# Patient Record
Sex: Male | Born: 1950
Health system: Southern US, Community
[De-identification: ages and names within clinical notes are randomized; demographics above are authoritative.]

## PROBLEM LIST (undated history)

## (undated) DIAGNOSIS — Z972 Presence of dental prosthetic device (complete) (partial): Secondary | ICD-10-CM

## (undated) DIAGNOSIS — R112 Nausea with vomiting, unspecified: Secondary | ICD-10-CM

## (undated) DIAGNOSIS — E669 Obesity, unspecified: Secondary | ICD-10-CM

## (undated) DIAGNOSIS — R972 Elevated prostate specific antigen [PSA]: Secondary | ICD-10-CM

## (undated) DIAGNOSIS — R519 Headache, unspecified: Secondary | ICD-10-CM

## (undated) DIAGNOSIS — F419 Anxiety disorder, unspecified: Secondary | ICD-10-CM

## (undated) DIAGNOSIS — D509 Iron deficiency anemia, unspecified: Secondary | ICD-10-CM

## (undated) DIAGNOSIS — E039 Hypothyroidism, unspecified: Secondary | ICD-10-CM

## (undated) DIAGNOSIS — M1990 Unspecified osteoarthritis, unspecified site: Secondary | ICD-10-CM

## (undated) DIAGNOSIS — K449 Diaphragmatic hernia without obstruction or gangrene: Secondary | ICD-10-CM

## (undated) DIAGNOSIS — Z9889 Other specified postprocedural states: Secondary | ICD-10-CM

## (undated) DIAGNOSIS — R51 Headache: Secondary | ICD-10-CM

## (undated) DIAGNOSIS — I480 Paroxysmal atrial fibrillation: Secondary | ICD-10-CM

## (undated) DIAGNOSIS — I10 Essential (primary) hypertension: Secondary | ICD-10-CM

## (undated) DIAGNOSIS — E785 Hyperlipidemia, unspecified: Secondary | ICD-10-CM

## (undated) DIAGNOSIS — I499 Cardiac arrhythmia, unspecified: Secondary | ICD-10-CM

## (undated) HISTORY — DX: Hypothyroidism, unspecified: E03.9

## (undated) HISTORY — DX: Obesity, unspecified: E66.9

## (undated) HISTORY — DX: Anxiety disorder, unspecified: F41.9

## (undated) HISTORY — DX: Iron deficiency anemia, unspecified: D50.9

## (undated) HISTORY — PX: KNEE ARTHROSCOPY: SHX127

## (undated) HISTORY — PX: COLONOSCOPY WITH ESOPHAGOGASTRODUODENOSCOPY (EGD): SHX5779

## (undated) HISTORY — PX: MULTIPLE TOOTH EXTRACTIONS: SHX2053

## (undated) HISTORY — DX: Diaphragmatic hernia without obstruction or gangrene: K44.9

## (undated) HISTORY — DX: Paroxysmal atrial fibrillation: I48.0

## (undated) HISTORY — PX: JOINT REPLACEMENT: SHX530

## (undated) HISTORY — DX: Hyperlipidemia, unspecified: E78.5

## (undated) HISTORY — DX: Essential (primary) hypertension: I10

## (undated) HISTORY — PX: PROSTATE BIOPSY: SHX241

## (undated) HISTORY — PX: LUNG BIOPSY: SHX232

---

## 2003-03-18 ENCOUNTER — Encounter: Payer: Self-pay | Admitting: Internal Medicine

## 2003-03-18 ENCOUNTER — Ambulatory Visit (HOSPITAL_COMMUNITY): Admission: RE | Admit: 2003-03-18 | Discharge: 2003-03-18 | Payer: Self-pay | Admitting: Internal Medicine

## 2004-03-01 ENCOUNTER — Ambulatory Visit (HOSPITAL_COMMUNITY): Admission: RE | Admit: 2004-03-01 | Discharge: 2004-03-01 | Payer: Self-pay | Admitting: Gastroenterology

## 2004-03-01 LAB — HM COLONOSCOPY

## 2010-08-09 LAB — FECAL OCCULT BLOOD, GUAIAC: Fecal Occult Blood: NEGATIVE

## 2011-01-31 ENCOUNTER — Encounter: Payer: Self-pay | Admitting: Family Medicine

## 2011-01-31 DIAGNOSIS — D649 Anemia, unspecified: Secondary | ICD-10-CM

## 2011-01-31 DIAGNOSIS — E785 Hyperlipidemia, unspecified: Secondary | ICD-10-CM

## 2011-01-31 DIAGNOSIS — I10 Essential (primary) hypertension: Secondary | ICD-10-CM

## 2011-01-31 DIAGNOSIS — N400 Enlarged prostate without lower urinary tract symptoms: Secondary | ICD-10-CM | POA: Insufficient documentation

## 2011-01-31 DIAGNOSIS — R5381 Other malaise: Secondary | ICD-10-CM | POA: Insufficient documentation

## 2011-01-31 DIAGNOSIS — K449 Diaphragmatic hernia without obstruction or gangrene: Secondary | ICD-10-CM

## 2011-01-31 DIAGNOSIS — I776 Arteritis, unspecified: Secondary | ICD-10-CM

## 2011-01-31 DIAGNOSIS — E669 Obesity, unspecified: Secondary | ICD-10-CM

## 2011-01-31 DIAGNOSIS — C61 Malignant neoplasm of prostate: Secondary | ICD-10-CM | POA: Insufficient documentation

## 2011-01-31 DIAGNOSIS — E039 Hypothyroidism, unspecified: Secondary | ICD-10-CM

## 2011-01-31 DIAGNOSIS — D509 Iron deficiency anemia, unspecified: Secondary | ICD-10-CM

## 2011-01-31 NOTE — Progress Notes (Unsigned)
  Subjective:    Patient ID: Troy Acosta, male    DOB: 12/24/1950, 60 y.o.   MRN: [id number]  Hypertension This is a recurrent problem. The current episode started more than 1 year ago. The problem has been gradually worsening since onset. The problem is uncontrolled. Associated symptoms include sweats. Pertinent negatives include no anxiety, blurred vision, chest pain, headaches, malaise/fatigue, neck pain, orthopnea, palpitations, peripheral edema, PND or shortness of breath. There are no associated agents to hypertension. Risk factors for coronary artery disease include dyslipidemia, male gender, sedentary lifestyle and family history. Past treatments include angiotensin blockers. The current treatment provides no improvement. There are no compliance problems.  There is no history of angina, kidney disease, CAD/MI, CVA, heart failure, left ventricular hypertrophy, PVD, renovascular disease, retinopathy or a thyroid problem. There is no history of chronic renal disease, coarctation of the aorta, hyperaldosteronism, hypercortisolism, hyperparathyroidism, a hypertension causing med, pheochromocytoma or sleep apnea.      Review of Systems  Constitutional: Negative for malaise/fatigue.  HENT: Negative for neck pain.   Eyes: Negative for blurred vision.  Respiratory: Negative for shortness of breath.   Cardiovascular: Negative for chest pain, palpitations, orthopnea and PND.  Neurological: Negative for headaches.       Objective:   Physical Exam  Constitutional: He is oriented to person, place, and time. He appears well-developed and well-nourished.  HENT:  Head: Normocephalic.  Nose: Nose normal.  Mouth/Throat: Oropharynx is clear and moist.  Eyes: Conjunctivae are normal.  Neck: Neck supple. No JVD present. No tracheal deviation present. No thyromegaly present.  Cardiovascular: Normal rate, regular rhythm, normal heart sounds and intact distal pulses.  Exam reveals no gallop and no friction  rub.   No murmur heard. Pulmonary/Chest: Effort normal and breath sounds normal. No stridor.  Abdominal: Soft. Bowel sounds are normal. He exhibits no mass. There is no tenderness. There is no guarding.  Musculoskeletal: Normal range of motion.  Lymphadenopathy:    He has no cervical adenopathy.  Neurological: He is alert and oriented to person, place, and time.  Skin: Skin is warm and dry.  Psychiatric: He has a normal mood and affect. His behavior is normal.          Assessment & Plan:

## 2011-01-31 NOTE — Patient Instructions (Signed)
Monitor bps and review them in 2 weeks

## 2013-09-10 ENCOUNTER — Ambulatory Visit (INDEPENDENT_AMBULATORY_CARE_PROVIDER_SITE_OTHER): Payer: BC Managed Care – PPO | Admitting: Family Medicine

## 2013-09-10 ENCOUNTER — Encounter (INDEPENDENT_AMBULATORY_CARE_PROVIDER_SITE_OTHER): Payer: Self-pay

## 2013-09-10 ENCOUNTER — Encounter: Payer: Self-pay | Admitting: Family Medicine

## 2013-09-10 VITALS — BP 146/85 | HR 62 | Temp 99.4°F | Ht 72.0 in | Wt 240.0 lb

## 2013-09-10 DIAGNOSIS — D509 Iron deficiency anemia, unspecified: Secondary | ICD-10-CM | POA: Insufficient documentation

## 2013-09-10 DIAGNOSIS — E039 Hypothyroidism, unspecified: Secondary | ICD-10-CM

## 2013-09-10 DIAGNOSIS — Z0000 Encounter for general adult medical examination without abnormal findings: Secondary | ICD-10-CM

## 2013-09-10 DIAGNOSIS — E785 Hyperlipidemia, unspecified: Secondary | ICD-10-CM

## 2013-09-10 DIAGNOSIS — I10 Essential (primary) hypertension: Secondary | ICD-10-CM

## 2013-09-10 LAB — POCT CBC
Lymph, poc: 1.8 (ref 0.6–3.4)
MCH, POC: 28.2 pg (ref 27–31.2)
MCHC: 31.7 g/dL — AB (ref 31.8–35.4)
MCV: 88.9 fL (ref 80–97)
Platelet Count, POC: 232 10*3/uL (ref 142–424)
RDW, POC: 13.3 %
WBC: 9.4 10*3/uL (ref 4.6–10.2)

## 2013-09-10 LAB — POCT URINALYSIS DIPSTICK
Blood, UA: NEGATIVE
Nitrite, UA: NEGATIVE
Urobilinogen, UA: NEGATIVE
pH, UA: 6

## 2013-09-10 LAB — POCT UA - MICROSCOPIC ONLY
Casts, Ur, LPF, POC: NEGATIVE
Crystals, Ur, HPF, POC: NEGATIVE
Yeast, UA: NEGATIVE

## 2013-09-10 MED ORDER — ROSUVASTATIN CALCIUM 10 MG PO TABS: 10.0000 mg | ORAL_TABLET | Freq: Every day | ORAL | Status: DC

## 2013-09-10 MED ORDER — HYDROCHLOROTHIAZIDE 25 MG PO TABS: 25.0000 mg | ORAL_TABLET | Freq: Every day | ORAL | Status: DC

## 2013-09-10 MED ORDER — POLYSACCHARIDE IRON COMPLEX 150 MG PO CAPS: 150.0000 mg | ORAL_CAPSULE | Freq: Every day | ORAL | Status: DC

## 2013-09-10 MED ORDER — OLMESARTAN MEDOXOMIL 40 MG PO TABS: 40.0000 mg | ORAL_TABLET | Freq: Every day | ORAL | Status: DC

## 2013-09-10 MED ORDER — LEVOTHYROXINE SODIUM 175 MCG PO TABS: 175.0000 ug | ORAL_TABLET | Freq: Every day | ORAL | Status: DC

## 2013-09-10 NOTE — Progress Notes (Signed)
Subjective:    Patient ID: Troy Acosta, male    DOB: [birth year], 62 y.o.   MRN: [id number]  HPI Patient here today for annual physical and follow up on chronic medical problems. Patient has been doing well other than some knee pain. He brings in outside blood pressure readings which are running in the 130s over the 70s. He also brings in outside lab work and specifically all this lab work his last PSA a year ago was 1.4 and this time it is 1.1. We will do additional lab work that was not done through his workplace.     Patient Active Problem List   Diagnosis Date Noted  . Diaphragmatic hernia without mention of obstruction or gangrene   . Unspecified hypothyroidism   . Malaise and fatigue   . Arteritis, unspecified   . Iron deficiency anemia, unspecified   . Obesity   . Hyperplasia of prostate   . Essential hypertension, benign   . Other and unspecified hyperlipidemia    Outpatient Encounter Prescriptions as of 09/10/2013  Medication Sig  . aspirin 81 MG EC tablet Take 81 mg by mouth daily.    . Cholecalciferol (VITAMIN D3) 1000 UNITS CAPS Take 2 capsules by mouth daily.   . hydrochlorothiazide (HYDRODIURIL) 25 MG tablet Take 25 mg by mouth daily. As directed  . iron polysaccharides (NIFEREX) 150 MG capsule Take 150 mg by mouth daily.    Marland Kitchen levothyroxine (SYNTHROID, LEVOTHROID) 175 MCG tablet Take 175 mcg by mouth daily.    Marland Kitchen olmesartan (BENICAR) 40 MG tablet Take 40 mg by mouth daily.    . Omega-3 Fatty Acids (FISH OIL) 1000 MG CAPS Take 1 capsule by mouth daily.    . rosuvastatin (CRESTOR) 10 MG tablet Take 10 mg by mouth daily.      Review of Systems  Constitutional: Negative.   HENT: Negative.   Eyes: Negative.   Respiratory: Negative.   Cardiovascular: Negative.   Gastrointestinal: Negative.   Endocrine: Negative.   Genitourinary: Negative.   Musculoskeletal: Positive for arthralgias (right knee pain (had injection in the past)).  Skin: Negative.     Allergic/Immunologic: Negative.   Neurological: Negative.   Hematological: Negative.   Psychiatric/Behavioral: Negative.        Objective:   Physical Exam  Nursing note and vitals reviewed. Constitutional: He is oriented to person, place, and time. He appears well-developed and well-nourished. No distress.  HENT:  Head: Normocephalic and atraumatic.  Right Ear: External ear normal.  Left Ear: External ear normal.  Mouth/Throat: Oropharynx is clear and moist. No oropharyngeal exudate.  Nasal congestion  Eyes: Conjunctivae and EOM are normal. Pupils are equal, round, and reactive to light. Right eye exhibits no discharge. Left eye exhibits no discharge. No scleral icterus.  Neck: Normal range of motion. Neck supple. No tracheal deviation present. No thyromegaly present.  No carotid bruits  Cardiovascular: Normal rate, regular rhythm, normal heart sounds and intact distal pulses.  Exam reveals no gallop and no friction rub.   No murmur heard. At 72 per minute  Pulmonary/Chest: Effort normal and breath sounds normal. No respiratory distress. He has no wheezes. He has no rales. He exhibits no tenderness.  No axillary adenopathy  Abdominal: Soft. Bowel sounds are normal. He exhibits no mass. There is no tenderness. There is no rebound and no guarding.  Obesity present. No inguinal nodes  Genitourinary: Rectum normal and penis normal. No penile tenderness.  Prostate is slightly enlarged no firm lumps and no rectal  masses. There was no hernia present and external genitalia was within normal limits  Musculoskeletal: Normal range of motion. He exhibits no edema and no tenderness.  Lymphadenopathy:    He has no cervical adenopathy.  Neurological: He is alert and oriented to person, place, and time. He has normal reflexes. No cranial nerve deficit.  Skin: Skin is warm and dry. No rash noted. No erythema. No pallor.  Psychiatric: He has a normal mood and affect. His behavior is normal.  Judgment and thought content normal.   BP 146/85  Pulse 62  Temp(Src) 99.4 F (37.4 C) (Oral)  Ht 6' (1.829 m)  Wt 240 lb (108.863 kg)  BMI 32.54 kg/m2        Assessment & Plan:  1. Essential hypertension, benign - BMP8+EGFR - Hepatic function panel  2. Unspecified hypothyroidism - Thyroid Panel With TSH  3. Other and unspecified hyperlipidemia  4. Annual physical exam - POCT urinalysis dipstick - POCT UA - Microscopic Only - POCT CBC - Vit D  25 hydroxy (rtn osteoporosis monitoring) - BMP8+EGFR - Hepatic function panel - Thyroid Panel With TSH  5. Anemia, iron deficiency   Meds ordered this encounter  Medications  . hydrochlorothiazide (HYDRODIURIL) 25 MG tablet    Sig: Take 25 mg by mouth daily. As directed   Patient Instructions  Continue current medications. Continue good therapeutic lifestyle changes which include good diet and exercise. Fall precautions discussed with patient. Schedule your flu vaccine if you haven't had it yet If you are over 18 years old - you may need Prevnar 13 or the adult Pneumonia vaccine. Check with your insurance regarding getting the Prevnar vaccine Call [phone] and arrange a visit with the orthopedic surgeon at this office in 8 day--Dr. Darrelyn Hillock Work on aggressively losing weight by watching diet and getting more exercise     Nyra Capes MD

## 2013-09-10 NOTE — Addendum Note (Signed)
Addended by: Magdalene River on: 09/10/2013 11:48 AM   Modules accepted: Orders

## 2013-09-10 NOTE — Patient Instructions (Addendum)
Continue current medications. Continue good therapeutic lifestyle changes which include good diet and exercise. Fall precautions discussed with patient. Schedule your flu vaccine if you haven't had it yet If you are over 60 years old - you may need Prevnar 13 or the adult Pneumonia vaccine. Check with your insurance regarding getting the Prevnar vaccine Call [phone] and arrange a visit with the orthopedic surgeon at this office in 8 day--Dr. Darrelyn Hillock Work on aggressively losing weight by watching diet and getting more exercise

## 2013-09-11 LAB — BMP8+EGFR
BUN/Creatinine Ratio: 21 — ABNORMAL HIGH (ref 9–20)
BUN: 19 mg/dL (ref 6–24)
Chloride: 102 mmol/L (ref 97–108)
GFR calc Af Amer: 108 mL/min/{1.73_m2} (ref >59)
Potassium: 4.4 mmol/L (ref 3.5–5.2)

## 2013-09-11 LAB — THYROID PANEL WITH TSH: T4, Total: 9.4 ug/dL (ref 4.5–12.0)

## 2013-09-11 LAB — HEPATIC FUNCTION PANEL
AST: 16 IU/L (ref 0–40)
Albumin: 4.5 g/dL (ref 3.5–5.5)
Alkaline Phosphatase: 64 IU/L (ref 39–117)

## 2013-09-17 ENCOUNTER — Telehealth: Payer: Self-pay | Admitting: *Deleted

## 2013-09-17 NOTE — Telephone Encounter (Signed)
LM TO GO OVER LABS 

## 2013-09-17 NOTE — Telephone Encounter (Signed)
Pt aware of labs  

## 2013-09-18 ENCOUNTER — Other Ambulatory Visit: Payer: Self-pay | Admitting: Orthopedic Surgery

## 2013-09-18 ENCOUNTER — Ambulatory Visit (INDEPENDENT_AMBULATORY_CARE_PROVIDER_SITE_OTHER): Payer: BC Managed Care – PPO

## 2013-09-18 DIAGNOSIS — R52 Pain, unspecified: Secondary | ICD-10-CM

## 2013-09-18 DIAGNOSIS — M25569 Pain in unspecified knee: Secondary | ICD-10-CM

## 2013-10-01 ENCOUNTER — Other Ambulatory Visit: Payer: Self-pay | Admitting: Family Medicine

## 2014-09-17 ENCOUNTER — Encounter: Payer: BC Managed Care – PPO | Admitting: Family Medicine

## 2014-09-22 ENCOUNTER — Ambulatory Visit (INDEPENDENT_AMBULATORY_CARE_PROVIDER_SITE_OTHER): Payer: BC Managed Care – PPO | Admitting: Family Medicine

## 2014-09-22 ENCOUNTER — Encounter (INDEPENDENT_AMBULATORY_CARE_PROVIDER_SITE_OTHER): Payer: Self-pay

## 2014-09-22 ENCOUNTER — Other Ambulatory Visit: Payer: Self-pay | Admitting: Family Medicine

## 2014-09-22 ENCOUNTER — Ambulatory Visit (INDEPENDENT_AMBULATORY_CARE_PROVIDER_SITE_OTHER): Payer: BC Managed Care – PPO

## 2014-09-22 ENCOUNTER — Encounter: Payer: Self-pay | Admitting: Family Medicine

## 2014-09-22 VITALS — BP 159/86 | HR 61 | Temp 97.4°F | Ht 72.0 in | Wt 242.0 lb

## 2014-09-22 DIAGNOSIS — Z0000 Encounter for general adult medical examination without abnormal findings: Secondary | ICD-10-CM

## 2014-09-22 DIAGNOSIS — D509 Iron deficiency anemia, unspecified: Secondary | ICD-10-CM

## 2014-09-22 DIAGNOSIS — E669 Obesity, unspecified: Secondary | ICD-10-CM

## 2014-09-22 DIAGNOSIS — Z1211 Encounter for screening for malignant neoplasm of colon: Secondary | ICD-10-CM

## 2014-09-22 DIAGNOSIS — I10 Essential (primary) hypertension: Secondary | ICD-10-CM

## 2014-09-22 DIAGNOSIS — E039 Hypothyroidism, unspecified: Secondary | ICD-10-CM

## 2014-09-22 DIAGNOSIS — M1711 Unilateral primary osteoarthritis, right knee: Secondary | ICD-10-CM

## 2014-09-22 LAB — POCT UA - MICROSCOPIC ONLY
BACTERIA, U MICROSCOPIC: NEGATIVE
CRYSTALS, UR, HPF, POC: NEGATIVE
Casts, Ur, LPF, POC: NEGATIVE
Mucus, UA: NEGATIVE
WBC, Ur, HPF, POC: NEGATIVE
Yeast, UA: NEGATIVE

## 2014-09-22 LAB — POCT URINALYSIS DIPSTICK
Bilirubin, UA: NEGATIVE
GLUCOSE UA: NEGATIVE
Ketones, UA: NEGATIVE
LEUKOCYTES UA: NEGATIVE
Nitrite, UA: NEGATIVE
PROTEIN UA: NEGATIVE
SPEC GRAV UA: 1.01
Urobilinogen, UA: NEGATIVE
pH, UA: 6.5

## 2014-09-22 MED ORDER — POLYSACCHARIDE IRON COMPLEX 150 MG PO CAPS: 150.0000 mg | ORAL_CAPSULE | Freq: Every day | ORAL | Status: DC

## 2014-09-22 MED ORDER — OLMESARTAN MEDOXOMIL 40 MG PO TABS: 40.0000 mg | ORAL_TABLET | Freq: Every day | ORAL | Status: DC

## 2014-09-22 MED ORDER — LEVOTHYROXINE SODIUM 150 MCG PO TABS: 150.0000 ug | ORAL_TABLET | Freq: Every day | ORAL | Status: DC

## 2014-09-22 MED ORDER — ROSUVASTATIN CALCIUM 10 MG PO TABS: 10.0000 mg | ORAL_TABLET | Freq: Every day | ORAL | Status: DC

## 2014-09-22 MED ORDER — HYDROCHLOROTHIAZIDE 25 MG PO TABS: 25.0000 mg | ORAL_TABLET | Freq: Every day | ORAL | Status: DC

## 2014-09-22 NOTE — Patient Instructions (Addendum)
Continue current medications. Continue good therapeutic lifestyle changes which include good diet and exercise. Fall precautions discussed with patient. If an FOBT was given today- please return it to our front desk. If you are over 61 years old - you may need Prevnar 40 or the adult Pneumonia vaccine.  Flu Shots will be available at our office starting mid- September. Please call and schedule a FLU CLINIC APPOINTMENT.   Continue to work on losing weight Stay active, drink plenty of water and if you use any.supplements, use Stevia. We will call you with a thyroid profile results once those results are available We will try to arrange a colonoscopy with Dr. Zenovia Jarred Continue to follow-up with Dr. Veverly Fells regarding your knee arthritis Continue to monitor blood pressures at home Decrease sodium intake

## 2014-09-22 NOTE — Progress Notes (Signed)
Subjective:    Patient ID: Troy Acosta, male    DOB: 08/31/1951, 63 y.o.   MRN: [id number]  HPI Patient is here today for annual wellness exam and follow up of chronic medical problems. The patient is doing well. He brings in lab work from the outside. This was reviewed with the patient and will be scanned into the record. All the numbers look good after reviewing these. He would like for his thyroid tests to be repeated because this medication has been adjusted and he needs to know that it's now stable. He would like all his prescriptions rewritten and this will be done for him.         Patient Active Problem List   Diagnosis Date Noted  . Anemia, iron deficiency 09/10/2013  . Diaphragmatic hernia without mention of obstruction or gangrene   . Hypothyroidism   . Malaise and fatigue   . Arteritis, unspecified   . Iron deficiency anemia, unspecified   . Obesity   . BPH (benign prostatic hyperplasia)   . Essential hypertension, benign   . Hyperlipidemia    Outpatient Encounter Prescriptions as of 09/22/2014  Medication Sig  . aspirin 81 MG EC tablet Take 81 mg by mouth daily.    . Cholecalciferol (VITAMIN D3) 1000 UNITS CAPS Take 2 capsules by mouth daily.   . hydrochlorothiazide (HYDRODIURIL) 25 MG tablet Take 1 tablet (25 mg total) by mouth daily. As directed  . iron polysaccharides (NIFEREX) 150 MG capsule Take 1 capsule (150 mg total) by mouth daily.  Marland Kitchen levothyroxine (SYNTHROID, LEVOTHROID) 150 MCG tablet Take 150 mcg by mouth daily before breakfast.  . olmesartan (BENICAR) 40 MG tablet Take 1 tablet (40 mg total) by mouth daily.  . Omega-3 Fatty Acids (FISH OIL) 1000 MG CAPS Take 1 capsule by mouth daily.    . rosuvastatin (CRESTOR) 10 MG tablet Take 1 tablet (10 mg total) by mouth daily.  . [DISCONTINUED] levothyroxine (SYNTHROID, LEVOTHROID) 175 MCG tablet Take 1 tablet (175 mcg total) by mouth daily.    Review of Systems  Constitutional: Negative.   HENT: Negative.     Eyes: Negative.   Respiratory: Negative.   Cardiovascular: Negative.   Gastrointestinal: Negative.   Endocrine: Negative.   Genitourinary: Negative.   Musculoskeletal: Positive for arthralgias (right knee pain - seeing Dr Veverly Fells).  Skin: Negative.   Allergic/Immunologic: Negative.   Neurological: Negative.   Hematological: Negative.   Psychiatric/Behavioral: Negative.        Objective:   Physical Exam  Constitutional: He is oriented to person, place, and time. He appears well-developed and well-nourished. No distress.  Pleasant and relaxed and alert  HENT:  Head: Normocephalic and atraumatic.  Right Ear: External ear normal.  Left Ear: External ear normal.  Nose: Nose normal.  Mouth/Throat: Oropharynx is clear and moist. No oropharyngeal exudate.  Eyes: Conjunctivae and EOM are normal. Pupils are equal, round, and reactive to light. Right eye exhibits no discharge. Left eye exhibits no discharge. No scleral icterus.  Neck: Normal range of motion. Neck supple. No thyromegaly present.  Cardiovascular: Normal rate, regular rhythm, normal heart sounds and intact distal pulses.   No murmur heard. At 72/m  Pulmonary/Chest: Effort normal and breath sounds normal. No respiratory distress. He has no wheezes. He has no rales. He exhibits no tenderness.  No axillary adenopathy  Abdominal: Soft. Bowel sounds are normal. He exhibits no mass. There is no tenderness. There is no rebound and no guarding.  No masses or tenderness,  no inguinal adenopathy  Genitourinary: Rectum normal and penis normal.  The prostate is smooth and soft and slightly enlarged. There are no rectal masses. There are no inguinal hernias present. The external genitalia were within normal limits.  Musculoskeletal: Normal range of motion. He exhibits tenderness. He exhibits no edema.  Right knee tenderness and stiffness  Lymphadenopathy:    He has no cervical adenopathy.  Neurological: He is alert and oriented to  person, place, and time. He has normal reflexes. No cranial nerve deficit.  Skin: Skin is warm and dry. No rash noted. No erythema. No pallor.  Psychiatric: He has a normal mood and affect. His behavior is normal. Judgment and thought content normal.  Nursing note and vitals reviewed.  BP 159/86 mmHg  Pulse 61  Temp(Src) 97.4 F (36.3 C) (Oral)  Ht 6' (1.829 m)  Wt 242 lb (109.77 kg)  BMI 32.81 kg/m2  Repeat blood pressure 140/90 right arm sitting large cuff  [facility] reading (PRIMARY) by  Dr. Brunilda Payor x-ray--no active disease                                       Assessment & Plan:  1. Essential hypertension, benign -Continue to monitor blood pressures at home  2. Anemia, iron deficiency  3. Annual physical exam - POCT UA - Microscopic Only - POCT urinalysis dipstick - Urine culture  4. Screen for colon cancer - Ambulatory referral to Gastroenterology  5. Hypothyroidism, unspecified hypothyroidism type - Thyroid Panel With TSH  6. Primary osteoarthritis of right knee  7. Obesity  Patient Instructions  Continue current medications. Continue good therapeutic lifestyle changes which include good diet and exercise. Fall precautions discussed with patient. If an FOBT was given today- please return it to our front desk. If you are over 30 years old - you may need Prevnar 26 or the adult Pneumonia vaccine.  Flu Shots will be available at our office starting mid- September. Please call and schedule a FLU CLINIC APPOINTMENT.   Continue to work on losing weight Stay active, drink plenty of water and if you use any.supplements, use Stevia. We will call you with a thyroid profile results once those results are available We will try to arrange a colonoscopy with Dr. Zenovia Jarred Continue to follow-up with Dr. Veverly Fells regarding your knee arthritis Continue to monitor blood pressures at home Decrease sodium intake    Arrie Senate MD

## 2014-09-23 ENCOUNTER — Telehealth: Payer: Self-pay | Admitting: *Deleted

## 2014-09-23 DIAGNOSIS — R899 Unspecified abnormal finding in specimens from other organs, systems and tissues: Secondary | ICD-10-CM

## 2014-09-23 LAB — URINE CULTURE: ORGANISM ID, BACTERIA: NO GROWTH

## 2014-09-23 LAB — THYROID PANEL WITH TSH
Free Thyroxine Index: 2.8 (ref 1.2–4.9)
T3 UPTAKE RATIO: 29 % (ref 24–39)
T4 TOTAL: 9.8 ug/dL (ref 4.5–12.0)
TSH: 5.1 u[IU]/mL — ABNORMAL HIGH (ref 0.450–4.500)

## 2014-09-23 MED ORDER — LEVOTHYROXINE SODIUM 25 MCG PO TABS: ORAL_TABLET | ORAL | Status: DC

## 2014-09-23 NOTE — Telephone Encounter (Signed)
Patient aware of new medication to be added due to thyroid results.

## 2014-09-23 NOTE — Telephone Encounter (Signed)
Please call to discuss lab results.  Thyroid number is elevated.  Need to add more levothyroxine.  Script was sent in.  Will need to draw blood in 6 weeks to recheck level.

## 2014-09-23 NOTE — Addendum Note (Signed)
Addended by: Shelbie Ammons on: 09/23/2014 11:10 AM   Modules accepted: Orders

## 2014-09-24 ENCOUNTER — Telehealth: Payer: Self-pay | Admitting: *Deleted

## 2014-09-24 NOTE — Telephone Encounter (Signed)
Urine specimen was normal.

## 2014-10-12 ENCOUNTER — Other Ambulatory Visit: Payer: Self-pay | Admitting: Family Medicine

## 2014-10-19 ENCOUNTER — Telehealth: Payer: Self-pay | Admitting: Family Medicine

## 2014-10-19 NOTE — Telephone Encounter (Signed)
Okay to change per Strong notified

## 2014-12-24 ENCOUNTER — Ambulatory Visit (INDEPENDENT_AMBULATORY_CARE_PROVIDER_SITE_OTHER): Payer: BLUE CROSS/BLUE SHIELD | Admitting: Family Medicine

## 2014-12-24 ENCOUNTER — Encounter: Payer: Self-pay | Admitting: Family Medicine

## 2014-12-24 VITALS — BP 147/86 | HR 62 | Temp 97.6°F | Ht 72.0 in | Wt 247.0 lb

## 2014-12-24 DIAGNOSIS — Z8249 Family history of ischemic heart disease and other diseases of the circulatory system: Secondary | ICD-10-CM

## 2014-12-24 DIAGNOSIS — E039 Hypothyroidism, unspecified: Secondary | ICD-10-CM | POA: Diagnosis not present

## 2014-12-24 DIAGNOSIS — R0602 Shortness of breath: Secondary | ICD-10-CM | POA: Diagnosis not present

## 2014-12-24 DIAGNOSIS — E669 Obesity, unspecified: Secondary | ICD-10-CM | POA: Diagnosis not present

## 2014-12-24 LAB — POCT CBC
Granulocyte percent: 61.5 %G (ref 37–80)
HEMATOCRIT: 43.7 % (ref 43.5–53.7)
Hemoglobin: 13.8 g/dL — AB (ref 14.1–18.1)
LYMPH, POC: 2.5 (ref 0.6–3.4)
MCH: 27.8 pg (ref 27–31.2)
MCHC: 31.7 g/dL — AB (ref 31.8–35.4)
MCV: 87.9 fL (ref 80–97)
MPV: 5.5 fL (ref 0–99.8)
POC Granulocyte: 5.1 (ref 2–6.9)
POC LYMPH PERCENT: 30.6 %L (ref 10–50)
Platelet Count, POC: 241 10*3/uL (ref 142–424)
RBC: 4.97 M/uL (ref 4.69–6.13)
RDW, POC: 13.7 %
WBC: 8.3 10*3/uL (ref 4.6–10.2)

## 2014-12-24 NOTE — Patient Instructions (Signed)
The patient has agreed to work with the health stat nurse at work today with his weight He will also come by here and let us get his weight on a monthly basis for the next 3 months at least

## 2014-12-24 NOTE — Progress Notes (Signed)
Subjective:    Patient ID: Troy Acosta, male    DOB: 1951/07/14, 64 y.o.   MRN: [id number]  HPI Patient here today for shortness of breath. This only happens occasionally and usually is resolved by a cough.        Patient Active Problem List   Diagnosis Date Noted  . Anemia, iron deficiency 09/10/2013  . Diaphragmatic hernia without mention of obstruction or gangrene   . Hypothyroidism   . Malaise and fatigue   . Arteritis, unspecified   . Iron deficiency anemia, unspecified   . Obesity   . BPH (benign prostatic hyperplasia)   . Essential hypertension, benign   . Hyperlipidemia    Outpatient Encounter Prescriptions as of 12/24/2014  Medication Sig  . aspirin 81 MG EC tablet Take 81 mg by mouth daily.    . Cholecalciferol (VITAMIN D3) 1000 UNITS CAPS Take 2 capsules by mouth daily.   . hydrochlorothiazide (HYDRODIURIL) 25 MG tablet TAKE ONE TABLET BY MOUTH ONE TIME DAILY  . iron polysaccharides (NIFEREX) 150 MG capsule Take 1 capsule (150 mg total) by mouth daily.  Marland Kitchen levothyroxine (LEVOTHROID) 25 MCG tablet Take 0.5 pill (12.5 mcg) every AM with levothyroxine 150 mcg to total 162.38mg q day.  . levothyroxine (SYNTHROID, LEVOTHROID) 150 MCG tablet Take 1 tablet (150 mcg total) by mouth daily before breakfast.  . olmesartan (BENICAR) 40 MG tablet Take 1 tablet (40 mg total) by mouth daily.  . Omega-3 Fatty Acids (FISH OIL) 1000 MG CAPS Take 1 capsule by mouth daily.    . rosuvastatin (CRESTOR) 10 MG tablet Take 1 tablet (10 mg total) by mouth daily.    Review of Systems  Constitutional: Negative.   HENT: Negative.   Eyes: Negative.   Respiratory: Positive for shortness of breath.   Cardiovascular: Negative.   Gastrointestinal: Negative.   Endocrine: Negative.   Genitourinary: Negative.   Musculoskeletal: Negative.   Skin: Negative.   Allergic/Immunologic: Negative.   Neurological: Negative.   Hematological: Negative.   Psychiatric/Behavioral: Negative.          Objective:   Physical Exam  Constitutional: He is oriented to person, place, and time. He appears well-developed and well-nourished.  HENT:  Head: Normocephalic and atraumatic.  Right Ear: External ear normal.  Left Ear: External ear normal.  Nose: Nose normal.  Mouth/Throat: Oropharynx is clear and moist. No oropharyngeal exudate.  Eyes: Conjunctivae and EOM are normal. Pupils are equal, round, and reactive to light. Right eye exhibits no discharge. Left eye exhibits no discharge. No scleral icterus.  Neck: Normal range of motion. Neck supple. No thyromegaly present.  Cardiovascular: Normal rate, regular rhythm, normal heart sounds and intact distal pulses.  Exam reveals no gallop and no friction rub.   No murmur heard. At 60/m  Pulmonary/Chest: Effort normal and breath sounds normal. No respiratory distress. He has no wheezes. He has no rales. He exhibits no tenderness.  Abdominal: Soft. Bowel sounds are normal. He exhibits no mass. There is no tenderness. There is no rebound and no guarding.  Musculoskeletal: Normal range of motion. He exhibits no edema or tenderness.  Lymphadenopathy:    He has no cervical adenopathy.  Neurological: He is alert and oriented to person, place, and time. He has normal reflexes. No cranial nerve deficit.  Skin: Skin is warm and dry. Rash noted. No erythema. No pallor.  Dry rash on extensor surfaces of elbows  Psychiatric: He has a normal mood and affect. His behavior is normal. Judgment and  thought content normal.  Nursing note and vitals reviewed.  BP 147/86 mmHg  Pulse 62  Temp(Src) 97.6 F (36.4 C) (Oral)  Ht 6' (1.829 m)  Wt 247 lb (112.038 kg)  BMI 33.49 kg/m2  SpO2 96%  PaO2 96% Chest x-ray from December 15 was within normal limits      Assessment & Plan:  1. SOB (shortness of breath) -The patient continues to gain weight. Recently he said he weighed 254 and he's worked on getting it down to 247 pounds. Back in December it was 242 in  December 2014 it was 240. His BMI is 33.4. He understands the necessity for an aggressive weight loss plan. He needs to talk to the health stat nurse and arrange for her to work with him on his diet. - EKG 12-Lead - POCT CBC - Thyroid Panel With TSH - Ambulatory referral to Cardiology - BMP8+EGFR  2. Hypothyroidism, unspecified hypothyroidism type -The patient's thyroid was checked recently and it was within normal limits - Thyroid Panel With TSH - Ambulatory referral to Cardiology - BMP8+EGFR  3. Family history of heart disease -This is a risk factor for the patient and he needs to lose the weight - Ambulatory referral to Cardiology - BMP8+EGFR  4. Obesity -I asked the patient to work on this and he will schedule his visit with the help stat nurse at work and he will come by here monthly for weigh-in.  Patient Instructions  The patient has agreed to work with the health stat nurse at work today with his weight He will also come by here and let us get his weight on a monthly basis for the next 3 months at least   Arrie Senate MD

## 2014-12-25 LAB — NMR, LIPOPROFILE
CHOLESTEROL: 133 mg/dL (ref 100–199)
HDL Cholesterol by NMR: 39 mg/dL — ABNORMAL LOW (ref >39)
HDL Particle Number: 34.1 umol/L (ref >=30.5)
LDL PARTICLE NUMBER: 931 nmol/L (ref <1000)
LDL SIZE: 20.4 nm (ref >20.5)
LDL-C: 66 mg/dL (ref 0–99)
LP-IR Score: 73 — ABNORMAL HIGH (ref <=45)
Small LDL Particle Number: 567 nmol/L — ABNORMAL HIGH (ref <=527)
Triglycerides by NMR: 139 mg/dL (ref 0–149)

## 2014-12-25 LAB — BMP8+EGFR
BUN/Creatinine Ratio: 18 (ref 10–22)
BUN: 17 mg/dL (ref 8–27)
CALCIUM: 9.3 mg/dL (ref 8.6–10.2)
CHLORIDE: 98 mmol/L (ref 97–108)
CO2: 27 mmol/L (ref 18–29)
Creatinine, Ser: 0.96 mg/dL (ref 0.76–1.27)
GFR calc Af Amer: 98 mL/min/{1.73_m2} (ref >59)
GFR calc non Af Amer: 85 mL/min/{1.73_m2} (ref >59)
Glucose: 99 mg/dL (ref 65–99)
Potassium: 3.5 mmol/L (ref 3.5–5.2)
SODIUM: 142 mmol/L (ref 134–144)

## 2014-12-25 LAB — THYROID PANEL WITH TSH
Free Thyroxine Index: 2.5 (ref 1.2–4.9)
T3 Uptake Ratio: 27 % (ref 24–39)
T4, Total: 9.1 ug/dL (ref 4.5–12.0)
TSH: 2.49 u[IU]/mL (ref 0.450–4.500)

## 2014-12-31 ENCOUNTER — Telehealth: Payer: Self-pay | Admitting: *Deleted

## 2014-12-31 NOTE — Telephone Encounter (Signed)
Pt aware of lab results.  Will call back and make 3-4 month appt with [facility] after stress test.

## 2014-12-31 NOTE — Telephone Encounter (Signed)
Lm, call for results.

## 2014-12-31 NOTE — Telephone Encounter (Signed)
-----   Message from Chipper Herb, MD sent at 12/26/2014 12:00 PM EDT ----- The patient should continue with his current iron medication based on the results of the CBC. The thyroid tests were within normal limits. The blood sugar was good at 99. The creatinine, the most important kidney function test was within normal limits. All of the electrolytes including potassium were within normal limits. All cholesterol numbers with advanced lipid testing were excellent and at goal. The patient should continue with his Crestor and Fish oil In all of the blood work there were no abnormalities suggesting why he is more short of breath or could be more fatigued------- he must work on his weight----please talk with the help stat nurse at work and get her to help you with this. Also take a copy of this lab work with you to see her and do not forget to keep you on task, please come by the office monthly for the next 3 or 4 months and weight in so that we can see that you're making an effort to get the weight off

## 2015-01-13 ENCOUNTER — Telehealth: Payer: Self-pay | Admitting: Family Medicine

## 2015-01-13 NOTE — Telephone Encounter (Signed)
Called.

## 2015-02-18 ENCOUNTER — Encounter: Payer: Self-pay | Admitting: Internal Medicine

## 2015-02-18 ENCOUNTER — Ambulatory Visit (INDEPENDENT_AMBULATORY_CARE_PROVIDER_SITE_OTHER): Payer: BLUE CROSS/BLUE SHIELD | Admitting: Internal Medicine

## 2015-02-18 VITALS — BP 160/88 | HR 65 | Ht 71.0 in | Wt 237.9 lb

## 2015-02-18 DIAGNOSIS — E785 Hyperlipidemia, unspecified: Secondary | ICD-10-CM

## 2015-02-18 DIAGNOSIS — R0600 Dyspnea, unspecified: Secondary | ICD-10-CM | POA: Insufficient documentation

## 2015-02-18 DIAGNOSIS — R079 Chest pain, unspecified: Secondary | ICD-10-CM | POA: Diagnosis not present

## 2015-02-18 DIAGNOSIS — I10 Essential (primary) hypertension: Secondary | ICD-10-CM | POA: Diagnosis not present

## 2015-02-18 NOTE — Patient Instructions (Addendum)
Your physician has requested that you have an exercise tolerance test. For further information please visit [email or web address]. Please also follow instruction sheet, as given.  Your physician recommends that you schedule a follow-up appointment As Needed  Exercise Stress Electrocardiogram An exercise stress electrocardiogram is a test that is done to evaluate the blood supply to your heart. This test may also be called exercise stress electrocardiography. The test is done while you are walking on a treadmill. The goal of this test is to raise your heart rate. This test is done to find areas of poor blood flow to the heart by determining the extent of coronary artery disease (CAD).   CAD is defined as narrowing in one or more heart (coronary) arteries of more than 70%. If you have an abnormal test result, this may mean that you are not getting adequate blood flow to your heart during exercise. Additional testing may be needed to understand why your test was abnormal. LET [facility] CARE PROVIDER KNOW ABOUT:   Any allergies you have.  All medicines you are taking, including vitamins, herbs, eye drops, creams, and over-the-counter medicines.  Previous problems you or members of your family have had with the use of anesthetics.  Any blood disorders you have.  Previous surgeries you have had.  Medical conditions you have.  Possibility of pregnancy, if this applies. RISKS AND COMPLICATIONS Generally, this is a safe procedure. However, as with any procedure, complications can occur. Possible complications can include:  Pain or pressure in the following areas:  Chest.  Jaw or neck.  Between your shoulder blades.  Radiating down your left arm.  Dizziness or light-headedness.  Shortness of breath.  Increased or irregular heartbeats.  Nausea or vomiting.  Heart attack (rare). BEFORE THE PROCEDURE  Avoid all forms of caffeine 24 hours before your test or as directed by your health  care provider. This includes coffee, tea (even decaffeinated tea), caffeinated sodas, chocolate, cocoa, and certain pain medicines.  Follow your health care provider's instructions regarding eating and drinking before the test.  Take your medicines as directed at regular times with water unless instructed otherwise. Exceptions may include:  If you have diabetes, ask how you are to take your insulin or pills. It is common to adjust insulin dosing the morning of the test.  If you are taking beta-blocker medicines, it is important to talk to your health care provider about these medicines well before the date of your test. Taking beta-blocker medicines may interfere with the test. In some cases, these medicines need to be changed or stopped 24 hours or more before the test.  If you wear a nitroglycerin patch, it may need to be removed prior to the test. Ask your health care provider if the patch should be removed before the test.  If you use an inhaler for any breathing condition, bring it with you to the test.  If you are an outpatient, bring a snack so you can eat right after the stress phase of the test.  Do not smoke for 4 hours prior to the test or as directed by your health care provider.  Do not apply lotions, powders, creams, or oils on your chest prior to the test.  Wear loose-fitting clothes and comfortable shoes for the test. This test involves walking on a treadmill. PROCEDURE  Multiple patches (electrodes) will be put on your chest. If needed, small areas of your chest may have to be shaved to get better contact with the electrodes.  Once the electrodes are attached to your body, multiple wires will be attached to the electrodes and your heart rate will be monitored.  Your heart will be monitored both at rest and while exercising.  You will walk on a treadmill. The treadmill will be started at a slow pace. The treadmill speed and incline will gradually be increased to raise your  heart rate. AFTER THE PROCEDURE  Your heart rate and blood pressure will be monitored after the test.  You may return to your normal schedule including diet, activities, and medicines, unless your health care provider tells you otherwise. Document Released: 09/22/2000 Document Revised: 09/30/2013 Document Reviewed: 06/02/2013 [facility] Patient Information 2015 [place], [place]. This information is not intended to replace advice given to you by your health care provider. Make sure you discuss any questions you have with your health care provider.

## 2015-02-18 NOTE — Progress Notes (Signed)
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 They did perform an EKG which was normal. There was a suggestion to see a cardiologist. Mr. Karger has history of hypertension and dyslipidemia. There is a family history of heart failure in his mother. He's had no anginal symptoms or worsening shortness of breath or fatigue since this episode. I wonder if he possibly had an episode of atrial fibrillation. He denied any palpitations or fluttering but did feel like a pressure across his chest. He reports good sleep at night and says that his wife does not report he snores or stops breathing.  PMHx:  Past Medical History  Diagnosis Date  . Diaphragmatic hernia without mention of obstruction or gangrene   . Unspecified hypothyroidism   . Malaise and fatigue   . Arteritis, unspecified   . Iron deficiency anemia, unspecified   . Obesity   . Hyperplasia of prostate   . Essential hypertension, benign   . Other and unspecified hyperlipidemia     No past surgical history on file.  FAMHx:  Family History  Problem Relation Age of Onset  . Heart failure Mother   . Hypertension Mother   . Diabetes Mother   . Cancer Father     lung cancer  . COPD Father   . Tuberous sclerosis Father   . Heart disease Sister   . Cancer Brother   . Hypertension Sister   . Thyroid disease Sister   . Crohn's disease Sister     SOCHx:   reports that he quit smoking about 22 years ago. His smoking use included Cigarettes. He has never used smokeless tobacco. He reports that he does not drink alcohol or use illicit drugs.  ALLERGIES:  No Known Allergies  ROS: A comprehensive review of systems was negative except  for: Cardiovascular: positive for chest pressure/discomfort and fatigue  HOME MEDS: Current Outpatient Prescriptions  Medication Sig Dispense Refill  . aspirin 81 MG EC tablet Take 81 mg by mouth daily.      . Cholecalciferol (VITAMIN D3) 1000 UNITS CAPS Take 1 capsule by mouth daily.     . hydrochlorothiazide (HYDRODIURIL) 25 MG tablet TAKE ONE TABLET BY MOUTH ONE TIME DAILY 90 tablet 1  . iron polysaccharides (NIFEREX) 150 MG capsule Take 1 capsule (150 mg total) by mouth daily. 90 capsule 3  . levothyroxine (LEVOTHROID) 25 MCG tablet Take 0.5 pill (12.5 mcg) every AM with levothyroxine 150 mcg to total 162.61mcg q day. 30 tablet 1  . levothyroxine (SYNTHROID, LEVOTHROID) 150 MCG tablet Take 1 tablet (150 mcg total) by mouth daily before breakfast. 90 tablet 3  . olmesartan (BENICAR) 40 MG tablet Take 1 tablet (40 mg total) by mouth daily. 90 tablet 3  . Omega-3 Fatty Acids (FISH OIL) 1000 MG CAPS Take 1 capsule by mouth daily.      . rosuvastatin (CRESTOR) 10 MG tablet Take 1 tablet (10 mg total) by mouth daily. (Patient taking differently: Take 5 mg by mouth daily. ) 90 tablet 3   No current facility-administered medications for this visit.    LABS/IMAGING: No results found for this or any previous visit (from the past 48 hour(s)). No  results found.  WEIGHTS: Wt Readings from Last 3 Encounters:  02/18/15 237 lb 14.4 oz (107.911 kg)  12/24/14 247 lb (112.038 kg)  09/22/14 242 lb (109.77 kg)    VITALS: BP 160/88 mmHg  Pulse 65  Ht 5\' 11"  (1.803 m)  Wt 237 lb 14.4 oz (107.911 kg)  BMI 33.20 kg/m2  EXAM: General appearance: alert and no distress Neck: no carotid bruit and no JVD Lungs: clear to auscultation bilaterally Heart: regular rate and rhythm, S1, S2 normal, no murmur, click, rub or gallop Abdomen: soft, non-tender; bowel sounds normal; no masses,  no organomegaly Extremities: extremities normal, atraumatic, no cyanosis or edema Pulses: 2+ and symmetric Skin: Skin  color, texture, turgor normal. No rashes or lesions Neurologic: Grossly normal Psych: Pleasant  EKG: Normal sinus rhythm at 65  ASSESSMENT: 1. 2 day episode of fatigue, shortness of breath and chest pain 2. Hypertension 3. Dyslipidemia  PLAN: 1.   Mr. Johnston had a 2 day episode of what sounded like fatigue, shortness of breath and chest pain. This episode could be related to atrial fibrillation however he denied any palpitations. There is no clear evidence that he had this and he said not had a recurrence in over 2 months. The symptoms with exertion, however based on his risk factors, stress testing is reasonable. I recommend a treadmill exercise stress test. Should he have further episodes of this, I have advised him to check his pulse and blood pressure during the episode. If it is a regular he should either come to our office or yours to try to get an EKG the same day and demonstrate whether or not he is in A. fib. We could also consider monitoring after that point with a more than reasonable chance of picking up an arrhythmia. Currently there is no evidence to recommend any stronger anticoagulation. He should remain on aspirin.  I'll contact him with the results of the stress test and follow-up with me can be on an as-needed basis.  Pixie Casino, MD, [facility] Attending Cardiologist [place] 02/18/2015, 4:42 PM

## 2015-02-19 ENCOUNTER — Encounter: Payer: Self-pay | Admitting: *Deleted

## 2015-02-22 NOTE — Addendum Note (Signed)
Addended by: Diana Eves on: 02/22/2015 12:58 PM   Modules accepted: Orders

## 2015-03-18 ENCOUNTER — Encounter (HOSPITAL_COMMUNITY): Payer: BLUE CROSS/BLUE SHIELD

## 2015-03-19 ENCOUNTER — Telehealth (HOSPITAL_COMMUNITY): Payer: Self-pay

## 2015-03-19 NOTE — Telephone Encounter (Signed)
Encounter complete. 

## 2015-03-24 ENCOUNTER — Ambulatory Visit (HOSPITAL_COMMUNITY)
Admission: RE | Admit: 2015-03-24 | Discharge: 2015-03-24 | Disposition: A | Discharge status: Home / Self Care | Payer: BLUE CROSS/BLUE SHIELD | Source: Ambulatory Visit | Attending: Internal Medicine | Admitting: Internal Medicine

## 2015-03-24 DIAGNOSIS — R0600 Dyspnea, unspecified: Secondary | ICD-10-CM | POA: Diagnosis not present

## 2015-03-24 DIAGNOSIS — R079 Chest pain, unspecified: Secondary | ICD-10-CM | POA: Diagnosis not present

## 2015-03-24 LAB — EXERCISE TOLERANCE TEST
CSEPED: 12 min
CSEPEDS: 1 s
CSEPEW: 13.7 METS
CSEPPHR: 153 {beats}/min
MPHR: 159 {beats}/min
Percent HR: 96 %
RPE: 15
Rest HR: 61 {beats}/min

## 2015-04-13 ENCOUNTER — Other Ambulatory Visit: Payer: Self-pay | Admitting: Family Medicine

## 2015-06-28 ENCOUNTER — Ambulatory Visit: Payer: BLUE CROSS/BLUE SHIELD | Admitting: Family Medicine

## 2015-06-29 ENCOUNTER — Ambulatory Visit (INDEPENDENT_AMBULATORY_CARE_PROVIDER_SITE_OTHER): Payer: BLUE CROSS/BLUE SHIELD | Admitting: Family Medicine

## 2015-06-29 ENCOUNTER — Encounter: Payer: Self-pay | Admitting: Family Medicine

## 2015-06-29 ENCOUNTER — Encounter (INDEPENDENT_AMBULATORY_CARE_PROVIDER_SITE_OTHER): Payer: Self-pay

## 2015-06-29 VITALS — BP 145/83 | HR 69 | Temp 97.9°F | Ht 71.0 in | Wt 239.0 lb

## 2015-06-29 DIAGNOSIS — I951 Orthostatic hypotension: Secondary | ICD-10-CM | POA: Diagnosis not present

## 2015-06-29 DIAGNOSIS — E860 Dehydration: Secondary | ICD-10-CM

## 2015-06-29 NOTE — Progress Notes (Signed)
Subjective:    Patient ID: Troy Acosta, male    DOB: 1951-09-03, 64 y.o.   MRN: [id number]  HPI Patient here today for fluctuation of BP. He had several episodes of low BP and weak / dizzy spells this weekend. The patient has had recent lab work done and all of the blood work was excellent including his cholesterol numbers his blood sugar his creatinine electrolytes and liver function test hemoglobin and white blood cell count and platelet count. Also his thyroid profile was within normal limits. We have a copy of this and will be scanned into the record. He also had a PSA test which was 1.6. His thyroid test specifically for the TSH was 2.54. Vitamin D level was at the low end of the normal range. During this episode over the weekend he recalls having worked outside and after working outside coming in and laying down and getting off the couch and feeling this lightheadedness through the weekend. He is a profuse sweater. The blood pressure readings that were recorded over the weekend will be scanned into the record. During all this he did deny chest pain shortness of breath trouble swallowing and heartburn indigestion nausea vomiting or diarrhea. He has a history of iron deficiency anemia and takes iron for this and his stools are always dark in color. Today's blood pressure readings are good.      Patient Active Problem List   Diagnosis Date Noted  . Pain in the chest 02/18/2015  . Dyspnea 02/18/2015  . Anemia, iron deficiency 09/10/2013  . Diaphragmatic hernia without mention of obstruction or gangrene   . Hypothyroidism   . Malaise and fatigue   . Arteritis, unspecified   . Iron deficiency anemia, unspecified   . Obesity   . BPH (benign prostatic hyperplasia)   . Essential hypertension, benign   . Hyperlipidemia    Outpatient Encounter Prescriptions as of 06/29/2015  Medication Sig  . aspirin 81 MG EC tablet Take 81 mg by mouth daily.    . Cholecalciferol (VITAMIN D3) 1000 UNITS CAPS  Take 1 capsule by mouth daily.   . hydrochlorothiazide (HYDRODIURIL) 25 MG tablet TAKE ONE TABLET BY MOUTH ONE TIME DAILY  . iron polysaccharides (NIFEREX) 150 MG capsule Take 1 capsule (150 mg total) by mouth daily.  Marland Kitchen levothyroxine (LEVOTHROID) 25 MCG tablet Take 0.5 pill (12.5 mcg) every AM with levothyroxine 150 mcg to total 162.49mcg q day.  . levothyroxine (SYNTHROID, LEVOTHROID) 150 MCG tablet Take 1 tablet (150 mcg total) by mouth daily before breakfast.  . olmesartan (BENICAR) 40 MG tablet Take 1 tablet (40 mg total) by mouth daily.  . Omega-3 Fatty Acids (FISH OIL) 1000 MG CAPS Take 1 capsule by mouth daily.    . rosuvastatin (CRESTOR) 10 MG tablet Take 1 tablet (10 mg total) by mouth daily. (Patient taking differently: Take 5 mg by mouth daily. )   No facility-administered encounter medications on file as of 06/29/2015.      Review of Systems  Constitutional: Negative.   HENT: Negative.   Eyes: Negative.   Respiratory: Negative.   Cardiovascular: Negative.   Gastrointestinal: Negative.   Endocrine: Negative.   Genitourinary: Negative.   Musculoskeletal: Negative.   Skin: Negative.   Allergic/Immunologic: Negative.   Neurological: Positive for light-headedness (over weekend - low BP).  Hematological: Negative.   Psychiatric/Behavioral: Negative.        Objective:   Physical Exam  Constitutional: He is oriented to person, place, and time. He appears well-developed  and well-nourished. No distress.  HENT:  Head: Normocephalic and atraumatic.  Right Ear: External ear normal.  Left Ear: External ear normal.  Mouth/Throat: Oropharynx is clear and moist. No oropharyngeal exudate.  Nasal congestion  Eyes: Conjunctivae and EOM are normal. Pupils are equal, round, and reactive to light. Right eye exhibits no discharge. Left eye exhibits no discharge. No scleral icterus.  Neck: Normal range of motion. Neck supple. No thyromegaly present.  No bruits  Cardiovascular: Normal  rate, regular rhythm and normal heart sounds.  Exam reveals no gallop and no friction rub.   No murmur heard. At 72/m  Pulmonary/Chest: Effort normal and breath sounds normal. No respiratory distress. He has no wheezes. He has no rales. He exhibits no tenderness.  Clear anteriorly and posteriorly  Abdominal: Soft. Bowel sounds are normal. He exhibits no mass. There is no tenderness. There is no rebound and no guarding.  No tenderness or organ enlargement  Musculoskeletal: Normal range of motion. He exhibits no edema or tenderness.  Lymphadenopathy:    He has no cervical adenopathy.  Neurological: He is alert and oriented to person, place, and time. He has normal reflexes. No cranial nerve deficit.  Skin: Skin is warm and dry. No rash noted.  Psychiatric: He has a normal mood and affect. His behavior is normal. Judgment and thought content normal.  Nursing note and vitals reviewed.  BP 145/83 mmHg  Pulse 69  Temp(Src) 97.9 F (36.6 C) (Oral)  Ht 5\' 11"  (1.803 m)  Wt 239 lb (108.41 kg)  BMI 33.35 kg/m2        Assessment & Plan:  1. Orthostatic hypotension -This may have occurred over the weekend secondary to dehydration. Subsequent blood pressures have been good by him at home and by [place] here in the office. His readings correlated well on his machine with our machine.  2. Dehydration -The patient had been outside working for several hours. This occurred after that. He says he drinks plenty of fluids and tried to that particular day but may not have.  Patient Instructions  The patient's recent blood work from his workplace was reviewed with him today and he understands all this information. He will increase his vitamin D3, 1000-2 on Saturday and Sunday and 1 Monday through Friday He will monitor his pulse if he has another lightheaded spell as especially check for its regularity. He was instructed in feeling his radial pulse while he was in the office and knows how to do  this.   Arrie Senate MD

## 2015-06-29 NOTE — Patient Instructions (Signed)
The patient's recent blood work from his workplace was reviewed with him today and he understands all this information. He will increase his vitamin D3, 1000-2 on Saturday and Sunday and 1 Monday through Friday He will monitor his pulse if he has another lightheaded spell as especially check for its regularity. He was instructed in feeling his radial pulse while he was in the office and knows how to do this.

## 2015-07-08 ENCOUNTER — Ambulatory Visit (INDEPENDENT_AMBULATORY_CARE_PROVIDER_SITE_OTHER): Payer: BLUE CROSS/BLUE SHIELD | Admitting: Internal Medicine

## 2015-07-08 ENCOUNTER — Ambulatory Visit (INDEPENDENT_AMBULATORY_CARE_PROVIDER_SITE_OTHER): Payer: BLUE CROSS/BLUE SHIELD

## 2015-07-08 ENCOUNTER — Encounter: Payer: Self-pay | Admitting: Internal Medicine

## 2015-07-08 VITALS — BP 140/90 | HR 78 | Ht 71.0 in | Wt 241.3 lb

## 2015-07-08 DIAGNOSIS — R5381 Other malaise: Secondary | ICD-10-CM

## 2015-07-08 DIAGNOSIS — E785 Hyperlipidemia, unspecified: Secondary | ICD-10-CM

## 2015-07-08 DIAGNOSIS — I10 Essential (primary) hypertension: Secondary | ICD-10-CM | POA: Diagnosis not present

## 2015-07-08 DIAGNOSIS — R002 Palpitations: Secondary | ICD-10-CM

## 2015-07-08 DIAGNOSIS — R5383 Other fatigue: Secondary | ICD-10-CM | POA: Diagnosis not present

## 2015-07-08 NOTE — Patient Instructions (Signed)
Your Doctor has ordered you to wear a heart monitor. You will wear this for 30 days.   Your physician recommends that you schedule a follow-up appointment after your monitor.   TIPS -  REMINDERS 1. The sensor is the lanyard that is worn around your neck every day - this is powered by a battery that needs to be changed every day 2. The monitor is the device that allows you to record symptoms - this will need to be charged daily 3. The sensor & monitor need to be within 100 feet of each other at all times 4. The sensor connects to the electrodes (stickers) - these should be changed every 24-48 hours (you do not have to remove them when you bathe, just make sure they are dry when you connect it back to the sensor 5. If you need more supplies (electrodes, batteries), please call the 1-800 # on the back of the pamphlet and [employer] will mail you more supplies 6. If your skin becomes sensitive, please try the sample pack of sensitive skin electrodes (the white packet in your silver box) and call [employer] to have them mail you more of these type of electrodes 7. When you are finish wearing the monitor, please place all supplies back in the silver box, place the silver box in the pre-packaged UPS bag and drop off at [employer] or call them so they can come pick it up   Cardiac Event Monitoring A cardiac event monitor is a small recording device used to help detect abnormal heart rhythms (arrhythmias). The monitor is used to record heart rhythm when noticeable symptoms such as the following occur:  Fast heartbeats (palpitations), such as heart racing or fluttering.  Dizziness.  Fainting or light-headedness.  Unexplained weakness. The monitor is wired to two electrodes placed on your chest. Electrodes are flat, sticky disks that attach to your skin. The monitor can be worn for up to 30 days. You will wear the monitor at all times, except when bathing.  HOW TO USE YOUR CARDIAC EVENT MONITOR A technician  will prepare your chest for the electrode placement. The technician will show you how to place the electrodes, how to work the monitor, and how to replace the batteries. Take time to practice using the monitor before you leave the office. Make sure you understand how to send the information from the monitor to your health care Troy Acosta. This requires a telephone with a landline, not a cell phone. You need to:  Wear your monitor at all times, except when you are in water:  Do not get the monitor wet.  Take the monitor off when bathing. Do not swim or use a hot tub with it on.  Keep your skin clean. Do not put body lotion or moisturizer on your chest.  Change the electrodes daily or any time they stop sticking to your skin. You might need to use tape to keep them on.  It is possible that your skin under the electrodes could become irritated. To keep this from happening, try to put the electrodes in slightly different places on your chest. However, they must remain in the area under your left breast and in the upper right section of your chest.  Make sure the monitor is safely clipped to your clothing or in a location close to your body that your health care Troy Acosta recommends.  Press the button to record when you feel symptoms of heart trouble, such as dizziness, weakness, light-headedness, palpitations, thumping, shortness  of breath, unexplained weakness, or a fluttering or racing heart. The monitor is always on and records what happened slightly before you pressed the button, so do not worry about being too late to get good information.  Keep a diary of your activities, such as walking, doing chores, and taking medicine. It is especially important to note what you were doing when you pushed the button to record your symptoms. This will help your health care Troy Acosta determine what might be contributing to your symptoms. The information stored in your monitor will be reviewed by your health care  Troy Acosta alongside your diary entries.  Send the recorded information as recommended by your health care Troy Acosta. It is important to understand that it will take some time for your health care Troy Acosta to process the results.  Change the batteries as recommended by your health care Troy Acosta. SEEK IMMEDIATE MEDICAL CARE IF:   You have chest pain.  You have extreme difficulty breathing or shortness of breath.  You develop a very fast heartbeat that persists.  You develop dizziness that does not go away.  You faint or constantly feel you are about to faint. Document Released: 07/04/2008 Document Revised: 02/09/2014 Document Reviewed: 03/24/2013 [facility] Patient Information 2015 [place], [place]. This information is not intended to replace advice given to you by your health care Troy Acosta. Make sure you discuss any questions you have with your health care Troy Acosta.

## 2015-07-11 DIAGNOSIS — R002 Palpitations: Secondary | ICD-10-CM | POA: Insufficient documentation

## 2015-07-11 NOTE — Progress Notes (Signed)
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 They did perform an EKG which was normal. There was a suggestion to see a cardiologist. Troy Acosta has history of hypertension and dyslipidemia. There is a family history of heart failure in his mother. He's had no anginal symptoms or worsening shortness of breath or fatigue since this episode. I wonder if he possibly had an episode of atrial fibrillation. He denied any palpitations or fluttering but did feel like a pressure across his chest. He reports good sleep at night and says that his wife does not report he snores or stops breathing.  Troy Acosta returns today for follow-up. He reports some episodes of palpitations that continue to occur and are associated with fatigue. He denies chest pain. He also had some nausea with the last episode.  PMHx:  Past Medical History  Diagnosis Date  . Diaphragmatic hernia without mention of obstruction or gangrene   . Unspecified hypothyroidism   . Malaise and fatigue   . Arteritis, unspecified   . Iron deficiency anemia, unspecified   . Obesity   . Hyperplasia of prostate   . Essential hypertension, benign   . Other and unspecified hyperlipidemia     No past surgical history on file.  FAMHx:  Family History  Problem Relation Age of Onset  . Heart failure Mother   . Hypertension Mother   . Diabetes Mother   . Lung cancer Father   . COPD Father   . Tuberous sclerosis Father   . Heart disease Sister   . Cancer Brother   . Hypertension Sister   . Thyroid disease Sister   . Crohn's disease Sister     SOCHx:   reports that he quit smoking about 22 years ago. His smoking use  included Cigarettes. He has never used smokeless tobacco. He reports that he does not drink alcohol or use illicit drugs.  ALLERGIES:  No Known Allergies  ROS: A comprehensive review of systems was negative except for: Cardiovascular: positive for fatigue and palpitations Gastrointestinal: positive for nausea  HOME MEDS: Current Outpatient Prescriptions  Medication Sig Dispense Refill  . aspirin 81 MG EC tablet Take 81 mg by mouth daily.      . Cholecalciferol (VITAMIN D3) 1000 UNITS CAPS Take 1 capsule by mouth daily.     . hydrochlorothiazide (HYDRODIURIL) 25 MG tablet TAKE ONE TABLET BY MOUTH ONE TIME DAILY 90 tablet 0  . iron polysaccharides (NIFEREX) 150 MG capsule Take 1 capsule (150 mg total) by mouth daily. 90 capsule 3  . levothyroxine (LEVOTHROID) 25 MCG tablet Take 0.5 pill (12.5 mcg) every AM with levothyroxine 150 mcg to total 162.65mcg q day. 30 tablet 1  . levothyroxine (SYNTHROID, LEVOTHROID) 150 MCG tablet Take 1 tablet (150 mcg total) by mouth daily before breakfast. 90 tablet 3  . olmesartan (BENICAR) 40 MG tablet Take 1 tablet (40 mg total) by mouth daily. 90 tablet 3  . Omega-3 Fatty Acids (FISH OIL) 1000 MG CAPS Take 1 capsule by mouth daily.      . rosuvastatin (CRESTOR) 10 MG tablet Take 1 tablet (10 mg total) by mouth daily. (Patient taking differently: Take 5 mg  by mouth daily. ) 90 tablet 3   No current facility-administered medications for this visit.    LABS/IMAGING: No results found for this or any previous visit (from the past 48 hour(s)). No results found.  WEIGHTS: Wt Readings from Last 3 Encounters:  07/08/15 241 lb 4.8 oz (109.453 kg)  06/29/15 239 lb (108.41 kg)  02/18/15 237 lb 14.4 oz (107.911 kg)    VITALS: BP 140/90 mmHg  Pulse 78  Ht 5\' 11"  (1.803 m)  Wt 241 lb 4.8 oz (109.453 kg)  BMI 33.67 kg/m2  EXAM: General appearance: alert and no distress Neck: no carotid bruit and no JVD Lungs: clear to auscultation bilaterally Heart:  regular rate and rhythm, S1, S2 normal, no murmur, click, rub or gallop Abdomen: soft, non-tender; bowel sounds normal; no masses,  no organomegaly Extremities: extremities normal, atraumatic, no cyanosis or edema Pulses: 2+ and symmetric Skin: Skin color, texture, turgor normal. No rashes or lesions Neurologic: Grossly normal Psych: Pleasant  EKG: Normal sinus rhythm at 78  ASSESSMENT: 1. Palpitations 2. Fatigue, shortness of breath and chest pain 3. Hypertension 4. Dyslipidemia  PLAN: 1.   Troy Acosta is having recurrent palpitations and fatigue associated with them. I'm concerned about PAF. I'm going to arrange for a 30 day monitor today. He has had 2 recent events over the past month, so they are not happening frequently.  Plan to see him back in about 6 weeks.  Pixie Casino, MD, [facility] Attending Cardiologist [place] C Jeffrie Lofstrom 07/11/2015, 8:53 PM

## 2015-07-13 ENCOUNTER — Telehealth: Payer: Self-pay | Admitting: Internal Medicine

## 2015-07-13 MED ORDER — RIVAROXABAN 20 MG PO TABS: 20.0000 mg | ORAL_TABLET | Freq: Every day | ORAL | Status: DC

## 2015-07-13 NOTE — Telephone Encounter (Signed)
Returned call to patient's wife.Stated husband had a episode of AFib last night.Stated she called EMS and AFib was confirmed with rate 140.Stated [facility] never called she questioned if monitor he is wearing is working.Troy Acosta Dr.Hilty's nurse called [facility] AFib was confirmed with rate 190.They will fax EKG strips.Stated husband is feeling ok this morning heart rate in 80's.Spoke to Dr.Hilty he advised to stop aspirin and start Xarelto 20 mg daily and keep appointment with him this Thurs 10/6 at 2:15 pm.Wife stated she wanted me to speak to husband about starting Xarelto. Stated he is at work,she will have him call me.

## 2015-07-13 NOTE — Telephone Encounter (Signed)
Received a call from patient.He stated he would like to start coumadin and not xarelto.Stated he has heard bad news about xarelto.Spoke to Dr.Hilty he advised with coumadin takes 5 days to become therapeutic and with xarelto or eliquis you have immediate protection.Patient stated he will take xarelto 20 mg daily until appointment with Dr.HIlty this Thurs 07/15/15 at 2:15 pm.Prescription sent to pharmacy.Advised to stop aspirin.

## 2015-07-13 NOTE — Telephone Encounter (Signed)
Pam is calling because Troy Acosta went into AFIB on Last night. (appt has been moved up to Thurs 07/15/15 ) . Please call  Thanks

## 2015-07-14 ENCOUNTER — Other Ambulatory Visit: Payer: Self-pay | Admitting: Family Medicine

## 2015-07-15 ENCOUNTER — Encounter: Payer: Self-pay | Admitting: Internal Medicine

## 2015-07-15 ENCOUNTER — Ambulatory Visit (INDEPENDENT_AMBULATORY_CARE_PROVIDER_SITE_OTHER): Payer: BLUE CROSS/BLUE SHIELD | Admitting: Internal Medicine

## 2015-07-15 ENCOUNTER — Telehealth: Payer: Self-pay | Admitting: *Deleted

## 2015-07-15 VITALS — BP 124/76 | HR 65 | Ht 71.0 in | Wt 239.1 lb

## 2015-07-15 DIAGNOSIS — I480 Paroxysmal atrial fibrillation: Secondary | ICD-10-CM

## 2015-07-15 DIAGNOSIS — I10 Essential (primary) hypertension: Secondary | ICD-10-CM | POA: Diagnosis not present

## 2015-07-15 MED ORDER — DILTIAZEM HCL ER BEADS 120 MG PO CP24: 120.0000 mg | ORAL_CAPSULE | Freq: Every day | ORAL | Status: DC

## 2015-07-15 MED ORDER — RIVAROXABAN 20 MG PO TABS: 20.0000 mg | ORAL_TABLET | Freq: Every day | ORAL | Status: DC

## 2015-07-15 NOTE — Patient Instructions (Addendum)
Your physician has recommended you make the following change in your medication...  1. DECREASE benicar to  20mg  once daily 2. START diltiazem 120mg  once daily  Keep appointment with Dr. Debara Pickett 08/05/2015  Samples of xarelto 20mg  #21, copay card, free 30 day card given to patient

## 2015-07-15 NOTE — Progress Notes (Signed)
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 They did perform an EKG which was normal. There was a suggestion to see a cardiologist. Troy Acosta has history of hypertension and dyslipidemia. There is a family history of heart failure in his mother. He's had no anginal symptoms or worsening shortness of breath or fatigue since this episode. I wonder if he possibly had an episode of atrial fibrillation. He denied any palpitations or fluttering but did feel like a pressure across his chest. He reports good sleep at night and says that his wife does not report he snores or stops breathing.  Troy Acosta returns today for follow-up. I recently placed him on a monitor and this did confirm my suspicion that he has proximal atrial fibrillation. In fact he had an episode at rest where his heart rate went up to almost 180. This eventually subsided without therapy and he did call EMS to capture the A. fib but he declined going to the hospital. He did call the office and we advised she start on anticoagulation. Today's back in sinus rhythm with a controlled rate.  PMHx:  Past Medical History  Diagnosis Date  . Diaphragmatic hernia without mention of obstruction or gangrene   . Unspecified hypothyroidism   . Malaise and fatigue   . Arteritis, unspecified ([place])   . Iron deficiency anemia, unspecified   . Obesity   . Hyperplasia of prostate   . Essential hypertension, benign   . Other and unspecified hyperlipidemia     No past surgical history on file.  FAMHx:  Family History  Problem Relation Age of Onset  . Heart failure Mother   . Hypertension Mother   . Diabetes Mother   . Lung cancer Father   . COPD Father   .  Tuberous sclerosis Father   . Heart disease Sister   . Cancer Brother   . Hypertension Sister   . Thyroid disease Sister   . Crohn's disease Sister     SOCHx:   reports that he quit smoking about 22 years ago. His smoking use included Cigarettes. He has never used smokeless tobacco. He reports that he does not drink alcohol or use illicit drugs.  ALLERGIES:  No Known Allergies  ROS: A comprehensive review of systems was negative except for: Cardiovascular: positive for fatigue and palpitations Gastrointestinal: positive for nausea  HOME MEDS: Current Outpatient Prescriptions  Medication Sig Dispense Refill  . olmesartan (BENICAR) 20 MG tablet Take 20 mg by mouth daily.    . Cholecalciferol (VITAMIN D3) 1000 UNITS CAPS Take 1 capsule by mouth daily.     Marland Kitchen diltiazem (TIAZAC) 120 MG 24 hr capsule Take 1 capsule (120 mg total) by mouth daily. 30 capsule 6  . hydrochlorothiazide (HYDRODIURIL) 25 MG tablet TAKE ONE TABLET BY MOUTH ONE TIME DAILY 90 tablet 0  . iron polysaccharides (NIFEREX) 150 MG capsule Take 1 capsule (150 mg total) by mouth daily. 90 capsule 3  . levothyroxine (LEVOTHROID) 25 MCG tablet Take 0.5 pill (12.5 mcg) every AM with levothyroxine 150 mcg to total 162.27mcg q day. 30 tablet 1  . levothyroxine (SYNTHROID, LEVOTHROID) 150 MCG tablet Take 1 tablet (150 mcg total) by mouth  daily before breakfast. 90 tablet 3  . Omega-3 Fatty Acids (FISH OIL) 1000 MG CAPS Take 1 capsule by mouth daily.      . rivaroxaban (XARELTO) 20 MG TABS tablet Take 1 tablet (20 mg total) by mouth daily with supper. 30 tablet 6  . rosuvastatin (CRESTOR) 10 MG tablet Take 1 tablet (10 mg total) by mouth daily. (Patient taking differently: Take 5 mg by mouth daily. ) 90 tablet 3   No current facility-administered medications for this visit.    LABS/IMAGING: No results found for this or any previous visit (from the past 48 hour(s)). No results found.  WEIGHTS: Wt Readings from Last 3  Encounters:  07/15/15 239 lb 1.6 oz (108.455 kg)  07/08/15 241 lb 4.8 oz (109.453 kg)  06/29/15 239 lb (108.41 kg)    VITALS: BP 124/76 mmHg  Pulse 65  Ht 5\' 11"  (1.803 m)  Wt 239 lb 1.6 oz (108.455 kg)  BMI 33.36 kg/m2  EXAM: Deferred  EKG: Normal sinus rhythm at 65  ASSESSMENT: 1. Paroxysmal atrial fibrillation - CHADSVASC score of 1 2. Fatigue, shortness of breath and chest pain 3. Hypertension 4. Dyslipidemia  PLAN: 1.   Troy Acosta is having recurrent palpitations and fatigue associated with them. His monitor shows paroxysmal atrial fibrillation. His CHADSVASC score is 1, however in the short-term he would benefit from anticoagulation. There is quite a lot of debate about whether or not he could adequately be managed with aspirin versus a direct oral anticoagulant given his CHADSVASC score. My feeling is if he tolerates Xarelto, will continue that indefinitely. He will also need to be on medication for rate control. I recommend starting Cardizem CD 120 mg daily and decreasing his Benicar from 40-20 mg daily to avoid periods of hypotension which is had recently.  Plan to see him back in one month. He can certainly discontinue wearing his monitor at this point.  Pixie Casino, MD, [facility] Attending Cardiologist [place] 07/15/2015, 3:04 PM

## 2015-07-15 NOTE — Telephone Encounter (Signed)
[facility]ed [facility] - [place] for xarelto approved 06/15/2015 until 07/14/2016 ID: [id number]

## 2015-07-28 ENCOUNTER — Encounter: Payer: Self-pay | Admitting: Family Medicine

## 2015-07-29 ENCOUNTER — Encounter: Payer: Self-pay | Admitting: Internal Medicine

## 2015-07-29 ENCOUNTER — Ambulatory Visit (INDEPENDENT_AMBULATORY_CARE_PROVIDER_SITE_OTHER): Payer: BLUE CROSS/BLUE SHIELD | Admitting: Internal Medicine

## 2015-07-29 VITALS — BP 120/82 | HR 57 | Ht 71.0 in | Wt 239.0 lb

## 2015-07-29 DIAGNOSIS — R5383 Other fatigue: Secondary | ICD-10-CM | POA: Diagnosis not present

## 2015-07-29 DIAGNOSIS — I480 Paroxysmal atrial fibrillation: Secondary | ICD-10-CM

## 2015-07-29 DIAGNOSIS — R5381 Other malaise: Secondary | ICD-10-CM

## 2015-07-29 MED ORDER — METOPROLOL TARTRATE 25 MG PO TABS: 12.5000 mg | ORAL_TABLET | Freq: Two times a day (BID) | ORAL | Status: DC

## 2015-07-29 MED ORDER — FLECAINIDE ACETATE 50 MG PO TABS: 50.0000 mg | ORAL_TABLET | Freq: Two times a day (BID) | ORAL | Status: DC

## 2015-07-29 NOTE — Patient Instructions (Signed)
Your physician has recommended you make the following change in your medication...  1. STOP diltiazem  2. START flecainide 50mg  every 12 hours 3. START metoprolol tartrate 12.5mg  every 12 hours (you will have to cut the 25mg  tab in half)  You will need a NURSE VISIT for an EKG next Monday 10/24 or Tuesday 10/25  Your physician recommends that you schedule a follow-up appointment in: 1 month with Dr. Debara Pickett

## 2015-07-29 NOTE — Progress Notes (Signed)
OFFICE NOTE  Chief Complaint:  Follow-up A. 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 They did perform an EKG which was normal. There was a suggestion to see a cardiologist. Troy Acosta has history of hypertension and dyslipidemia. There is a family history of heart failure in his mother. He's had no anginal symptoms or worsening shortness of breath or fatigue since this episode. I wonder if he possibly had an episode of atrial fibrillation. He denied any palpitations or fluttering but did feel like a pressure across his chest. He reports good sleep at night and says that his wife does not report he snores or stops breathing.  Troy Acosta returns today for follow-up. I recently placed him on a monitor and this did confirm my suspicion that he has proximal atrial fibrillation. In fact he had an episode at rest where his heart rate went up to almost 180. This eventually subsided without therapy and he did call EMS to capture the A. fib but he declined going to the hospital. He did call the office and we advised she start on anticoagulation. Today's back in sinus rhythm with a controlled rate.  I saw Troy Acosta back in the office today for follow-up. His last office visit I started him on diltiazem for rate control. He reports he's had 2 other episodes of A. fib both of which cause significant fatigue although his heart rate was not elevated above 100 he was very symptomatic. He said the next day he felt very tired and is generally very uncomfortable with his A. fib. Despite good rate control, it seems that he is in need of rhythm control. He has had some intermittent episodes of chest discomfort and we did do an exercise treadmill stress test a few months ago which was  negative for ischemia.  PMHx:  Past Medical History  Diagnosis Date  . Diaphragmatic hernia without mention of obstruction or gangrene   . Unspecified hypothyroidism   . Malaise and fatigue   . Arteritis, unspecified ([place])   . Iron deficiency anemia, unspecified   . Obesity   . Hyperplasia of prostate   . Essential hypertension, benign   . Other and unspecified hyperlipidemia     No past surgical history on file.  FAMHx:  Family History  Problem Relation Age of Onset  . Heart failure Mother   . Hypertension Mother   . Diabetes Mother   . Lung cancer Father   . COPD Father   . Tuberous sclerosis Father   . Heart disease Sister   . Cancer Brother   . Hypertension Sister   . Thyroid disease Sister   . Crohn's disease Sister     SOCHx:   reports that he quit smoking about 22 years ago. His smoking use included Cigarettes. He has never used smokeless tobacco. He reports that he does not drink alcohol or use illicit drugs.  ALLERGIES:  No Known Allergies  ROS: A comprehensive review of systems was negative except for: Cardiovascular: positive for fatigue and palpitations Gastrointestinal: positive for nausea  HOME MEDS: Current Outpatient Prescriptions  Medication Sig Dispense Refill  . Cholecalciferol (VITAMIN D3) 1000 UNITS CAPS Take 1 capsule by mouth daily.     . hydrochlorothiazide (  HYDRODIURIL) 25 MG tablet TAKE ONE TABLET BY MOUTH ONE TIME DAILY 90 tablet 0  . iron polysaccharides (NIFEREX) 150 MG capsule Take 1 capsule (150 mg total) by mouth daily. 90 capsule 3  . levothyroxine (LEVOTHROID) 25 MCG tablet Take 0.5 pill (12.5 mcg) every AM with levothyroxine 150 mcg to total 162.51mcg q day. 30 tablet 1  . levothyroxine (SYNTHROID, LEVOTHROID) 150 MCG tablet Take 1 tablet (150 mcg total) by mouth daily before breakfast. 90 tablet 3  . olmesartan (BENICAR) 20 MG tablet Take 20 mg by mouth daily.    . Omega-3 Fatty Acids (FISH OIL) 1000 MG CAPS Take 1 capsule by  mouth daily.      . rivaroxaban (XARELTO) 20 MG TABS tablet Take 1 tablet (20 mg total) by mouth daily with supper. 30 tablet 6  . rosuvastatin (CRESTOR) 10 MG tablet Take 1 tablet (10 mg total) by mouth daily. (Patient taking differently: Take 5 mg by mouth daily. ) 90 tablet 3  . flecainide (TAMBOCOR) 50 MG tablet Take 1 tablet (50 mg total) by mouth every 12 (twelve) hours. 60 tablet 6  . metoprolol tartrate (LOPRESSOR) 25 MG tablet Take 0.5 tablets (12.5 mg total) by mouth every 12 (twelve) hours. 30 tablet 6   No current facility-administered medications for this visit.    LABS/IMAGING: No results found for this or any previous visit (from the past 48 hour(s)). No results found.  WEIGHTS: Wt Readings from Last 3 Encounters:  07/29/15 239 lb (108.41 kg)  07/15/15 239 lb 1.6 oz (108.455 kg)  07/08/15 241 lb 4.8 oz (109.453 kg)    VITALS: BP 120/82 mmHg  Pulse 57  Ht 5\' 11"  (1.803 m)  Wt 239 lb (108.41 kg)  BMI 33.35 kg/m2  EXAM: Deferred  EKG: Sinus bradycardia 57   ASSESSMENT: 1. Paroxysmal atrial fibrillation - CHADSVASC score of 1 2. Fatigue, shortness of breath and chest pain 3. Hypertension 4. Dyslipidemia  PLAN: 1.   Troy Acosta is very symptomatic with his atrial fibrillation. Despite rate control on Cardizem, he is still very aware of his slower A. fib. Based on this, he is probably a better candidate for antiarrhythmic therapy. We discussed options and I would recommend a trial of flecainide 50 mg every 12 hours. With his recent negative exercise treadmill stress test, I'm more reassured that this would be a safe option for him. I am a little concerned about his bradycardia with the Cardizem and will plan to discontinue that in favor of a lower dose rate control medicine, namely metoprolol tartrate 12.5 twice a day. Plan to see him back in a month and we'll reassess his rhythm control. He is in sinus rhythm today. He should remain on Xarelto without interruption.  I  advised him to return to the office on Monday or Tuesday of next week to get an EKG and monitor his QT interval. He was also advised to avoid medications that prolong QTC and always contact me or our office if a provider wishes to prescribe new medications that could potentially interact with his flecainide.  Pixie Casino, MD, [facility] Attending Cardiologist [place] C Yuriy Cui 07/29/2015, 4:56 PM

## 2015-08-03 ENCOUNTER — Ambulatory Visit (INDEPENDENT_AMBULATORY_CARE_PROVIDER_SITE_OTHER): Payer: BLUE CROSS/BLUE SHIELD

## 2015-08-03 VITALS — BP 130/74 | HR 49 | Ht 71.0 in | Wt 236.4 lb

## 2015-08-03 DIAGNOSIS — I480 Paroxysmal atrial fibrillation: Secondary | ICD-10-CM

## 2015-08-03 NOTE — Patient Instructions (Signed)
Continue same medications   Keep appointment with Dr.Hilty 08/30/15 at 11:00 am.

## 2015-08-03 NOTE — Progress Notes (Signed)
1.) Reason for visit: EKG  2.) Name of MD requesting visit: Dr.Hilty  3.) H&P: AFib, 07/29/15 office visit with Dr.Hilty Diltiazem D/C.Flecainide 50 mg every 12 hrs and Metoprolol 12.5 mg every 12 hours started.  4.) ROS related to problem: No complaints.EKG today revealed sinus rhythm rate 49.  5.) Assessment and plan per MD: Continue same medications.Keep appointment with Dr.Hilty 08/30/15 at 11:00 am.

## 2015-08-05 ENCOUNTER — Ambulatory Visit: Payer: BLUE CROSS/BLUE SHIELD | Admitting: Internal Medicine

## 2015-08-30 ENCOUNTER — Ambulatory Visit (INDEPENDENT_AMBULATORY_CARE_PROVIDER_SITE_OTHER): Payer: BLUE CROSS/BLUE SHIELD | Admitting: Internal Medicine

## 2015-08-30 ENCOUNTER — Encounter: Payer: Self-pay | Admitting: Internal Medicine

## 2015-08-30 VITALS — BP 122/80 | HR 54 | Ht 71.0 in | Wt 245.0 lb

## 2015-08-30 DIAGNOSIS — I480 Paroxysmal atrial fibrillation: Secondary | ICD-10-CM | POA: Diagnosis not present

## 2015-08-30 DIAGNOSIS — E785 Hyperlipidemia, unspecified: Secondary | ICD-10-CM | POA: Diagnosis not present

## 2015-08-30 DIAGNOSIS — I10 Essential (primary) hypertension: Secondary | ICD-10-CM

## 2015-08-30 NOTE — Progress Notes (Signed)
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 They did perform an EKG which was normal. There was a suggestion to see a cardiologist. [name] has history of hypertension and dyslipidemia. There is a family history of heart failure in his mother. He's had no anginal symptoms or worsening shortness of breath or fatigue since this episode. I wonder if he possibly had an episode of atrial fibrillation. He denied any palpitations or fluttering but did feel like a pressure across his chest. He reports good sleep at night and says that his wife does not report he snores or stops breathing.  [name] returns today for follow-up. I recently placed him on a monitor and this did confirm my suspicion that he has proximal atrial fibrillation. In fact he had an episode at rest where his heart rate went up to almost 180. This eventually subsided without therapy and he did call EMS to capture the A. fib but he declined going to the hospital. He did call the office and we advised she start on anticoagulation. Today's back in sinus rhythm with a controlled rate.  I saw [name] back in the office today for follow-up. His last office visit I started him on diltiazem for rate control. He reports he's had 2 other episodes of A. fib both of which cause significant fatigue although his heart rate was not elevated above 100 he was very symptomatic. He said the next day he felt very tired and is generally very uncomfortable with his A. fib. Despite good rate control, it seems that he is in need of rhythm control. He has had some intermittent episodes of chest discomfort and we did do an exercise treadmill stress test a few months ago which  was negative for ischemia.  [name] returns today for follow-up. Overall he is very pleased without is doing on flecainide. He's only had one short episode of A. fib but he said it resolved rather quickly. He's been able to go hunting and do other activities and is been not bothered by palpitations. Blood pressure heart rate seem to be well-controlled. His EKG in the office today shows sinus bradycardia at 54 with a QTC of 383 ms.  PMHx:  Past Medical History  Diagnosis Date  . Diaphragmatic hernia without mention of obstruction or gangrene   . Unspecified hypothyroidism   . Malaise and fatigue   . Arteritis, unspecified ([place])   . Iron deficiency anemia, unspecified   . Obesity   . Hyperplasia of prostate   . Essential hypertension, benign   . Other and unspecified hyperlipidemia     No past surgical history on file.  FAMHx:  Family History  Problem Relation Age of Onset  . Heart failure Mother   . Hypertension Mother   . Diabetes Mother   . Lung cancer Father   . COPD Father   . Tuberous sclerosis Father   . Heart disease Sister   . Cancer Brother   . Hypertension Sister   . Thyroid disease Sister   . Crohn's disease Sister     SOCHx:   reports that he quit smoking about 22 years ago. His smoking use included Cigarettes. He has  never used smokeless tobacco. He reports that he does not drink alcohol or use illicit drugs.  ALLERGIES:  No Known Allergies  ROS: A comprehensive review of systems was negative.  HOME MEDS: Current Outpatient Prescriptions  Medication Sig Dispense Refill  . Cholecalciferol (VITAMIN D3) 1000 UNITS CAPS Take 1 capsule by mouth daily.     . flecainide (TAMBOCOR) 50 MG tablet Take 1 tablet (50 mg total) by mouth every 12 (twelve) hours. 60 tablet 6  . hydrochlorothiazide (HYDRODIURIL) 25 MG tablet TAKE ONE TABLET BY MOUTH ONE TIME DAILY 90 tablet 0  . iron polysaccharides (NIFEREX) 150 MG capsule Take 1 capsule (150 mg total) by mouth daily.  90 capsule 3  . levothyroxine (LEVOTHROID) 25 MCG tablet Take 0.5 pill (12.5 mcg) every AM with levothyroxine 150 mcg to total 162.69mcg q day. 30 tablet 1  . levothyroxine (SYNTHROID, LEVOTHROID) 150 MCG tablet Take 1 tablet (150 mcg total) by mouth daily before breakfast. 90 tablet 3  . metoprolol tartrate (LOPRESSOR) 25 MG tablet Take 0.5 tablets (12.5 mg total) by mouth every 12 (twelve) hours. 30 tablet 6  . olmesartan (BENICAR) 20 MG tablet Take 20 mg by mouth daily.    . Omega-3 Fatty Acids (FISH OIL) 1000 MG CAPS Take 1 capsule by mouth daily.      . rivaroxaban (XARELTO) 20 MG TABS tablet Take 1 tablet (20 mg total) by mouth daily with supper. 30 tablet 6  . rosuvastatin (CRESTOR) 10 MG tablet Take 1 tablet (10 mg total) by mouth daily. (Patient taking differently: Take 5 mg by mouth daily. ) 90 tablet 3   No current facility-administered medications for this visit.    LABS/IMAGING: No results found for this or any previous visit (from the past 48 hour(s)). No results found.  WEIGHTS: Wt Readings from Last 3 Encounters:  08/30/15 245 lb (111.131 kg)  08/03/15 236 lb 7 oz (107.247 kg)  07/29/15 239 lb (108.41 kg)    VITALS: BP 122/80 mmHg  Pulse 54  Ht 5\' 11"  (1.803 m)  Wt 245 lb (111.131 kg)  BMI 34.19 kg/m2  EXAM: Deferred  EKG: Sinus bradycardia 54, QTc 383 msec  ASSESSMENT: 1. Paroxysmal atrial fibrillation - CHADSVASC score of 1 2. Fatigue, shortness of breath and chest pain 3. Hypertension 4. Dyslipidemia  PLAN: 1.   Mr. Mcquarter seems to be doing well on flecainide. He reports a very short episode of palpitations but otherwise has been asymptomatic. He's very pleased with the medicine and will plan to continue it. He seems to be tolerating based on his EKG. I have of course cautioned him and his providers to watch for other medicines it may prolong the QT interval. We'll plan to see him back in 3 months if he is stable then most likely can see him either in 6-12  months after that.  Pixie Casino, MD, [facility] Attending Cardiologist [place] [facility] Amelianna Meller 08/30/2015, 1:13 PM

## 2015-08-30 NOTE — Patient Instructions (Signed)
Your physician recommends that you schedule a follow-up appointment in: 3 months with Dr. Hilty.  

## 2015-09-27 ENCOUNTER — Ambulatory Visit: Payer: BC Managed Care – PPO | Admitting: Family Medicine

## 2015-09-28 ENCOUNTER — Encounter: Payer: Self-pay | Admitting: Family Medicine

## 2015-09-28 ENCOUNTER — Telehealth: Payer: Self-pay | Admitting: Family Medicine

## 2015-09-28 NOTE — Telephone Encounter (Signed)
Please review

## 2015-10-05 ENCOUNTER — Ambulatory Visit (INDEPENDENT_AMBULATORY_CARE_PROVIDER_SITE_OTHER): Payer: BLUE CROSS/BLUE SHIELD | Admitting: Family Medicine

## 2015-10-05 ENCOUNTER — Encounter: Payer: Self-pay | Admitting: Family Medicine

## 2015-10-05 VITALS — BP 140/84 | HR 69 | Temp 97.1°F | Ht 71.0 in | Wt 242.0 lb

## 2015-10-05 DIAGNOSIS — E785 Hyperlipidemia, unspecified: Secondary | ICD-10-CM

## 2015-10-05 DIAGNOSIS — D509 Iron deficiency anemia, unspecified: Secondary | ICD-10-CM

## 2015-10-05 DIAGNOSIS — I10 Essential (primary) hypertension: Secondary | ICD-10-CM

## 2015-10-05 DIAGNOSIS — I480 Paroxysmal atrial fibrillation: Secondary | ICD-10-CM

## 2015-10-05 DIAGNOSIS — E034 Atrophy of thyroid (acquired): Secondary | ICD-10-CM

## 2015-10-05 DIAGNOSIS — E039 Hypothyroidism, unspecified: Secondary | ICD-10-CM

## 2015-10-05 DIAGNOSIS — Z0000 Encounter for general adult medical examination without abnormal findings: Secondary | ICD-10-CM | POA: Diagnosis not present

## 2015-10-05 MED ORDER — METOPROLOL TARTRATE 25 MG PO TABS: 12.5000 mg | ORAL_TABLET | Freq: Two times a day (BID) | ORAL | Status: DC

## 2015-10-05 MED ORDER — HYDROCHLOROTHIAZIDE 25 MG PO TABS: 25.0000 mg | ORAL_TABLET | Freq: Every day | ORAL | Status: DC

## 2015-10-05 MED ORDER — ROSUVASTATIN CALCIUM 5 MG PO TABS: 5.0000 mg | ORAL_TABLET | Freq: Every day | ORAL | Status: DC

## 2015-10-05 MED ORDER — RIVAROXABAN 20 MG PO TABS: 20.0000 mg | ORAL_TABLET | Freq: Every day | ORAL | Status: DC

## 2015-10-05 MED ORDER — FLECAINIDE ACETATE 50 MG PO TABS: 50.0000 mg | ORAL_TABLET | Freq: Two times a day (BID) | ORAL | Status: DC

## 2015-10-05 MED ORDER — LEVOTHYROXINE SODIUM 25 MCG PO TABS: ORAL_TABLET | ORAL | Status: DC

## 2015-10-05 MED ORDER — POLYSACCHARIDE IRON COMPLEX 150 MG PO CAPS: 150.0000 mg | ORAL_CAPSULE | Freq: Every day | ORAL | Status: DC

## 2015-10-05 MED ORDER — LEVOTHYROXINE SODIUM 150 MCG PO TABS: 150.0000 ug | ORAL_TABLET | Freq: Every day | ORAL | Status: DC

## 2015-10-05 MED ORDER — OLMESARTAN MEDOXOMIL 40 MG PO TABS: 40.0000 mg | ORAL_TABLET | Freq: Every day | ORAL | Status: DC

## 2015-10-05 NOTE — Progress Notes (Signed)
Subjective:    Patient ID: Troy Acosta, male    DOB: [birth year], 64 y.o.   MRN: [id number]  HPI Patient is here today for annual wellness exam and follow up of chronic medical problems which includes hypertension, a fib, and hypothyroid. He is taking medications regularly. Since the patient's last visit he has been followed by the cardiologist for his paroxysmal atrial fibrillation that was determined as a result of him having lightheaded episodes and syncopal episodes. He brings his blood pressures in for review today and these will be scanned into the record and the majority of these are good and within the normal range. He also brings in some outside lab work for review. On a traditional lipid panel his LDL cholesterol was good at 66. His free T4 was within normal limits. His blood sugar was good at 80 and his creatinine and electrolytes were all within normal limits. All liver function tests were good and his white blood cell count was good at 10.5 with a hemoglobin that was very good at 15.3. The platelet count was adequate. Most importantly his TSH was normal. His vitamin D level was 35. The patient denies chest pain shortness of breath trouble swallowing and heartburn indigestion and nausea and vomiting diarrhea or blood in the stool. He does have a lot of stiffness and pain in his knees and is actually better when he is walking and when he is sitting still. He has not had any further episodes of passing out or weakness since he was started on flecainide. He has a follow-up appointment scheduled with the cardiologist in a couple months. He will get his PSA done at work and this will be forwarded to [place].      Patient Active Problem List   Diagnosis Date Noted  . PAF (paroxysmal atrial fibrillation) ([place]) 07/15/2015  . Heart palpitations 07/11/2015  . Pain in the chest 02/18/2015  . Dyspnea 02/18/2015  . Anemia, iron deficiency 09/10/2013  . Diaphragmatic hernia without mention of obstruction  or gangrene   . Hypothyroidism   . Malaise and fatigue   . Arteritis, unspecified ([place])   . Iron deficiency anemia, unspecified   . Obesity   . BPH (benign prostatic hyperplasia)   . Essential hypertension, benign   . Hyperlipidemia    Outpatient Encounter Prescriptions as of 10/05/2015  Medication Sig  . Cholecalciferol (VITAMIN D3) 1000 UNITS CAPS Take 1 capsule by mouth daily.   . flecainide (TAMBOCOR) 50 MG tablet Take 1 tablet (50 mg total) by mouth every 12 (twelve) hours.  . hydrochlorothiazide (HYDRODIURIL) 25 MG tablet TAKE ONE TABLET BY MOUTH ONE TIME DAILY  . iron polysaccharides (NIFEREX) 150 MG capsule Take 1 capsule (150 mg total) by mouth daily.  Marland Kitchen levothyroxine (LEVOTHROID) 25 MCG tablet Take 0.5 pill (12.5 mcg) every AM with levothyroxine 150 mcg to total 162.29mcg q day.  . levothyroxine (SYNTHROID, LEVOTHROID) 150 MCG tablet Take 1 tablet (150 mcg total) by mouth daily before breakfast.  . metoprolol tartrate (LOPRESSOR) 25 MG tablet Take 0.5 tablets (12.5 mg total) by mouth every 12 (twelve) hours.  Marland Kitchen olmesartan (BENICAR) 40 MG tablet Take 40 mg by mouth daily.  . Omega-3 Fatty Acids (FISH OIL) 1000 MG CAPS Take 1 capsule by mouth daily.    . rivaroxaban (XARELTO) 20 MG TABS tablet Take 1 tablet (20 mg total) by mouth daily with supper.  . rosuvastatin (CRESTOR) 5 MG tablet Take 5 mg by mouth daily.  . [DISCONTINUED] olmesartan (BENICAR)  20 MG tablet Take 20 mg by mouth daily.  . [DISCONTINUED] rosuvastatin (CRESTOR) 10 MG tablet Take 1 tablet (10 mg total) by mouth daily. (Patient taking differently: Take 5 mg by mouth daily. )   No facility-administered encounter medications on file as of 10/05/2015.      Review of Systems  Constitutional: Negative.   HENT: Negative.   Eyes: Negative.   Respiratory: Negative.   Cardiovascular: Negative.   Gastrointestinal: Negative.   Endocrine: Negative.   Genitourinary: Negative.   Musculoskeletal: Positive for  arthralgias (bilateral knee pain - R>L).  Skin: Negative.   Allergic/Immunologic: Negative.   Neurological: Negative.   Hematological: Negative.   Psychiatric/Behavioral: Positive for confusion.       Objective:   Physical Exam  Constitutional: He is oriented to person, place, and time. He appears well-developed and well-nourished. No distress.  HENT:  Head: Normocephalic and atraumatic.  Right Ear: External ear normal.  Left Ear: External ear normal.  Nose: Nose normal.  Mouth/Throat: Oropharynx is clear and moist. No oropharyngeal exudate.  Eyes: Conjunctivae and EOM are normal. Pupils are equal, round, and reactive to light. Right eye exhibits no discharge. Left eye exhibits no discharge. No scleral icterus.  Neck: Normal range of motion. Neck supple. No thyromegaly present.  Cardiovascular: Normal rate, regular rhythm, normal heart sounds and intact distal pulses.   No murmur heard. The rhythm is regular at 60/m  Pulmonary/Chest: Effort normal and breath sounds normal. No respiratory distress. He has no wheezes. He has no rales. He exhibits no tenderness.  There is no axillary adenopathy Lungs are clear anteriorly and posteriorly  Abdominal: Soft. Bowel sounds are normal. He exhibits no mass. There is no tenderness. There is no rebound and no guarding.  The abdomen was soft without masses tenderness or organ enlargement and there is no inguinal adenopathy  Genitourinary: Rectum normal and penis normal.  The prostate was enlarged but soft and smooth and the rectum was clear of masses. There are no inguinal hernias and the testicles were normal bilaterally.  Musculoskeletal: Normal range of motion. He exhibits no edema or tenderness.  The patient had fairly good movement of both knees today without pain.  Lymphadenopathy:    He has no cervical adenopathy.  Neurological: He is alert and oriented to person, place, and time. He has normal reflexes. No cranial nerve deficit.  Skin:  Skin is warm and dry. No rash noted.  Psychiatric: He has a normal mood and affect. His behavior is normal. Judgment and thought content normal.  Nursing note and vitals reviewed.  BP 140/84 mmHg  Pulse 69  Temp(Src) 97.1 F (36.2 C) (Oral)  Ht 5\' 11"  (1.803 m)  Wt 242 lb (109.77 kg)  BMI 33.77 kg/m2 Repeat blood pressure was about the same as initial blood pressure coming in the office.       Assessment & Plan:  1. Annual physical exam -Checked at work for status of shingles shot and pneumonia shots and let us know that information  2. Hypothyroidism, unspecified hypothyroidism type -Recent lab work from work indicates the TSH was good and he should continue with current treatment  3. Essential hypertension, benign -Home blood pressures in outside blood pressures have been good and he should continue with current treatment  4. Anemia, iron deficiency -The hemoglobin most recently from the outside blood work was in the 15 range and this concerns me and we will repeat a CBC and a couple months and if the hemoglobin continues  to rise we may need to reduce the intake of iron  5. PAF (paroxysmal atrial fibrillation) ([place]) -Follow-up with cardiology as planned - rivaroxaban (XARELTO) 20 MG TABS tablet; Take 1 tablet (20 mg total) by mouth daily with supper.  Dispense: 90 tablet; Refill: 3  6. Hypothyroidism due to acquired atrophy of thyroid -Continue current treatment  7. Hyperlipidemia -Cholesterol numbers were good and he should continue with current treatment  Meds ordered this encounter  Medications  . DISCONTD: olmesartan (BENICAR) 40 MG tablet    Sig: Take 40 mg by mouth daily.  Marland Kitchen DISCONTD: rosuvastatin (CRESTOR) 5 MG tablet    Sig: Take 5 mg by mouth daily.  . rivaroxaban (XARELTO) 20 MG TABS tablet    Sig: Take 1 tablet (20 mg total) by mouth daily with supper.    Dispense:  90 tablet    Refill:  3  . flecainide (TAMBOCOR) 50 MG tablet    Sig: Take 1 tablet (50  mg total) by mouth every 12 (twelve) hours.    Dispense:  180 tablet    Refill:  3  . hydrochlorothiazide (HYDRODIURIL) 25 MG tablet    Sig: Take 1 tablet (25 mg total) by mouth daily.    Dispense:  90 tablet    Refill:  3  . iron polysaccharides (NIFEREX) 150 MG capsule    Sig: Take 1 capsule (150 mg total) by mouth daily.    Dispense:  90 capsule    Refill:  3  . levothyroxine (LEVOTHROID) 25 MCG tablet    Sig: Take 0.5 pill (12.5 mcg) every AM with levothyroxine 150 mcg to total 162.70mcg q day.    Dispense:  45 tablet    Refill:  6  . levothyroxine (SYNTHROID, LEVOTHROID) 150 MCG tablet    Sig: Take 1 tablet (150 mcg total) by mouth daily before breakfast.    Dispense:  90 tablet    Refill:  3  . metoprolol tartrate (LOPRESSOR) 25 MG tablet    Sig: Take 0.5 tablets (12.5 mg total) by mouth every 12 (twelve) hours.    Dispense:  90 tablet    Refill:  3  . olmesartan (BENICAR) 40 MG tablet    Sig: Take 1 tablet (40 mg total) by mouth daily.    Dispense:  90 tablet    Refill:  3  . rosuvastatin (CRESTOR) 5 MG tablet    Sig: Take 1 tablet (5 mg total) by mouth daily.    Dispense:  90 tablet    Refill:  3   Patient Instructions  Continue current medications. Continue good therapeutic lifestyle changes which include good diet and exercise. Fall precautions discussed with patient. If an FOBT was given today- please return it to our front desk. If you are over 31 years old - you may need Prevnar 89 or the adult Pneumonia vaccine.  **Flu shots are available--- please call and schedule a FLU-CLINIC appointment**  After your visit with [place] today you will receive a survey in the mail or online from [employer] regarding your care with [place]. Please take a moment to fill this out. Your feedback is very important to [place] as you can help [place] better understand your patient needs as well as improve your experience and satisfaction. WE CARE ABOUT YOU!!!   Please check with your insurance  regarding the Prevnar vaccine and wear the you've had a Pneumovax in the past Also make sure that you have had a shingles shot Please get a repeat  CBC and a couple of months at work and make sure that we get a copy of this report Continue to follow-up with the cardiologist Be sure and get a PSA at work and make sure that we get a copy of this Be sure and get cardiac approval prior to your knee surgery this summer   Arrie Senate MD

## 2015-10-05 NOTE — Patient Instructions (Addendum)
Continue current medications. Continue good therapeutic lifestyle changes which include good diet and exercise. Fall precautions discussed with patient. If an FOBT was given today- please return it to our front desk. If you are over 62 years old - you may need Prevnar 18 or the adult Pneumonia vaccine.  **Flu shots are available--- please call and schedule a FLU-CLINIC appointment**  After your visit with [place] today you will receive a survey in the mail or online from [employer] regarding your care with [place]. Please take a moment to fill this out. Your feedback is very important to [place] as you can help [place] better understand your patient needs as well as improve your experience and satisfaction. WE CARE ABOUT YOU!!!   Please check with your insurance regarding the Prevnar vaccine and wear the you've had a Pneumovax in the past Also make sure that you have had a shingles shot Please get a repeat CBC and a couple of months at work and make sure that we get a copy of this report Continue to follow-up with the cardiologist Be sure and get a PSA at work and make sure that we get a copy of this Be sure and get cardiac approval prior to your knee surgery this summer

## 2015-10-13 ENCOUNTER — Encounter: Payer: Self-pay | Admitting: Family Medicine

## 2015-10-14 ENCOUNTER — Other Ambulatory Visit: Payer: Self-pay | Admitting: Family Medicine

## 2015-10-26 ENCOUNTER — Ambulatory Visit (HOSPITAL_COMMUNITY)
Admission: RE | Admit: 2015-10-26 | Discharge: 2015-10-26 | Disposition: A | Discharge status: Home / Self Care | Payer: BLUE CROSS/BLUE SHIELD | Source: Ambulatory Visit | Attending: Nurse Practitioner | Admitting: Nurse Practitioner

## 2015-10-26 ENCOUNTER — Telehealth: Payer: Self-pay | Admitting: Internal Medicine

## 2015-10-26 VITALS — BP 102/80 | HR 87 | Ht 71.0 in | Wt 242.4 lb

## 2015-10-26 DIAGNOSIS — I480 Paroxysmal atrial fibrillation: Secondary | ICD-10-CM | POA: Diagnosis not present

## 2015-10-26 MED ORDER — FLECAINIDE ACETATE 50 MG PO TABS: 75.0000 mg | ORAL_TABLET | Freq: Two times a day (BID) | ORAL | Status: DC

## 2015-10-26 NOTE — Telephone Encounter (Signed)
Patient has a 3pm appointment today [facility].  Patient at this time remains in Afib in the 90's No symptoms beyond a feeling a indigestion he typically feels with the onset of a fib.  Given patient address, contact number, address and parking garage code [id number] Patient agreeable to plan

## 2015-10-26 NOTE — Telephone Encounter (Signed)
Thanks - Dr H 

## 2015-10-26 NOTE — Telephone Encounter (Signed)
Pt's wife is calling in stating that the pt has had two episodes of Afib in the last couple of days. Pt is concerned and would like to speak with a nurse about this    On Friday lasting 4 hours. This morning woke up with it at 0300 could tell his heart rate was up 124  BP 145/90 HR right now (11am) is in the 90's Can feel it, like he is still in Atrial fib Last episode 3 months ago Last Thursday he forgot to take one dose but was able to get the dose in No chest pain, shortness of breath..but gets the feeling of indigestion with afib  Will route to Dr. Debara Pickett and discuss with DOD

## 2015-10-26 NOTE — Telephone Encounter (Signed)
Pt's wife is calling in stating that the pt has had two episodes of Afib in the last couple of days. Pt is concerned and would like to speak with a nurse about this .   Thanks

## 2015-10-26 NOTE — Patient Instructions (Signed)
Your physician has recommended you make the following change in your medication:  1)Increase flecainide to 75mg  twice a day

## 2015-10-27 ENCOUNTER — Encounter (HOSPITAL_COMMUNITY): Payer: Self-pay | Admitting: Nurse Practitioner

## 2015-10-27 NOTE — Progress Notes (Signed)
Patient ID: Troy Acosta, male   DOB: 28-Jan-1951, 65 y.o.   MRN: [id number]     Primary Care Physician: Troy Gainer, MD Referring Physician: Dr. Excell Seltzer Acosta is a 65 y.o. male with h/o PAF that pt first started noticing irregular hear tbeat  around 9 months ago but was diagnosed after wearing a holter monitor in September. He was started on flecainide 50 mg bid and has not had any further issues with afib until Friday pm which lasted several hours and self converted. He then woke up this am with afib and asked to be seen and is referred to the afib clinic by Troy Acosta triage/Dr. Debara Acosta. He is still in afib but is rate controlled and was surprised to find out he is still in it. His heart rate was around 120 bpm this am, at which time he was more symptomatic, but thinks he feels better because he is now rate controlled. He is not drinking any alcohol, minimal caffeine, wife denies snoring.He cannot identify any triggers. He is compliant with xarelto.  Today, he denies symptoms of palpitations, chest pain, shortness of breath, orthopnea, PND, lower extremity edema, dizziness, presyncope, syncope, or neurologic sequela. The patient is tolerating medications without difficulties and is otherwise without complaint today.   Past Medical History  Diagnosis Date  . Diaphragmatic hernia without mention of obstruction or gangrene   . Unspecified hypothyroidism   . Malaise and fatigue   . Arteritis, unspecified ([place])   . Iron deficiency anemia, unspecified   . Obesity   . Hyperplasia of prostate   . Essential hypertension, benign   . Other and unspecified hyperlipidemia    No past surgical history on file.  Current Outpatient Prescriptions  Medication Sig Dispense Refill  . Cholecalciferol (VITAMIN D3) 1000 UNITS CAPS Take 1 capsule by mouth daily.     Marland Kitchen FERREX 150 150 MG capsule TAKE ONE CAPSULE BY MOUTH ONE TIME DAILY 90 capsule 1  . flecainide (TAMBOCOR) 50 MG tablet Take 1.5 tablets (75 mg  total) by mouth every 12 (twelve) hours. 180 tablet 3  . hydrochlorothiazide (HYDRODIURIL) 25 MG tablet TAKE ONE TABLET BY MOUTH ONE  TIME DAILY 90 tablet 1  . levothyroxine (LEVOTHROID) 25 MCG tablet Take 0.5 pill (12.5 mcg) every AM with levothyroxine 150 mcg to total 162.17mcg q day. 45 tablet 6  . levothyroxine (SYNTHROID, LEVOTHROID) 150 MCG tablet Take 1 tablet (150 mcg total) by mouth daily before breakfast. 90 tablet 3  . metoprolol tartrate (LOPRESSOR) 25 MG tablet Take 0.5 tablets (12.5 mg total) by mouth every 12 (twelve) hours. 90 tablet 3  . olmesartan (BENICAR) 40 MG tablet Take 1 tablet (40 mg total) by mouth daily. 90 tablet 3  . Omega-3 Fatty Acids (FISH OIL) 1000 MG CAPS Take 1 capsule by mouth daily.      . rivaroxaban (XARELTO) 20 MG TABS tablet Take 1 tablet (20 mg total) by mouth daily with supper. 90 tablet 3  . rosuvastatin (CRESTOR) 5 MG tablet Take 1 tablet (5 mg total) by mouth daily. 90 tablet 3   No current facility-administered medications for this encounter.    No Known Allergies  Social History   Social History  . Marital Status: Married    Spouse Name: N/A  . Number of Children: N/A  . Years of Education: 12   Occupational History  . electrician    Social History Main Topics  . Smoking status: Former Smoker    Types: Cigarettes  Quit date: 12/06/1992  . Smokeless tobacco: Never Used  . Alcohol Use: No  . Drug Use: No  . Sexual Activity:    Partners: Female   Other Topics Concern  . Not on file   Social History Narrative    Family History  Problem Relation Age of Onset  . Heart failure Mother   . Hypertension Mother   . Diabetes Mother   . Lung cancer Father   . COPD Father   . Tuberous sclerosis Father   . Heart disease Sister   . Cancer Brother   . Hypertension Sister   . Thyroid disease Sister   . Crohn's disease Sister     ROS- All systems are reviewed and negative except as per the HPI above  Physical Exam: Filed  Vitals:   10/26/15 1456  BP: 102/80  Pulse: 87  Height: 5\' 11"  (1.803 m)  Weight: 242 lb 6.4 oz (109.952 kg)    GEN- The patient is well appearing, alert and oriented x 3 today.   Head- normocephalic, atraumatic Eyes-  Sclera clear, conjunctiva pink Ears- hearing intact Oropharynx- clear Neck- supple, no JVP Lymph- no cervical lymphadenopathy Lungs- Clear to ausculation bilaterally, normal work of breathing Heart- Regular rate and rhythm, no murmurs, rubs or gallops, PMI not laterally displaced GI- soft, NT, ND, + BS Extremities- no clubbing, cyanosis, or edema MS- no significant deformity or atrophy Skin- no rash or lesion Psych- euthymic mood, full affect Neuro- strength and sensation are intact  EKG-Atrial flutter with v rate of 87 bpm, qrs int 106 ms, qtc 418 ms.  Assessment and Plan: 1. PAF Two recent breakthrough episodes Will increase flecainide to 75 mg bid Continue metoprolol 12.5 mg bid Will obtain echo for left atrial size, lv function that may have to be considered if pt is referred for ablation at some point in the future Continue xarelto  F/u in one week, sooner if he does not convert with  increase in flecainide.  Troy Acosta, [facility] [address] [place], [place] [address] [phone]

## 2015-11-01 ENCOUNTER — Observation Stay (HOSPITAL_COMMUNITY)
Admission: EM | Admit: 2015-11-01 | Discharge: 2015-11-02 | Disposition: A | Discharge status: Home / Self Care | Payer: BLUE CROSS/BLUE SHIELD | Attending: Internal Medicine | Admitting: Internal Medicine

## 2015-11-01 ENCOUNTER — Ambulatory Visit (HOSPITAL_COMMUNITY): Admission: RE | Admit: 2015-11-01 | Payer: BLUE CROSS/BLUE SHIELD | Source: Ambulatory Visit

## 2015-11-01 ENCOUNTER — Encounter (HOSPITAL_COMMUNITY): Payer: Self-pay | Admitting: Emergency Medicine

## 2015-11-01 ENCOUNTER — Emergency Department (HOSPITAL_COMMUNITY): Payer: BLUE CROSS/BLUE SHIELD

## 2015-11-01 DIAGNOSIS — E669 Obesity, unspecified: Secondary | ICD-10-CM | POA: Insufficient documentation

## 2015-11-01 DIAGNOSIS — K449 Diaphragmatic hernia without obstruction or gangrene: Secondary | ICD-10-CM | POA: Diagnosis not present

## 2015-11-01 DIAGNOSIS — I776 Arteritis, unspecified: Secondary | ICD-10-CM | POA: Insufficient documentation

## 2015-11-01 DIAGNOSIS — R072 Precordial pain: Secondary | ICD-10-CM | POA: Diagnosis not present

## 2015-11-01 DIAGNOSIS — E785 Hyperlipidemia, unspecified: Secondary | ICD-10-CM | POA: Insufficient documentation

## 2015-11-01 DIAGNOSIS — Z87891 Personal history of nicotine dependence: Secondary | ICD-10-CM | POA: Insufficient documentation

## 2015-11-01 DIAGNOSIS — E039 Hypothyroidism, unspecified: Secondary | ICD-10-CM | POA: Insufficient documentation

## 2015-11-01 DIAGNOSIS — I10 Essential (primary) hypertension: Secondary | ICD-10-CM | POA: Diagnosis not present

## 2015-11-01 DIAGNOSIS — R61 Generalized hyperhidrosis: Secondary | ICD-10-CM | POA: Diagnosis not present

## 2015-11-01 DIAGNOSIS — D509 Iron deficiency anemia, unspecified: Secondary | ICD-10-CM | POA: Insufficient documentation

## 2015-11-01 DIAGNOSIS — Z7901 Long term (current) use of anticoagulants: Secondary | ICD-10-CM | POA: Diagnosis not present

## 2015-11-01 DIAGNOSIS — Z79899 Other long term (current) drug therapy: Secondary | ICD-10-CM | POA: Insufficient documentation

## 2015-11-01 DIAGNOSIS — I480 Paroxysmal atrial fibrillation: Secondary | ICD-10-CM | POA: Diagnosis not present

## 2015-11-01 DIAGNOSIS — R079 Chest pain, unspecified: Principal | ICD-10-CM | POA: Insufficient documentation

## 2015-11-01 DIAGNOSIS — Z5181 Encounter for therapeutic drug level monitoring: Secondary | ICD-10-CM

## 2015-11-01 LAB — CBC
HEMATOCRIT: 46.4 % (ref 39.0–52.0)
HEMOGLOBIN: 16.2 g/dL (ref 13.0–17.0)
MCH: 30.3 pg (ref 26.0–34.0)
MCHC: 34.9 g/dL (ref 30.0–36.0)
MCV: 86.7 fL (ref 78.0–100.0)
Platelets: 173 10*3/uL (ref 150–400)
RBC: 5.35 MIL/uL (ref 4.22–5.81)
RDW: 13.2 % (ref 11.5–15.5)
WBC: 10.1 10*3/uL (ref 4.0–10.5)

## 2015-11-01 LAB — BASIC METABOLIC PANEL
ANION GAP: 10 (ref 5–15)
BUN: 18 mg/dL (ref 6–20)
CHLORIDE: 102 mmol/L (ref 101–111)
CO2: 27 mmol/L (ref 22–32)
Calcium: 9.6 mg/dL (ref 8.9–10.3)
Creatinine, Ser: 1.16 mg/dL (ref 0.61–1.24)
Glucose, Bld: 102 mg/dL — ABNORMAL HIGH (ref 65–99)
POTASSIUM: 4.5 mmol/L (ref 3.5–5.1)
SODIUM: 139 mmol/L (ref 135–145)

## 2015-11-01 LAB — TROPONIN I
Troponin I: <0.03 ng/mL (ref <0.031)
Troponin I: <0.03 ng/mL (ref <0.031)
Troponin I: <0.03 ng/mL (ref <0.031)

## 2015-11-01 LAB — TSH: TSH: 0.909 u[IU]/mL (ref 0.350–4.500)

## 2015-11-01 LAB — T4, FREE: Free T4: 1.17 ng/dL — ABNORMAL HIGH (ref 0.61–1.12)

## 2015-11-01 MED ORDER — LEVOTHYROXINE SODIUM 150 MCG PO TABS: 150.0000 ug | ORAL_TABLET | Freq: Every day | ORAL | Status: DC

## 2015-11-01 MED ORDER — ROSUVASTATIN CALCIUM 5 MG PO TABS
5.0000 mg | ORAL_TABLET | Freq: Every day | ORAL | Status: DC
  Administered 2015-11-01: 5 mg via ORAL
  Filled 2015-11-01 (×2): qty 1

## 2015-11-01 MED ORDER — ASPIRIN EC 81 MG PO TBEC
81.0000 mg | DELAYED_RELEASE_TABLET | Freq: Every day | ORAL | Status: DC
  Filled 2015-11-01: qty 1

## 2015-11-01 MED ORDER — LEVOTHYROXINE SODIUM 75 MCG PO TABS
162.5000 ug | ORAL_TABLET | Freq: Every day | ORAL | Status: DC
  Administered 2015-11-02: 162.5 ug via ORAL
  Filled 2015-11-01: qty 2

## 2015-11-01 MED ORDER — LEVOTHYROXINE SODIUM 25 MCG PO TABS: 12.5000 ug | ORAL_TABLET | Freq: Every day | ORAL | Status: DC

## 2015-11-01 MED ORDER — RIVAROXABAN 20 MG PO TABS
20.0000 mg | ORAL_TABLET | Freq: Every day | ORAL | Status: DC
  Administered 2015-11-01: 20 mg via ORAL
  Filled 2015-11-01: qty 1

## 2015-11-01 MED ORDER — ONDANSETRON HCL 4 MG/2ML IJ SOLN: 4.0000 mg | Freq: Four times a day (QID) | INTRAMUSCULAR | Status: DC | PRN

## 2015-11-01 MED ORDER — POLYSACCHARIDE IRON COMPLEX 150 MG PO CAPS
150.0000 mg | ORAL_CAPSULE | Freq: Every evening | ORAL | Status: DC
  Administered 2015-11-01: 150 mg via ORAL
  Filled 2015-11-01: qty 1

## 2015-11-01 MED ORDER — NITROGLYCERIN 0.4 MG SL SUBL: 0.4000 mg | SUBLINGUAL_TABLET | SUBLINGUAL | Status: DC | PRN

## 2015-11-01 MED ORDER — FLECAINIDE ACETATE 50 MG PO TABS
75.0000 mg | ORAL_TABLET | Freq: Two times a day (BID) | ORAL | Status: DC
  Administered 2015-11-01 – 2015-11-02 (×2): 75 mg via ORAL
  Filled 2015-11-01 (×3): qty 2

## 2015-11-01 MED ORDER — METOPROLOL TARTRATE 12.5 MG HALF TABLET
12.5000 mg | ORAL_TABLET | Freq: Two times a day (BID) | ORAL | Status: DC
  Filled 2015-11-01: qty 1

## 2015-11-01 MED ORDER — ACETAMINOPHEN 325 MG PO TABS: 650.0000 mg | ORAL_TABLET | ORAL | Status: DC | PRN

## 2015-11-01 MED ORDER — IRBESARTAN 300 MG PO TABS
300.0000 mg | ORAL_TABLET | Freq: Every day | ORAL | Status: DC
  Filled 2015-11-01: qty 2
  Filled 2015-11-01: qty 1

## 2015-11-01 MED ORDER — HYDROCHLOROTHIAZIDE 25 MG PO TABS
25.0000 mg | ORAL_TABLET | Freq: Every day | ORAL | Status: DC
  Administered 2015-11-02: 25 mg via ORAL
  Filled 2015-11-01: qty 1

## 2015-11-01 NOTE — ED Notes (Signed)
MD at bedside. 

## 2015-11-01 NOTE — ED Notes (Signed)
Pt reports that he has a hx of a fib, provider changed his medication last Tuesday, since then he has experienced periods of diaphoresis and chest "discomfort".  Pt reports that this am he experienced it again this morning and came to the ED.  He does have an EEG scheduled for 2pm today here at [facility].

## 2015-11-01 NOTE — ED Provider Notes (Signed)
CSN: [id number]     Arrival date & time 11/01/15  [id number] History   First MD Initiated Contact with Patient 11/01/15 [phone]     Chief Complaint  Patient presents with  . Chest Pain     (Consider location/radiation/quality/duration/timing/severity/associated sxs/prior Treatment) HPI Comments: 65 year old male with history of hypothyroidism, high blood pressure, anemia, heart palpitations, paroxysmal atrial fibrillation flecainide and metoprolol presents with intermittent chest discomfort and diaphoresis. Patient's had milder symptoms in the past however the past week he has had more frequent episodes of diaphoresis. Not specifically exertional related. Patient did have flecainide increased to 75 mg last Tuesday. No other new medications. Currently no chest pain or shortness of breath. No heart attack or blood clot history.  Patient is a 65 y.o. male presenting with chest pain. The history is provided by the patient.  Chest Pain Associated symptoms: diaphoresis   Associated symptoms: no abdominal pain, no back pain, no fever, no headache, no shortness of breath and not vomiting     Past Medical History  Diagnosis Date  . Diaphragmatic hernia without mention of obstruction or gangrene   . Unspecified hypothyroidism   . Malaise and fatigue   . Arteritis, unspecified ([place])   . Iron deficiency anemia, unspecified   . Obesity   . Hyperplasia of prostate   . Essential hypertension, benign   . Other and unspecified hyperlipidemia    History reviewed. No pertinent past surgical history. Family History  Problem Relation Age of Onset  . Heart failure Mother   . Hypertension Mother   . Diabetes Mother   . Lung cancer Father   . COPD Father   . Tuberous sclerosis Father   . Heart disease Sister   . Cancer Brother   . Hypertension Sister   . Thyroid disease Sister   . Crohn's disease Sister    Social History  Substance Use Topics  . Smoking status: Former Smoker    Types: Cigarettes   Quit date: 12/06/1992  . Smokeless tobacco: Never Used  . Alcohol Use: No    Review of Systems  Constitutional: Positive for diaphoresis. Negative for fever and chills.  HENT: Negative for congestion.   Eyes: Negative for visual disturbance.  Respiratory: Negative for shortness of breath.   Cardiovascular: Positive for chest pain.  Gastrointestinal: Negative for vomiting and abdominal pain.  Genitourinary: Negative for dysuria and flank pain.  Musculoskeletal: Negative for back pain, neck pain and neck stiffness.  Skin: Negative for rash.  Neurological: Negative for light-headedness and headaches.      Allergies  Review of patient's allergies indicates no known allergies.  Home Medications   Prior to Admission medications   Medication Sig Start Date End Date Taking? Authorizing Provider  Cholecalciferol (VITAMIN D3) 1000 UNITS CAPS Take 1 capsule by mouth at bedtime.    Yes Historical Provider, MD  FERREX 150 150 MG capsule TAKE ONE CAPSULE BY MOUTH ONE TIME DAILY Patient taking differently: TAKE ONE CAPSULE BY MOUTH every evening 10/14/15  Yes Chipper Herb, MD  hydrochlorothiazide (HYDRODIURIL) 25 MG tablet TAKE ONE TABLET BY MOUTH ONE  TIME DAILY 10/14/15  Yes Chipper Herb, MD  levothyroxine (LEVOTHROID) 25 MCG tablet Take 0.5 pill (12.5 mcg) every AM with levothyroxine 150 mcg to total 162.73mcg q day. 10/05/15  Yes Chipper Herb, MD  levothyroxine (SYNTHROID, LEVOTHROID) 150 MCG tablet Take 1 tablet (150 mcg total) by mouth daily before breakfast. 10/05/15  Yes Chipper Herb, MD  metoprolol tartrate (LOPRESSOR) 25  MG tablet Take 0.5 tablets (12.5 mg total) by mouth every 12 (twelve) hours. 10/05/15  Yes Chipper Herb, MD  rivaroxaban (XARELTO) 20 MG TABS tablet Take 1 tablet (20 mg total) by mouth daily with supper. 10/05/15  Yes Chipper Herb, MD  rosuvastatin (CRESTOR) 5 MG tablet Take 1 tablet (5 mg total) by mouth daily. Patient taking differently: Take 5 mg by mouth at  bedtime.  10/05/15  Yes Chipper Herb, MD  amLODipine (NORVASC) 2.5 MG tablet Take 1 tablet (2.5 mg total) by mouth daily. 11/02/15   Brett Canales, PA-C  olmesartan (BENICAR) 40 MG tablet Take 1 tablet (40 mg total) by mouth at bedtime. 11/02/15   Brett Canales, PA-C   BP 121/76 mmHg  Pulse 65  Temp(Src) 98.6 F (37 C) (Oral)  Resp 18  Ht 5\' 11"  (1.803 m)  Wt 236 lb 1.6 oz (107.094 kg)  BMI 32.94 kg/m2  SpO2 97% Physical Exam  Constitutional: He is oriented to person, place, and time. He appears well-developed and well-nourished.  HENT:  Head: Normocephalic and atraumatic.  Eyes: Conjunctivae are normal. Right eye exhibits no discharge. Left eye exhibits no discharge.  Neck: Normal range of motion. Neck supple. No tracheal deviation present.  Cardiovascular: Regular rhythm.   Pulmonary/Chest: Effort normal and breath sounds normal.  Abdominal: Soft. He exhibits no distension. There is no tenderness. There is no guarding.  Musculoskeletal: He exhibits no edema.  Neurological: He is alert and oriented to person, place, and time.  Skin: Skin is warm. No rash noted.  Psychiatric: He has a normal mood and affect.  Nursing note and vitals reviewed.   ED Course  Procedures (including critical care time) Labs Review Labs Reviewed  BASIC METABOLIC PANEL - Abnormal; Notable for the following:    Glucose, Bld 102 (*)    All other components within normal limits  T4, FREE - Abnormal; Notable for the following:    Free T4 1.17 (*)    All other components within normal limits  CBC  TROPONIN I  TROPONIN I  TROPONIN I  TROPONIN I  TSH  I-STAT TROPOININ, ED    Imaging Review No results found. I have personally reviewed and evaluated these images and lab results as part of my medical decision-making.   EKG Interpretation   Date/Time:  Monday November 01 2015 06:46:05 EST Ventricular Rate:  55 PR Interval:  164 QRS Duration: 115 QT Interval:  410 QTC Calculation: 392 R Axis:    13 Text Interpretation:  Sinus bradycardia Nonspecific intraventricular  conduction delay Confirmed by DELO  MD, DOUGLAS ([id number]) on 11/01/2015  6:52:23 AM      MDM   Final diagnoses:  Chest pain   Well-appearing patient presents with intermittent diaphoresis and chest discomfort. Patient is followed in atrial fibrillation clinic. Delay in troponin processing requested redraw. Page cardiology for discussion of post outpatient follow-up versus observation with diaphoresis concern. Per report patient had unremarkable stress test in the past year. Cardiolgoy consulted for admission.   The patients results and plan were reviewed and discussed.   Any x-rays performed were independently reviewed by myself.   Differential diagnosis were considered with the presenting HPI.  Medications  technetium sestamibi generic (CARDIOLITE) injection 10 milli Curie (10 milli Curies Intravenous Contrast Given 11/02/15 1140)  technetium sestamibi (CARDIOLITE) injection 30 milli Curie (30 milli Curies Intravenous Contrast Given 11/02/15 1315)    Filed Vitals:   11/02/15 1308 11/02/15 1310 11/02/15 1312 11/02/15 1403  BP:  215/99 206/96 121/76  Pulse: 108 130 139 65  Temp:    98.6 F (37 C)  TempSrc:    Oral  Resp:    18  Height:      Weight:      SpO2:    97%    Final diagnoses:  Chest pain    Admission/ observation were discussed with the admitting physician, patient and/or family and they are comfortable with the plan.      Elnora Morrison, MD 11/06/15 0730

## 2015-11-01 NOTE — H&P (Signed)
Chief Complaint: Chest Pain HPI:   Troy Acosta is an 65 y.o. male with h/o PAF, hypertension, hyperlipidemia, obesity, hypothyroidism. He first started noticing irregular hear tbeat around 9 months ago but was diagnosed after wearing a holter monitor in September. He was started on flecainide 50 mg bid and has not had any further issues with afib until Friday pm which lasted several hours and self converted. Patient was seen last Friday by Doristine Devoid, NP in A. fib clinic after he woke up in the am with afib.  He is compliant with xarelto.  She increased his flecainide to 75 mg twice daily continued metoprolol 12.5 twice a day.  She ordered an echocardiogram.   He presents today with complaints of profuse sweating and mild chest pressure. He's had approximate 3-4 episodes, in the last week, where he spontaneously breaks out in profuse sweating. It does not necessarily correlate with the 2 out of 10 chest pressure that he's been having as well. He has been belching when he has the chest pressure.  When these sweaty episodes occur, he also feels very hot with some dizziness/lightheadedness, weakness.  He's also had intermittent thoracic back pain(does not corollate with CP).  No nausea, vomiting, shortness of breath, lower extremity edema orthopnea.   He had an ETT last June and it was normal.  His last set of labs was in December 2016 TSH and T4 were within normal limits. LDL was 66.   Medications: Medication Sig  Cholecalciferol (VITAMIN D3) 1000 UNITS CAPS Take 1 capsule by mouth at bedtime.   FERREX 150 150 MG capsule TAKE ONE CAPSULE BY MOUTH ONE TIME DAILY Patient taking differently: TAKE ONE CAPSULE BY MOUTH every evening  flecainide (TAMBOCOR) 50 MG tablet Take 1.5 tablets (75 mg total) by mouth every 12 (twelve) hours.  hydrochlorothiazide (HYDRODIURIL) 25 MG tablet TAKE ONE TABLET BY MOUTH ONE  TIME DAILY  levothyroxine (LEVOTHROID) 25 MCG tablet Take 0.5 pill (12.5 mcg) every AM with  levothyroxine 150 mcg to total 162.87mg q day.  levothyroxine (SYNTHROID, LEVOTHROID) 150 MCG tablet Take 1 tablet (150 mcg total) by mouth daily before breakfast.  metoprolol tartrate (LOPRESSOR) 25 MG tablet Take 0.5 tablets (12.5 mg total) by mouth every 12 (twelve) hours.  olmesartan (BENICAR) 40 MG tablet Take 1 tablet (40 mg total) by mouth daily. Patient taking differently: Take 40 mg by mouth at bedtime.   rivaroxaban (XARELTO) 20 MG TABS tablet Take 1 tablet (20 mg total) by mouth daily with supper.  rosuvastatin (CRESTOR) 5 MG tablet Take 1 tablet (5 mg total) by mouth daily. Patient taking differently: Take 5 mg by mouth at bedtime.      Past Medical History  Diagnosis Date  . Diaphragmatic hernia without mention of obstruction or gangrene   . Unspecified hypothyroidism   . Malaise and fatigue   . [place]itis[place]   . Iron deficiency anemia, unspecified   . Obesity   . Hyperplasia of prostate   . Essential hypertension, benign   . Other and unspecified hyperlipidemia     History reviewed. No pertinent past surgical history.  Family History  Problem Relation Age of Onset  . Heart failure Mother   . Hypertension Mother   . Diabetes Mother   . Lung cancer Father   . COPD Father   . Tuberous sclerosis Father   . Heart disease Sister   . Cancer Brother   . Hypertension Sister   . Thyroid disease Sister   .  Crohn's disease Sister    Social History:  reports that he quit smoking about 22 years ago. His smoking use included Cigarettes. He has never used smokeless tobacco. He reports that he does not drink alcohol or use illicit drugs.  Allergies: No Known Allergies   (Not in a hospital admission)  Results for orders placed or performed during the hospital encounter of 11/01/15 (from the past 48 hour(s))  Basic metabolic panel     Status: Abnormal   Collection Time: 11/01/15  7:35 AM  Result Value Ref Range   Sodium 139 135 - 145 mmol/L   Potassium  4.5 3.5 - 5.1 mmol/L    Comment: SPECIMEN HEMOLYZED. HEMOLYSIS MAY AFFECT INTEGRITY OF RESULTS.   Chloride 102 101 - 111 mmol/L   CO2 27 22 - 32 mmol/L   Glucose, Bld 102 (H) 65 - 99 mg/dL   BUN 18 6 - 20 mg/dL   Creatinine, Ser 1.16 0.61 - 1.24 mg/dL   Calcium 9.6 8.9 - 10.3 mg/dL   GFR calc non Af Amer >60 >60 mL/min   GFR calc Af Amer >60 >60 mL/min    Comment: (NOTE) The eGFR has been calculated using the CKD EPI equation. This calculation has not been validated in all clinical situations. eGFR's persistently <60 mL/min signify possible Chronic Kidney Disease.    Anion gap 10 5 - 15  CBC     Status: None   Collection Time: 11/01/15  7:35 AM  Result Value Ref Range   WBC 10.1 4.0 - 10.5 K/uL   RBC 5.35 4.22 - 5.81 MIL/uL   Hemoglobin 16.2 13.0 - 17.0 g/dL   HCT 46.4 39.0 - 52.0 %   MCV 86.7 78.0 - 100.0 fL   MCH 30.3 26.0 - 34.0 pg   MCHC 34.9 30.0 - 36.0 g/dL   RDW 13.2 11.5 - 15.5 %   Platelets 173 150 - 400 K/uL  Troponin I     Status: None   Collection Time: 11/01/15  7:35 AM  Result Value Ref Range   Troponin I <0.03 <0.031 ng/mL    Comment:        NO INDICATION OF MYOCARDIAL INJURY.    Dg Chest 2 View  11/01/2015  CLINICAL DATA:  Onset of chest discomfort and diaphoresis last night continued into this morning, rapid heart rate, history of atrial fibrillation, hypertension, hyperlipidemia. EXAM: CHEST  2 VIEW COMPARISON:  PA and lateral chest x-ray of September 22, 2014 FINDINGS: The lungs are adequately inflated. There is no focal infiltrate. The lung markings are coarse in the perihilar regions bilaterally. This is more conspicuous than in the past. The heart is normal in size. There is no pulmonary vascular congestion. The mediastinum is normal in width. There is mild pleural thickening along the left lateral thoracic wall which is stable. There is multilevel degenerative disc space narrowing of the mid and lower thoracic spine. IMPRESSION: Mild bilateral interstitial  prominence new since the previous study. There is no alveolar infiltrate or pulmonary edema. Electronically Signed   By: David  Martinique M.D.   On: 11/01/2015 07:53    Review of Systems  Constitutional: Positive for diaphoresis. Negative for fever.  HENT: Negative for congestion and sore throat.   Eyes: Negative for blurred vision.  Respiratory: Negative for cough and shortness of breath.   Cardiovascular: Positive for chest pain ("Pressure"). Negative for palpitations, orthopnea, leg swelling and PND.  Gastrointestinal: Negative for nausea, vomiting, abdominal pain, blood in stool and melena.  Musculoskeletal: Positive for back pain. Negative for myalgias.  Neurological: Positive for dizziness and weakness. Negative for headaches.  All other systems reviewed and are negative.   Blood pressure 146/91, pulse 46, temperature 99.1 F (37.3 C), temperature source Oral, resp. rate 19, height 5' 11" (1.803 m), weight 240 lb (108.863 kg), SpO2 100 %. Physical Exam  Nursing note and vitals reviewed. Constitutional: He is oriented to person, place, and time. He appears well-developed and well-nourished. No distress.  HENT:  Head: Normocephalic and atraumatic.  Mouth/Throat: No oropharyngeal exudate.  Eyes: Conjunctivae and EOM are normal. Pupils are equal, round, and reactive to light.  Neck: Normal range of motion. Neck supple.  Cardiovascular: Regular rhythm, S1 normal and S2 normal.  Bradycardia present.   No murmur heard. Pulses:      Radial pulses are 2+ on the right side, and 2+ on the left side.       Dorsalis pedis pulses are 2+ on the right side, and 2+ on the left side.  No Carotid bruits  Respiratory: Effort normal and breath sounds normal. He has no wheezes. He has no rales. He exhibits no tenderness.  GI: Soft. Bowel sounds are normal. He exhibits no distension. There is no tenderness.  Musculoskeletal: He exhibits no edema.  Neurological: He is alert and oriented to person,  place, and time. He exhibits normal muscle tone.  Skin: Skin is warm and dry.  Psychiatric: He has a normal mood and affect.     Assessment/Plan Principal Problem:   Diaphoresis Active Problems:   Hypothyroidism   Essential hypertension, benign   Hyperlipidemia   Pain in the chest   PAF (paroxysmal atrial fibrillation) [facility])  Mr. Furia is a 65 year old male with history of paroxysmal atrial fibrillation, hyperlipidemia, hypertension, hypothyroidism who is presenting with diaphoresis and mild chest pressure. Troponin is negative is no acute EKG changes. Chest pressure does not necessarily coincide with diaphoresis, which is his main complaint.  Neither symptoms appears to be worse with exertion.  Blood pressure in the ER seems to be higher than it normally runs at home.  Will admit for observation. Monitor blood pressure and heart rate perhaps he is having breakthrough rates of A. fib with rapid rate.  He also be hypertensive or hypotensive at the time.  Just had a TSH and free T4 in December which were within normal limits. We'll recheck today.  There does seem to be some coincidence with recent increase in flecainide.  Consider rechecking a treadmill or Lexiscan.  He does have osteoarthritis in his knees which may limit his ability to walk on a treadmill although he was able to do a last June.    Angina, Vasovagal, Arrhythmia, hypertensive, Thyroid? Continue to cycle troponin. Recheck TSH, T4. He was scheduled for an outpatient echocardiogram today at 3:00. We will ordered here today.  Tarri Fuller, [facility] 11/01/2015, 10:30 AM   The patient was seen, examined and discussed with Tarri Fuller, PA-C and I agree with the above.   60 - year old male with known PAF, followed by a-fib clinic, recently seen on 10/26/15 when his flecainide was increased to 75 mg PO BID. He has no known CAD and had a negative stress test in 03/2015. He presented with episodes of diaphoresis and mild chest pressure. He states  that he had those for years, but feels that they are more frequent since Flecainide was increased.  His ECG shows normal PR, QRS intervals, otherwise normal, troponin is negative x3. He  has normal vital signs, denies palpitations during those episodes. He denies flushing or headaches during the episodes. His TSH and fT4 normal in Dec 2016 and fT4 just borderline today with normal TSH so not concerning.  His electrolytes are normal, BP normal, pheochromocytoma is low on differential. He doesn't have fever to suspect an acute illness. We will admit for observation and perform an exercise nuclear stress test to assess QRS after increasing flecainide and to evaluate for ischemia.   Dorothy Spark 11/01/2015

## 2015-11-02 ENCOUNTER — Encounter (HOSPITAL_COMMUNITY): Payer: BLUE CROSS/BLUE SHIELD

## 2015-11-02 ENCOUNTER — Observation Stay (HOSPITAL_BASED_OUTPATIENT_CLINIC_OR_DEPARTMENT_OTHER): Payer: BLUE CROSS/BLUE SHIELD

## 2015-11-02 ENCOUNTER — Other Ambulatory Visit: Payer: Self-pay | Admitting: Physician Assistant

## 2015-11-02 DIAGNOSIS — I4891 Unspecified atrial fibrillation: Secondary | ICD-10-CM | POA: Diagnosis not present

## 2015-11-02 DIAGNOSIS — R61 Generalized hyperhidrosis: Secondary | ICD-10-CM | POA: Diagnosis not present

## 2015-11-02 DIAGNOSIS — I158 Other secondary hypertension: Secondary | ICD-10-CM

## 2015-11-02 DIAGNOSIS — I480 Paroxysmal atrial fibrillation: Secondary | ICD-10-CM | POA: Diagnosis not present

## 2015-11-02 DIAGNOSIS — I10 Essential (primary) hypertension: Secondary | ICD-10-CM | POA: Diagnosis not present

## 2015-11-02 DIAGNOSIS — E785 Hyperlipidemia, unspecified: Secondary | ICD-10-CM

## 2015-11-02 DIAGNOSIS — R079 Chest pain, unspecified: Secondary | ICD-10-CM

## 2015-11-02 LAB — NM MYOCAR MULTI W/SPECT W/WALL MOTION / EF
CHL CUP MPHR: 158 {beats}/min
CHL CUP NUCLEAR SDS: 2
CHL CUP NUCLEAR SRS: 2
CHL CUP NUCLEAR SSS: 4
CHL RATE OF PERCEIVED EXERTION: 15
CSEPHR: 89 %
Estimated workload: 8.7 METS
Exercise duration (min): 7 min
Exercise duration (sec): 0 s
LHR: 0.33
LV sys vol: 53 mL
LVDIAVOL: 102 mL
Peak HR: 142 {beats}/min
Rest HR: 65 {beats}/min
TID: 1.04

## 2015-11-02 LAB — TROPONIN I: Troponin I: <0.03 ng/mL (ref <0.031)

## 2015-11-02 MED ORDER — TECHNETIUM TC 99M SESTAMIBI GENERIC - CARDIOLITE
10.0000 | Freq: Once | INTRAVENOUS | Status: AC | PRN
  Administered 2015-11-02: 10 via INTRAVENOUS

## 2015-11-02 MED ORDER — TECHNETIUM TC 99M SESTAMIBI - CARDIOLITE
30.0000 | Freq: Once | INTRAVENOUS | Status: AC | PRN
  Administered 2015-11-02: 13:00:00 30 via INTRAVENOUS

## 2015-11-02 MED ORDER — AMLODIPINE BESYLATE 2.5 MG PO TABS: 2.5000 mg | ORAL_TABLET | Freq: Every day | ORAL | Status: DC

## 2015-11-02 MED ORDER — OLMESARTAN MEDOXOMIL 40 MG PO TABS: 40.0000 mg | ORAL_TABLET | Freq: Every day | ORAL | Status: DC

## 2015-11-02 NOTE — Progress Notes (Signed)
Patient discharged home with wife. Instructions given. No questions at this time.

## 2015-11-02 NOTE — Discharge Summary (Signed)
Discharge Summary    Patient ID: Troy Acosta,  MRN: [id number], DOB/AGE: 01/07/1951 65 y.o.  Admit date: 11/01/2015 Discharge date: 11/05/2015  Primary Care Provider: Redge Gainer Primary Cardiologist: [facility]   Discharge Diagnoses    Principal Problem:   Diaphoresis Active Problems:   Hypothyroidism   Essential hypertension, benign   Hyperlipidemia   PAF (paroxysmal atrial fibrillation) (HCC)   Chest pain   Allergies No Known Allergies  Diagnostic Studies/Procedures    Treadmill Cardiolite  Overall Study Impression Myocardial perfusion is normal. The study is normal. This is a low risk study. Overall left ventricular systolic function was abnormal. LV cavity size is normal. The left ventricular ejection fraction is mildly decreased (45-54%). There is no prior study for comparison. _____________ Echocardiogram  Study Conclusions  - Left ventricle: The cavity size was normal. Wall thickness was increased in a pattern of mild LVH. Systolic function was mildly reduced. The estimated ejection fraction was in the range of 45% to 50%. Diffuse hypokinesis. Doppler parameters are consistent with abnormal left ventricular relaxation (grade 1 diastolic dysfunction). - Aortic valve: There was no stenosis. - Mitral valve: There was no significant regurgitation. - Left atrium: The atrium was mildly dilated. - Right ventricle: Poorly visualized. The cavity size was normal. Systolic function was mildly reduced. - Right atrium: The atrium was mildly dilated. - Pulmonary arteries: No TR doppler jet so unable to estimate PA systolic pressure. - Systemic veins: IVC was not visualized.  Impressions:  - Normal LV size with mild LV hypertrophy. EF 45-50% with diffuse hypokinesis. Normal RV size with mildly decreased systolic function. No significant valvular abnormalities. _____________ CHEST 2 VIEW  COMPARISON: PA and lateral chest x-ray of September 22, 2014  FINDINGS: The lungs are adequately inflated. There is no focal infiltrate. The lung markings are coarse in the perihilar regions bilaterally. This is more conspicuous than in the past. The heart is normal in size. There is no pulmonary vascular congestion. The mediastinum is normal in width. There is mild pleural thickening along the left lateral thoracic wall which is stable. There is multilevel degenerative disc space narrowing of the mid and lower thoracic spine.  IMPRESSION: Mild bilateral interstitial prominence new since the previous study. There is no alveolar infiltrate or pulmonary edema.   History of Present Illness      Troy Acosta is an 65 y.o. male with h/o PAF, hypertension, hyperlipidemia, obesity, hypothyroidism. He first started noticing irregular hear tbeat around 9 months ago but was diagnosed after wearing a holter monitor in September. He was started on flecainide 50 mg bid and has not had any further issues with afib until Friday pm which lasted several hours and self converted. Patient was seen last Friday by Doristine Devoid, NP in A. fib clinic after he woke up in the am with afib. He is compliant with xarelto. She increased his flecainide to 75 mg twice daily continued metoprolol 12.5 twice a day. He presented with complaints of profuse sweating and mild chest pressure. He's had approximate 3-4 episodes, in the last week, where he spontaneously breaks out in profuse sweating. It does not necessarily correlate with the 2 out of 10 chest pressure that he's been having as well. He has been belching when he has the chest pressure. When these sweaty episodes occur, he also feels very hot with some dizziness/lightheadedness, weakness. He's also had intermittent thoracic back pain(does not corollate with CP). No nausea, vomiting, shortness of breath, lower extremity edema orthopnea.  He had an ETT last June and it was normal. His last set of  labs was in December 2016 TSH and T4 were within normal limits. LDL was 66.  Hospital Course     Consultants: None   Mr. Hatter was admitted and ruled out for myocardial infarction.  TSH was within normal limits and free T4 was just above normal at 1.17.  He underwent treadmill Cardiolite which was read as low risk(his flecainide dose was increased recently). He did have an exaggerated response and blood pressure which peaked at 215/99.  Amlodipine 2.5 mg was added.  He will follow-up in the office and we'll arrange evaluation for pheochromocytoma and carcinoid syndrome.  He also had a 2-D echocardiogram which revealed an EF of 45-50% with diffuse hypokinesis, G1DD, LA mildly dilated.  The patient was seen by Dr. Meda Coffee who felt he was stable for DC home.  _____________  Discharge Vitals Blood pressure 121/76, pulse 65, temperature 98.6 F (37 C), temperature source Oral, resp. rate 18, height 5\' 11"  (1.803 m), weight 236 lb 1.6 oz (107.094 kg), SpO2 97 %.  Filed Weights   11/01/15 0704 11/01/15 1727 11/02/15 0330  Weight: 240 lb (108.863 kg) 239 lb 1.6 oz (108.455 kg) 236 lb 1.6 oz (107.094 kg)    Labs & Radiologic Studies     CBC No results for input(s): WBC, NEUTROABS, HGB, HCT, MCV, PLT in the last 72 hours. Basic Metabolic Panel No results for input(s): NA, K, CL, CO2, GLUCOSE, BUN, CREATININE, CALCIUM, MG, PHOS in the last 72 hours. Liver Function Tests No results for input(s): AST, ALT, ALKPHOS, BILITOT, PROT, ALBUMIN in the last 72 hours. No results for input(s): LIPASE, AMYLASE in the last 72 hours. Cardiac Enzymes No results for input(s): CKTOTAL, CKMB, CKMBINDEX, TROPONINI in the last 72 hours. Thyroid Function Tests No results for input(s): TSH, T4TOTAL, T3FREE, THYROIDAB in the last 72 hours.  Invalid input(s): FREET3Free T4: 1.17    Disposition   Pt is being discharged home today in good condition.  Follow-up Plans & Appointments    Follow-up Information     Follow up with Pixie Casino, MD On 12/06/2015.   Specialty:  Cardiology   Why:  4:00PM   Contact information:   [address] [place] [address] [phone]       Follow up with urine labs On 11/08/2015.   Why:  Go to lab and pick up specimen container.    Contact information:   [facility] [address] [place] [address] [phone]     Discharge Instructions    Diet - low sodium heart healthy    Complete by:  As directed            Discharge Medications   Discharge Medication List as of 11/02/2015  4:34 PM    START taking these medications   Details  amLODipine (NORVASC) 2.5 MG tablet Take 1 tablet (2.5 mg total) by mouth daily., Starting 11/02/2015, Until Discontinued, Normal      CONTINUE these medications which have CHANGED   Details  olmesartan (BENICAR) 40 MG tablet Take 1 tablet (40 mg total) by mouth at bedtime., Starting 11/02/2015, Until Discontinued, No Print      CONTINUE these medications which have NOT CHANGED   Details  Cholecalciferol (VITAMIN D3) 1000 UNITS CAPS Take 1 capsule by mouth at bedtime. , Until Discontinued, Historical Med    FERREX 150 150 MG capsule TAKE ONE CAPSULE BY MOUTH ONE TIME  DAILY, Normal    flecainide (TAMBOCOR) 50 MG tablet Take 1.5 tablets (75 mg total) by mouth every 12 (twelve) hours., Starting 10/26/2015, Until Discontinued, No Print    hydrochlorothiazide (HYDRODIURIL) 25 MG tablet TAKE ONE TABLET BY MOUTH ONE  TIME DAILY, Normal    !! levothyroxine (LEVOTHROID) 25 MCG tablet Take 0.5 pill (12.5 mcg) every AM with levothyroxine 150 mcg to total 162.70mcg q day., Print    !! levothyroxine (SYNTHROID, LEVOTHROID) 150 MCG tablet Take 1 tablet (150 mcg total) by mouth daily before breakfast., Starting 10/05/2015, Until Discontinued, Print    metoprolol tartrate (LOPRESSOR) 25 MG tablet Take 0.5 tablets (12.5 mg total) by mouth every 12 (twelve) hours., Starting 10/05/2015, Until Discontinued,  Print    rivaroxaban (XARELTO) 20 MG TABS tablet Take 1 tablet (20 mg total) by mouth daily with supper., Starting 10/05/2015, Until Discontinued, Print    rosuvastatin (CRESTOR) 5 MG tablet Take 1 tablet (5 mg total) by mouth daily., Starting 10/05/2015, Until Discontinued, Print     !! - Potential duplicate medications found. Please discuss with provider.       Aspirin prescribed at discharge?  No High Intensity Statin Prescribed? (Lipitor 40-80mg  or Crestor 20-40mg ): No Beta Blocker Prescribed? Yes For EF 45% or less: No , Was ACEI/ARB Prescribed? Yes  ADP Receptor Inhibitor Prescribed? (i.e. Plavix etc.-Includes Medically Managed Patients): No For EF <40%, Aldosterone Inhibitor Prescribed? No:  Was EF assessed during THIS hospitalization? Yes Was Cardiac Rehab II ordered? (Included Medically managed Patients): No   Outstanding Labs/Studies   None  Duration of Discharge Encounter   Greater than 30 minutes including physician time.  Otilio Connors, [place] 11/05/2015, 7:55 AM

## 2015-11-02 NOTE — Progress Notes (Signed)
1 day treadmill nuc completed without significant complication. Pending final result by [facility] reader  Signed, Almyra Deforest PA Pager: [phone]

## 2015-11-02 NOTE — Progress Notes (Addendum)
Subjective:  No CP or diaphoresis  Objective: Vital signs in last 24 hours: Temp:  [97.9 F (36.6 C)-98.4 F (36.9 C)] 98.3 F (36.8 C) (01/24 0330) Pulse Rate:  [46-60] 60 (01/24 0330) Resp:  [14-20] 16 (01/23 1727) BP: (107-157)/(66-99) 117/66 mmHg (01/24 0330) SpO2:  [94 %-100 %] 97 % (01/24 0330) Weight:  [236 lb 1.6 oz (107.094 kg)-239 lb 1.6 oz (108.455 kg)] 236 lb 1.6 oz (107.094 kg) (01/24 0330) Last BM Date: 11/01/15  Intake/Output from previous day:   Intake/Output this shift:   Medications Scheduled Meds: . aspirin EC  81 mg Oral Daily  . flecainide  75 mg Oral Q12H  . hydrochlorothiazide  25 mg Oral Daily  . irbesartan  300 mg Oral Daily  . iron polysaccharides  150 mg Oral QPM  . levothyroxine  162.5 mcg Oral QAC breakfast  . metoprolol tartrate  12.5 mg Oral Q12H  . rivaroxaban  20 mg Oral Q supper  . rosuvastatin  5 mg Oral QHS   Continuous Infusions:  PRN Meds:.acetaminophen, nitroGLYCERIN, ondansetron (ZOFRAN) IV  PE: General appearance: alert, cooperative and no distress Lungs: clear to auscultation bilaterally Heart: regular rate and rhythm, S1, S2 normal, no murmur, click, rub or gallop Extremities: No LEE Pulses: 2+ and symmetric Skin: Warm and dry Neurologic: Grossly normal  Lab Results:   Recent Labs  11/01/15 0735  WBC 10.1  HGB 16.2  HCT 46.4  PLT 173   BMET  Recent Labs  11/01/15 0735  NA 139  K 4.5  CL 102  CO2 27  GLUCOSE 102*  BUN 18  CREATININE 1.16  CALCIUM 9.6   Cardiac Panel (last 3 results)  Recent Labs  11/01/15 1219 11/01/15 1822 11/01/15 2335  TROPONINI <0.03 <0.03 <0.03     Assessment/Plan   Principal Problem:   Diaphoresis Active Problems:   Hypothyroidism   Essential hypertension, benign   Hyperlipidemia   PAF (paroxysmal atrial fibrillation) (HCC)   Chest pain   No CP or diaphoresis overnight. Ruled out for MI.  Bp controlled.  Free T4 1.17.  Normal TSH.  Treadmill cardiolite  today.  Sinus brady on tele.  Flecainide 75 bid, metoprolol 12.5 BID(held for treadmill), Xarelto. Crestor.   Echo pending.  Likely DC home today.   Tarri Fuller PA-C 11/02/2015 8:16 AM   The patient was seen, examined and discussed with Tarri Fuller, PA-C and I agree with the above.   65 - year old male with known PAF, followed by a-fib clinic, recently seen on 10/26/15 when his flecainide was increased to 75 mg PO BID. He has no known CAD and had a negative stress test in 03/2015. He presented with episodes of diaphoresis and mild chest pressure. He states that he had those for years, but feels that they are more frequent since Flecainide was increased.  His ECG shows normal PR, QRS intervals, otherwise normal, troponin is negative x3. He has normal vital signs, denies palpitations during those episodes. He denies flushing or headaches during the episodes. His TSH and fT4 normal in Dec 2016 and fT4 just borderline today with normal TSH so not concerning.  His electrolytes are normal, BP normal, pheochromocytoma is low on differential, but we can schedule an outpatient urine collection for A) pheochromocytoma - urine catecholamines B) carcinoid syndrome - urine 5 HIAA  He doesn't have fever to suspect an acute illness. We will perform an exercise nuclear stress test to assess QRS after increasing flecainide and to evaluate  for ischemia today. If normal we will discharge and follow up as outpatient.  Dorothy Spark 11/02/2015  The stress test was negative for infarct or ischemia, no change in QRS length with exercise. The patient had significant hypertensive response to stress with max BP 215 mmHg, we will add amlodipine 2.5 mg po daily and follow up in the clinic.  His echocardiogram shows moderate concentric LVH, diffuse hypokinesis with LVEF 40-45%.  We will arrange for the evaluation of pheochromocytoma and carcinoid syndrome.   Dorothy Spark 11/02/2015

## 2015-11-02 NOTE — Progress Notes (Signed)
  Echocardiogram 2D Echocardiogram has been performed.  Diamond Nickel 11/02/2015, 11:48 AM

## 2015-11-03 ENCOUNTER — Telehealth: Payer: Self-pay | Admitting: *Deleted

## 2015-11-03 ENCOUNTER — Ambulatory Visit (HOSPITAL_COMMUNITY): Payer: BLUE CROSS/BLUE SHIELD | Admitting: Nurse Practitioner

## 2015-11-03 ENCOUNTER — Telehealth: Payer: Self-pay | Admitting: Family Medicine

## 2015-11-03 NOTE — Telephone Encounter (Signed)
Spoke with pt, aware to stop the flecainide. Aware of follow up appt.

## 2015-11-03 NOTE — Telephone Encounter (Signed)
-----   Message from Pixie Casino, MD sent at 11/03/2015  2:22 PM EST ----- Regarding: FW: ? continue flecainide Please advise Troy Acosta to stop his flecainide - we will have to evaluate alternatives to use given the fact that his LV function is reduced. It is not safe to use flecainide in this situation. I will discuss alternatives at his follow-up appointment with me. He should stay on metoprolol for rate control.  Dr. Lemmie Evens  ----- Message -----    From: Sherran Needs, NP    Sent: 11/03/2015   1:18 PM      To: Pixie Casino, MD Subject: RE: ? continue flecainide                      I didn't start but increased from 50 mg to 75 mg because he came in with afib and ordered echo, which was done in the hospital. But yes, need to stop and choose another antiarrythmic or refer for ablation. Thanks. ----- Message -----    From: Pixie Casino, MD    Sent: 11/03/2015  11:53 AM      To: Sherran Needs, NP Subject: ? continue flecainide                          Troy Acosta started Troy Acosta recently on flecainide - he was just hospitalized after a dose increase. He noted chest pain, flushing and other symptoms. A stress test was negative for ischemia, however, EF was between 40-45% by nuclear and echo. He has a follow-up scheduled with me in on 2/27. I'm leery about using flecainide in a patient with "Structural" heart disease - decreased LV function. I think we should stop it, would you agree?  Dr. Debara Pickett

## 2015-11-03 NOTE — Telephone Encounter (Signed)
Pt called [facility]eds hosp follow up - scheduled  Will start amlodipine Friday morning

## 2015-11-04 ENCOUNTER — Telehealth: Payer: Self-pay | Admitting: Internal Medicine

## 2015-11-04 NOTE — Telephone Encounter (Signed)
Line busy when dialed. Listed home line goes unanswered.

## 2015-11-04 NOTE — Telephone Encounter (Signed)
NeW Message  Pt wife requesting to speak w/ RN concerning pt coming off certain medications. Please call back and discuss.

## 2015-11-04 NOTE — Telephone Encounter (Signed)
Returned call to wife. She stated concerns - wanted to address the med changes as well as follow up tests - noted pt recent hospitalization - asked if pt can be seen sooner than the 2/27 appt. Advised it was probably OK to wait till this appt based on Dr. Lysbeth Penner notes, but was happy to provide sooner appt if available. Openings for tomorrow morning - pt scheduled for 1/27 - confirmed on phone. Wife was satisfied w/ this arrangement and they will address their questions w/ Dr. Debara Pickett tomorrow.

## 2015-11-04 NOTE — Telephone Encounter (Signed)
followup      Wife calling back to speak with nurse.

## 2015-11-05 ENCOUNTER — Encounter: Payer: Self-pay | Admitting: Internal Medicine

## 2015-11-05 ENCOUNTER — Ambulatory Visit (INDEPENDENT_AMBULATORY_CARE_PROVIDER_SITE_OTHER): Payer: BLUE CROSS/BLUE SHIELD | Admitting: Internal Medicine

## 2015-11-05 ENCOUNTER — Telehealth: Payer: Self-pay | Admitting: Family Medicine

## 2015-11-05 VITALS — BP 126/82 | HR 54 | Ht 71.0 in | Wt 239.4 lb

## 2015-11-05 DIAGNOSIS — R5383 Other fatigue: Secondary | ICD-10-CM

## 2015-11-05 DIAGNOSIS — I480 Paroxysmal atrial fibrillation: Secondary | ICD-10-CM | POA: Diagnosis not present

## 2015-11-05 DIAGNOSIS — R5381 Other malaise: Secondary | ICD-10-CM | POA: Diagnosis not present

## 2015-11-05 DIAGNOSIS — I429 Cardiomyopathy, unspecified: Secondary | ICD-10-CM | POA: Diagnosis not present

## 2015-11-05 DIAGNOSIS — Z7901 Long term (current) use of anticoagulants: Secondary | ICD-10-CM

## 2015-11-05 DIAGNOSIS — I4891 Unspecified atrial fibrillation: Secondary | ICD-10-CM | POA: Diagnosis not present

## 2015-11-05 DIAGNOSIS — I428 Other cardiomyopathies: Secondary | ICD-10-CM

## 2015-11-05 NOTE — Telephone Encounter (Signed)
Pt has follow up appt 11/10/15 with Dr.Moore. He will discuss everything at that appt.

## 2015-11-05 NOTE — Patient Instructions (Addendum)
Dr Debara Pickett has referred you to Dr [name], an electrophysiologist, for consideration of ablation.

## 2015-11-07 DIAGNOSIS — Z7901 Long term (current) use of anticoagulants: Secondary | ICD-10-CM | POA: Insufficient documentation

## 2015-11-07 DIAGNOSIS — I428 Other cardiomyopathies: Secondary | ICD-10-CM | POA: Insufficient documentation

## 2015-11-07 NOTE — Progress Notes (Signed)
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 They did perform an EKG which was normal. There was a suggestion to see a cardiologist. Troy Acosta has history of hypertension and dyslipidemia. There is a family history of heart failure in his mother. He's had no anginal symptoms or worsening shortness of breath or fatigue since this episode. I wonder if he possibly had an episode of atrial fibrillation. He denied any palpitations or fluttering but did feel like a pressure across his chest. He reports good sleep at night and says that his wife does not report he snores or stops breathing.  Troy Acosta returns today for follow-up. I recently placed him on a monitor and this did confirm my suspicion that he has proximal atrial fibrillation. In fact he had an episode at rest where his heart rate went up to almost 180. This eventually subsided without therapy and he did call EMS to capture the A. fib but he declined going to the hospital. He did call the office and we advised she start on anticoagulation. Today's back in sinus rhythm with a controlled rate.  I saw Troy Acosta back in the office today for follow-up. His last office visit I started him on diltiazem for rate control. He reports he's had 2 other episodes of A. fib both of which cause significant fatigue although his heart rate was not elevated above 100 he was very symptomatic. He said the next day he felt very tired and is generally very uncomfortable with his A. fib. Despite good rate control, it seems that he is in need of rhythm control. He has had some intermittent episodes of chest discomfort and we did do an exercise treadmill stress test a few months ago which  was negative for ischemia.  Troy Acosta returns today for follow-up. Overall he is very pleased without is doing on flecainide. He's only had one short episode of A. fib but he said it resolved rather quickly. He's been able to go hunting and do other activities and is been not bothered by palpitations. Blood pressure heart rate seem to be well-controlled. His EKG in the office today shows sinus bradycardia at 54 with a QTC of 383 ms.  Troy Acosta returns today for hospital follow-up. Unfortunately he presented to the ER with palpitations and symptomatic atrial flutter which ultimately resolved. I previously started him on flecainide 50 mg twice daily after he had exercise treadmill stress testing which indicated no evidence for ischemia. He had good exercise tolerance and no signs or symptoms of heart failure. He was having breakthrough atrial fibrillation and therefore was seen in the A. Fib clinic by Roderic Palau, NP. She increased his flecainide to 75 mg twice a day and ordered an echocardiogram for the purposes of planning possible ablation. Subsequently he was admitted with chest pain and ruled out for MI. He underwent a nuclear stress test which was negative for ischemia but did show an EF of 48%. He also underwent a 2-D echocardiogram which showed global hypokinesis and EF of 45-50%. It was mild LVH. Based on those findings I recommended that he stop flecainide immediately, as it's contraindicated in patients with structural heart disease. I suspect he  may have developed his cardiomyopathy secondary to recurrent atrial fibrillation. He is quite concerned about options for keeping him in sinus rhythm. Since he is symptomatic, antiarrhythmic therapy or ablation seem to be the only options at this point. He has limited choices including possibly Multaq, Amiodarone or Tikosyn. Another issue is the fact that he tends to be bradycardic at rest. EKG shows sinus rhythm today.  PMHx:  Past Medical History    Diagnosis Date  . Diaphragmatic hernia without mention of obstruction or gangrene   . Unspecified hypothyroidism   . Malaise and fatigue   . Arteritis, unspecified ([place])   . Iron deficiency anemia, unspecified   . Obesity   . Hyperplasia of prostate   . Essential hypertension, benign   . Other and unspecified hyperlipidemia     No past surgical history on file.  FAMHx:  Family History  Problem Relation Age of Onset  . Heart failure Mother   . Hypertension Mother   . Diabetes Mother   . Lung cancer Father   . COPD Father   . Tuberous sclerosis Father   . Heart disease Sister   . Cancer Brother   . Hypertension Sister   . Thyroid disease Sister   . Crohn's disease Sister     SOCHx:   reports that he quit smoking about 22 years ago. His smoking use included Cigarettes. He has never used smokeless tobacco. He reports that he does not drink alcohol or use illicit drugs.  ALLERGIES:  No Known Allergies  ROS: A comprehensive review of systems was negative except for: Cardiovascular: positive for palpitations  HOME MEDS: Current Outpatient Prescriptions  Medication Sig Dispense Refill  . amLODipine (NORVASC) 2.5 MG tablet Take 1 tablet (2.5 mg total) by mouth daily. 30 tablet 5  . Cholecalciferol (VITAMIN D3) 1000 UNITS CAPS Take 1 capsule by mouth at bedtime.     Marland Kitchen FERREX 150 150 MG capsule TAKE ONE CAPSULE BY MOUTH ONE TIME DAILY (Patient taking differently: TAKE ONE CAPSULE BY MOUTH every evening) 90 capsule 1  . hydrochlorothiazide (HYDRODIURIL) 25 MG tablet TAKE ONE TABLET BY MOUTH ONE  TIME DAILY 90 tablet 1  . levothyroxine (LEVOTHROID) 25 MCG tablet Take 0.5 pill (12.5 mcg) every AM with levothyroxine 150 mcg to total 162.60mcg q day. 45 tablet 6  . levothyroxine (SYNTHROID, LEVOTHROID) 150 MCG tablet Take 1 tablet (150 mcg total) by mouth daily before breakfast. 90 tablet 3  . metoprolol tartrate (LOPRESSOR) 25 MG tablet Take 0.5 tablets (12.5 mg total) by mouth  every 12 (twelve) hours. 90 tablet 3  . olmesartan (BENICAR) 40 MG tablet Take 1 tablet (40 mg total) by mouth at bedtime. 90 tablet 3  . rivaroxaban (XARELTO) 20 MG TABS tablet Take 1 tablet (20 mg total) by mouth daily with supper. 90 tablet 3  . rosuvastatin (CRESTOR) 5 MG tablet Take 1 tablet (5 mg total) by mouth daily. (Patient taking differently: Take 5 mg by mouth at bedtime. ) 90 tablet 3   No current facility-administered medications for this visit.    LABS/IMAGING: No results found for this or any previous visit (from the past 48 hour(s)). No results found.  WEIGHTS: Wt Readings from Last 3 Encounters:  11/05/15 239 lb 7 oz (108.608 kg)  11/02/15 236 lb 1.6 oz (107.094 kg)  10/26/15 242 lb 6.4 oz (109.952 kg)    VITALS: BP 126/82 mmHg  Pulse 54  Ht 5\' 11"  (1.803 m)  Wt 239 lb 7 oz (108.608  kg)  BMI 33.41 kg/m2  EXAM: Deferred  EKG: Sinus bradycardia 54, QTc 396 msec  ASSESSMENT: 1. Paroxysmal atrial fibrillation - CHADSVASC score of 1 (on Eliquis) 2. Mild non-ischemic cardiomyopathy EF 45-50%, mild LVH 3. Fatigue, shortness of breath and chest pain - low risk stress myoview 4. Hypertension 5. Dyslipidemia  PLAN: 1.   Troy Acosta have breakthrough atrial fibrillation on flecainide despite an increased dose from 50-75 mg twice a day. It was then determined that he had a mild nonischemic cardiomyopathy. He reports no further chest pain however I feel it is highly symptomatic with his A. Fib. We talked about other options for rhythm control including only a small number of antiarrhythmic medicines that could be indicated in structural heart disease, those include amiodarone, dronedarone, and dofetilide. I'm concerned about his bradycardia and concomitant use of amiodarone or dronedarone with lowering HR. We would have to stop his beta blocker to allow this. Dofetilide, is an option however he was somewhat resistant to be hospitalized for the medication. We also discussed  the possibility of ablation. He says he had a friend who had an ablation and is done very well and he was more interested in talking about this. I like to refer him to Dr. Rayann Heman for his opinion as to whether he is a candidate for A. Fib ablation.  Pixie Casino, MD, [facility] Attending Cardiologist [place] C Briona Korpela 11/07/2015, 4:19 PM

## 2015-11-10 ENCOUNTER — Encounter: Payer: Self-pay | Admitting: Family Medicine

## 2015-11-10 ENCOUNTER — Encounter (INDEPENDENT_AMBULATORY_CARE_PROVIDER_SITE_OTHER): Payer: BLUE CROSS/BLUE SHIELD | Admitting: Family Medicine

## 2015-11-10 ENCOUNTER — Encounter: Payer: Self-pay | Admitting: *Deleted

## 2015-11-10 ENCOUNTER — Ambulatory Visit (INDEPENDENT_AMBULATORY_CARE_PROVIDER_SITE_OTHER): Payer: BLUE CROSS/BLUE SHIELD | Admitting: Internal Medicine

## 2015-11-10 ENCOUNTER — Encounter: Payer: Self-pay | Admitting: Internal Medicine

## 2015-11-10 ENCOUNTER — Other Ambulatory Visit: Payer: Self-pay | Admitting: *Deleted

## 2015-11-10 VITALS — BP 118/80 | HR 62 | Ht 71.0 in | Wt 239.0 lb

## 2015-11-10 DIAGNOSIS — I481 Persistent atrial fibrillation: Secondary | ICD-10-CM | POA: Diagnosis not present

## 2015-11-10 DIAGNOSIS — I10 Essential (primary) hypertension: Secondary | ICD-10-CM | POA: Diagnosis not present

## 2015-11-10 DIAGNOSIS — I429 Cardiomyopathy, unspecified: Secondary | ICD-10-CM

## 2015-11-10 DIAGNOSIS — Z01812 Encounter for preprocedural laboratory examination: Secondary | ICD-10-CM

## 2015-11-10 DIAGNOSIS — I428 Other cardiomyopathies: Secondary | ICD-10-CM

## 2015-11-10 DIAGNOSIS — I4819 Other persistent atrial fibrillation: Secondary | ICD-10-CM

## 2015-11-10 DIAGNOSIS — I158 Other secondary hypertension: Secondary | ICD-10-CM

## 2015-11-10 DIAGNOSIS — I480 Paroxysmal atrial fibrillation: Secondary | ICD-10-CM

## 2015-11-10 MED ORDER — LORAZEPAM 0.5 MG PO TABS: ORAL_TABLET | ORAL | Status: DC

## 2015-11-10 NOTE — Patient Instructions (Signed)
Medication Instructions:  Your physician recommends that you continue on your current medications as directed. Please refer to the Current Medication list given to you today.  Labwork: Your physician recommends that you return for pre procedure lab work within 7-14 days prior to procedure on 12/24/15.  Testing/Procedures: Your physician has requested that you have cardiac CT (within 7 days of ablation). Cardiac computed tomography (CT) is a painless test that uses an x-ray machine to take clear, detailed pictures of your heart. For further information please visit [facility]. Please follow instruction sheet as given.  Your physician has recommended that you have an ablation. Catheter ablation is a medical procedure used to treat some cardiac arrhythmias (irregular heartbeats). During catheter ablation, a long, thin, flexible tube is put into a blood vessel in your groin (upper thigh), or neck. This tube is called an ablation catheter. It is then guided to your heart through the blood vessel. Radio frequency waves destroy small areas of heart tissue where abnormal heartbeats may cause an arrhythmia to start. Please see the instruction sheet given to you today.  Follow-Up: Your physician recommends that you schedule a follow-up appointment in: 4 weeks, post ablation on 12/24/15, with Roderic Palau, NP.  Your physician recommends that you schedule a follow-up appointment in: 3 months, post ablation on 12/24/15, with Dr. Rayann Heman.  If you need a refill on your cardiac medications before your next appointment, please call your pharmacy.  Thank you for choosing [facility]!!     Cardiac Ablation Cardiac ablation is a procedure to disable a small amount of heart tissue in very specific places. The heart has many electrical connections. Sometimes these connections are abnormal and can cause the heart to beat very fast or irregularly. By disabling some of the problem areas, heart rhythm can be  improved or made normal. Ablation is done for people who:   Have Wolff-Parkinson-White syndrome.   Have other fast heart rhythms (tachycardia).   Have taken medicines for an abnormal heart rhythm (arrhythmia) that resulted in:   No success.   Side effects.   May have a high-risk heartbeat that could result in death.  LET [facility] CARE PROVIDER KNOW ABOUT:   Any allergies you have or any previous reactions you have had to X-ray dye, food (such as seafood), medicine, or tape.   All medicines you are taking, including vitamins, herbs, eye drops, creams, and over-the-counter medicines.   Previous problems you or members of your family have had with the use of anesthetics.   Any blood disorders you have.   Previous surgeries or procedures (such as a kidney transplant) you have had.   Medical conditions you have (such as kidney failure).  RISKS AND COMPLICATIONS Generally, cardiac ablation is a safe procedure. However, problems can occur and include:   Increased risk of cancer. Depending on how long it takes to do the ablation, the dose of radiation can be high.  Bruising and bleeding where a thin, flexible tube (catheter) was inserted during the procedure.   Bleeding into the chest, especially into the sac that surrounds the heart (serious).  Need for a permanent pacemaker if the normal electrical system is damaged.   The procedure may not be fully effective, and this may not be recognized for months. Repeat ablation procedures are sometimes required. BEFORE THE PROCEDURE   Follow any instructions from your health care provider regarding eating and drinking before the procedure.   Take your medicines as directed at regular times with water,  unless instructed otherwise by your health care provider. If you are taking diabetes medicine, including insulin, ask how you are to take it and if there are any special instructions you should follow. It is common to adjust  insulin dosing the day of the ablation.  PROCEDURE  An ablation is usually performed in a catheterization laboratory with the guidance of fluoroscopy. Fluoroscopy is a type of X-ray that helps your health care provider see images of your heart during the procedure.   An ablation is a minimally invasive procedure. This means a small cut (incision) is made in either your neck or groin. Your health care provider will decide where to make the incision based on your medical history and physical exam.  An IV tube will be started before the procedure begins. You will be given an anesthetic or medicine to help you relax (sedative).  The skin on your neck or groin will be numbed. A needle will be inserted into a large vein in your neck or groin and catheters will be threaded to your heart.  A special dye that shows up on fluoroscopy pictures may be injected through the catheter. The dye helps your health care provider see the area of the heart that needs treatment.  The catheter has electrodes on the tip. When the area of heart tissue that is causing the arrhythmia is found, the catheter tip will send an electrical current to the area and "scar" the tissue. Three types of energy can be used to ablate the heart tissue:   Heat (radiofrequency energy).   Laser energy.   Extreme cold (cryoablation).   When the area of the heart has been ablated, the catheter will be taken out. Pressure will be held on the insertion site. This will help the insertion site clot and keep it from bleeding. A bandage will be placed on the insertion site.  AFTER THE PROCEDURE   After the procedure, you will be taken to a recovery area where your vital signs (blood pressure, heart rate, and breathing) will be monitored. The insertion site will also be monitored for bleeding.   You will need to lie still for 4-6 hours. This is to ensure you do not bleed from the catheter insertion site.    This information is not  intended to replace advice given to you by your health care provider. Make sure you discuss any questions you have with your health care provider.   Document Released: 02/11/2009 Document Revised: 10/16/2014 Document Reviewed: 02/17/2013 Elsevier Interactive Patient Education [employer].

## 2015-11-10 NOTE — Progress Notes (Signed)
Troy Acosta,  Pt complains of sweating and DWM wonders if it could be caused by any of his medications?? Please review - thanks

## 2015-11-10 NOTE — Progress Notes (Deleted)
   Subjective:    Patient ID: Troy Acosta, male    DOB: 09/09/1951, 65 y.o.   MRN: [id number]  HPI Patient here today for hospital follow up from [facility] where he was seen for chest pain.       Patient Active Problem List   Diagnosis Date Noted  . Non-ischemic cardiomyopathy ([place]) 11/07/2015  . Long term (current) use of anticoagulants 11/07/2015  . Diaphoresis 11/01/2015  . Chest pain 11/01/2015  . PAF (paroxysmal atrial fibrillation) ([place]) 07/15/2015  . Heart palpitations 07/11/2015  . Dyspnea 02/18/2015  . Anemia, iron deficiency 09/10/2013  . Diaphragmatic hernia without mention of obstruction or gangrene   . Hypothyroidism   . Malaise and fatigue   . Arteritis, unspecified ([place])   . Iron deficiency anemia, unspecified   . Obesity   . BPH (benign prostatic hyperplasia)   . Essential hypertension, benign   . Hyperlipidemia    Outpatient Encounter Prescriptions as of 11/10/2015  Medication Sig  . Cholecalciferol (VITAMIN D3) 1000 UNITS CAPS Take 1 capsule by mouth at bedtime.   Marland Kitchen FERREX 150 150 MG capsule TAKE ONE CAPSULE BY MOUTH ONE TIME DAILY (Patient taking differently: TAKE ONE CAPSULE BY MOUTH every evening)  . hydrochlorothiazide (HYDRODIURIL) 25 MG tablet TAKE ONE TABLET BY MOUTH ONE  TIME DAILY  . levothyroxine (LEVOTHROID) 25 MCG tablet Take 0.5 pill (12.5 mcg) every AM with levothyroxine 150 mcg to total 162.17mcg q day.  . levothyroxine (SYNTHROID, LEVOTHROID) 150 MCG tablet Take 1 tablet (150 mcg total) by mouth daily before breakfast.  . metoprolol tartrate (LOPRESSOR) 25 MG tablet Take 0.5 tablets (12.5 mg total) by mouth every 12 (twelve) hours.  Marland Kitchen olmesartan (BENICAR) 40 MG tablet Take 1 tablet (40 mg total) by mouth at bedtime.  . rivaroxaban (XARELTO) 20 MG TABS tablet Take 1 tablet (20 mg total) by mouth daily with supper.  . rosuvastatin (CRESTOR) 5 MG tablet Take 1 tablet (5 mg total) by mouth daily. (Patient taking differently: Take 5 mg by mouth at bedtime. )   . [DISCONTINUED] amLODipine (NORVASC) 2.5 MG tablet Take 1 tablet (2.5 mg total) by mouth daily.   No facility-administered encounter medications on file as of 11/10/2015.      Review of Systems  Constitutional: Negative.   HENT: Negative.   Eyes: Negative.   Respiratory: Negative.   Cardiovascular: Negative.   Gastrointestinal: Negative.   Endocrine: Negative.   Genitourinary: Negative.   Musculoskeletal: Negative.   Skin: Negative.   Allergic/Immunologic: Negative.   Neurological: Negative.   Hematological: Negative.   Psychiatric/Behavioral: Negative.        Objective:   Physical Exam BP 127/82 mmHg  Pulse 63  Temp(Src) 97.2 F (36.2 C) (Oral)  Ht 5\' 11"  (1.803 m)  Wt 238 lb (107.956 kg)  BMI 33.21 kg/m2        Assessment & Plan:

## 2015-11-10 NOTE — Progress Notes (Signed)
Patient ID: Troy Acosta, male   DOB: [birth year], 65 y.o.   MRN: [id number] Both metoprolol and levothyroxine have associated with increased / excessive sweating.  It is also possible that the sweating is linked to [name] fluctuating HR / atrial fibrillation.  Also his last thyroid panel showed normal TSH but slightly high T4. Elevated T4 might be exacerbating flushing or sweating associated with atrial fibrillation.  From cardiology's last note it appears they are looking into treatment options for atrial fibrillation which might include lowering the dose of metoprolol. Could try stopping 1/2 tablet of levothyroxine 58mcg and continue levothyroxine 111mcg daily, then recheck thyroid panel in 6 to 8 weeks.

## 2015-11-10 NOTE — Progress Notes (Signed)
This encounter was created in error - please disregard.

## 2015-11-11 NOTE — Progress Notes (Signed)
Please review this note with the patient and for a copy of this note to Dr. Rayann Heman for his information and we will discuss this with the patient when we see him at the next visit if he continues to have problems with sweating and flushing. The cardiologist may consider some medication changes also before that time.

## 2015-11-12 ENCOUNTER — Encounter: Payer: Self-pay | Admitting: Internal Medicine

## 2015-11-12 NOTE — Progress Notes (Signed)
Electrophysiology Office Note   Date:  11/12/2015   ID:  Troy Acosta, DOB 11/28/1950, MRN [id number]  PCP:  Redge Gainer, MD  Cardiologist:  Dr Debara Pickett Primary Electrophysiologist: [name], MD    Chief Complaint  Patient presents with  . Atrial Fibrillation     History of Present Illness: Troy Acosta is a 65 y.o. male who presents today for electrophysiology evaluation.   He has paroxysmal atrial fibrillation.  He reports initially having tachypalpitations for about 9 months.  He was evaluated and had a holter placed in September.  This has documented atrial fibrillation.  He has been treated with flecainide.  He has had recent recurrence of afib despite medical therapy with flecainide.  He reports symptoms of fatigue and decreased exercise tolerance in afib.  He had an echo obtained which revealed EF 45%.  His flecainide has since been discontinued by Dr Debara Pickett.   He is not drinking any alcohol, minimal caffeine, wife denies snoring.He cannot identify any triggers. He is compliant with xarelto.  He is referred for consideration of ablation.  Today, he denies symptoms of chest pain, shortness of breath, orthopnea, PND, lower extremity edema, claudication, dizziness, presyncope, syncope, bleeding, or neurologic sequela. The patient is tolerating medications without difficulties and is otherwise without complaint today.    Past Medical History  Diagnosis Date  . Diaphragmatic hernia without mention of obstruction or gangrene   . Unspecified hypothyroidism   . Malaise and fatigue   . Arteritis, unspecified ([place])   . Iron deficiency anemia, unspecified   . Obesity   . Hyperplasia of prostate   . Essential hypertension, benign   . Other and unspecified hyperlipidemia    No past surgical history on file.   Current Outpatient Prescriptions  Medication Sig Dispense Refill  . Cholecalciferol (VITAMIN D3) 1000 UNITS CAPS Take 1 capsule by mouth at bedtime.     Marland Kitchen FERREX 150 150 MG  capsule TAKE ONE CAPSULE BY MOUTH ONE TIME DAILY (Patient taking differently: TAKE ONE CAPSULE BY MOUTH every evening) 90 capsule 1  . hydrochlorothiazide (HYDRODIURIL) 25 MG tablet TAKE ONE TABLET BY MOUTH ONE  TIME DAILY 90 tablet 1  . levothyroxine (LEVOTHROID) 25 MCG tablet Take 0.5 pill (12.5 mcg) every AM with levothyroxine 150 mcg to total 162.65mcg q day. 45 tablet 6  . levothyroxine (SYNTHROID, LEVOTHROID) 150 MCG tablet Take 1 tablet (150 mcg total) by mouth daily before breakfast. 90 tablet 3  . metoprolol tartrate (LOPRESSOR) 25 MG tablet Take 0.5 tablets (12.5 mg total) by mouth every 12 (twelve) hours. 90 tablet 3  . olmesartan (BENICAR) 40 MG tablet Take 1 tablet (40 mg total) by mouth at bedtime. 90 tablet 3  . rivaroxaban (XARELTO) 20 MG TABS tablet Take 1 tablet (20 mg total) by mouth daily with supper. 90 tablet 3  . rosuvastatin (CRESTOR) 5 MG tablet Take 1 tablet (5 mg total) by mouth daily. (Patient taking differently: Take 5 mg by mouth at bedtime. ) 90 tablet 3  . LORazepam (ATIVAN) 0.5 MG tablet Take 1/2 tab daily PRN (Patient not taking: Reported on 11/10/2015) 15 tablet 0   No current facility-administered medications for this visit.    Allergies:   Review of patient's allergies indicates no known allergies.   Social History:  The patient  reports that he quit smoking about 22 years ago. His smoking use included Cigarettes. He has never used smokeless tobacco. He reports that he does not drink alcohol or use illicit drugs.  Family History:  The patient's  family history includes COPD in his father; Cancer in his brother; Crohn's disease in his sister; Diabetes in his mother; Heart disease in his sister; Heart failure in his mother; Hypertension in his mother and sister; Lung cancer in his father; Thyroid disease in his sister; Tuberous sclerosis in his father.    ROS:  Please see the history of present illness.   All other systems are reviewed and negative.    PHYSICAL  EXAM: VS:  BP 118/80 mmHg  Pulse 62  Ht 5\' 11"  (1.803 m)  Wt 239 lb (108.41 kg)  BMI 33.35 kg/m2 , BMI Body mass index is 33.35 kg/(m^2). GEN: Well nourished, well developed, in no acute distress HEENT: normal Neck: no JVD, carotid bruits, or masses Cardiac: RRR; no murmurs, rubs, or gallops,no edema  Respiratory:  clear to auscultation bilaterally, normal work of breathing GI: soft, nontender, nondistended, + BS MS: no deformity or atrophy Skin: warm and dry  Neuro:  Strength and sensation are intact Psych: euthymic mood, full affect  EKG:  EKG 11/07/15 is reviewed and reveals sinus rhythm   Recent Labs: 11/01/2015: BUN 18; Creatinine, Ser 1.16; Hemoglobin 16.2; Platelets 173; Potassium 4.5; Sodium 139; TSH 0.909    Lipid Panel     Component Value Date/Time   CHOL 133 12/24/2014 1710   TRIG 139 12/24/2014 1710   HDL 39* 12/24/2014 1710     Wt Readings from Last 3 Encounters:  11/10/15 239 lb (108.41 kg)  11/10/15 238 lb (107.956 kg)  11/05/15 239 lb 7 oz (108.608 kg)      Other studies Reviewed: Additional studies/ records that were reviewed today include: Dr Lysbeth Penner notes, AF clinic notes, recent echo    ASSESSMENT AND PLAN:  1.  Paroxysmal atrial fibrillation The patient has symptomatic recurrent atrial fibrillation.  He has failed medical therapy with metoprolol and flecainide. Therapeutic strategies for afib including medicine and ablation were discussed in detail with the patient today. Risk, benefits, and alternatives to EP study and radiofrequency ablation for afib were also discussed in detail today. These risks include but are not limited to stroke, bleeding, vascular damage, tamponade, perforation, damage to the esophagus, lungs, and other structures, pulmonary vein stenosis, worsening renal function, and death. The patient understands these risk and wishes to proceed.  We will therefore proceed with catheter ablation once the patient has been adequately  anticoagulated.  Will plan cardiac CT prior to ablation.  TEE is not planned.  Continue xarelto.  2. HTN Stable No change required today  3. Nonischemic CM Likely tachycardia mediated I am optimistic that EF will improve with sinus rhythm  Current medicines are reviewed at length with the patient today.   The patient does not have concerns regarding his medicines.  The following changes were made today:  none  Labs/ tests ordered today include:  Orders Placed This Encounter  Procedures  . CT Heart Morp W/Cta Cor W/Score W/Ca W/Cm &/Or Wo/Cm  . Basic metabolic panel  . CBC w/Diff     Signed, [name], MD  11/12/2015 1:42 PM     [address] [place] [id number] [phone] (office) [phone] (fax)

## 2015-11-15 LAB — PSA: PSA: 0.8

## 2015-11-18 LAB — METANEPHRINES, URINE, 24 HOUR
METANEPH TOTAL UR: 392 ug/(24.h) (ref 224–832)
METANEPHRINES UR: 181 ug/(24.h) (ref 90–315)
Normetanephrine, 24H Ur: 211 mcg/24 h (ref 122–676)

## 2015-11-18 NOTE — Progress Notes (Addendum)
Pt aware of all notes - he will wait on any changes at this time  - He is scheduled for heart ablation for the 17th of march.

## 2015-11-19 LAB — 5 HIAA, QUANTITATIVE, URINE, 24 HOUR: 5-HIAA, 24 Hr Urine: 4.6 mg/24 h (ref <=6.0)

## 2015-11-26 ENCOUNTER — Encounter: Payer: Self-pay | Admitting: Internal Medicine

## 2015-11-28 ENCOUNTER — Telehealth: Payer: Self-pay | Admitting: Nurse Practitioner

## 2015-11-28 NOTE — Telephone Encounter (Signed)
   Pt called this AM to report that he has been in AF since 2/18 @ about 2 AM.  While in AF, he notes irregular palpitations and some fatigue but denies c/p, dyspnea, or presyncope/syncope.  His HR has been running 80's to low 100's.  His BP has been in the 120's.  He is on lopressor 12.5 BID.  I rec that he increase his bb to 25 mg BID.  As he is relatively asymptomatic and already on anticoagulation, he does not need to come into the ED for evaluation.  If he is still in AF tomorrow, I've rec that he call back as he may require DCCV prior to RFCA - currently scheduled for 3/17 (pt says that was first available).  Caller verbalized understanding and was grateful for the call back.  Murray Hodgkins, NP 11/28/2015, 8:11 AM

## 2015-11-29 ENCOUNTER — Telehealth: Payer: Self-pay | Admitting: Internal Medicine

## 2015-11-29 NOTE — Telephone Encounter (Signed)
Pt have been in atrial fib since 2:00 Saturday morning. She talked to the doctor on call,was told if he still had it on Monday to call the office.

## 2015-11-29 NOTE — Telephone Encounter (Signed)
Returned call to patient. He reports he has been in AF since 0200 Saturday AM. He called and spoke with Ignacia Bayley, NP on 2/19 who recommended an increase in lopressor from 12.5mg  BID to 25mg  BID which patient took yesterday. This AM he only took lopressor 12.5mg . He reports he feels OK - just c/o fatigue - denies chest pain, shortness of breath.   HR running in 90s BP running 110/70s-80s  Patient scheduled for AF ablation in March with Dr. Rayann Heman Per Angelica Ran, NP note - DCCV was mentioned - patient has never had this before.   Patient symptoms did not seem to warrant acute add on to see provider and patient agreed, but he would like advice on what to do as he states he has never been in AF this long.   Message routed to Dr. Debara Pickett

## 2015-11-29 NOTE — Telephone Encounter (Signed)
LM on home answering machine [phone]) with MD advice.

## 2015-11-29 NOTE — Telephone Encounter (Signed)
Spoke with DOD (Dr. [place]ique) regarding patient. He agreed there isn't much else to do in the way of medication changes other than to make sure his HR is controlled, which it appears to be. If she becomes symptomatic with CP, SOB, a cardioversion may be warranted. Patient unable to take flecainide d/t global hypokinesis on echo and EF 45% (d/c'ed 1/27 by Debara Pickett, MD). Of note, in regard to B-Blocker, his resting HR when not in AF tends to be lower  Informed patient of MD advice and he voiced understanding. He states he would consider a cardioversion if he remains in AF for a few more days. He still only reports fatigue when trying to do things, at rest he is asymptomatic. Informed him I have also sent the message to Dr. Debara Pickett for advice and will contact him with any different advice per MD.

## 2015-11-29 NOTE — Telephone Encounter (Signed)
Please reassure him .. I agree with the recommendations of increased metoprolol. If he remains out of rhythm, we could investigate cardioversion at some point prior to his ablation.  Dr. Lemmie Evens

## 2015-11-29 NOTE — Telephone Encounter (Signed)
Follow Up  Pt wife called states that she is waiting on the call from Dr. Debara Pickett. She is asking if there is anyone else she can speak on because she doesn't want to wait to much longer.

## 2015-11-30 ENCOUNTER — Telehealth: Payer: Self-pay | Admitting: Internal Medicine

## 2015-11-30 NOTE — Telephone Encounter (Signed)
Spoke with patient and he says his HR's are controlled and he does not want to have DCCV at this time.  He says he spoke with Dr Debara Pickett who suggested extra Metoprolol if his HR was >100.  This is what he is going to do and will keep the ablation scheduled for 12/24/15

## 2015-11-30 NOTE — Telephone Encounter (Signed)
New message      Pt is still in AFIB.  He want to proceed with a cardioversion instead of waiting for the ablation.  Please call

## 2015-11-30 NOTE — Telephone Encounter (Signed)
Follow up      Now pt thinks he does not want the cardioversion and just keep the ablation appt.  He is want to talk to the nurse to get his options and to see if it is safe to wait.

## 2015-12-06 ENCOUNTER — Ambulatory Visit: Payer: BLUE CROSS/BLUE SHIELD | Admitting: Internal Medicine

## 2015-12-08 ENCOUNTER — Other Ambulatory Visit (INDEPENDENT_AMBULATORY_CARE_PROVIDER_SITE_OTHER): Payer: BLUE CROSS/BLUE SHIELD | Admitting: *Deleted

## 2015-12-08 ENCOUNTER — Encounter: Payer: Self-pay | Admitting: Internal Medicine

## 2015-12-08 ENCOUNTER — Ambulatory Visit (INDEPENDENT_AMBULATORY_CARE_PROVIDER_SITE_OTHER): Payer: BLUE CROSS/BLUE SHIELD | Admitting: Internal Medicine

## 2015-12-08 VITALS — BP 114/60 | HR 73 | Ht 71.0 in | Wt 238.9 lb

## 2015-12-08 DIAGNOSIS — I10 Essential (primary) hypertension: Secondary | ICD-10-CM | POA: Diagnosis not present

## 2015-12-08 DIAGNOSIS — E785 Hyperlipidemia, unspecified: Secondary | ICD-10-CM | POA: Diagnosis not present

## 2015-12-08 DIAGNOSIS — I480 Paroxysmal atrial fibrillation: Secondary | ICD-10-CM | POA: Diagnosis not present

## 2015-12-08 DIAGNOSIS — I428 Other cardiomyopathies: Secondary | ICD-10-CM

## 2015-12-08 DIAGNOSIS — I4891 Unspecified atrial fibrillation: Secondary | ICD-10-CM

## 2015-12-08 DIAGNOSIS — I429 Cardiomyopathy, unspecified: Secondary | ICD-10-CM | POA: Diagnosis not present

## 2015-12-08 LAB — CBC WITH DIFFERENTIAL/PLATELET
Basophils Absolute: 0 10*3/uL (ref 0.0–0.1)
Basophils Relative: 0 % (ref 0–1)
EOS ABS: 0.1 10*3/uL (ref 0.0–0.7)
Eosinophils Relative: 1 % (ref 0–5)
HEMATOCRIT: 44.9 % (ref 39.0–52.0)
HEMOGLOBIN: 15.9 g/dL (ref 13.0–17.0)
LYMPHS ABS: 1.5 10*3/uL (ref 0.7–4.0)
LYMPHS PCT: 18 % (ref 12–46)
MCH: 30.5 pg (ref 26.0–34.0)
MCHC: 35.4 g/dL (ref 30.0–36.0)
MCV: 86 fL (ref 78.0–100.0)
MONOS PCT: 8 % (ref 3–12)
MPV: 8 fL — ABNORMAL LOW (ref 8.6–12.4)
Monocytes Absolute: 0.7 10*3/uL (ref 0.1–1.0)
NEUTROS ABS: 6.3 10*3/uL (ref 1.7–7.7)
NEUTROS PCT: 73 % (ref 43–77)
Platelets: 193 10*3/uL (ref 150–400)
RBC: 5.22 MIL/uL (ref 4.22–5.81)
RDW: 13.4 % (ref 11.5–15.5)
WBC: 8.6 10*3/uL (ref 4.0–10.5)

## 2015-12-08 LAB — BASIC METABOLIC PANEL
BUN: 17 mg/dL (ref 7–25)
CO2: 26 mmol/L (ref 20–31)
CREATININE: 1.02 mg/dL (ref 0.70–1.25)
Calcium: 9.3 mg/dL (ref 8.6–10.3)
Chloride: 102 mmol/L (ref 98–110)
Glucose, Bld: 105 mg/dL — ABNORMAL HIGH (ref 65–99)
Potassium: 4 mmol/L (ref 3.5–5.3)
Sodium: 138 mmol/L (ref 135–146)

## 2015-12-08 MED ORDER — METOPROLOL TARTRATE 25 MG PO TABS: 12.5000 mg | ORAL_TABLET | Freq: Two times a day (BID) | ORAL | Status: DC

## 2015-12-08 NOTE — Addendum Note (Signed)
Addended by: Eulis Foster on: 12/08/2015 09:31 AM   Modules accepted: Orders

## 2015-12-08 NOTE — Progress Notes (Signed)
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 They did perform an EKG which was normal. There was a suggestion to see a cardiologist. Troy Acosta has history of hypertension and dyslipidemia. There is a family history of heart failure in his mother. He's had no anginal symptoms or worsening shortness of breath or fatigue since this episode. I wonder if he possibly had an episode of atrial fibrillation. He denied any palpitations or fluttering but did feel like a pressure across his chest. He reports good sleep at night and says that his wife does not report he snores or stops breathing.  Troy Acosta returns today for follow-up. I recently placed him on a monitor and this did confirm my suspicion that he has proximal atrial fibrillation. In fact he had an episode at rest where his heart rate went up to almost 180. This eventually subsided without therapy and he did call EMS to capture the A. fib but he declined going to the hospital. He did call the office and we advised she start on anticoagulation. Today's back in sinus rhythm with a controlled rate.  I saw Troy Acosta back in the office today for follow-up. His last office visit I started him on diltiazem for rate control. He reports he's had 2 other episodes of A. fib both of which cause significant fatigue although his heart rate was not elevated above 100 he was very symptomatic. He said the next day he felt very tired and is generally very uncomfortable with his A. fib. Despite good rate control, it seems that he is in need of rhythm control. He has had some intermittent episodes of chest discomfort and we did do an exercise treadmill  stress test a few months ago which was negative for ischemia.  Troy Acosta returns today for follow-up. Overall he is very pleased without is doing on flecainide. He's only had one short episode of A. fib but he said it resolved rather quickly. He's been able to go hunting and do other activities and is been not bothered by palpitations. Blood pressure heart rate seem to be well-controlled. His EKG in the office today shows sinus bradycardia at 54 with a QTC of 383 ms.  Troy Acosta returns today for hospital follow-up. Unfortunately he presented to the ER with palpitations and symptomatic atrial flutter which ultimately resolved. I previously started him on flecainide 50 mg twice daily after he had exercise treadmill stress testing which indicated no evidence for ischemia. He had good exercise tolerance and no signs or symptoms of heart failure. He was having breakthrough atrial fibrillation and therefore was seen in the A. Fib clinic by Roderic Palau, NP. She increased his flecainide to 75 mg twice a day and ordered an echocardiogram for the purposes of planning possible ablation. Subsequently he was admitted with chest pain and ruled out for MI. He underwent a nuclear stress test which was negative for ischemia but did show an EF of 48%. He also underwent a 2-D echocardiogram which showed global hypokinesis and EF of 45-50%. It was mild LVH. Based on those findings I recommended that he stop flecainide immediately, as it's contraindicated in  patients with structural heart disease. I suspect he may have developed his cardiomyopathy secondary to recurrent atrial fibrillation. He is quite concerned about options for keeping him in sinus rhythm. Since he is symptomatic, antiarrhythmic therapy or ablation seem to be the only options at this point. He has limited choices including possibly Multaq, Amiodarone or Tikosyn. Another issue is the fact that he tends to be bradycardic at rest. EKG shows sinus rhythm today.  Mr.  Acosta returns today for follow-up. He is pleased to say that he's been in sinus rhythm now for about 8 days. He is clearly highly symptomatic with his A. fib. In general his heart rate is in the low 50s on low-dose metoprolollittle room to increase that. He has taken extra metoprolol vertically when his heart races with some success. As previously mentioned he failed flecainide therapy. I referred him to Dr. Rayann Heman for possible catheter ablation and he felt that this was an ideal treatment. Troy Acosta is therefore scheduled for catheter ablation in 2 weeks. He'll undergo a CT scan with coronary angiography prior to that. Follow-up will be in the A. fib clinic and then with Dr. Rayann Heman.  PMHx:  Past Medical History  Diagnosis Date  . Diaphragmatic hernia without mention of obstruction or gangrene   . Unspecified hypothyroidism   . Malaise and fatigue   . Arteritis, unspecified ([place])   . Iron deficiency anemia, unspecified   . Obesity   . Hyperplasia of prostate   . Essential hypertension, benign   . Other and unspecified hyperlipidemia   . Paroxysmal atrial fibrillation ([facility])     No past surgical history on file.  FAMHx:  Family History  Problem Relation Age of Onset  . Heart failure Mother   . Hypertension Mother   . Diabetes Mother   . Lung cancer Father   . COPD Father   . Tuberous sclerosis Father   . Heart disease Sister   . Cancer Brother   . Hypertension Sister   . Thyroid disease Sister   . Crohn's disease Sister     SOCHx:   reports that he quit smoking about 23 years ago. His smoking use included Cigarettes. He has never used smokeless tobacco. He reports that he does not drink alcohol or use illicit drugs.  ALLERGIES:  No Known Allergies  ROS: A comprehensive review of systems was negative except for: Cardiovascular: positive for palpitations  HOME MEDS: Current Outpatient Prescriptions  Medication Sig Dispense Refill  . Cholecalciferol (VITAMIN D3) 1000 UNITS  CAPS Take 1 capsule by mouth at bedtime.     Marland Kitchen FERREX 150 150 MG capsule TAKE ONE CAPSULE BY MOUTH ONE TIME DAILY (Patient taking differently: TAKE ONE CAPSULE BY MOUTH every evening) 90 capsule 1  . hydrochlorothiazide (HYDRODIURIL) 25 MG tablet TAKE ONE TABLET BY MOUTH ONE  TIME DAILY 90 tablet 1  . levothyroxine (LEVOTHROID) 25 MCG tablet Take 0.5 pill (12.5 mcg) every AM with levothyroxine 150 mcg to total 162.19mg q day. 45 tablet 6  . levothyroxine (SYNTHROID, LEVOTHROID) 150 MCG tablet Take 1 tablet (150 mcg total) by mouth daily before breakfast. 90 tablet 3  . LORazepam (ATIVAN) 0.5 MG tablet Take 1/2 tab daily PRN 15 tablet 0  . metoprolol tartrate (LOPRESSOR) 25 MG tablet Take 0.5 tablets (12.5 mg total) by mouth every 12 (twelve) hours. May take extra half tablet as needed if HR greater than 100. 5 tablet 0  . olmesartan (BENICAR) 40 MG tablet Take 20 mg by mouth  daily.    . rivaroxaban (XARELTO) 20 MG TABS tablet Take 1 tablet (20 mg total) by mouth daily with supper. 90 tablet 3  . rosuvastatin (CRESTOR) 5 MG tablet Take 1 tablet (5 mg total) by mouth daily. (Patient taking differently: Take 5 mg by mouth at bedtime. ) 90 tablet 3   No current facility-administered medications for this visit.    LABS/IMAGING: Results for orders placed or performed in visit on 12/08/15 (from the past 48 hour(s))  Basic metabolic panel     Status: None (Preliminary result)   Collection Time: 12/08/15  9:31 AM  Result Value Ref Range   Sodium  135 - 146 mmol/L   Potassium  3.5 - 5.3 mmol/L   Chloride  98 - 110 mmol/L   CO2  20 - 31 mmol/L   Glucose, Bld  65 - 99 mg/dL   BUN  7 - 25 mg/dL   Creat  0.70 - 1.25 mg/dL   Calcium  8.6 - 10.3 mg/dL  CBC w/Diff     Status: Abnormal   Collection Time: 12/08/15  9:31 AM  Result Value Ref Range   WBC 8.6 4.0 - 10.5 K/uL   RBC 5.22 4.22 - 5.81 MIL/uL   Hemoglobin 15.9 13.0 - 17.0 g/dL   HCT 44.9 39.0 - 52.0 %   MCV 86.0 78.0 - 100.0 fL   MCH 30.5  26.0 - 34.0 pg   MCHC 35.4 30.0 - 36.0 g/dL   RDW 13.4 11.5 - 15.5 %   Platelets 193 150 - 400 K/uL   MPV 8.0 (L) 8.6 - 12.4 fL   Neutrophils Relative % 73 43 - 77 %   Neutro Abs 6.3 1.7 - 7.7 K/uL   Lymphocytes Relative 18 12 - 46 %   Lymphs Abs 1.5 0.7 - 4.0 K/uL   Monocytes Relative 8 3 - 12 %   Monocytes Absolute 0.7 0.1 - 1.0 K/uL   Eosinophils Relative 1 0 - 5 %   Eosinophils Absolute 0.1 0.0 - 0.7 K/uL   Basophils Relative 0 0 - 1 %   Basophils Absolute 0.0 0.0 - 0.1 K/uL   Smear Review Criteria for review not met    No results found.  WEIGHTS: Wt Readings from Last 3 Encounters:  12/08/15 238 lb 14.4 oz (108.364 kg)  11/10/15 239 lb (108.41 kg)  11/10/15 238 lb (107.956 kg)    VITALS: BP 114/60 mmHg  Pulse 73  Ht '5\' 11"'  (1.803 m)  Wt 238 lb 14.4 oz (108.364 kg)  BMI 33.33 kg/m2  EXAM: Deferred  EKG: Normal sinus rhythm at 73   ASSESSMENT: 1. Paroxysmal atrial fibrillation - CHADSVASC score of 1 (on Xarelto) 2. Mild non-ischemic cardiomyopathy EF 45-50%, mild LVH 3. Fatigue, shortness of breath and chest pain - low risk stress myoview 4. Hypertension 5. Dyslipidemia  PLAN: 1.   Mr. Churchill has been maintaining sinus rhythm now for about 8 days. He is anxiously looking forward to his ablation. He could still use an extra half dose of metoprolol as needed for tachycardia palpitations. I stressed compliance with his Xarelto up until his ablation procedure. Currently he feels well. We will plan follow-up in 6 months with me and he will have close follow-up in the A. fib clinic and with Dr. Rayann Heman in the interim.   Pixie Casino, MD, [facility] Attending Cardiologist [facility] 12/08/2015, 4:09 PM

## 2015-12-08 NOTE — Patient Instructions (Signed)
Your physician wants you to follow-up in: 6 months with Dr. Hilty. You will receive a reminder letter in the mail two months in advance. If you don't receive a letter, please call our office to schedule the follow-up appointment.    

## 2015-12-10 ENCOUNTER — Encounter: Payer: Self-pay | Admitting: *Deleted

## 2015-12-10 LAB — HEPATITIS C ANTIBODY: Hep C Virus Ab: NONREACTIVE

## 2015-12-17 ENCOUNTER — Encounter (HOSPITAL_COMMUNITY): Payer: Self-pay

## 2015-12-17 ENCOUNTER — Ambulatory Visit (HOSPITAL_COMMUNITY)
Admission: RE | Admit: 2015-12-17 | Discharge: 2015-12-17 | Disposition: A | Discharge status: Home / Self Care | Payer: BLUE CROSS/BLUE SHIELD | Source: Ambulatory Visit | Attending: Internal Medicine | Admitting: Internal Medicine

## 2015-12-17 DIAGNOSIS — I4891 Unspecified atrial fibrillation: Secondary | ICD-10-CM | POA: Diagnosis present

## 2015-12-17 DIAGNOSIS — I481 Persistent atrial fibrillation: Secondary | ICD-10-CM | POA: Diagnosis not present

## 2015-12-17 DIAGNOSIS — I4819 Other persistent atrial fibrillation: Secondary | ICD-10-CM

## 2015-12-17 MED ORDER — IOHEXOL 350 MG/ML SOLN
80.0000 mL | Freq: Once | INTRAVENOUS | Status: AC | PRN
Start: 2015-12-17 — End: 2015-12-17
  Administered 2015-12-17: 80 mL via INTRAVENOUS

## 2015-12-21 ENCOUNTER — Telehealth: Payer: Self-pay | Admitting: Internal Medicine

## 2015-12-21 NOTE — Telephone Encounter (Signed)
Patient found his instruction sheet.

## 2015-12-21 NOTE — Telephone Encounter (Signed)
New message      Pt is scheduled for an ablation Friday.  Wife says he was not given any instructions regarding medications, eating, etc.  Please call

## 2015-12-23 NOTE — Anesthesia Preprocedure Evaluation (Addendum)
Anesthesia Evaluation  Patient identified by MRN, date of birth, ID band Patient awake    Reviewed: Allergy & Precautions, NPO status , Patient's Chart, lab work & pertinent test results, reviewed documented beta blocker date and time   Airway Mallampati: II  TM Distance: >3 FB Neck ROM: Full    Dental  (+) Dental Advisory Given   Pulmonary former smoker,    breath sounds clear to auscultation       Cardiovascular hypertension, Pt. on medications and Pt. on home beta blockers  Rhythm:Regular Rate:Normal     Neuro/Psych  Neuromuscular disease negative psych ROS   GI/Hepatic negative GI ROS, Neg liver ROS,   Endo/Other  Hypothyroidism   Renal/GU negative Renal ROS  negative genitourinary   Musculoskeletal negative musculoskeletal ROS (+)   Abdominal   Peds negative pediatric ROS (+)  Hematology   Anesthesia Other Findings   Reproductive/Obstetrics negative OB ROS                            Lab Results  Component Value Date   WBC 8.6 12/08/2015   HGB 15.9 12/08/2015   HCT 44.9 12/08/2015   MCV 86.0 12/08/2015   PLT 193 12/08/2015   Lab Results  Component Value Date   CREATININE 1.02 12/08/2015   BUN 17 12/08/2015   NA 138 12/08/2015   K 4.0 12/08/2015   CL 102 12/08/2015   CO2 26 12/08/2015   No results found for: INR, PROTIME  10/2015 Echo - Left ventricle: The cavity size was normal. Wall thickness was increased in a pattern of mild LVH. Systolic function was mildly reduced. The estimated ejection fraction was in the range of 45% to 50%. Diffuse hypokinesis. Doppler parameters are consistent with abnormal left ventricular relaxation (grade 1 diastolic dysfunction). - Aortic valve: There was no stenosis. - Mitral valve: There was no significant regurgitation. - Left atrium: The atrium was mildly dilated. - Right ventricle: Poorly visualized. The cavity size was  normal. Systolic function was mildly reduced. - Right atrium: The atrium was mildly dilated. - Pulmonary arteries: No TR doppler jet so unable to estimate PA systolic pressure. - Systemic veins: IVC was not visualized.     Anesthesia Physical Anesthesia Plan  ASA: III  Anesthesia Plan: MAC   Post-op Pain Management:    Induction: Intravenous  Airway Management Planned: Natural Airway and Simple Face Mask  Additional Equipment:   Intra-op Plan:   Post-operative Plan:   Informed Consent: I have reviewed the patients History and Physical, chart, labs and discussed the procedure including the risks, benefits and alternatives for the proposed anesthesia with the patient or authorized representative who has indicated his/her understanding and acceptance.   Dental advisory given  Plan Discussed with: CRNA  Anesthesia Plan Comments:         Anesthesia Quick Evaluation

## 2015-12-24 ENCOUNTER — Encounter (HOSPITAL_COMMUNITY): Payer: Self-pay | Admitting: Certified Registered"

## 2015-12-24 ENCOUNTER — Ambulatory Visit (HOSPITAL_COMMUNITY): Payer: BLUE CROSS/BLUE SHIELD | Admitting: Certified Registered"

## 2015-12-24 ENCOUNTER — Encounter (HOSPITAL_COMMUNITY)
Admission: RE | Disposition: A | Discharge status: Home / Self Care | Payer: Self-pay | Source: Ambulatory Visit | Attending: Internal Medicine

## 2015-12-24 ENCOUNTER — Ambulatory Visit (HOSPITAL_COMMUNITY)
Admission: RE | Admit: 2015-12-24 | Discharge: 2015-12-25 | Disposition: A | Discharge status: Home / Self Care | Payer: BLUE CROSS/BLUE SHIELD | Source: Ambulatory Visit | Attending: Internal Medicine | Admitting: Internal Medicine

## 2015-12-24 DIAGNOSIS — I10 Essential (primary) hypertension: Secondary | ICD-10-CM | POA: Diagnosis not present

## 2015-12-24 DIAGNOSIS — E669 Obesity, unspecified: Secondary | ICD-10-CM | POA: Diagnosis not present

## 2015-12-24 DIAGNOSIS — E785 Hyperlipidemia, unspecified: Secondary | ICD-10-CM | POA: Diagnosis not present

## 2015-12-24 DIAGNOSIS — Z6834 Body mass index (BMI) 34.0-34.9, adult: Secondary | ICD-10-CM | POA: Diagnosis not present

## 2015-12-24 DIAGNOSIS — Z7901 Long term (current) use of anticoagulants: Secondary | ICD-10-CM | POA: Diagnosis not present

## 2015-12-24 DIAGNOSIS — I480 Paroxysmal atrial fibrillation: Secondary | ICD-10-CM | POA: Diagnosis not present

## 2015-12-24 DIAGNOSIS — N400 Enlarged prostate without lower urinary tract symptoms: Secondary | ICD-10-CM | POA: Diagnosis not present

## 2015-12-24 DIAGNOSIS — Z87891 Personal history of nicotine dependence: Secondary | ICD-10-CM | POA: Insufficient documentation

## 2015-12-24 DIAGNOSIS — I429 Cardiomyopathy, unspecified: Secondary | ICD-10-CM | POA: Insufficient documentation

## 2015-12-24 DIAGNOSIS — E039 Hypothyroidism, unspecified: Secondary | ICD-10-CM | POA: Diagnosis not present

## 2015-12-24 DIAGNOSIS — I4891 Unspecified atrial fibrillation: Secondary | ICD-10-CM | POA: Diagnosis present

## 2015-12-24 HISTORY — PX: ELECTROPHYSIOLOGIC STUDY: SHX172A

## 2015-12-24 LAB — POCT ACTIVATED CLOTTING TIME
ACTIVATED CLOTTING TIME: 291 s
ACTIVATED CLOTTING TIME: 291 s
ACTIVATED CLOTTING TIME: 307 s
Activated Clotting Time: 162 seconds

## 2015-12-24 SURGERY — ATRIAL FIBRILLATION ABLATION
Anesthesia: Monitor Anesthesia Care

## 2015-12-24 MED ORDER — FENTANYL CITRATE (PF) 250 MCG/5ML IJ SOLN
INTRAMUSCULAR | Status: DC | PRN
  Administered 2015-12-24 (×2): 25 ug via INTRAVENOUS
  Administered 2015-12-24: 50 ug via INTRAVENOUS
  Administered 2015-12-24: 25 ug via INTRAVENOUS

## 2015-12-24 MED ORDER — LORAZEPAM 0.5 MG PO TABS: 0.5000 mg | ORAL_TABLET | Freq: Four times a day (QID) | ORAL | Status: DC | PRN

## 2015-12-24 MED ORDER — DOBUTAMINE IN D5W 4-5 MG/ML-% IV SOLN
INTRAVENOUS | Status: AC
  Filled 2015-12-24: qty 250

## 2015-12-24 MED ORDER — SODIUM CHLORIDE 0.9% FLUSH: 3.0000 mL | Freq: Two times a day (BID) | INTRAVENOUS | Status: DC

## 2015-12-24 MED ORDER — PROPOFOL 10 MG/ML IV BOLUS
INTRAVENOUS | Status: DC | PRN
  Administered 2015-12-24: 20 mg via INTRAVENOUS

## 2015-12-24 MED ORDER — MEPERIDINE HCL 25 MG/ML IJ SOLN: 6.2500 mg | INTRAMUSCULAR | Status: DC | PRN

## 2015-12-24 MED ORDER — HEPARIN SODIUM (PORCINE) 1000 UNIT/ML IJ SOLN
INTRAMUSCULAR | Status: AC
  Filled 2015-12-24: qty 1

## 2015-12-24 MED ORDER — LACTATED RINGERS IV SOLN: INTRAVENOUS | Status: DC

## 2015-12-24 MED ORDER — ONDANSETRON HCL 4 MG/2ML IJ SOLN: 4.0000 mg | Freq: Four times a day (QID) | INTRAMUSCULAR | Status: DC | PRN

## 2015-12-24 MED ORDER — MIDAZOLAM HCL 5 MG/5ML IJ SOLN
INTRAMUSCULAR | Status: DC | PRN
  Administered 2015-12-24 (×2): 1 mg via INTRAVENOUS

## 2015-12-24 MED ORDER — IOPAMIDOL (ISOVUE-370) INJECTION 76%
INTRAVENOUS | Status: AC
  Filled 2015-12-24: qty 50

## 2015-12-24 MED ORDER — BUPIVACAINE HCL (PF) 0.25 % IJ SOLN
INTRAMUSCULAR | Status: AC
  Filled 2015-12-24: qty 30

## 2015-12-24 MED ORDER — SODIUM CHLORIDE 0.9% FLUSH: 3.0000 mL | INTRAVENOUS | Status: DC | PRN

## 2015-12-24 MED ORDER — LEVOTHYROXINE SODIUM 75 MCG PO TABS
150.0000 ug | ORAL_TABLET | Freq: Every day | ORAL | Status: DC
  Administered 2015-12-25: 150 ug via ORAL
  Filled 2015-12-24: qty 2

## 2015-12-24 MED ORDER — SODIUM CHLORIDE 0.9 % IV SOLN: 250.0000 mL | INTRAVENOUS | Status: DC | PRN

## 2015-12-24 MED ORDER — HYDROCODONE-ACETAMINOPHEN 5-325 MG PO TABS: 1.0000 | ORAL_TABLET | ORAL | Status: DC | PRN

## 2015-12-24 MED ORDER — SODIUM CHLORIDE 0.9 % IV SOLN
INTRAVENOUS | Status: DC
  Administered 2015-12-24 (×2): via INTRAVENOUS

## 2015-12-24 MED ORDER — PROMETHAZINE HCL 25 MG/ML IJ SOLN: 6.2500 mg | INTRAMUSCULAR | Status: DC | PRN

## 2015-12-24 MED ORDER — HEPARIN SODIUM (PORCINE) 1000 UNIT/ML IJ SOLN
INTRAMUSCULAR | Status: DC | PRN
  Administered 2015-12-24: 12000 [IU] via INTRAVENOUS
  Administered 2015-12-24: 1000 [IU] via INTRAVENOUS

## 2015-12-24 MED ORDER — PROPOFOL 500 MG/50ML IV EMUL
INTRAVENOUS | Status: DC | PRN
  Administered 2015-12-24: 75 ug/kg/min via INTRAVENOUS

## 2015-12-24 MED ORDER — PHENYLEPHRINE HCL 10 MG/ML IJ SOLN
10.0000 mg | INTRAMUSCULAR | Status: DC | PRN
  Administered 2015-12-24: 15 ug/min via INTRAVENOUS

## 2015-12-24 MED ORDER — OFF THE BEAT BOOK
Freq: Once | Status: AC
  Administered 2015-12-24: 23:00:00
  Filled 2015-12-24: qty 1

## 2015-12-24 MED ORDER — RIVAROXABAN 20 MG PO TABS
20.0000 mg | ORAL_TABLET | Freq: Every day | ORAL | Status: DC
  Administered 2015-12-24: 20 mg via ORAL
  Filled 2015-12-24: qty 1

## 2015-12-24 MED ORDER — BUPIVACAINE HCL (PF) 0.25 % IJ SOLN
INTRAMUSCULAR | Status: DC | PRN
  Administered 2015-12-24: 21 mL

## 2015-12-24 MED ORDER — ACETAMINOPHEN 325 MG PO TABS: 650.0000 mg | ORAL_TABLET | ORAL | Status: DC | PRN

## 2015-12-24 MED ORDER — IOPAMIDOL (ISOVUE-370) INJECTION 76%
INTRAVENOUS | Status: DC | PRN
  Administered 2015-12-24: 5 mL

## 2015-12-24 MED ORDER — PROTAMINE SULFATE 10 MG/ML IV SOLN
INTRAVENOUS | Status: DC | PRN
  Administered 2015-12-24: 30 mg via INTRAVENOUS

## 2015-12-24 MED ORDER — HEPARIN SODIUM (PORCINE) 1000 UNIT/ML IJ SOLN
INTRAMUSCULAR | Status: DC | PRN
  Administered 2015-12-24: 2000 [IU] via INTRAVENOUS
  Administered 2015-12-24: 3000 [IU] via INTRAVENOUS

## 2015-12-24 MED ORDER — FENTANYL CITRATE (PF) 100 MCG/2ML IJ SOLN: 25.0000 ug | INTRAMUSCULAR | Status: DC | PRN

## 2015-12-24 SURGICAL SUPPLY — 19 items
BAG SNAP BAND KOVER 36X36 (MISCELLANEOUS) ×2 IMPLANT
BLANKET WARM UNDERBOD FULL ACC (MISCELLANEOUS) ×2 IMPLANT
CATH NAVISTAR SMARTTOUCH DF (ABLATOR) ×2 IMPLANT
CATH SOUNDSTAR 3D IMAGING (CATHETERS) ×2 IMPLANT
CATH VARIABLE LASSO NAV 2515 (CATHETERS) ×1 IMPLANT
CATH WEBSTER BI DIR CS D-F CRV (CATHETERS) ×1 IMPLANT
COVER SWIFTLINK CONNECTOR (BAG) ×2 IMPLANT
NDL TRANSEP BRK 71CM 407200 (NEEDLE) IMPLANT
NEEDLE TRANSEP BRK 71CM 407200 (NEEDLE) ×2 IMPLANT
PACK EP LATEX FREE (CUSTOM PROCEDURE TRAY) ×2
PACK EP LF (CUSTOM PROCEDURE TRAY) ×1 IMPLANT
PAD DEFIB LIFELINK (PAD) ×2 IMPLANT
PATCH CARTO3 (PAD) ×1 IMPLANT
SHEATH AVANTI 11F 11CM (SHEATH) ×2 IMPLANT
SHEATH PINNACLE 7F 10CM (SHEATH) ×2 IMPLANT
SHEATH PINNACLE 9F 10CM (SHEATH) ×1 IMPLANT
SHEATH SWARTZ TS SL2 63CM 8.5F (SHEATH) ×1 IMPLANT
SHIELD RADPAD SCOOP 12X17 (MISCELLANEOUS) ×2 IMPLANT
TUBING SMART ABLATE COOLFLOW (TUBING) ×1 IMPLANT

## 2015-12-24 NOTE — Transfer of Care (Signed)
Immediate Anesthesia Transfer of Care Note  Patient: Troy Acosta  Procedure(s) Performed: Procedure(s): Atrial Fibrillation Ablation (N/A)  Patient Location: Cath Lab  Anesthesia Type:MAC  Level of Consciousness: awake, alert  and oriented  Airway & Oxygen Therapy: Patient Spontanous Breathing and Patient connected to face mask oxygen  Post-op Assessment: Report given to RN, Post -op Vital signs reviewed and stable and Patient moving all extremities X 4  Post vital signs: Reviewed and stable  Last Vitals:  Filed Vitals:   12/24/15 0616  BP: 127/85  Pulse: 72  Temp: 36.6 C  Resp: 16    Complications: No apparent anesthesia complications

## 2015-12-24 NOTE — H&P (Signed)
PCP: Redge Gainer, MD Cardiologist: Dr Debara Pickett Primary Electrophysiologist: [name], MD   Chief Complaint  Patient presents with  . Atrial Fibrillation    History of Present Illness: Troy Acosta is a 65 y.o. male who presents today for afib ablation. He has paroxysmal atrial fibrillation. He reports initially having tachypalpitations for about 9 months. He was evaluated and had a holter placed in September. This has documented atrial fibrillation. He has been treated with flecainide. He has had recent recurrence of afib despite medical therapy with flecainide. He reports symptoms of fatigue and decreased exercise tolerance in afib. He had an echo obtained which revealed EF 45%. His flecainide has since been discontinued by Dr Debara Pickett. He is not drinking any alcohol, minimal caffeine, wife denies snoring.He cannot identify any triggers. He is compliant with xarelto. Cardiac CT reveals no cad. Today, he denies symptoms of chest pain, shortness of breath, orthopnea, PND, lower extremity edema, claudication, dizziness, presyncope, syncope, bleeding, or neurologic sequela. The patient is tolerating medications without difficulties and is otherwise without complaint today.    Past Medical History  Diagnosis Date  . Diaphragmatic hernia without mention of obstruction or gangrene   . Unspecified hypothyroidism   . Malaise and fatigue   . Arteritis, unspecified ([place])   . Iron deficiency anemia, unspecified   . Obesity   . Hyperplasia of prostate   . Essential hypertension, benign   . Other and unspecified hyperlipidemia    No past surgical history on file.   Current Outpatient Prescriptions  Medication Sig Dispense Refill  . Cholecalciferol (VITAMIN D3) 1000 UNITS CAPS Take 1 capsule by mouth at bedtime.     Marland Kitchen FERREX 150 150 MG capsule TAKE ONE CAPSULE BY MOUTH ONE TIME DAILY (Patient taking differently: TAKE  ONE CAPSULE BY MOUTH every evening) 90 capsule 1  . hydrochlorothiazide (HYDRODIURIL) 25 MG tablet TAKE ONE TABLET BY MOUTH ONE TIME DAILY 90 tablet 1  . levothyroxine (LEVOTHROID) 25 MCG tablet Take 0.5 pill (12.5 mcg) every AM with levothyroxine 150 mcg to total 162.18mcg q day. 45 tablet 6  . levothyroxine (SYNTHROID, LEVOTHROID) 150 MCG tablet Take 1 tablet (150 mcg total) by mouth daily before breakfast. 90 tablet 3  . metoprolol tartrate (LOPRESSOR) 25 MG tablet Take 0.5 tablets (12.5 mg total) by mouth every 12 (twelve) hours. 90 tablet 3  . olmesartan (BENICAR) 40 MG tablet Take 1 tablet (40 mg total) by mouth at bedtime. 90 tablet 3  . rivaroxaban (XARELTO) 20 MG TABS tablet Take 1 tablet (20 mg total) by mouth daily with supper. 90 tablet 3  . rosuvastatin (CRESTOR) 5 MG tablet Take 1 tablet (5 mg total) by mouth daily. (Patient taking differently: Take 5 mg by mouth at bedtime. ) 90 tablet 3  . LORazepam (ATIVAN) 0.5 MG tablet Take 1/2 tab daily PRN (Patient not taking: Reported on 11/10/2015) 15 tablet 0   No current facility-administered medications for this visit.    Allergies: Review of patient's allergies indicates no known allergies.   Social History: The patient  reports that he quit smoking about 22 years ago. His smoking use included Cigarettes. He has never used smokeless tobacco. He reports that he does not drink alcohol or use illicit drugs.   Family History: The patient's family history includes COPD in his father; Cancer in his brother; Crohn's disease in his sister; Diabetes in his mother; Heart disease in his sister; Heart failure in his mother; Hypertension in his mother and sister; Lung cancer in  his father; Thyroid disease in his sister; Tuberous sclerosis in his father.    ROS: Please see the history of present illness. All other systems are reviewed and negative.    PHYSICAL EXAM: Filed Vitals:   12/24/15  0616  BP: 127/85  Pulse: 72  Temp: 97.9 F (36.6 C)  Resp: 16   GEN: Well nourished, well developed, in no acute distress  HEENT: normal  Neck: no JVD, carotid bruits, or masses Cardiac: RRR; no murmurs, rubs, or gallops,no edema  Respiratory: clear to auscultation bilaterally, normal work of breathing GI: soft, nontender, nondistended, + BS MS: no deformity or atrophy  Skin: warm and dry  Neuro: Strength and sensation are intact Psych: euthymic mood, full affect  Other studies Reviewed: Additional studies/ records that were reviewed today include: Dr Lysbeth Penner notes, AF clinic notes, recent echo  Cardiac CT  ASSESSMENT AND PLAN:  1. Paroxysmal atrial fibrillation The patient has symptomatic recurrent atrial fibrillation. He has failed medical therapy with metoprolol and flecainide. Therapeutic strategies for afib including medicine and ablation were discussed in detail with the patient today. Risk, benefits, and alternatives to EP study and radiofrequency ablation for afib were also discussed in detail today. These risks include but are not limited to stroke, bleeding, vascular damage, tamponade, perforation, damage to the esophagus, lungs, and other structures, pulmonary vein stenosis, worsening renal function, and death. The patient understands these risk and wishes to proceed. Cardiac CT is reviewed with the patient and his questions are answered today.  2. HTN Stable No change required today  3. Nonischemic CM Likely tachycardia mediated I am optimistic that EF will improve with sinus rhythm  [name] MD, [facility] 12/24/2015 7:40 AM

## 2015-12-24 NOTE — Progress Notes (Signed)
Rt groin drsg stained with blood, removed and redressed w/ 2x2/tegaderm.  No bleeding, bruising or swelling.  Pt denies complaints, just "tired."  Pt up to bathroom for first time without difficulty, denies dizziness.  Advised pt to call RN for immediate assistance if any bleeding swelling or pain Rt groin.  Strong pedal pulse. Pt and wife voice understanding, deny needs.  VSS.

## 2015-12-24 NOTE — Discharge Summary (Signed)
Marland Kitchen    ELECTROPHYSIOLOGY PROCEDURE DISCHARGE SUMMARY    Patient ID: Troy Acosta,  MRN: [id number], DOB/AGE: 12-26-1950 65 y.o.  Admit date: 12/24/2015 Discharge date: 12/25/15  Primary Care Physician: Troy Gainer, MD Primary Cardiologist: Dr. Debara Acosta Electrophysiologist: Troy Grayer, MD  Primary Discharge Diagnosis:  1. Paroxysmal AFib     CHA2DS2Vasc is 2 on Xarelto  Secondary Discharge Diagnosis:  1. HTN 2. NICM  Procedures This Admission:  1.  Electrophysiology study and radiofrequency catheter ablation on 12/24/15 by Dr Troy Acosta.  This study demonstrated   CONCLUSIONS: 1. Sinus rhythm upon presentation.  2. Aneurysm atrial septum and common ostium to the left inferior and superior PVs by intracardiac echo. 3. Successful electrical isolation and anatomical encircling of all four pulmonary veins with radiofrequency current. 4. No inducible arrhythmias following ablation both  5. No early apparent complications.   Brief HPI: Troy Acosta is a 65 y.o. male with a history of paroxysmal atrial fibrillation.  They have failed medical therapy with metoprolol and Flecainide. Risks, benefits, and alternatives to catheter ablation of atrial fibrillation were reviewed with the patient who wished to proceed.  The patient underwent cardiac CT prior to the procedure which demonstrated no LAA thrombus and no CAD    Hospital Course:  The patient was admitted and underwent EPS/RFCA of atrial fibrillation with details as outlined above.  They were monitored on telemetry overnight which demonstrated sinus rhythm.  Groin was without complication on the day of discharge.  The patient was examined and considered to be stable for discharge.  Wound care and restrictions were reviewed with the patient.  The patient will be seen back by Troy Palau, NP in 4 weeks and Dr Troy Acosta in 12 weeks for post ablation follow up.      Physical Exam: Filed Vitals:   12/24/15 1934 12/24/15 2002 12/25/15 0000  12/25/15 0440  BP: 101/60 113/76  122/67  Pulse: 77   72  Temp: 98.8 F (37.1 C)   97.7 F (36.5 C)  TempSrc: Oral   Oral  Resp: 21 21 20 19   Height:      Weight:    245 lb 2.4 oz (111.2 kg)  SpO2: 100%   96%    GEN- The patient is well appearing, alert and oriented x 3 today.   HEENT: normocephalic, atraumatic; sclera clear, conjunctiva pink; hearing intact; oropharynx clear; neck supple  Lungs- Clear to ausculation bilaterally, normal work of breathing.  No wheezes, rales, rhonchi Heart- Regular rate and rhythm, no murmurs, rubs or gallops  GI- soft, non-tender, non-distended, bowel sounds present  Extremities- no clubbing, cyanosis, or edema; DP/PT/radial pulses 2+ bilaterally, groin without hematoma/bruit MS- no significant deformity or atrophy Skin- warm and dry, no rash or lesion Psych- euthymic mood, full affect Neuro- strength and sensation are intact   Labs:   Lab Results  Component Value Date   WBC 8.6 12/08/2015   HGB 15.9 12/08/2015   HCT 44.9 12/08/2015   MCV 86.0 12/08/2015   PLT 193 12/08/2015   No results for input(s): NA, K, CL, CO2, BUN, CREATININE, CALCIUM, PROT, BILITOT, ALKPHOS, ALT, AST, GLUCOSE in the last 168 hours.  Invalid input(s): LABALBU   Discharge Medications:    Medication List    TAKE these medications        FERREX 150 150 MG capsule  Generic drug:  iron polysaccharides  TAKE ONE CAPSULE BY MOUTH ONE TIME DAILY     hydrochlorothiazide 25 MG tablet  Commonly known as:  HYDRODIURIL  TAKE ONE TABLET BY MOUTH ONE  TIME DAILY     levothyroxine 25 MCG tablet  Commonly known as:  LEVOTHROID  Take 0.5 pill (12.5 mcg) every AM with levothyroxine 150 mcg to total 162.46mcg q day.     levothyroxine 150 MCG tablet  Commonly known as:  SYNTHROID, LEVOTHROID  Take 1 tablet (150 mcg total) by mouth daily before breakfast.     LORazepam 0.5 MG tablet  Commonly known as:  ATIVAN  Take 1/2 tab daily PRN     metoprolol tartrate 25 MG  tablet  Commonly known as:  LOPRESSOR  Take 0.5 tablets (12.5 mg total) by mouth every 12 (twelve) hours. May take extra half tablet as needed if HR greater than 100.     olmesartan 40 MG tablet  Commonly known as:  BENICAR  Take 20 mg by mouth daily.     pantoprazole 40 MG tablet  Commonly known as:  PROTONIX  Take 1 tablet (40 mg total) by mouth daily.     rivaroxaban 20 MG Tabs tablet  Commonly known as:  XARELTO  Take 1 tablet (20 mg total) by mouth daily with supper.     rosuvastatin 5 MG tablet  Commonly known as:  CRESTOR  Take 1 tablet (5 mg total) by mouth daily.     Vitamin D3 1000 units Caps  Take 1,000 Units by mouth at bedtime.        Disposition: Home  Follow-up Information    Follow up with [facility] On 01/24/2016.   Specialty:  Cardiology   Why:  11:30AM   Contact information:   [id number] [place] [phone]      Follow up with Troy Grayer, MD On 03/29/2016.   Specialty:  Cardiology   Why:  4:15PM   Contact information:   [place] [address] [phone]       Duration of Discharge Encounter: Greater than 30 minutes including physician time.  Troy Fossa MD, [facility] 12/25/2015 7:07 AM

## 2015-12-24 NOTE — Discharge Instructions (Signed)
No driving for 4 days. No lifting over 5 lbs for 1 week. No vigorous or sexual activity for 1 week. You may return to work on 12/31/15. Keep procedure site clean & dry. If you notice increased pain, swelling, bleeding or pus, call/return!  You may shower, but no soaking baths/hot tubs/pools for 1 week.    You have an appointment set up with the [facility].  Multiple studies have shown that being followed by a dedicated atrial fibrillation clinic in addition to the standard care you receive from your other physicians improves health. We believe that enrollment in the atrial fibrillation clinic will allow [place] to better care for you.   The phone number to the [facility] is [phone]. The clinic is staffed Monday through Friday from 8:30am to 5pm.  Parking Directions: The clinic is located in the Heart and Vascular Building connected to [facility]. 1)From [address] turn on to [address] and go to the 3rd entrance  (Heart and Vascular entrance) on the right. 2)Look to the right for Heart &Vascular Parking Garage. 3)A code for the entrance is required please call the clinic to receive this.   4)Take the elevators to the 1st floor. Registration is in the room with the glass walls at the end of the hallway.  If you have any trouble parking or locating the clinic, please dont hesitate to call [phone].

## 2015-12-24 NOTE — Progress Notes (Signed)
Site area: right groin Site Prior to Removal:  Level 0 Pressure Applied For:  15 minutes Manual:   yes Patient Status During Pull:  stable Post Pull Site:  Level  0 Post Pull Instructions Given:  yes Post Pull Pulses Present:  yes Dressing Applied:  Small tegaderm Bedrest begins @  [place] by Eddie Dibbles / finished by canderson

## 2015-12-24 NOTE — Progress Notes (Signed)
Visitor in room. Eating [place] sandwich.

## 2015-12-24 NOTE — Anesthesia Postprocedure Evaluation (Signed)
Anesthesia Post Note  Patient: Troy Acosta  Procedure(s) Performed: Procedure(s) (LRB): Atrial Fibrillation Ablation (N/A)  Patient location during evaluation: PACU Anesthesia Type: MAC Level of consciousness: awake and alert Pain management: pain level controlled Vital Signs Assessment: post-procedure vital signs reviewed and stable Respiratory status: spontaneous breathing, nonlabored ventilation, respiratory function stable and patient connected to nasal cannula oxygen Cardiovascular status: stable and blood pressure returned to baseline Anesthetic complications: no    Last Vitals:  Filed Vitals:   12/24/15 1125 12/24/15 1135  BP: 135/81 126/75  Pulse: 58 50  Temp:    Resp: 16 13    Last Pain: There were no vitals filed for this visit.               Effie Berkshire

## 2015-12-25 ENCOUNTER — Encounter (HOSPITAL_COMMUNITY): Payer: Self-pay | Admitting: Internal Medicine

## 2015-12-25 DIAGNOSIS — I10 Essential (primary) hypertension: Secondary | ICD-10-CM | POA: Diagnosis not present

## 2015-12-25 DIAGNOSIS — I429 Cardiomyopathy, unspecified: Secondary | ICD-10-CM | POA: Diagnosis not present

## 2015-12-25 DIAGNOSIS — I480 Paroxysmal atrial fibrillation: Secondary | ICD-10-CM

## 2015-12-25 DIAGNOSIS — E039 Hypothyroidism, unspecified: Secondary | ICD-10-CM | POA: Diagnosis not present

## 2015-12-25 MED ORDER — PANTOPRAZOLE SODIUM 40 MG PO TBEC: 40.0000 mg | DELAYED_RELEASE_TABLET | Freq: Every day | ORAL | Status: DC

## 2015-12-25 NOTE — Progress Notes (Signed)
Patient requested a note to return to work on 4/3. Dr. Rayann Heman notified and instructed to write a note for the patient to return to work on 4/3 as requested.

## 2016-01-07 ENCOUNTER — Telehealth: Payer: Self-pay | Admitting: Family Medicine

## 2016-01-07 NOTE — Telephone Encounter (Signed)
Patient aware.

## 2016-01-07 NOTE — Telephone Encounter (Signed)
I think all the antihistamines can be effective. My preferences fexofenadine or Allegra, but Zyrtec or Claritin are okay to another possibility would be Flonase. All are over-the-counter

## 2016-01-17 DIAGNOSIS — F419 Anxiety disorder, unspecified: Secondary | ICD-10-CM | POA: Diagnosis not present

## 2016-01-18 ENCOUNTER — Encounter (INDEPENDENT_AMBULATORY_CARE_PROVIDER_SITE_OTHER): Payer: Self-pay

## 2016-01-18 ENCOUNTER — Encounter: Payer: Self-pay | Admitting: Family Medicine

## 2016-01-18 ENCOUNTER — Ambulatory Visit (INDEPENDENT_AMBULATORY_CARE_PROVIDER_SITE_OTHER): Payer: BLUE CROSS/BLUE SHIELD | Admitting: Family Medicine

## 2016-01-18 ENCOUNTER — Encounter: Payer: Self-pay | Admitting: *Deleted

## 2016-01-18 VITALS — BP 126/83 | HR 72 | Temp 97.9°F | Ht 71.0 in | Wt 238.0 lb

## 2016-01-18 DIAGNOSIS — I480 Paroxysmal atrial fibrillation: Secondary | ICD-10-CM | POA: Diagnosis not present

## 2016-01-18 DIAGNOSIS — I428 Other cardiomyopathies: Secondary | ICD-10-CM

## 2016-01-18 DIAGNOSIS — D509 Iron deficiency anemia, unspecified: Secondary | ICD-10-CM

## 2016-01-18 DIAGNOSIS — R61 Generalized hyperhidrosis: Secondary | ICD-10-CM

## 2016-01-18 DIAGNOSIS — R0600 Dyspnea, unspecified: Secondary | ICD-10-CM

## 2016-01-18 DIAGNOSIS — I429 Cardiomyopathy, unspecified: Secondary | ICD-10-CM | POA: Diagnosis not present

## 2016-01-18 DIAGNOSIS — E785 Hyperlipidemia, unspecified: Secondary | ICD-10-CM | POA: Diagnosis not present

## 2016-01-18 NOTE — Progress Notes (Signed)
Subjective:    Patient ID: Troy Acosta, male    DOB: 11/17/1950, 65 y.o.   MRN: [id number]  HPI Patient here today for excessive sweating. This was discussed in the past and was told to wait to discuss until after his heart ablation. The heart ablation was done on 12/24/15. The patient says he is always had a lot of sweating even as a younger person. He especially sweats with exercise and activity. The amount of sweating that he has has increased significantly and this is especially noticed since his thyroid medicine was increased. Subsequent thyroid tests have been good and within normal limits. Using when he is sweating his blood pressure is lower at that time. He has more sweating in the morning. He also says that his heart rate may be lower at that time. UC when he is sweating his blood pressure is lower. It is becoming very bothersome. The cardiologist did not address this any further after getting his arrhythmia corrected. His medicines have been reviewed by the clinical pharmacists and no medication was found that they thought could be playing a role with his increased sweating. The patient says he's also eliminated off his caffeine. Since the ablation he is had no more atrial fibrillation and he can tell when he is having this.     Patient Active Problem List   Diagnosis Date Noted  . A-fib ([place]) 12/24/2015  . Non-ischemic cardiomyopathy ([place]) 11/07/2015  . Long term (current) use of anticoagulants 11/07/2015  . Diaphoresis 11/01/2015  . Chest pain 11/01/2015  . PAF (paroxysmal atrial fibrillation) ([place]) 07/15/2015  . Heart palpitations 07/11/2015  . Dyspnea 02/18/2015  . Anemia, iron deficiency 09/10/2013  . Diaphragmatic hernia without mention of obstruction or gangrene   . Hypothyroidism   . Malaise and fatigue   . Arteritis, unspecified ([place])   . Iron deficiency anemia, unspecified   . Obesity   . BPH (benign prostatic hyperplasia)   . Essential hypertension, benign   .  Hyperlipidemia    Outpatient Encounter Prescriptions as of 01/18/2016  Medication Sig  . Cholecalciferol (VITAMIN D3) 1000 UNITS CAPS Take 1,000 Units by mouth at bedtime.   Marland Kitchen FERREX 150 150 MG capsule TAKE ONE CAPSULE BY MOUTH ONE TIME DAILY (Patient taking differently: TAKE 150 MG BY MOUTH every evening)  . hydrochlorothiazide (HYDRODIURIL) 25 MG tablet TAKE ONE TABLET BY MOUTH ONE  TIME DAILY (Patient taking differently: TAKE 25 MG BY MOUTH ONE  TIME DAILY)  . levothyroxine (LEVOTHROID) 25 MCG tablet Take 0.5 pill (12.5 mcg) every AM with levothyroxine 150 mcg to total 162.26mg q day.  . levothyroxine (SYNTHROID, LEVOTHROID) 150 MCG tablet Take 1 tablet (150 mcg total) by mouth daily before breakfast.  . LORazepam (ATIVAN) 0.5 MG tablet Take 1/2 tab daily PRN  . metoprolol tartrate (LOPRESSOR) 25 MG tablet Take 0.5 tablets (12.5 mg total) by mouth every 12 (twelve) hours. May take extra half tablet as needed if HR greater than 100.  . olmesartan (BENICAR) 40 MG tablet Take 20 mg by mouth daily.  . pantoprazole (PROTONIX) 40 MG tablet Take 1 tablet (40 mg total) by mouth daily.  . rivaroxaban (XARELTO) 20 MG TABS tablet Take 1 tablet (20 mg total) by mouth daily with supper.  . rosuvastatin (CRESTOR) 5 MG tablet Take 1 tablet (5 mg total) by mouth daily. (Patient taking differently: Take 5 mg by mouth at bedtime. )   No facility-administered encounter medications on file as of 01/18/2016.  Review of Systems  Constitutional: Negative.   HENT: Negative.   Eyes: Negative.   Respiratory: Negative.   Cardiovascular: Negative.   Gastrointestinal: Negative.   Endocrine: Negative.        Excessive sweating  Genitourinary: Negative.   Musculoskeletal: Negative.   Skin: Negative.   Allergic/Immunologic: Negative.   Neurological: Negative.   Hematological: Negative.   Psychiatric/Behavioral: Negative.        Objective:   Physical Exam  Constitutional: He is oriented to person, place,  and time. He appears well-developed and well-nourished. No distress.  Alert and pleasant and not sweaty  HENT:  Head: Normocephalic and atraumatic.  Right Ear: External ear normal.  Left Ear: External ear normal.  Nose: Nose normal.  Mouth/Throat: Oropharynx is clear and moist. No oropharyngeal exudate.  Eyes: Conjunctivae and EOM are normal. Pupils are equal, round, and reactive to light. Right eye exhibits no discharge. Left eye exhibits no discharge. No scleral icterus.  Neck: Normal range of motion. Neck supple. No thyromegaly present.  No thyromegaly and no anterior cervical adenopathy  Cardiovascular: Normal rate, regular rhythm, normal heart sounds and intact distal pulses.   No murmur heard. The heart had a regular rate and rhythm at 72/m  Pulmonary/Chest: Effort normal and breath sounds normal. No respiratory distress. He has no wheezes. He has no rales. He exhibits no tenderness.  Clear anteriorly and posteriorly and no axillary adenopathy  Abdominal: Soft. Bowel sounds are normal. He exhibits no mass. There is no tenderness. There is no rebound and no guarding.  No liver or spleen enlargement and normal bowel sounds without bruits  Musculoskeletal: Normal range of motion. He exhibits no edema.  Lymphadenopathy:    He has no cervical adenopathy.  Neurological: He is alert and oriented to person, place, and time. He has normal reflexes. No cranial nerve deficit.  Skin: Skin is warm and dry. No rash noted.  Psychiatric: He has a normal mood and affect. His behavior is normal. Judgment and thought content normal.  Nursing note and vitals reviewed.  BP 126/83 mmHg  Pulse 72  Temp(Src) 97.9 F (36.6 C) (Oral)  Ht '5\' 11"'  (1.803 m)  Wt 238 lb (107.956 kg)  BMI 33.21 kg/m2        Assessment & Plan:  1. Paroxysmal atrial fibrillation (HCC) -This seems to be resolved with the ablation procedure  2. Non-ischemic cardiomyopathy ([place]) -Continue to follow-up with  cardiology  3. Dyspnea -The patient has had no more dyspnea since the ablation procedure  4. Anemia, iron deficiency -Check CBC today  5. Hyperlipidemia -Continue current treatment  6. Excessive sweating -Bring blood pressure readings back and pulse readings back with any associated symptoms -If lab work is negative we may consider referral to nephrology and/or endocrinology for further evaluation - BMP8+EGFR - CBC with Differential/Platelet - Hepatic function panel - Sedimentation rate - Thyroid Panel With TSH  No orders of the defined types were placed in this encounter.   Patient Instructions  We will call you with the lab results later this week At some point in the future once the lab work is returned and he returned blood pressure readings and heart rate readings associated with any symptoms we will discuss where we will go from here please return these readings in a couple weeks for review. Follow-up with cardiology as planned   Arrie Senate MD

## 2016-01-18 NOTE — Patient Instructions (Addendum)
We will call you with the lab results later this week At some point in the future once the lab work is returned and he returned blood pressure readings and heart rate readings associated with any symptoms we will discuss where we will go from here please return these readings in a couple weeks for review. Follow-up with cardiology as planned

## 2016-01-19 ENCOUNTER — Other Ambulatory Visit: Payer: Self-pay | Admitting: *Deleted

## 2016-01-19 LAB — CBC WITH DIFFERENTIAL/PLATELET
Basophils Absolute: 0 10*3/uL (ref 0.0–0.2)
Basos: 0 %
EOS (ABSOLUTE): 0.1 10*3/uL (ref 0.0–0.4)
EOS: 1 %
HEMATOCRIT: 43.8 % (ref 37.5–51.0)
Hemoglobin: 14.7 g/dL (ref 12.6–17.7)
Immature Grans (Abs): 0 10*3/uL (ref 0.0–0.1)
Immature Granulocytes: 0 %
LYMPHS ABS: 2.1 10*3/uL (ref 0.7–3.1)
Lymphs: 23 %
MCH: 29.5 pg (ref 26.6–33.0)
MCHC: 33.6 g/dL (ref 31.5–35.7)
MCV: 88 fL (ref 79–97)
MONOS ABS: 0.8 10*3/uL (ref 0.1–0.9)
Monocytes: 9 %
NEUTROS ABS: 6.3 10*3/uL (ref 1.4–7.0)
Neutrophils: 67 %
Platelets: 209 10*3/uL (ref 150–379)
RBC: 4.98 x10E6/uL (ref 4.14–5.80)
RDW: 13.2 % (ref 12.3–15.4)
WBC: 9.3 10*3/uL (ref 3.4–10.8)

## 2016-01-19 LAB — HEPATIC FUNCTION PANEL
ALBUMIN: 4.2 g/dL (ref 3.6–4.8)
ALT: 22 IU/L (ref 0–44)
AST: 22 IU/L (ref 0–40)
Alkaline Phosphatase: 75 IU/L (ref 39–117)
Bilirubin Total: 0.6 mg/dL (ref 0.0–1.2)
Bilirubin, Direct: 0.17 mg/dL (ref 0.00–0.40)
Total Protein: 7.1 g/dL (ref 6.0–8.5)

## 2016-01-19 LAB — BMP8+EGFR
BUN / CREAT RATIO: 17 (ref 10–24)
BUN: 16 mg/dL (ref 8–27)
CHLORIDE: 95 mmol/L — AB (ref 96–106)
CO2: 28 mmol/L (ref 18–29)
Calcium: 9.2 mg/dL (ref 8.6–10.2)
Creatinine, Ser: 0.96 mg/dL (ref 0.76–1.27)
GFR calc Af Amer: 98 mL/min/{1.73_m2} (ref >59)
GFR calc non Af Amer: 84 mL/min/{1.73_m2} (ref >59)
GLUCOSE: 91 mg/dL (ref 65–99)
Potassium: 3.7 mmol/L (ref 3.5–5.2)
SODIUM: 140 mmol/L (ref 134–144)

## 2016-01-19 LAB — THYROID PANEL WITH TSH
FREE THYROXINE INDEX: 3.4 (ref 1.2–4.9)
T3 UPTAKE RATIO: 29 % (ref 24–39)
T4, Total: 11.7 ug/dL (ref 4.5–12.0)
TSH: 2.2 u[IU]/mL (ref 0.450–4.500)

## 2016-01-19 LAB — SEDIMENTATION RATE: SED RATE: 2 mm/h (ref 0–30)

## 2016-01-20 MED ORDER — PANTOPRAZOLE SODIUM 40 MG PO TBEC: 40.0000 mg | DELAYED_RELEASE_TABLET | Freq: Every day | ORAL | Status: DC

## 2016-01-20 NOTE — Telephone Encounter (Signed)
Please fill

## 2016-01-20 NOTE — Telephone Encounter (Signed)
Filled per KL

## 2016-01-24 ENCOUNTER — Ambulatory Visit (HOSPITAL_COMMUNITY)
Admission: RE | Admit: 2016-01-24 | Discharge: 2016-01-24 | Disposition: A | Discharge status: Home / Self Care | Payer: BLUE CROSS/BLUE SHIELD | Source: Ambulatory Visit | Attending: Nurse Practitioner | Admitting: Nurse Practitioner

## 2016-01-24 ENCOUNTER — Encounter (HOSPITAL_COMMUNITY): Payer: Self-pay | Admitting: Nurse Practitioner

## 2016-01-24 VITALS — BP 122/88 | HR 63 | Ht 71.0 in | Wt 243.8 lb

## 2016-01-24 DIAGNOSIS — I480 Paroxysmal atrial fibrillation: Secondary | ICD-10-CM | POA: Insufficient documentation

## 2016-01-24 DIAGNOSIS — D509 Iron deficiency anemia, unspecified: Secondary | ICD-10-CM | POA: Insufficient documentation

## 2016-01-24 DIAGNOSIS — Z79899 Other long term (current) drug therapy: Secondary | ICD-10-CM | POA: Insufficient documentation

## 2016-01-24 DIAGNOSIS — Z825 Family history of asthma and other chronic lower respiratory diseases: Secondary | ICD-10-CM | POA: Insufficient documentation

## 2016-01-24 DIAGNOSIS — Z8249 Family history of ischemic heart disease and other diseases of the circulatory system: Secondary | ICD-10-CM | POA: Insufficient documentation

## 2016-01-24 DIAGNOSIS — Z6834 Body mass index (BMI) 34.0-34.9, adult: Secondary | ICD-10-CM | POA: Insufficient documentation

## 2016-01-24 DIAGNOSIS — Z833 Family history of diabetes mellitus: Secondary | ICD-10-CM | POA: Insufficient documentation

## 2016-01-24 DIAGNOSIS — E039 Hypothyroidism, unspecified: Secondary | ICD-10-CM | POA: Insufficient documentation

## 2016-01-24 DIAGNOSIS — I4891 Unspecified atrial fibrillation: Secondary | ICD-10-CM | POA: Diagnosis present

## 2016-01-24 DIAGNOSIS — Z8349 Family history of other endocrine, nutritional and metabolic diseases: Secondary | ICD-10-CM | POA: Insufficient documentation

## 2016-01-24 DIAGNOSIS — E785 Hyperlipidemia, unspecified: Secondary | ICD-10-CM | POA: Diagnosis not present

## 2016-01-24 DIAGNOSIS — Z7901 Long term (current) use of anticoagulants: Secondary | ICD-10-CM | POA: Diagnosis not present

## 2016-01-24 DIAGNOSIS — Z87891 Personal history of nicotine dependence: Secondary | ICD-10-CM | POA: Insufficient documentation

## 2016-01-24 DIAGNOSIS — E669 Obesity, unspecified: Secondary | ICD-10-CM | POA: Insufficient documentation

## 2016-01-24 DIAGNOSIS — I10 Essential (primary) hypertension: Secondary | ICD-10-CM | POA: Insufficient documentation

## 2016-01-24 NOTE — Progress Notes (Signed)
Patient ID: Troy Acosta, male   DOB: [birth year], 65 y.o.   MRN: [id number]     Primary Care Physician: Redge Gainer, MD Referring Physician: Dr. Massie Bougie Surace is a 65 y.o. male with a h/o afib ablation 12/24/15 in afib clinic for f/u ablation. He reports that he has done well without any afib. He denies rt groin pain/ dysphagia. He is having sweating spells at work and his PCP put on a portable BP cuff to try evaluate. The sweating spells preceded afib. He has seen some low BP readings around 90 systolic but did not sweat with the lower systolic reading. He has not missed any doses of xarelto  with chadsvasc score of 2(htn, EF 45-50%). He would like to have rt knee replaced later this summer or fall.  Today, he denies symptoms of palpitations, chest pain, shortness of breath, orthopnea, PND, lower extremity edema, dizziness, presyncope, syncope, or neurologic sequela. The patient is tolerating medications without difficulties and is otherwise without complaint today.   Past Medical History  Diagnosis Date  . Diaphragmatic hernia without mention of obstruction or gangrene   . Unspecified hypothyroidism   . Malaise and fatigue   . Arteritis, unspecified ([place])   . Iron deficiency anemia, unspecified   . Obesity   . Hyperplasia of prostate   . Essential hypertension, benign   . Other and unspecified hyperlipidemia   . Paroxysmal atrial fibrillation [facility])    Past Surgical History  Procedure Laterality Date  . Electrophysiologic study N/A 12/24/2015    Procedure: Atrial Fibrillation Ablation;  Surgeon: [name], MD;  Location: [facility];  Service: Cardiovascular;  Laterality: N/A;    Current Outpatient Prescriptions  Medication Sig Dispense Refill  . Cholecalciferol (VITAMIN D3) 1000 UNITS CAPS Take 1,000 Units by mouth at bedtime.     Marland Kitchen FERREX 150 150 MG capsule TAKE ONE CAPSULE BY MOUTH ONE TIME DAILY (Patient taking differently: TAKE 150 MG BY MOUTH every evening) 90  capsule 1  . hydrochlorothiazide (HYDRODIURIL) 25 MG tablet TAKE ONE TABLET BY MOUTH ONE  TIME DAILY (Patient taking differently: TAKE 25 MG BY MOUTH ONE  TIME DAILY) 90 tablet 1  . levothyroxine (LEVOTHROID) 25 MCG tablet Take 0.5 pill (12.5 mcg) every AM with levothyroxine 150 mcg to total 162.27mcg q day. 45 tablet 6  . levothyroxine (SYNTHROID, LEVOTHROID) 150 MCG tablet Take 1 tablet (150 mcg total) by mouth daily before breakfast. 90 tablet 3  . LORazepam (ATIVAN) 0.5 MG tablet Take 1/2 tab daily PRN 15 tablet 0  . metoprolol tartrate (LOPRESSOR) 25 MG tablet Take 0.5 tablets (12.5 mg total) by mouth every 12 (twelve) hours. May take extra half tablet as needed if HR greater than 100. 5 tablet 0  . olmesartan (BENICAR) 40 MG tablet Take 20 mg by mouth daily.    . pantoprazole (PROTONIX) 40 MG tablet Take 1 tablet (40 mg total) by mouth daily. 90 tablet 3  . rivaroxaban (XARELTO) 20 MG TABS tablet Take 1 tablet (20 mg total) by mouth daily with supper. 90 tablet 3  . rosuvastatin (CRESTOR) 5 MG tablet Take 1 tablet (5 mg total) by mouth daily. (Patient taking differently: Take 5 mg by mouth at bedtime. ) 90 tablet 3   No current facility-administered medications for this encounter.    No Known Allergies  Social History   Social History  . Marital Status: Married    Spouse Name: N/A  . Number of Children: N/A  .  Years of Education: 12   Occupational History  . electrician    Social History Main Topics  . Smoking status: Former Smoker    Types: Cigarettes    Quit date: 12/06/1992  . Smokeless tobacco: Never Used  . Alcohol Use: No  . Drug Use: No  . Sexual Activity:    Partners: Female   Other Topics Concern  . Not on file   Social History Narrative    Family History  Problem Relation Age of Onset  . Heart failure Mother   . Hypertension Mother   . Diabetes Mother   . Lung cancer Father   . COPD Father   . Tuberous sclerosis Father   . Heart disease Sister   .  Cancer Brother   . Hypertension Sister   . Thyroid disease Sister   . Crohn's disease Sister     ROS- All systems are reviewed and negative except as per the HPI above  Physical Exam: Filed Vitals:   01/24/16 1525  BP: 122/88  Pulse: 63  Height: 5\' 11"  (1.803 m)  Weight: 243 lb 12.8 oz (110.587 kg)    GEN- The patient is well appearing, alert and oriented x 3 today.   Head- normocephalic, atraumatic Eyes-  Sclera clear, conjunctiva pink Ears- hearing intact Oropharynx- clear Neck- supple, no JVP Lymph- no cervical lymphadenopathy Lungs- Clear to ausculation bilaterally, normal work of breathing Heart- Regular rate and rhythm, no murmurs, rubs or gallops, PMI not laterally displaced GI- soft, NT, ND, + BS Extremities- no clubbing, cyanosis, or edema MS- no significant deformity or atrophy Skin- no rash or lesion Psych- euthymic mood, full affect Neuro- strength and sensation are intact  EKG-NSR at 63 bpm, Pr int 136 ms, qrs int 94 ms, qtc 392 ms Epic records reviewed  Assessment and Plan: 1. PAF Successful ablation without any afib reoccurrence  Continue xarelto Continue metoprolol  2. HTN  Wearing 24 hour monitor for sweating spells Per PCP  F/u with Dr. Rayann Heman in June Afib clinic as needed

## 2016-01-26 ENCOUNTER — Telehealth: Payer: Self-pay | Admitting: Family Medicine

## 2016-01-26 NOTE — Telephone Encounter (Signed)
I am aware of this.

## 2016-01-27 ENCOUNTER — Telehealth: Payer: Self-pay | Admitting: *Deleted

## 2016-01-27 DIAGNOSIS — R61 Generalized hyperhidrosis: Secondary | ICD-10-CM

## 2016-01-27 NOTE — Telephone Encounter (Signed)
Spoke with Pt about 24 hour BP  - will be scanned to record.

## 2016-02-01 ENCOUNTER — Telehealth: Payer: Self-pay | Admitting: Family Medicine

## 2016-02-01 NOTE — Telephone Encounter (Signed)
Spoke with Troy Acosta's wife and advised the referral has been authorized and they should be hearing something shortly from the Endocrinology office. Troy Acosta's wife Olin Hauser voiced understanding and advised to call back if they don't hear anything over the next few days.

## 2016-02-03 ENCOUNTER — Encounter: Payer: Self-pay | Admitting: *Deleted

## 2016-02-07 ENCOUNTER — Telehealth: Payer: Self-pay | Admitting: Family Medicine

## 2016-02-07 NOTE — Telephone Encounter (Signed)
Patient's wife called stating that patient has had heart surgery.  A referral has been made to see Endocrinology for 05/25 but patient feels like he needs to be seen sooner.

## 2016-02-14 ENCOUNTER — Encounter: Payer: Self-pay | Admitting: Family Medicine

## 2016-02-14 ENCOUNTER — Ambulatory Visit (INDEPENDENT_AMBULATORY_CARE_PROVIDER_SITE_OTHER): Payer: BLUE CROSS/BLUE SHIELD | Admitting: Family Medicine

## 2016-02-14 ENCOUNTER — Ambulatory Visit (INDEPENDENT_AMBULATORY_CARE_PROVIDER_SITE_OTHER): Payer: BLUE CROSS/BLUE SHIELD

## 2016-02-14 VITALS — BP 132/90 | HR 70 | Temp 97.0°F | Ht 71.0 in | Wt 241.0 lb

## 2016-02-14 DIAGNOSIS — R059 Cough, unspecified: Secondary | ICD-10-CM

## 2016-02-14 DIAGNOSIS — R61 Generalized hyperhidrosis: Secondary | ICD-10-CM | POA: Diagnosis not present

## 2016-02-14 DIAGNOSIS — I480 Paroxysmal atrial fibrillation: Secondary | ICD-10-CM

## 2016-02-14 DIAGNOSIS — R05 Cough: Secondary | ICD-10-CM

## 2016-02-14 MED ORDER — LORAZEPAM 0.5 MG PO TABS: ORAL_TABLET | ORAL | Status: DC

## 2016-02-14 NOTE — Progress Notes (Signed)
Subjective:    Patient ID: Troy Acosta, male    DOB: 1951-07-18, 65 y.o.   MRN: [id number]  HPI Patient here today for follow up on sweating and BP. He is also having some cough off and on. The patient has an upcoming appointment with Dr. Dorris Fetch, endocrinologist. The patient brings in blood pressure readings today for review and I do not see any correlation of his sweating symptoms with blood pressure readings or during the time of the day but he seems to have more sweating episodes in the morning. His blood pressure readings run from the low 100s to up to the 120s and 30s. The pulse rates are running in the 50s and 60s. He also had a 24-hour blood pressure monitor done and we will try to give him a copy of this report before he leaves. He does complain of a cough off and on and would like to get a chest x-ray. The patient has had a slight cough. He denies any chest pain or shortness of breath. He says the sweating happens every day whether is working are not working. It is a profuse sweating. He cannot associate it with his blood pressure or with his heart rate. He brings in a diary with blood pressure readings and heart rates and there is no association apparent after reviewing that today. He's having no GI symptoms and no trouble passing his water. He does express the thought that this could be anxiety related although he does not appear like an anxious person to me. He says that taking the lorazepam dramatically affects the sweating and improves it. This is only taking it once a day. He also had the clinical pharmacist to review his medications and she did not see anything medicine wise it could be contributing to the sweating. We made copies of his recent lab work including a sedimentation rate and thyroid profile and CBC and cardiac CT and over read chest x-ray from the radiologist and gave these to him today to take to the endocrinologist.    Patient Active Problem List   Diagnosis Date Noted  .  A-fib ([place]) 12/24/2015  . Non-ischemic cardiomyopathy ([place]) 11/07/2015  . Long term (current) use of anticoagulants 11/07/2015  . Diaphoresis 11/01/2015  . Chest pain 11/01/2015  . PAF (paroxysmal atrial fibrillation) ([place]) 07/15/2015  . Heart palpitations 07/11/2015  . Dyspnea 02/18/2015  . Anemia, iron deficiency 09/10/2013  . Diaphragmatic hernia without mention of obstruction or gangrene   . Hypothyroidism   . Malaise and fatigue   . Arteritis, unspecified ([place])   . Iron deficiency anemia, unspecified   . Obesity   . BPH (benign prostatic hyperplasia)   . Essential hypertension, benign   . Hyperlipidemia    Outpatient Encounter Prescriptions as of 02/14/2016  Medication Sig  . Cholecalciferol (VITAMIN D3) 1000 UNITS CAPS Take 1,000 Units by mouth at bedtime.   Marland Kitchen FERREX 150 150 MG capsule TAKE ONE CAPSULE BY MOUTH ONE TIME DAILY (Patient taking differently: TAKE 150 MG BY MOUTH every evening)  . hydrochlorothiazide (HYDRODIURIL) 25 MG tablet TAKE ONE TABLET BY MOUTH ONE  TIME DAILY (Patient taking differently: TAKE 25 MG BY MOUTH ONE  TIME DAILY)  . levothyroxine (LEVOTHROID) 25 MCG tablet Take 0.5 pill (12.5 mcg) every AM with levothyroxine 150 mcg to total 162.20mcg q day.  . levothyroxine (SYNTHROID, LEVOTHROID) 150 MCG tablet Take 1 tablet (150 mcg total) by mouth daily before breakfast.  . LORazepam (ATIVAN) 0.5 MG tablet Take 1/2  tab daily PRN  . metoprolol tartrate (LOPRESSOR) 25 MG tablet Take 0.5 tablets (12.5 mg total) by mouth every 12 (twelve) hours. May take extra half tablet as needed if HR greater than 100.  . olmesartan (BENICAR) 40 MG tablet Take 20 mg by mouth daily.  . pantoprazole (PROTONIX) 40 MG tablet Take 1 tablet (40 mg total) by mouth daily.  . rivaroxaban (XARELTO) 20 MG TABS tablet Take 1 tablet (20 mg total) by mouth daily with supper.  . rosuvastatin (CRESTOR) 5 MG tablet Take 1 tablet (5 mg total) by mouth daily. (Patient taking differently: Take 5 mg by  mouth at bedtime. )   No facility-administered encounter medications on file as of 02/14/2016.      Review of Systems  Constitutional: Negative.   HENT: Negative.   Eyes: Negative.   Respiratory: Positive for cough.   Cardiovascular: Negative.   Gastrointestinal: Negative.   Endocrine: Negative.        Excessive sweating  Genitourinary: Negative.   Musculoskeletal: Negative.   Skin: Negative.   Allergic/Immunologic: Negative.   Neurological: Negative.   Hematological: Negative.   Psychiatric/Behavioral: Negative.        Objective:   Physical Exam  Constitutional: He is oriented to person, place, and time. He appears well-developed and well-nourished. No distress.  HENT:  Head: Normocephalic and atraumatic.  Right Ear: External ear normal.  Left Ear: External ear normal.  Nose: Nose normal.  Mouth/Throat: Oropharynx is clear and moist. No oropharyngeal exudate.  Eyes: Conjunctivae and EOM are normal. Pupils are equal, round, and reactive to light. Right eye exhibits no discharge. Left eye exhibits no discharge. No scleral icterus.  Neck: Normal range of motion. Neck supple. No thyromegaly present.  Cardiovascular: Normal rate, regular rhythm and normal heart sounds.   No murmur heard. The heart is regular at 72/m  Pulmonary/Chest: Effort normal and breath sounds normal. No respiratory distress. He has no wheezes. He has no rales. He exhibits no tenderness.  Clear anteriorly and posteriorly with no wheezes rales or rhonchi.  Abdominal: Soft. Bowel sounds are normal. There is no tenderness. There is no rebound and no guarding.  Musculoskeletal: Normal range of motion. He exhibits no edema.  Lymphadenopathy:    He has no cervical adenopathy.  Neurological: He is alert and oriented to person, place, and time.  Skin: Skin is warm and dry. No rash noted.  Psychiatric: He has a normal mood and affect. His behavior is normal. Judgment and thought content normal.  Nursing note and  vitals reviewed.  BP 132/90 mmHg  Pulse 70  Temp(Src) 97 F (36.1 C) (Oral)  Ht 5\' 11"  (1.803 m)  Wt 241 lb (109.317 kg)  BMI 33.63 kg/m2        Assessment & Plan:  1. Cough -We will repeat the chest x-ray make sure there is a comparison done 2 x-rays that have been done earlier in the year. - DG Chest 2 View; Future  2. Excessive sweating -Appointment with endocrinology for further evaluation -Take all lab work and x-ray reports with him to this visit  3. Paroxysmal atrial fibrillation ([facility]) -Continue to follow-up with cardiology as planned  Meds ordered this encounter  Medications  . LORazepam (ATIVAN) 0.5 MG tablet    Sig: Take 1/2 to 1 whole tab daily PRN    Dispense:  30 tablet    Refill:  1   Patient Instructions  Keep appointment with endocrinologist Make sure you remember the history of all of  this and relay this to him with the visit you have with him Continue the lorazepam as needed   Arrie Senate MD

## 2016-02-14 NOTE — Patient Instructions (Signed)
Keep appointment with endocrinologist Make sure you remember the history of all of this and relay this to him with the visit you have with him Continue the lorazepam as needed

## 2016-03-02 ENCOUNTER — Encounter: Payer: Self-pay | Admitting: "Endocrinology

## 2016-03-02 ENCOUNTER — Ambulatory Visit (INDEPENDENT_AMBULATORY_CARE_PROVIDER_SITE_OTHER): Payer: BLUE CROSS/BLUE SHIELD | Admitting: "Endocrinology

## 2016-03-02 VITALS — BP 126/77 | HR 68 | Ht 71.0 in | Wt 240.0 lb

## 2016-03-02 DIAGNOSIS — E038 Other specified hypothyroidism: Secondary | ICD-10-CM | POA: Insufficient documentation

## 2016-03-02 DIAGNOSIS — R61 Generalized hyperhidrosis: Secondary | ICD-10-CM | POA: Insufficient documentation

## 2016-03-02 NOTE — Progress Notes (Signed)
Subjective:    Patient ID: Troy Acosta, male    DOB: [birth year], PCP Redge Gainer, MD   Past Medical History  Diagnosis Date  . Diaphragmatic hernia without mention of obstruction or gangrene   . Unspecified hypothyroidism   . Malaise and fatigue   . Arteritis, unspecified ([place])   . Iron deficiency anemia, unspecified   . Obesity   . Hyperplasia of prostate   . Essential hypertension, benign   . Other and unspecified hyperlipidemia   . Paroxysmal atrial fibrillation [facility])    Past Surgical History  Procedure Laterality Date  . Electrophysiologic study N/A 12/24/2015    Procedure: Atrial Fibrillation Ablation;  Surgeon: [name], MD;  Location: [facility];  Service: Cardiovascular;  Laterality: N/A;   Social History   Social History  . Marital Status: Married    Spouse Name: N/A  . Number of Children: N/A  . Years of Education: 12   Occupational History  . electrician    Social History Main Topics  . Smoking status: Former Smoker    Types: Cigarettes    Quit date: 12/06/1992  . Smokeless tobacco: Never Used  . Alcohol Use: No  . Drug Use: No  . Sexual Activity:    Partners: Female   Other Topics Concern  . None   Social History Narrative   Outpatient Encounter Prescriptions as of 03/02/2016  Medication Sig  . olmesartan (BENICAR) 20 MG tablet Take 20 mg by mouth daily.  . Cholecalciferol (VITAMIN D3) 1000 UNITS CAPS Take 1,000 Units by mouth at bedtime.   Marland Kitchen FERREX 150 150 MG capsule TAKE ONE CAPSULE BY MOUTH ONE TIME DAILY (Patient taking differently: TAKE 150 MG BY MOUTH every evening)  . hydrochlorothiazide (HYDRODIURIL) 25 MG tablet TAKE ONE TABLET BY MOUTH ONE  TIME DAILY (Patient taking differently: TAKE 25 MG BY MOUTH ONE  TIME DAILY)  . levothyroxine (SYNTHROID, LEVOTHROID) 150 MCG tablet Take 1 tablet (150 mcg total) by mouth daily before breakfast.  . LORazepam (ATIVAN) 0.5 MG tablet Take 1/2 to 1 whole tab daily PRN  . metoprolol tartrate  (LOPRESSOR) 25 MG tablet Take 0.5 tablets (12.5 mg total) by mouth every 12 (twelve) hours. May take extra half tablet as needed if HR greater than 100.  . rivaroxaban (XARELTO) 20 MG TABS tablet Take 1 tablet (20 mg total) by mouth daily with supper.  . rosuvastatin (CRESTOR) 5 MG tablet Take 1 tablet (5 mg total) by mouth daily. (Patient taking differently: Take 5 mg by mouth at bedtime. )  . [DISCONTINUED] levothyroxine (LEVOTHROID) 25 MCG tablet Take 0.5 pill (12.5 mcg) every AM with levothyroxine 150 mcg to total 162.22mcg q day.  . [DISCONTINUED] olmesartan (BENICAR) 40 MG tablet Take 20 mg by mouth daily.   No facility-administered encounter medications on file as of 03/02/2016.   ALLERGIES: No Known Allergies VACCINATION STATUS: Immunization History  Administered Date(s) Administered  . Influenza Split 07/29/2013  . Influenza Whole 07/09/2010  . Influenza-Unspecified 07/30/2014, 07/22/2015  . Td 03/10/2003  . Zoster 05/20/2013    HPI  65 year old gentleman with medical history as above. He is being seen in consultation for progressively worsening switching involving the upper torso and head. This consult is requested by Dr. Laurance Flatten. He noticed that this current symptom in started around September. A few weeks prior to that he is thyroid hormone dose was changed from 150 g -175 g.  He has history of mild bronchitis however he never was diagnosed with  COPD. He denies any personal or family history of pituitary, adrenal function. He denies wheezing/diarrhea. -The sweating is significant to affect his daily routine, and at times associated with hot flushes. -He denies fainting, pounding headaches, nor palpitations. -On further interview he reports that he has very low libido. He denies any history of injury to his testicles, radiation or surgery in the groin.   Review of Systems Constitutional: no weight gain/loss, no fatigue,  +subjective hyperthermia Eyes: no blurry vision, no  xerophthalmia ENT: no sore throat, no nodules palpated in throat, no dysphagia/odynophagia, no hoarseness Cardiovascular: no CP/SOB/palpitations/leg swelling Respiratory: no cough/SOB Gastrointestinal: no N/V/D/C Musculoskeletal: no muscle/joint aches Skin: no rashes, + excessive sweating associated with hot flushes. Neurological: no tremors/numbness/tingling/dizziness Psychiatric: no depression/anxiety  Objective:    BP 126/77 mmHg  Pulse 68  Ht 5\' 11"  (1.803 m)  Wt 240 lb (108.863 kg)  BMI 33.49 kg/m2  SpO2 97%  Wt Readings from Last 3 Encounters:  03/02/16 240 lb (108.863 kg)  02/14/16 241 lb (109.317 kg)  01/24/16 243 lb 12.8 oz (110.587 kg)    Physical Exam Constitutional: overweight, in NAD Eyes: PERRLA, EOMI, no exophthalmos ENT: moist mucous membranes, no thyromegaly, no cervical lymphadenopathy Cardiovascular: RRR, No MRG Respiratory: CTA B Gastrointestinal: abdomen soft, NT, ND, BS+ Musculoskeletal: no deformities, strength intact in all 4 Skin: moist, warm, no rashes, sun tanning on noncovered skin Genitourinary: His external genital exam is normal including normal size testes bilaterally. Neurological: no tremor with outstretched hands, DTR normal in all 4  CMP     Component Value Date/Time   NA 140 01/18/2016 1523   NA 138 12/08/2015 0931   K 3.7 01/18/2016 1523   CL 95* 01/18/2016 1523   CO2 28 01/18/2016 1523   GLUCOSE 91 01/18/2016 1523   GLUCOSE 105* 12/08/2015 0931   BUN 16 01/18/2016 1523   BUN 17 12/08/2015 0931   CREATININE 0.96 01/18/2016 1523   CREATININE 1.02 12/08/2015 0931   CALCIUM 9.2 01/18/2016 1523   PROT 7.1 01/18/2016 1523   ALBUMIN 4.2 01/18/2016 1523   AST 22 01/18/2016 1523   ALT 22 01/18/2016 1523   ALKPHOS 75 01/18/2016 1523   BILITOT 0.6 01/18/2016 1523   BILITOT 0.6 09/10/2013 1145   GFRNONAA 84 01/18/2016 1523   GFRAA 98 01/18/2016 1523     Lipid Panel ( most recent) Lipid Panel     Component Value Date/Time    CHOL 133 12/24/2014 1710   TRIG 139 12/24/2014 1710   HDL 39* 12/24/2014 1710    - Further review of his records show that his TSH was 2.2, total T4 was 11.7 on 01/18/2016.      Assessment & Plan:   1. Excessive sweating 2. Hypothyroidism  -I have reviewed his available records and evaluated patient clinically. - etiology for diaphoresis unclear for now, likely multifactorial including iatrogenic thyrotoxicosis, hypogonadism, medications, and rare endocrine dysfunction. - I  Have had a long discussion with him about work up which will be done step-by-step. The first step would be eliminating possible offenders among his current medications. -Since he is thyroid function tests indicate high normal levels, I advised him to lower his Synthroid to 150 g by mouth every morning.  - We discussed about correct intake of levothyroxine, at fasting, with water, separated by at least 30 minutes from breakfast, and separated by more than 4 hours from calcium, iron, multivitamins, acid reflux medications (PPIs). -Patient is made aware of the fact that thyroid hormone replacement  is needed for life, dose to be adjusted by periodic monitoring of thyroid function tests.  - Possible contribution of hypogonadism is considered in this patient. I will obtain total AM testosterone in 1 week. - A rare but possible differentials include carcinoid syndrome/pheochromocytoma. I will obtain 24-hour urine to study catecholamines, metanephrines, 5-HIAA.  - I advised patient to maintain close follow up with Redge Gainer, MD for primary care needs, and to cardiologist for his cardiac care needs. Follow up plan: Return for follow up with pre-visit labs.  Glade Lloyd, MD Phone: [phone]  Fax: [phone]   03/02/2016, 1:13 PM

## 2016-03-07 ENCOUNTER — Encounter: Payer: Self-pay | Admitting: Family Medicine

## 2016-03-07 ENCOUNTER — Ambulatory Visit (INDEPENDENT_AMBULATORY_CARE_PROVIDER_SITE_OTHER): Payer: BLUE CROSS/BLUE SHIELD

## 2016-03-07 ENCOUNTER — Ambulatory Visit (INDEPENDENT_AMBULATORY_CARE_PROVIDER_SITE_OTHER): Payer: BLUE CROSS/BLUE SHIELD | Admitting: Family Medicine

## 2016-03-07 VITALS — BP 131/81 | HR 68 | Temp 98.2°F | Ht 71.0 in | Wt 238.8 lb

## 2016-03-07 DIAGNOSIS — R059 Cough, unspecified: Secondary | ICD-10-CM

## 2016-03-07 DIAGNOSIS — R05 Cough: Secondary | ICD-10-CM

## 2016-03-07 DIAGNOSIS — M546 Pain in thoracic spine: Secondary | ICD-10-CM | POA: Diagnosis not present

## 2016-03-07 NOTE — Progress Notes (Signed)
   HPI  Patient presents today here with cough and back pain.  Patient's symptoms for about 5 days, described as cough but slightly worse whenever he lays down at night. Described back pain is right sided lower thoracic/CVA area back pain described as dull and persistent. Worse with deep inspiration and cough No injury. No shortness of breath, fever, chills, sweats, or difficulty tolerating food or fluids.  He has had a recent medical history of A. fib status post ablation.  He's had a recent decrease in his Synthroid dose.  PMH: Smoking status noted ROS: Per HPI  Denies any hematuria or dysuria.  Objective: BP 131/81 mmHg  Pulse 68  Temp(Src) 98.2 F (36.8 C) (Oral)  Ht 5\' 11"  (1.803 m)  Wt 238 lb 12.8 oz (108.319 kg)  BMI 33.32 kg/m2 Gen: NAD, alert, cooperative with exam HEENT: NCAT, nares with swollen mucosa, turbinates not swollen, oropharynx with some cobblestoning CV: RRR, good S1/S2, no murmur Resp: CTABL, no wheezes, non-labored Ext: No edema, warm Neuro: Alert and oriented, No gross deficits  MSK: No tenderness to palpation of the thoracic paraspinal muscles, lumbar paraspinal muscles, or CVA area  Chest x-ray: No infiltrate, no acute findings  Assessment and plan:  # Cough New problem Unclear etiology, cough is possibly GERD related or postnasal drip Some cobblestoning of the pharynx Flonase twice daily 1 week then once daily, also short course of dexilant to rule out GERD (5 days sample given) CXR clear- no infiltrate on my read   # Back pain Unclear etiology, possibly muscle strain after cough Reassurance provided Follow-up if worsening or does not improve as expected   Troy Apple, MD [facility] 03/07/2016, 2:29 PM

## 2016-03-07 NOTE — Patient Instructions (Signed)
Great to meet you!  The thing that worries me is possible developing pneumonia, the x ray will help [place] clear that up.   It is possible that post nasal drip is causing this, or GERD.   Start flonase, 2 sprays per nostril twice daily.   Please call or come back if you are having worsening symptoms or if you are not getting better within 1-2 weeks.

## 2016-03-13 DIAGNOSIS — R61 Generalized hyperhidrosis: Secondary | ICD-10-CM | POA: Diagnosis not present

## 2016-03-14 LAB — TESTOSTERONE: Testosterone: 351 ng/dL (ref 250–827)

## 2016-03-14 LAB — TSH: TSH: 1.27 m[IU]/L (ref 0.40–4.50)

## 2016-03-14 LAB — T4, FREE: Free T4: 1.6 ng/dL (ref 0.8–1.8)

## 2016-03-17 LAB — 5 HIAA W/CREATININE, 24 HR
5-HIAA W/CREATININE 24 HR UR: 2.5 mg/(24.h) (ref <=6.0)
Creatinine: 2.13 g/(24.h) (ref 0.63–2.50)
TOTAL VOLUME 5 HIAA W/ CREATININE: 1750 mL

## 2016-03-21 DIAGNOSIS — M25561 Pain in right knee: Secondary | ICD-10-CM | POA: Diagnosis not present

## 2016-03-21 DIAGNOSIS — M1711 Unilateral primary osteoarthritis, right knee: Secondary | ICD-10-CM | POA: Diagnosis not present

## 2016-03-21 DIAGNOSIS — M25562 Pain in left knee: Secondary | ICD-10-CM | POA: Diagnosis not present

## 2016-03-22 DIAGNOSIS — H5202 Hypermetropia, left eye: Secondary | ICD-10-CM | POA: Diagnosis not present

## 2016-03-22 DIAGNOSIS — H524 Presbyopia: Secondary | ICD-10-CM | POA: Diagnosis not present

## 2016-03-22 DIAGNOSIS — H52223 Regular astigmatism, bilateral: Secondary | ICD-10-CM | POA: Diagnosis not present

## 2016-03-23 ENCOUNTER — Encounter: Payer: Self-pay | Admitting: "Endocrinology

## 2016-03-23 ENCOUNTER — Ambulatory Visit (INDEPENDENT_AMBULATORY_CARE_PROVIDER_SITE_OTHER): Payer: BLUE CROSS/BLUE SHIELD | Admitting: "Endocrinology

## 2016-03-23 VITALS — BP 136/76 | HR 64 | Ht 71.0 in | Wt 237.0 lb

## 2016-03-23 DIAGNOSIS — E034 Atrophy of thyroid (acquired): Secondary | ICD-10-CM | POA: Diagnosis not present

## 2016-03-23 DIAGNOSIS — E038 Other specified hypothyroidism: Secondary | ICD-10-CM

## 2016-03-23 DIAGNOSIS — R61 Generalized hyperhidrosis: Secondary | ICD-10-CM | POA: Diagnosis not present

## 2016-03-23 NOTE — Progress Notes (Signed)
Subjective:    Patient ID: Troy Acosta, male    DOB: 1951/01/28, PCP Redge Gainer, MD   Past Medical History  Diagnosis Date  . Diaphragmatic hernia without mention of obstruction or gangrene   . Unspecified hypothyroidism   . Malaise and fatigue   . Arteritis, unspecified ([place])   . Iron deficiency anemia, unspecified   . Obesity   . Hyperplasia of prostate   . Essential hypertension, benign   . Other and unspecified hyperlipidemia   . Paroxysmal atrial fibrillation [facility])    Past Surgical History  Procedure Laterality Date  . Electrophysiologic study N/A 12/24/2015    Procedure: Atrial Fibrillation Ablation;  Surgeon: [name], MD;  Location: [place] [facility];  Service: Cardiovascular;  Laterality: N/A;   Social History   Social History  . Marital Status: Married    Spouse Name: N/A  . Number of Children: N/A  . Years of Education: 12   Occupational History  . electrician    Social History Main Topics  . Smoking status: Former Smoker    Types: Cigarettes    Quit date: 12/06/1992  . Smokeless tobacco: Never Used  . Alcohol Use: No  . Drug Use: No  . Sexual Activity:    Partners: Female   Other Topics Concern  . None   Social History Narrative   Outpatient Encounter Prescriptions as of 03/23/2016  Medication Sig  . Cholecalciferol (VITAMIN D3) 1000 UNITS CAPS Take 1,000 Units by mouth at bedtime.   Marland Kitchen FERREX 150 150 MG capsule TAKE ONE CAPSULE BY MOUTH ONE TIME DAILY (Patient taking differently: TAKE 150 MG BY MOUTH every evening)  . hydrochlorothiazide (HYDRODIURIL) 25 MG tablet TAKE ONE TABLET BY MOUTH ONE  TIME DAILY (Patient taking differently: TAKE 25 MG BY MOUTH ONE  TIME DAILY)  . levothyroxine (SYNTHROID, LEVOTHROID) 150 MCG tablet Take 1 tablet (150 mcg total) by mouth daily before breakfast.  . LORazepam (ATIVAN) 0.5 MG tablet Take 1/2 to 1 whole tab daily PRN  . metoprolol tartrate (LOPRESSOR) 25 MG tablet Take 0.5 tablets (12.5 mg total) by  mouth every 12 (twelve) hours. May take extra half tablet as needed if HR greater than 100.  . olmesartan (BENICAR) 20 MG tablet Take 20 mg by mouth daily.  . rivaroxaban (XARELTO) 20 MG TABS tablet Take 1 tablet (20 mg total) by mouth daily with supper.  . rosuvastatin (CRESTOR) 5 MG tablet Take 1 tablet (5 mg total) by mouth daily. (Patient taking differently: Take 5 mg by mouth at bedtime. )   No facility-administered encounter medications on file as of 03/23/2016.   ALLERGIES: No Known Allergies VACCINATION STATUS: Immunization History  Administered Date(s) Administered  . Influenza Split 07/29/2013  . Influenza Whole 07/09/2010  . Influenza-Unspecified 07/30/2014, 07/22/2015  . Td 03/10/2003  . Zoster 05/20/2013    HPI  65 year old gentleman with medical history as above. He is being seen in f/u for progressively worsening Diaphoresis. -He was kept on a lower dose of levothyroxine last visit at 150 g by mouth every morning. He has noticed significant improvement in the diaphoresis and he feels better.  He has history of mild bronchitis however he never was diagnosed with COPD. He denies any personal or family history of pituitary, adrenal function. He denies wheezing/diarrhea.  -He denies fainting, pounding headaches, nor palpitations. -On further interview he reports that he has very low libido. He denies any history of injury to his testicles, radiation or surgery in the groin.  Review of Systems Constitutional: no weight gain/loss, no fatigue,  +subjective hyperthermia Eyes: no blurry vision, no xerophthalmia ENT: no sore throat, no nodules palpated in throat, no dysphagia/odynophagia, no hoarseness Cardiovascular: no CP/SOB/palpitations/leg swelling Respiratory: no cough/SOB Gastrointestinal: no N/V/D/C Musculoskeletal: no muscle/joint aches Skin: no rashes, + excessive sweating associated with hot flushes. Neurological: no  tremors/numbness/tingling/dizziness Psychiatric: no depression/anxiety  Objective:    BP 136/76 mmHg  Pulse 64  Ht '5\' 11"'  (1.803 m)  Wt 237 lb (107.502 kg)  BMI 33.07 kg/m2  SpO2 95%  Wt Readings from Last 3 Encounters:  03/23/16 237 lb (107.502 kg)  03/07/16 238 lb 12.8 oz (108.319 kg)  03/02/16 240 lb (108.863 kg)    Physical Exam Constitutional: overweight, in NAD Eyes: PERRLA, EOMI, no exophthalmos ENT: moist mucous membranes, no thyromegaly, no cervical lymphadenopathy Cardiovascular: RRR, No MRG Respiratory: CTA B Gastrointestinal: abdomen soft, NT, ND, BS+ Musculoskeletal: no deformities, strength intact in all 4 Skin: moist, warm, no rashes, sun tanning on noncovered skin Genitourinary: His external genital exam is normal including normal size testes bilaterally. Neurological: no tremor with outstretched hands, DTR normal in all 4   Recent Results (from the past 2160 hour(s))  BMP8+EGFR     Status: Abnormal   Collection Time: 01/18/16  3:23 PM  Result Value Ref Range   Glucose 91 65 - 99 mg/dL   BUN 16 8 - 27 mg/dL   Creatinine, Ser 0.96 0.76 - 1.27 mg/dL   GFR calc non Af Amer 84 >59 mL/min/1.73   GFR calc Af Amer 98 >59 mL/min/1.73   BUN/Creatinine Ratio 17 10 - 24   Sodium 140 134 - 144 mmol/L   Potassium 3.7 3.5 - 5.2 mmol/L   Chloride 95 (L) 96 - 106 mmol/L   CO2 28 18 - 29 mmol/L   Calcium 9.2 8.6 - 10.2 mg/dL  CBC with Differential/Platelet     Status: None   Collection Time: 01/18/16  3:23 PM  Result Value Ref Range   WBC 9.3 3.4 - 10.8 x10E3/uL   RBC 4.98 4.14 - 5.80 x10E6/uL   Hemoglobin 14.7 12.6 - 17.7 g/dL   Hematocrit 43.8 37.5 - 51.0 %   MCV 88 79 - 97 fL   MCH 29.5 26.6 - 33.0 pg   MCHC 33.6 31.5 - 35.7 g/dL   RDW 13.2 12.3 - 15.4 %   Platelets 209 150 - 379 x10E3/uL   Neutrophils 67 %   Lymphs 23 %   Monocytes 9 %   Eos 1 %   Basos 0 %   Neutrophils Absolute 6.3 1.4 - 7.0 x10E3/uL   Lymphocytes Absolute 2.1 0.7 - 3.1 x10E3/uL    Monocytes Absolute 0.8 0.1 - 0.9 x10E3/uL   EOS (ABSOLUTE) 0.1 0.0 - 0.4 x10E3/uL   Basophils Absolute 0.0 0.0 - 0.2 x10E3/uL   Immature Granulocytes 0 %   Immature Grans (Abs) 0.0 0.0 - 0.1 x10E3/uL  Hepatic function panel     Status: None   Collection Time: 01/18/16  3:23 PM  Result Value Ref Range   Total Protein 7.1 6.0 - 8.5 g/dL   Albumin 4.2 3.6 - 4.8 g/dL   Bilirubin Total 0.6 0.0 - 1.2 mg/dL   Bilirubin, Direct 0.17 0.00 - 0.40 mg/dL   Alkaline Phosphatase 75 39 - 117 IU/L   AST 22 0 - 40 IU/L   ALT 22 0 - 44 IU/L  Sedimentation rate     Status: None   Collection Time: 01/18/16  3:23  PM  Result Value Ref Range   Sed Rate 2 0 - 30 mm/hr  Thyroid Panel With TSH     Status: None   Collection Time: 01/18/16  3:23 PM  Result Value Ref Range   TSH 2.200 0.450 - 4.500 uIU/mL   T4, Total 11.7 4.5 - 12.0 ug/dL   T3 Uptake Ratio 29 24 - 39 %   Free Thyroxine Index 3.4 1.2 - 4.9  5 HIAA w/Creatinine, 24 hr.     Status: None   Collection Time: 03/13/16  8:25 AM  Result Value Ref Range   Total Volume 1750 mL   5-HIAA 2.5 <=6.0 mg/24 h    Comment: This test was developed and its analytical performance characteristics have been determined by [place], [place]. It has not been cleared or approved by the U.S. Food and Drug Administration. This assay has been validated pursuant to the CLIA regulations and is used for clinical purposes.    Creatinine 2.13 0.63 - 2.50 g/24 h  TSH     Status: None   Collection Time: 03/13/16  8:25 AM  Result Value Ref Range   TSH 1.27 0.40 - 4.50 mIU/L  T4, free     Status: None   Collection Time: 03/13/16  8:25 AM  Result Value Ref Range   Free T4 1.6 0.8 - 1.8 ng/dL  Testosterone     Status: None   Collection Time: 03/13/16  8:25 AM  Result Value Ref Range   Testosterone 351 250 - 827 ng/dL    Comment: Men with clinically significant hypogonadal symptoms and testosterone values repeatedly less than  approximately 300 ng/dL may benefit from testosterone treatment after adequate risk and benefits counseling.      Assessment & Plan:   1. diaphoresis 2. Hypothyroidism  -I have reviewed his available records and evaluated patient clinically. - etiology for diaphoresis  seems to be secondary to  iatrogenic thyrotoxicosis. - He does not have hypogonadism nor carcinoid syndrome. - Urine, mind and metanephrine levels where not determined. Suspicion for pheochromocytoma is very low. If symptoms relapse or do not improve, this test would be completed on subsequent visits.   -, I advised him to continue  Synthroid 150 g by mouth every morning.  - We discussed about correct intake of levothyroxine, at fasting, with water, separated by at least 30 minutes from breakfast, and separated by more than 4 hours from calcium, iron, multivitamins, acid reflux medications (PPIs). -Patient is made aware of the fact that thyroid hormone replacement is needed for life, dose to be adjusted by periodic monitoring of thyroid function tests.  - I advised patient to maintain close follow up with Redge Gainer, MD for primary care needs, and to cardiologist for his cardiac care needs.  Follow up plan: Return in about 6 months (around 09/22/2016) for underactive thyroid.  Glade Lloyd, MD Phone: [phone]  Fax: [phone]   03/23/2016, 3:16 PM

## 2016-03-29 ENCOUNTER — Encounter: Payer: Self-pay | Admitting: Internal Medicine

## 2016-03-29 ENCOUNTER — Ambulatory Visit (INDEPENDENT_AMBULATORY_CARE_PROVIDER_SITE_OTHER): Payer: BLUE CROSS/BLUE SHIELD | Admitting: Internal Medicine

## 2016-03-29 VITALS — BP 138/90 | HR 54 | Ht 71.0 in | Wt 235.4 lb

## 2016-03-29 DIAGNOSIS — I480 Paroxysmal atrial fibrillation: Secondary | ICD-10-CM

## 2016-03-29 DIAGNOSIS — I429 Cardiomyopathy, unspecified: Secondary | ICD-10-CM | POA: Diagnosis not present

## 2016-03-29 DIAGNOSIS — I428 Other cardiomyopathies: Secondary | ICD-10-CM

## 2016-03-29 NOTE — Progress Notes (Signed)
Electrophysiology Office Note   Date:  03/29/2016   ID:  Troy Acosta, DOB [birth year], MRN [id number]  PCP:  Redge Gainer, MD  Cardiologist:  Dr Debara Pickett Primary Electrophysiologist: [name], MD    Chief Complaint  Patient presents with  . Atrial Fibrillation     History of Present Illness: Troy Acosta is a 65 y.o. male who presents today for electrophysiology evaluation.   He has done very well since his afib ablation.  Denies procedure related complications.  No further afib.  Today, he denies symptoms of chest pain, shortness of breath, orthopnea, PND, lower extremity edema, claudication, dizziness, presyncope, syncope, bleeding, or neurologic sequela. The patient is tolerating medications without difficulties and is otherwise without complaint today.    Past Medical History  Diagnosis Date  . Diaphragmatic hernia without mention of obstruction or gangrene   . Unspecified hypothyroidism   . Malaise and fatigue   . Arteritis, unspecified ([place])   . Iron deficiency anemia, unspecified   . Obesity   . Hyperplasia of prostate   . Essential hypertension, benign   . Other and unspecified hyperlipidemia   . Paroxysmal atrial fibrillation [facility])    Past Surgical History  Procedure Laterality Date  . Electrophysiologic study N/A 12/24/2015    Afib ablation by Dr Rayann Heman     Current Outpatient Prescriptions  Medication Sig Dispense Refill  . Cholecalciferol (VITAMIN D3) 1000 UNITS CAPS Take 1,000 Units by mouth at bedtime.     Marland Kitchen FERREX 150 150 MG capsule TAKE ONE CAPSULE BY MOUTH ONE TIME DAILY 90 capsule 1  . hydrochlorothiazide (HYDRODIURIL) 25 MG tablet TAKE ONE TABLET BY MOUTH ONE  TIME DAILY 90 tablet 1  . levothyroxine (SYNTHROID, LEVOTHROID) 150 MCG tablet Take 1 tablet (150 mcg total) by mouth daily before breakfast. 90 tablet 3  . LORazepam (ATIVAN) 0.5 MG tablet Take 0.5 mg by mouth daily.    Marland Kitchen olmesartan (BENICAR) 20 MG tablet Take 20 mg by mouth daily.    .  rivaroxaban (XARELTO) 20 MG TABS tablet Take 1 tablet (20 mg total) by mouth daily with supper. 90 tablet 3  . rosuvastatin (CRESTOR) 5 MG tablet Take 1 tablet (5 mg total) by mouth daily. 90 tablet 3   No current facility-administered medications for this visit.    Allergies:   Review of patient's allergies indicates no known allergies.   Social History:  The patient  reports that he quit smoking about 23 years ago. His smoking use included Cigarettes. He has never used smokeless tobacco. He reports that he does not drink alcohol or use illicit drugs.   Family History:  The patient's  family history includes COPD in his father; Cancer in his brother; Crohn's disease in his sister; Diabetes in his mother; Heart disease in his sister; Heart failure in his mother; Hypertension in his mother and sister; Lung cancer in his father; Thyroid disease in his sister; Tuberous sclerosis in his father.    ROS:  Please see the history of present illness.   All other systems are reviewed and negative.    PHYSICAL EXAM: VS:  BP 138/90 mmHg  Pulse 54  Ht 5\' 11"  (1.803 m)  Wt 235 lb 6.4 oz (106.777 kg)  BMI 32.85 kg/m2 , BMI Body mass index is 32.85 kg/(m^2). GEN: Well nourished, well developed, in no acute distress HEENT: normal Neck: no JVD, carotid bruits, or masses Cardiac: RRR; no murmurs, rubs, or gallops,no edema  Respiratory:  clear to auscultation bilaterally, normal  work of breathing GI: soft, nontender, nondistended, + BS MS: no deformity or atrophy Skin: warm and dry  Neuro:  Strength and sensation are intact Psych: euthymic mood, full affect  EKG:  EKG today reveals sinus rhythm 54 bpm   Recent Labs: 12/08/2015: Hemoglobin 15.9 01/18/2016: ALT 22; BUN 16; Creatinine, Ser 0.96; Platelets 209; Potassium 3.7; Sodium 140 03/13/2016: TSH 1.27    Lipid Panel     Component Value Date/Time   CHOL 133 12/24/2014 1710   TRIG 139 12/24/2014 1710   HDL 39* 12/24/2014 1710     Wt  Readings from Last 3 Encounters:  03/29/16 235 lb 6.4 oz (106.777 kg)  03/23/16 237 lb (107.502 kg)  03/07/16 238 lb 12.8 oz (108.319 kg)    ASSESSMENT AND PLAN:  1.  Paroxysmal atrial fibrillation Doing well s/p ablation off of AAD therapy chads2vasc score is 2.  Continue xarelto.  If EF recovers may be able to consider stopping xarelto in the future (will repeat echo prior to next visit) Stop metoprolol due to bradycardia with occasional fatigue  2. HTN Stable No change required today  3. Nonischemic CM Likely tachycardia mediated I am optimistic that EF will improve with sinus rhythm.  Repeat echo prior to next visit If EF recovers, could consider stopping anticoaguation  Current medicines are reviewed at length with the patient today.   The patient does not have concerns regarding his medicines.  The following changes were made today:  none  Return to see me in 3 months  Signed, [name], MD  03/29/2016 4:59 PM     [name] [place] [address] [phone] (office) [phone] (fax)

## 2016-03-29 NOTE — Patient Instructions (Addendum)
Medication Instructions:  Your physician recommends that you continue on your current medications as directed. Please refer to the Current Medication list given to you today.   Labwork: None ordered   Testing/Procedures: Your physician has requested that you have an echocardiogram. Echocardiography is a painless test that uses sound waves to create images of your heart. It provides your doctor with information about the size and shape of your heart and how well your heart's chambers and valves are working. This procedure takes approximately one hour. There are no restrictions for this procedure.---prior to apt with Dr Allred(do echo in [place])    Follow-Up: Your physician recommends that you schedule a follow-up appointment in: 3 months with Dr Rayann Heman   Any Other Special Instructions Will Be Listed Below (If Applicable).     If you need a refill on your cardiac medications before your next appointment, please call your pharmacy.

## 2016-03-30 DIAGNOSIS — M25562 Pain in left knee: Secondary | ICD-10-CM | POA: Diagnosis not present

## 2016-04-03 ENCOUNTER — Telehealth: Payer: Self-pay | Admitting: Family Medicine

## 2016-04-03 MED ORDER — LORAZEPAM 0.5 MG PO TABS: 0.5000 mg | ORAL_TABLET | Freq: Every day | ORAL | Status: DC

## 2016-04-03 NOTE — Telephone Encounter (Signed)
Troy Acosta, it doesn't look like we have ever filled here?

## 2016-04-12 ENCOUNTER — Telehealth: Payer: Self-pay

## 2016-04-12 ENCOUNTER — Ambulatory Visit (INDEPENDENT_AMBULATORY_CARE_PROVIDER_SITE_OTHER): Payer: BLUE CROSS/BLUE SHIELD | Admitting: Family Medicine

## 2016-04-12 ENCOUNTER — Encounter: Payer: Self-pay | Admitting: Family Medicine

## 2016-04-12 VITALS — BP 136/92 | HR 64 | Temp 96.8°F | Ht 71.0 in | Wt 234.0 lb

## 2016-04-12 DIAGNOSIS — M545 Low back pain, unspecified: Secondary | ICD-10-CM

## 2016-04-12 DIAGNOSIS — R61 Generalized hyperhidrosis: Secondary | ICD-10-CM | POA: Diagnosis not present

## 2016-04-12 DIAGNOSIS — D509 Iron deficiency anemia, unspecified: Secondary | ICD-10-CM | POA: Diagnosis not present

## 2016-04-12 DIAGNOSIS — I480 Paroxysmal atrial fibrillation: Secondary | ICD-10-CM | POA: Diagnosis not present

## 2016-04-12 LAB — URINALYSIS, COMPLETE
Bilirubin, UA: NEGATIVE
GLUCOSE, UA: NEGATIVE
KETONES UA: NEGATIVE
LEUKOCYTES UA: NEGATIVE
NITRITE UA: NEGATIVE
Protein, UA: NEGATIVE
SPEC GRAV UA: 1.015 (ref 1.005–1.030)
Urobilinogen, Ur: 0.2 mg/dL (ref 0.2–1.0)
pH, UA: 5 (ref 5.0–7.5)

## 2016-04-12 LAB — MICROSCOPIC EXAMINATION
Bacteria, UA: NONE SEEN
WBC, UA: NONE SEEN /hpf (ref 0–?)

## 2016-04-12 NOTE — Telephone Encounter (Signed)
Yes, also repeat TSH and free t4.

## 2016-04-12 NOTE — Progress Notes (Signed)
Subjective:    Patient ID: Troy Acosta, male    DOB: 1951/08/07, 65 y.o.   MRN: [id number]  HPI Patient here today for right lower back pain. The patient's thyroid issues have improved but he is beginning to have some increase sweating again and he will make an appointment for another month or so out to visit with the endocrinologist again and maybe get repeat blood work at that time. The back pain is most likely related to an pending knee replacement on the right. The patient describes wearing tennis shoes at work and then wearing his heavier shoes. When he wears the heavier shoes he has less back pain. He is most likely been favoring his right knee that is going to be replaced. He is on Xarelto. He does not describe any injury to his back. He does not describe any problems with his GI tract or passing his water. He is not having any problems with his chest including chest pain or shortness of breath.     Patient Active Problem List   Diagnosis Date Noted  . Other specified hypothyroidism 03/02/2016  . A-fib ([place]) 12/24/2015  . Non-ischemic cardiomyopathy ([place]) 11/07/2015  . Long term (current) use of anticoagulants 11/07/2015  . Diaphoresis 11/01/2015  . Chest pain 11/01/2015  . PAF (paroxysmal atrial fibrillation) ([place]) 07/15/2015  . Heart palpitations 07/11/2015  . Dyspnea 02/18/2015  . Anemia, iron deficiency 09/10/2013  . Diaphragmatic hernia without mention of obstruction or gangrene   . Hypothyroidism   . Malaise and fatigue   . Arteritis, unspecified ([place])   . Iron deficiency anemia, unspecified   . Obesity   . BPH (benign prostatic hyperplasia)   . Essential hypertension, benign   . Hyperlipidemia    Outpatient Encounter Prescriptions as of 04/12/2016  Medication Sig  . Cholecalciferol (VITAMIN D3) 1000 UNITS CAPS Take 1,000 Units by mouth at bedtime.   Marland Kitchen FERREX 150 150 MG capsule TAKE ONE CAPSULE BY MOUTH ONE TIME DAILY  . hydrochlorothiazide (HYDRODIURIL) 25 MG tablet  TAKE ONE TABLET BY MOUTH ONE  TIME DAILY  . levothyroxine (SYNTHROID, LEVOTHROID) 150 MCG tablet Take 1 tablet (150 mcg total) by mouth daily before breakfast.  . LORazepam (ATIVAN) 0.5 MG tablet Take 1 tablet (0.5 mg total) by mouth daily.  Marland Kitchen olmesartan (BENICAR) 20 MG tablet Take 20 mg by mouth daily.  . rivaroxaban (XARELTO) 20 MG TABS tablet Take 1 tablet (20 mg total) by mouth daily with supper.  . rosuvastatin (CRESTOR) 5 MG tablet Take 1 tablet (5 mg total) by mouth daily.   No facility-administered encounter medications on file as of 04/12/2016.      Review of Systems  Constitutional: Negative.   HENT: Negative.   Eyes: Negative.   Respiratory: Negative.   Cardiovascular: Negative.   Gastrointestinal: Negative.   Endocrine: Negative.   Genitourinary: Negative.   Musculoskeletal: Positive for back pain (right lower ).  Skin: Negative.   Allergic/Immunologic: Negative.   Neurological: Negative.   Hematological: Negative.   Psychiatric/Behavioral: Negative.        Objective:   Physical Exam  Constitutional: He is oriented to person, place, and time. He appears well-developed and well-nourished.  HENT:  Head: Normocephalic and atraumatic.  Mouth/Throat: No oropharyngeal exudate.  Eyes: Conjunctivae and EOM are normal. Pupils are equal, round, and reactive to light. Right eye exhibits no discharge. Left eye exhibits no discharge. No scleral icterus.  Neck: Normal range of motion. Neck supple. No thyromegaly present.  Cardiovascular: Normal  rate, regular rhythm and normal heart sounds.   No murmur heard. Pulmonary/Chest: Effort normal and breath sounds normal. No respiratory distress. He has no wheezes. He has no rales. He exhibits no tenderness.  Clear anteriorly and posteriorly  Abdominal: Soft. Bowel sounds are normal. He exhibits no mass. There is no tenderness. There is no rebound and no guarding.  No abdominal tenderness or organ enlargement or masses    Musculoskeletal: Normal range of motion. He exhibits no edema or tenderness.  Good leg raising bilaterally.. No tenderness with palpation of the back. Good hip abduction without back pain.  Lymphadenopathy:    He has no cervical adenopathy.  Neurological: He is alert and oriented to person, place, and time. He has normal reflexes. No cranial nerve deficit.  Skin: Skin is warm and dry. No rash noted.  Psychiatric: He has a normal mood and affect. His behavior is normal. Judgment and thought content normal.  Nursing note and vitals reviewed.  BP 136/92 mmHg  Pulse 64  Temp(Src) 96.8 F (36 C) (Oral)  Ht 5\' 11"  (1.803 m)  Wt 234 lb (106.142 kg)  BMI 32.65 kg/m2        Assessment & Plan:  1. Right-sided low back pain without sciatica -Urinalysis today -This is most likely related to favoring the knee on the right side with pending knee replacement surgery. -The patient will wear his heavier shoes since this seems to make a difference with his back pain -He will discuss this issue further with his orthopedic surgeon -He will take extra strength Tylenol if needed for pain  2. Excessive sweating -Reschedule visit with endocrinologist in 4-5 weeks  3. Anemia, iron deficiency -Continue iron replacement  4. Paroxysmal atrial fibrillation ([facility]) -The patient was in normal sinus rhythm today and he will have follow-up visit with cardiologist in about 3 months.  Patient Instructions  Take extra strength Tylenol as needed for pain Wear heavier shoes Follow-up with orthopedist as planned Check with [place] regarding Xarelto intake prior to knee surgery Follow-up with cardiology as planned Scheduled visit with endocrinology in 4-5 weeks to follow-up on the sweating issues   Arrie Senate MD

## 2016-04-12 NOTE — Telephone Encounter (Signed)
Pt states that he is not feeling well again and is requesting to come in. Does he need to have the 24 hr urine done that was not performed before his last visit.

## 2016-04-12 NOTE — Addendum Note (Signed)
Addended by: Zannie Cove on: 04/12/2016 09:10 AM   Modules accepted: Orders

## 2016-04-12 NOTE — Patient Instructions (Addendum)
Take extra strength Tylenol as needed for pain Wear heavier shoes Follow-up with orthopedist as planned Check with [place] regarding Xarelto intake prior to knee surgery Follow-up with cardiology as planned Scheduled visit with endocrinology in 4-5 weeks to follow-up on the sweating issues

## 2016-04-13 ENCOUNTER — Other Ambulatory Visit: Payer: Self-pay | Admitting: "Endocrinology

## 2016-04-13 DIAGNOSIS — R61 Generalized hyperhidrosis: Secondary | ICD-10-CM

## 2016-04-13 DIAGNOSIS — E038 Other specified hypothyroidism: Secondary | ICD-10-CM

## 2016-04-13 DIAGNOSIS — M25562 Pain in left knee: Secondary | ICD-10-CM | POA: Diagnosis not present

## 2016-04-13 LAB — URINE CULTURE: Organism ID, Bacteria: NO GROWTH

## 2016-04-13 NOTE — Telephone Encounter (Signed)
Pt.notified

## 2016-04-14 ENCOUNTER — Telehealth: Payer: Self-pay | Admitting: Internal Medicine

## 2016-04-14 NOTE — Telephone Encounter (Signed)
1. Type of surgery: right knee: right knee scope, KA-debridement/shaving (chondroplasty) 2. Date of surgery: "in the near future" 3. Surgeon: Dr. Esmond Plants 4. Medications that need to be held & how long: none specified - patient takes xarelto 5. Fax and/or Phone: (f) [phone]  (p) [phone]  -- Santiago Bur (surgical scheduler)

## 2016-04-17 ENCOUNTER — Other Ambulatory Visit: Payer: Self-pay | Admitting: "Endocrinology

## 2016-04-17 DIAGNOSIS — E038 Other specified hypothyroidism: Secondary | ICD-10-CM | POA: Diagnosis not present

## 2016-04-17 DIAGNOSIS — E034 Atrophy of thyroid (acquired): Secondary | ICD-10-CM | POA: Diagnosis not present

## 2016-04-17 DIAGNOSIS — I10 Essential (primary) hypertension: Secondary | ICD-10-CM | POA: Diagnosis not present

## 2016-04-17 DIAGNOSIS — R61 Generalized hyperhidrosis: Secondary | ICD-10-CM | POA: Diagnosis not present

## 2016-04-17 LAB — TSH: TSH: 1.73 mIU/L (ref 0.40–4.50)

## 2016-04-17 LAB — T4, FREE: Free T4: 1.4 ng/dL (ref 0.8–1.8)

## 2016-04-17 NOTE — Telephone Encounter (Signed)
Clearance routed via EPIC 

## 2016-04-17 NOTE — Telephone Encounter (Signed)
Ok to hold xarelto for 2 days prior to surgery. Low risk -cleared.  Dr. Lemmie Evens

## 2016-04-23 LAB — METANEPHRINES, URINE, 24 HOUR
Metaneph Total, Ur: 405 mcg/24 h (ref 224–832)
Metanephrines, Ur: 182 mcg/24 h (ref 90–315)
Normetanephrine, 24H Ur: 223 mcg/24 h (ref 122–676)

## 2016-04-25 ENCOUNTER — Telehealth: Payer: Self-pay | Admitting: Family Medicine

## 2016-04-25 LAB — CATECHOLAMINES, FRACTIONATED, URINE, 24 HOUR
CALCULATED TOTAL (E+ NE): 26 ug/(24.h) (ref 26–121)
Creatinine, Urine mg/day-CATEUR: 1.94 g/(24.h) (ref 0.63–2.50)
DOPAMINE, 24 HR URINE: 273 ug/(24.h) (ref 52–480)
Norepinephrine, 24 hr Ur: 26 mcg/24 h (ref 15–100)
Total Volume - CF 24Hr U: 2000 mL

## 2016-04-25 NOTE — Telephone Encounter (Signed)
Patient scheduled an appointment for 7:45 with Laurance Flatten on 04/28/16.

## 2016-04-28 ENCOUNTER — Ambulatory Visit (INDEPENDENT_AMBULATORY_CARE_PROVIDER_SITE_OTHER): Payer: BLUE CROSS/BLUE SHIELD

## 2016-04-28 ENCOUNTER — Encounter: Payer: Self-pay | Admitting: Family Medicine

## 2016-04-28 ENCOUNTER — Ambulatory Visit (INDEPENDENT_AMBULATORY_CARE_PROVIDER_SITE_OTHER): Payer: BLUE CROSS/BLUE SHIELD | Admitting: Family Medicine

## 2016-04-28 VITALS — BP 138/88 | HR 65 | Temp 97.8°F | Ht 71.0 in | Wt 235.0 lb

## 2016-04-28 DIAGNOSIS — M545 Low back pain, unspecified: Secondary | ICD-10-CM

## 2016-04-28 NOTE — Progress Notes (Signed)
Subjective:    Patient ID: Troy Acosta, male    DOB: 24-Dec-1950, 65 y.o.   MRN: [id number]  HPI Patient here today for follow up on low back pain. He states it is about the same.The patient continues to have this pain. He is definitely worse when he has been on his feet at work on a Immunologist. When he is at home on the weekends and not on his feet the pain is diminished. Wearing the heavy shoes has made some difference but he continues to have problems with his back. He is not going to have knee replacement by the orthopedic surgeon he is going to have arthroscopic knee surgery and this is to be done on the right knee first followed by the left knee about 7 weeks later. The orthopedist says he has bone-on-bone problems in the knee. And ultimately he may need a knee replacement down the road. He is doing well with his heart no problems with this. Patient denies any symptoms with voiding or bowel movements.     Patient Active Problem List   Diagnosis Date Noted  . Other specified hypothyroidism 03/02/2016  . A-fib ([place]) 12/24/2015  . Non-ischemic cardiomyopathy ([place]) 11/07/2015  . Long term (current) use of anticoagulants 11/07/2015  . Diaphoresis 11/01/2015  . Chest pain 11/01/2015  . PAF (paroxysmal atrial fibrillation) ([place]) 07/15/2015  . Heart palpitations 07/11/2015  . Dyspnea 02/18/2015  . Anemia, iron deficiency 09/10/2013  . Diaphragmatic hernia without mention of obstruction or gangrene   . Hypothyroidism   . Malaise and fatigue   . Arteritis, unspecified ([place])   . Iron deficiency anemia, unspecified   . Obesity   . BPH (benign prostatic hyperplasia)   . Essential hypertension, benign   . Hyperlipidemia    Outpatient Encounter Prescriptions as of 04/28/2016  Medication Sig  . Cholecalciferol (VITAMIN D3) 1000 UNITS CAPS Take 1,000 Units by mouth at bedtime.   Marland Kitchen FERREX 150 150 MG capsule TAKE ONE CAPSULE BY MOUTH ONE TIME DAILY  . hydrochlorothiazide (HYDRODIURIL) 25 MG  tablet TAKE ONE TABLET BY MOUTH ONE  TIME DAILY  . levothyroxine (SYNTHROID, LEVOTHROID) 150 MCG tablet Take 1 tablet (150 mcg total) by mouth daily before breakfast.  . LORazepam (ATIVAN) 0.5 MG tablet Take 1 tablet (0.5 mg total) by mouth daily.  Marland Kitchen olmesartan (BENICAR) 20 MG tablet Take 20 mg by mouth daily.  . rivaroxaban (XARELTO) 20 MG TABS tablet Take 1 tablet (20 mg total) by mouth daily with supper.  . rosuvastatin (CRESTOR) 5 MG tablet Take 1 tablet (5 mg total) by mouth daily.   No facility-administered encounter medications on file as of 04/28/2016.      Review of Systems  Constitutional: Negative.   HENT: Negative.   Eyes: Negative.   Respiratory: Negative.   Cardiovascular: Negative.   Gastrointestinal: Negative.   Endocrine: Negative.   Genitourinary: Negative.   Musculoskeletal: Positive for back pain (low).  Skin: Negative.   Allergic/Immunologic: Negative.   Neurological: Negative.   Hematological: Negative.   Psychiatric/Behavioral: Negative.        Objective:   Physical Exam  Constitutional: He is oriented to person, place, and time. He appears well-developed and well-nourished. No distress.  HENT:  Head: Normocephalic and atraumatic.  Mouth/Throat: No oropharyngeal exudate.  Eyes: Conjunctivae and EOM are normal. Pupils are equal, round, and reactive to light. Right eye exhibits no discharge. Left eye exhibits no discharge. No scleral icterus.  Neck: Normal range of motion. Neck supple.  Cardiovascular: Exam reveals no friction rub.   Pulmonary/Chest: Breath sounds normal.  Abdominal: He exhibits no mass.  Musculoskeletal: Normal range of motion. He exhibits tenderness. He exhibits no edema.  The patient has some slight pain in the right low back with leg raising. No pain with leg raising against pressure and no pain with hip abduction on either side.  Neurological: He is alert and oriented to person, place, and time. He has normal reflexes. No cranial  nerve deficit.  Skin: Skin is warm and dry. No rash noted.  Psychiatric: He has a normal mood and affect. His behavior is normal. Judgment and thought content normal.  Nursing note and vitals reviewed.  BP 138/88 mmHg  Pulse 65  Temp(Src) 97.8 F (36.6 C) (Oral)  Ht 5\' 11"  (1.803 m)  Wt 235 lb (106.595 kg)  BMI 32.79 kg/m2       Assessment & Plan:  1. 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

## 2016-04-28 NOTE — Patient Instructions (Addendum)
Continue to take acetaminophen, wear good support shoes at work, avoid heavy lifting, and use warm wet compresses when possible several times daily Please take a copy of the final report from your x-rays with you to see your orthopedic surgeon

## 2016-05-04 ENCOUNTER — Ambulatory Visit (INDEPENDENT_AMBULATORY_CARE_PROVIDER_SITE_OTHER): Payer: BLUE CROSS/BLUE SHIELD | Admitting: "Endocrinology

## 2016-05-04 ENCOUNTER — Encounter: Payer: Self-pay | Admitting: "Endocrinology

## 2016-05-04 VITALS — BP 129/79 | HR 67 | Ht 71.0 in | Wt 235.0 lb

## 2016-05-04 DIAGNOSIS — E038 Other specified hypothyroidism: Secondary | ICD-10-CM | POA: Diagnosis not present

## 2016-05-04 DIAGNOSIS — R61 Generalized hyperhidrosis: Secondary | ICD-10-CM

## 2016-05-04 NOTE — Progress Notes (Signed)
Subjective:    Patient ID: Troy Acosta, male    DOB: 1950-11-07, PCP Redge Gainer, MD   Past Medical History:  Diagnosis Date  . Arteritis, unspecified ([place])   . Diaphragmatic hernia without mention of obstruction or gangrene   . Essential hypertension, benign   . Hyperplasia of prostate   . Iron deficiency anemia, unspecified   . Malaise and fatigue   . Obesity   . Other and unspecified hyperlipidemia   . Paroxysmal atrial fibrillation ([facility])   . Unspecified hypothyroidism    Past Surgical History:  Procedure Laterality Date  . ELECTROPHYSIOLOGIC STUDY N/A 12/24/2015   Afib ablation by Dr Rayann Heman   Social History   Social History  . Marital status: Married    Spouse name: N/A  . Number of children: N/A  . Years of education: 43   Occupational History  . electrician    Social History Main Topics  . Smoking status: Former Smoker    Types: Cigarettes    Quit date: 12/06/1992  . Smokeless tobacco: Never Used  . Alcohol use No  . Drug use: No  . Sexual activity: Yes    Partners: Female   Other Topics Concern  . None   Social History Narrative  . None   Outpatient Encounter Prescriptions as of 05/04/2016  Medication Sig  . Cholecalciferol (VITAMIN D3) 1000 UNITS CAPS Take 1,000 Units by mouth at bedtime.   Marland Kitchen FERREX 150 150 MG capsule TAKE ONE CAPSULE BY MOUTH ONE TIME DAILY  . hydrochlorothiazide (HYDRODIURIL) 25 MG tablet TAKE ONE TABLET BY MOUTH ONE  TIME DAILY  . levothyroxine (SYNTHROID, LEVOTHROID) 150 MCG tablet Take 1 tablet (150 mcg total) by mouth daily before breakfast.  . LORazepam (ATIVAN) 0.5 MG tablet Take 1 tablet (0.5 mg total) by mouth daily.  Marland Kitchen olmesartan (BENICAR) 20 MG tablet Take 20 mg by mouth daily.  . rivaroxaban (XARELTO) 20 MG TABS tablet Take 1 tablet (20 mg total) by mouth daily with supper.  . rosuvastatin (CRESTOR) 5 MG tablet Take 1 tablet (5 mg total) by mouth daily.   No facility-administered encounter medications on file as of  05/04/2016.    ALLERGIES: No Known Allergies VACCINATION STATUS: Immunization History  Administered Date(s) Administered  . Influenza Split 07/29/2013  . Influenza Whole 07/09/2010  . Influenza-Unspecified 07/30/2014, 07/22/2015  . Td 03/10/2003  . Zoster 05/20/2013    HPI  65 year old gentleman with medical history as above. He is being seen in f/u for progressively worsening Diaphoresis. -He was kept on a lower dose of levothyroxine last visit at 150 g by mouth every morning. He has noticed significant improvement in the diaphoresis and he feels better. - He still has intermittent unpredictable diaphoresis associated with anxiety and palpitations. -He has completed his 24 hour urine studies, see below.  He has history of mild bronchitis however he never was diagnosed with COPD. He denies any personal or family history of pituitary, adrenal function. He denies wheezing/diarrhea.  -He denies fainting, pounding headaches, nor palpitations. -On further interview he reports that he has very low libido. He denies any history of injury to his testicles, radiation or surgery in the groin.   Review of Systems Constitutional: no weight gain/loss, + fatigue/sleepiness  +subjective hyperthermia Eyes: no blurry vision, no xerophthalmia ENT: no sore throat, no nodules palpated in throat, no dysphagia/odynophagia, no hoarseness Cardiovascular: no CP/SOB/palpitations/leg swelling Respiratory: no cough/SOB Gastrointestinal: no N/V/D/C Musculoskeletal: no muscle/joint aches Skin: no rashes, + excessive sweating associated  with hot flushes. Neurological: no tremors/numbness/tingling/dizziness Psychiatric: no depression/anxiety  Objective:    BP 129/79   Pulse 67   Ht 5\' 11"  (1.803 m)   Wt 235 lb (106.6 kg)   BMI 32.78 kg/m   Wt Readings from Last 3 Encounters:  05/04/16 235 lb (106.6 kg)  04/28/16 235 lb (106.6 kg)  04/12/16 234 lb (106.1 kg)    Physical Exam Constitutional:  overweight, in NAD Eyes: PERRLA, EOMI, no exophthalmos ENT: moist mucous membranes, no thyromegaly, no cervical lymphadenopathy Cardiovascular: RRR, No MRG Respiratory: CTA B Gastrointestinal: abdomen soft, NT, ND, BS+ Musculoskeletal: no deformities, strength intact in all 4 Skin: moist, warm, no rashes, sun tanning on noncovered skin Genitourinary: His external genital exam is normal including normal size testes bilaterally. Neurological: no tremor with outstretched hands, DTR normal in all 4   Recent Results (from the past 2160 hour(s))  5 HIAA w/Creatinine, 24 hr.     Status: None   Collection Time: 03/13/16  8:25 AM  Result Value Ref Range   Total Volume 1,750 mL   5-HIAA 2.5 <=6.0 mg/24 h    Comment: This test was developed and its analytical performance characteristics have been determined by [place], [place]. It has not been cleared or approved by the U.S. Food and Drug Administration. This assay has been validated pursuant to the CLIA regulations and is used for clinical purposes.    Creatinine 2.13 0.63 - 2.50 g/24 h  TSH     Status: None   Collection Time: 03/13/16  8:25 AM  Result Value Ref Range   TSH 1.27 0.40 - 4.50 mIU/L  T4, free     Status: None   Collection Time: 03/13/16  8:25 AM  Result Value Ref Range   Free T4 1.6 0.8 - 1.8 ng/dL  Testosterone     Status: None   Collection Time: 03/13/16  8:25 AM  Result Value Ref Range   Testosterone 351 250 - 827 ng/dL    Comment: Men with clinically significant hypogonadal symptoms and testosterone values repeatedly less than approximately 300 ng/dL may benefit from testosterone treatment after adequate risk and benefits counseling.   Urinalysis, Complete     Status: Abnormal   Collection Time: 04/12/16  9:16 AM  Result Value Ref Range   Specific Gravity, UA 1.015 1.005 - 1.030   pH, UA 5.0 5.0 - 7.5   Color, UA Yellow Yellow   Appearance Ur Clear Clear   Leukocytes, UA  Negative Negative   Protein, UA Negative Negative/Trace   Glucose, UA Negative Negative   Ketones, UA Negative Negative   RBC, UA Trace (A) Negative   Bilirubin, UA Negative Negative   Urobilinogen, Ur 0.2 0.2 - 1.0 mg/dL   Nitrite, UA Negative Negative   Microscopic Examination See below:   Urine culture     Status: None   Collection Time: 04/12/16  9:16 AM  Result Value Ref Range   Urine Culture, Routine Final report    Urine Culture result 1 No growth   Microscopic Examination     Status: None   Collection Time: 04/12/16  9:16 AM  Result Value Ref Range   WBC, UA None seen 0 - 5 /hpf   RBC, UA 0-2 0 - 2 /hpf   Epithelial Cells (non renal) 0-10 0 - 10 /hpf   Bacteria, UA None seen None seen/Few  TSH     Status: None   Collection Time: 04/17/16  8:25 AM  Result Value Ref Range   TSH 1.73 0.40 - 4.50 mIU/L  T4, free     Status: None   Collection Time: 04/17/16  8:25 AM  Result Value Ref Range   Free T4 1.4 0.8 - 1.8 ng/dL  Catecholamines, fractionated, urine, 24 hour     Status: None   Collection Time: 04/17/16  9:12 AM  Result Value Ref Range   Total Volume - CF 24Hr U 2,000 mL   Epinephrine, 24 hr Urine REPORT     Comment: Results are below the reportable range for this analyte, which is 2.0 mcg/L. This test was developed and its analytical performance characteristics have been determined by [place], [place]. It has not been cleared or approved by the U.S. Food and Drug Administration. This assay has been validated pursuant to the CLIA regulations and is used for clinical purposes.    Norepinephrine, 24 hr Ur 26 15 - 100 mcg/24 h    Comment: This test was developed and its analytical performance characteristics have been determined by [place], [place]. It has not been cleared or approved by the U.S. Food and Drug Administration. This assay has been validated pursuant to the CLIA regulations and is  used for clinical purposes.    Calculated Total (E+NE) 26 26 - 121 mcg/24 h    Comment: This test was developed and its analytical performance characteristics have been determined by [place], [place]. It has not been cleared or approved by the U.S. Food and Drug Administration. This assay has been validated pursuant to the CLIA regulations and is used for clinical purposes.    Dopamine, 24 hr Urine 273 52 - 480 mcg/24 h    Comment: This test was developed and its analytical performance characteristics have been determined by [place], [place]. It has not been cleared or approved by the U.S. Food and Drug Administration. This assay has been validated pursuant to the CLIA regulations and is used for clinical purposes.    Creatinine, Urine mg/day-CATEUR 1.94 0.63 - 2.50 g/24 h  Metanephrines, urine, 24 hour     Status: None   Collection Time: 04/17/16  9:18 AM  Result Value Ref Range   Metanephrines, Ur 182 90 - 315 mcg/24 h    Comment: This test was developed and its analytical performance characteristics have been determined by [place], [place]. It has not been cleared or approved by the U.S. Food and Drug Administration. This assay has been validated pursuant to the CLIA regulations and is used for clinical purposes.    Normetanephrine, 24H Ur 223 122 - 676 mcg/24 h    Comment: This test was developed and its analytical performance characteristics have been determined by [place], [place]. It has not been cleared or approved by the U.S. Food and Drug Administration. This assay has been validated pursuant to the CLIA regulations and is used for clinical purposes.    Metaneph Total, Ur 405 224 - 832 mcg/24 h    Comment: A four fold elevation of urinary normetanephrines is extremely likely to be due to a tumor, while a four fold elevation of  urinary metanephrines is highly suggestive, but not diagnostic of the tumor. Measurement of plasma Metanephrines and Chromogranin A is recommended for confirmation.      Assessment & Plan:   1. Diaphoresis 2. Hypothyroidism  -I have reviewed his available records and evaluated patient clinically.  -  He does not have hypogonadism nor carcinoid syndrome. - Urine, mind and metanephrine levels are now determined.  pheochromocytoma  Is ruled out .  -  His thyroid function tests are consistent with appropriate replacement with resolution of iatrogenic thyrotoxicosis he has had prior to his last visit. -  I advised him to continue  Synthroid 150 g by mouth every morning.  - We discussed about correct intake of levothyroxine, at fasting, with water, separated by at least 30 minutes from breakfast, and separated by more than 4 hours from calcium, iron, multivitamins, acid reflux medications (PPIs). -Patient is made aware of the fact that thyroid hormone replacement is needed for life, dose to be adjusted by periodic monitoring of thyroid function tests.  - I suspect generalized anxiety  Disorder, he may benefit from psych evaluation.  - I advised patient to maintain close follow up with Redge Gainer, MD for primary care needs, and to cardiologist for his cardiac care needs.  Follow up plan: Return in about 6 months (around 11/04/2016) for follow up with pre-visit labs.  Glade Lloyd, MD Phone: [phone]  Fax: [phone]   05/04/2016, 4:03 PM

## 2016-05-17 ENCOUNTER — Ambulatory Visit: Payer: BLUE CROSS/BLUE SHIELD | Admitting: Family Medicine

## 2016-05-17 DIAGNOSIS — M23361 Other meniscus derangements, other lateral meniscus, right knee: Secondary | ICD-10-CM | POA: Diagnosis not present

## 2016-05-17 DIAGNOSIS — M23331 Other meniscus derangements, other medial meniscus, right knee: Secondary | ICD-10-CM | POA: Diagnosis not present

## 2016-05-17 DIAGNOSIS — M6751 Plica syndrome, right knee: Secondary | ICD-10-CM | POA: Diagnosis not present

## 2016-05-17 DIAGNOSIS — G8918 Other acute postprocedural pain: Secondary | ICD-10-CM | POA: Diagnosis not present

## 2016-05-17 DIAGNOSIS — S83231A Complex tear of medial meniscus, current injury, right knee, initial encounter: Secondary | ICD-10-CM | POA: Diagnosis not present

## 2016-05-17 DIAGNOSIS — M94261 Chondromalacia, right knee: Secondary | ICD-10-CM | POA: Diagnosis not present

## 2016-05-17 DIAGNOSIS — S83261A Peripheral tear of lateral meniscus, current injury, right knee, initial encounter: Secondary | ICD-10-CM | POA: Diagnosis not present

## 2016-06-13 ENCOUNTER — Telehealth: Payer: Self-pay | Admitting: Family Medicine

## 2016-06-14 DIAGNOSIS — D509 Iron deficiency anemia, unspecified: Secondary | ICD-10-CM | POA: Diagnosis not present

## 2016-06-14 DIAGNOSIS — E669 Obesity, unspecified: Secondary | ICD-10-CM | POA: Diagnosis not present

## 2016-06-14 DIAGNOSIS — E039 Hypothyroidism, unspecified: Secondary | ICD-10-CM | POA: Diagnosis not present

## 2016-06-14 DIAGNOSIS — E782 Mixed hyperlipidemia: Secondary | ICD-10-CM | POA: Diagnosis not present

## 2016-06-14 MED ORDER — LORAZEPAM 0.5 MG PO TABS: 0.5000 mg | ORAL_TABLET | Freq: Every day | ORAL | 1 refills | Status: DC

## 2016-06-14 NOTE — Telephone Encounter (Signed)
rf sent to pharm

## 2016-06-22 DIAGNOSIS — M25562 Pain in left knee: Secondary | ICD-10-CM | POA: Diagnosis not present

## 2016-06-26 ENCOUNTER — Telehealth: Payer: Self-pay | Admitting: Family Medicine

## 2016-06-26 NOTE — Telephone Encounter (Signed)
Appointment made to see Dr. Laurance Flatten at 1:45 on 09/20

## 2016-06-28 ENCOUNTER — Other Ambulatory Visit: Payer: Self-pay

## 2016-06-28 ENCOUNTER — Ambulatory Visit (INDEPENDENT_AMBULATORY_CARE_PROVIDER_SITE_OTHER): Payer: BLUE CROSS/BLUE SHIELD | Admitting: Family Medicine

## 2016-06-28 ENCOUNTER — Encounter: Payer: Self-pay | Admitting: Family Medicine

## 2016-06-28 ENCOUNTER — Ambulatory Visit (INDEPENDENT_AMBULATORY_CARE_PROVIDER_SITE_OTHER): Payer: BLUE CROSS/BLUE SHIELD

## 2016-06-28 VITALS — BP 122/78 | HR 86 | Temp 97.3°F | Ht 71.0 in | Wt 240.0 lb

## 2016-06-28 DIAGNOSIS — R61 Generalized hyperhidrosis: Secondary | ICD-10-CM | POA: Diagnosis not present

## 2016-06-28 DIAGNOSIS — I480 Paroxysmal atrial fibrillation: Secondary | ICD-10-CM

## 2016-06-28 DIAGNOSIS — F4321 Adjustment disorder with depressed mood: Secondary | ICD-10-CM | POA: Diagnosis not present

## 2016-06-28 DIAGNOSIS — R51 Headache: Secondary | ICD-10-CM | POA: Diagnosis not present

## 2016-06-28 DIAGNOSIS — R5383 Other fatigue: Secondary | ICD-10-CM | POA: Diagnosis not present

## 2016-06-28 DIAGNOSIS — D509 Iron deficiency anemia, unspecified: Secondary | ICD-10-CM

## 2016-06-28 DIAGNOSIS — R251 Tremor, unspecified: Secondary | ICD-10-CM | POA: Diagnosis not present

## 2016-06-28 DIAGNOSIS — R519 Headache, unspecified: Secondary | ICD-10-CM

## 2016-06-28 DIAGNOSIS — IMO0001 Reserved for inherently not codable concepts without codable children: Secondary | ICD-10-CM

## 2016-06-28 NOTE — Patient Instructions (Signed)
We will call with lab results have been returned Continue current treatment We will consider making a referral to the neurologist because of the headache and tremor We will consider also starting a mild antidepressant after the blood work is returned and before you see the neurologist

## 2016-06-28 NOTE — Progress Notes (Signed)
Subjective:    Patient ID: Troy Acosta, male    DOB: 08/13/1951, 65 y.o.   MRN: [id number]  HPI Patient here today for continued fatigue, sweats and now tremor of the right hand.The patient had echo cardiogram and EKG this morning by his cardiologist and we'll follow-up again with him with a direct visit and a couple weeks. He complains today of several things. He complains of a tremor in his right hand that he first noticed a couple weeks ago reading the newspaper. He also complained of trouble focusing at about the same time. He has fatigue feels tired and sweats a lot. The fatigue is all the time. He has a headache every morning and this is been going on for about 2 weeks and some frontal type headache. He takes Tylenol and it is relieved by the Tylenol and the headache comes back again around lunchtime and once again Tylenol always relieves it and it is there when he goes to bed at night but he doesn't take the Tylenol then. He is also had an emotional letdown recently and that his next door neighbor and brother-in-law died that he was very very close to. The patient has had recent arthroscopic knee surgery in the right knee and he is been at home close to his friend and brother-in-law all during the time he was dying and this is had a profound impact on him also. He says that his blood pressures are up and down. The initial blood pressure coming into the office today was 143/92. I repeated it with a manual cuff in the right arm and it was 122/78. The patient is on thyroid medicine and he is currently taking 150 g daily and was told by the endocrinologist back in the spring that his thyroid tests were perfect and he doesn't plan to see him again until December.     Patient Active Problem List   Diagnosis Date Noted  . Other specified hypothyroidism 03/02/2016  . A-fib ([place]) 12/24/2015  . Non-ischemic cardiomyopathy ([place]) 11/07/2015  . Long term (current) use of anticoagulants 11/07/2015  .  Diaphoresis 11/01/2015  . Chest pain 11/01/2015  . PAF (paroxysmal atrial fibrillation) ([place]) 07/15/2015  . Heart palpitations 07/11/2015  . Dyspnea 02/18/2015  . Anemia, iron deficiency 09/10/2013  . Diaphragmatic hernia without mention of obstruction or gangrene   . Hypothyroidism   . Malaise and fatigue   . Arteritis, unspecified ([place])   . Iron deficiency anemia, unspecified   . Obesity   . BPH (benign prostatic hyperplasia)   . Essential hypertension, benign   . Hyperlipidemia    Outpatient Encounter Prescriptions as of 06/28/2016  Medication Sig  . Cholecalciferol (VITAMIN D3) 1000 UNITS CAPS Take 1,000 Units by mouth at bedtime.   Marland Kitchen FERREX 150 150 MG capsule TAKE ONE CAPSULE BY MOUTH ONE TIME DAILY  . hydrochlorothiazide (HYDRODIURIL) 25 MG tablet TAKE ONE TABLET BY MOUTH ONE  TIME DAILY  . levothyroxine (SYNTHROID, LEVOTHROID) 150 MCG tablet Take 1 tablet (150 mcg total) by mouth daily before breakfast.  . LORazepam (ATIVAN) 0.5 MG tablet Take 1 tablet (0.5 mg total) by mouth daily.  Marland Kitchen olmesartan (BENICAR) 20 MG tablet Take 20 mg by mouth daily.  . rivaroxaban (XARELTO) 20 MG TABS tablet Take 1 tablet (20 mg total) by mouth daily with supper.  . rosuvastatin (CRESTOR) 5 MG tablet Take 1 tablet (5 mg total) by mouth daily.   No facility-administered encounter medications on file as of 06/28/2016.  Review of Systems  Constitutional: Positive for fatigue.  Eyes: Negative.   Respiratory: Negative.   Cardiovascular: Negative.   Gastrointestinal: Negative.   Endocrine: Negative.        Sweating  Genitourinary: Negative.   Musculoskeletal: Negative.   Skin: Negative.   Allergic/Immunologic: Negative.   Neurological: Positive for headaches.  Hematological: Negative.   Psychiatric/Behavioral: Negative.        Trouble focusing       Objective:   Physical Exam  Constitutional: He is oriented to person, place, and time. He appears well-developed and well-nourished.  He appears distressed.  The patient is pleasant and alert and somewhat stressed by feeling bad and not having an answer to why he is feeling bad.  HENT:  Head: Normocephalic and atraumatic.  Right Ear: External ear normal.  Left Ear: External ear normal.  Nose: Nose normal.  Mouth/Throat: Oropharynx is clear and moist. No oropharyngeal exudate.  Eyes: Conjunctivae and EOM are normal. Pupils are equal, round, and reactive to light. Right eye exhibits no discharge. Left eye exhibits no discharge. No scleral icterus.  Neck: Normal range of motion. Neck supple. No thyromegaly present.  Cardiovascular: Normal rate, regular rhythm, normal heart sounds and intact distal pulses.  Exam reveals no gallop and no friction rub.   No murmur heard. Pulmonary/Chest: Effort normal and breath sounds normal. No respiratory distress. He has no wheezes. He has no rales. He exhibits no tenderness.  Abdominal: Soft. Bowel sounds are normal. He exhibits no mass. There is no tenderness. There is no rebound and no guarding.  Genitourinary: Rectum normal, prostate normal and penis normal.  Musculoskeletal: Normal range of motion. He exhibits no edema or tenderness.  Lymphadenopathy:    He has no cervical adenopathy.  Neurological: He is alert and oriented to person, place, and time. He has normal reflexes. No cranial nerve deficit.  Skin: Skin is warm and dry. No rash noted. No erythema. No pallor.  Psychiatric: He has a normal mood and affect. His behavior is normal. Judgment and thought content normal.  Nursing note and vitals reviewed.  BP (!) 143/92 (BP Location: Right Arm)   Pulse 86   Temp 97.3 F (36.3 C) (Oral)   Ht _0  (1.803 m)   Wt 240 lb (108.9 kg)   BMI 33.47 kg/m   Repeat blood pressure 122/78 right arm sitting      Assessment & Plan:  1. Other fatigue -The cyst been going on for a good while - BMP8+EGFR - Thyroid Panel With TSH - CBC with Differential/Platelet - Vitamin B12  2.  Sweating -The sweating is also being going on for a good while - BMP8+EGFR - Thyroid Panel With TSH - CBC with Differential/Platelet  3. Adjustment disorder with depressed mood -The patient is somewhat down psychologically because of the recent death of his close friend and brother-in-law.  4. Tremor of right hand -This is been going on for a couple of weeks.  5. Frontal headache -The frontal headache is been going on for couple weeks. He is had his eye exam in the past several months and he received a new prescription for his eyes.  6. Anemia, iron deficiency -This is been an ongoing problem for years and his hemoglobin has been stable at 14 with taking iron.  7. Paroxysmal atrial fibrillation ([place]) -He continues to be followed by the cardiologist.  Patient Instructions  We will call with lab results have been returned Continue current treatment We will consider making a  referral to the neurologist because of the headache and tremor We will consider also starting a mild antidepressant after the blood work is returned and before you see the neurologist  Arrie Senate MD

## 2016-06-29 LAB — BMP8+EGFR
BUN/Creatinine Ratio: 23 (ref 10–24)
BUN: 22 mg/dL (ref 8–27)
CALCIUM: 9.3 mg/dL (ref 8.6–10.2)
CO2: 28 mmol/L (ref 18–29)
CREATININE: 0.96 mg/dL (ref 0.76–1.27)
Chloride: 99 mmol/L (ref 96–106)
GFR, EST AFRICAN AMERICAN: 98 mL/min/{1.73_m2} (ref >59)
GFR, EST NON AFRICAN AMERICAN: 84 mL/min/{1.73_m2} (ref >59)
Glucose: 119 mg/dL — ABNORMAL HIGH (ref 65–99)
POTASSIUM: 3.6 mmol/L (ref 3.5–5.2)
Sodium: 143 mmol/L (ref 134–144)

## 2016-06-29 LAB — CBC WITH DIFFERENTIAL/PLATELET
BASOS ABS: 0 10*3/uL (ref 0.0–0.2)
BASOS: 0 %
EOS (ABSOLUTE): 0.1 10*3/uL (ref 0.0–0.4)
Eos: 1 %
Hematocrit: 45.2 % (ref 37.5–51.0)
Hemoglobin: 15.2 g/dL (ref 12.6–17.7)
IMMATURE GRANULOCYTES: 0 %
Immature Grans (Abs): 0 10*3/uL (ref 0.0–0.1)
Lymphocytes Absolute: 2.4 10*3/uL (ref 0.7–3.1)
Lymphs: 20 %
MCH: 29.2 pg (ref 26.6–33.0)
MCHC: 33.6 g/dL (ref 31.5–35.7)
MCV: 87 fL (ref 79–97)
MONOS ABS: 1 10*3/uL — AB (ref 0.1–0.9)
Monocytes: 8 %
NEUTROS PCT: 71 %
Neutrophils Absolute: 8.7 10*3/uL — ABNORMAL HIGH (ref 1.4–7.0)
PLATELETS: 218 10*3/uL (ref 150–379)
RBC: 5.21 x10E6/uL (ref 4.14–5.80)
RDW: 14 % (ref 12.3–15.4)
WBC: 12.3 10*3/uL — ABNORMAL HIGH (ref 3.4–10.8)

## 2016-06-29 LAB — VITAMIN B12: VITAMIN B 12: 246 pg/mL (ref 211–946)

## 2016-06-29 LAB — THYROID PANEL WITH TSH
FREE THYROXINE INDEX: 2.1 (ref 1.2–4.9)
T3 UPTAKE RATIO: 28 % (ref 24–39)
T4 TOTAL: 7.6 ug/dL (ref 4.5–12.0)
TSH: 7.33 u[IU]/mL — ABNORMAL HIGH (ref 0.450–4.500)

## 2016-06-30 ENCOUNTER — Other Ambulatory Visit: Payer: Self-pay | Admitting: *Deleted

## 2016-06-30 ENCOUNTER — Telehealth: Payer: Self-pay | Admitting: Family Medicine

## 2016-06-30 DIAGNOSIS — E538 Deficiency of other specified B group vitamins: Secondary | ICD-10-CM

## 2016-06-30 DIAGNOSIS — R7309 Other abnormal glucose: Secondary | ICD-10-CM

## 2016-06-30 MED ORDER — LEVOTHYROXINE SODIUM 25 MCG PO TABS: 12.5000 ug | ORAL_TABLET | Freq: Every day | ORAL | 0 refills | Status: DC

## 2016-06-30 NOTE — Telephone Encounter (Signed)
Patient aware of results and medication dosage

## 2016-06-30 NOTE — Progress Notes (Signed)
Thank you both for forwarding results.  Will keep you posted

## 2016-07-03 DIAGNOSIS — I480 Paroxysmal atrial fibrillation: Secondary | ICD-10-CM | POA: Diagnosis not present

## 2016-07-03 DIAGNOSIS — E039 Hypothyroidism, unspecified: Secondary | ICD-10-CM | POA: Diagnosis not present

## 2016-07-03 DIAGNOSIS — E538 Deficiency of other specified B group vitamins: Secondary | ICD-10-CM | POA: Diagnosis not present

## 2016-07-03 LAB — HGB A1C W/O EAG: Hgb A1c MFr Bld: 5.4 % (ref 4.8–5.6)

## 2016-07-03 LAB — SPECIMEN STATUS REPORT

## 2016-07-04 ENCOUNTER — Other Ambulatory Visit: Payer: Self-pay | Admitting: Family Medicine

## 2016-07-04 DIAGNOSIS — I480 Paroxysmal atrial fibrillation: Secondary | ICD-10-CM

## 2016-07-05 DIAGNOSIS — E782 Mixed hyperlipidemia: Secondary | ICD-10-CM | POA: Diagnosis not present

## 2016-07-05 DIAGNOSIS — D509 Iron deficiency anemia, unspecified: Secondary | ICD-10-CM | POA: Diagnosis not present

## 2016-07-05 DIAGNOSIS — Z139 Encounter for screening, unspecified: Secondary | ICD-10-CM | POA: Diagnosis not present

## 2016-07-05 DIAGNOSIS — E039 Hypothyroidism, unspecified: Secondary | ICD-10-CM | POA: Diagnosis not present

## 2016-07-05 DIAGNOSIS — Z79899 Other long term (current) drug therapy: Secondary | ICD-10-CM | POA: Diagnosis not present

## 2016-07-05 DIAGNOSIS — E559 Vitamin D deficiency, unspecified: Secondary | ICD-10-CM | POA: Diagnosis not present

## 2016-07-07 ENCOUNTER — Encounter: Payer: Self-pay | Admitting: Internal Medicine

## 2016-07-07 ENCOUNTER — Ambulatory Visit (INDEPENDENT_AMBULATORY_CARE_PROVIDER_SITE_OTHER): Payer: BLUE CROSS/BLUE SHIELD | Admitting: Internal Medicine

## 2016-07-07 VITALS — BP 130/78 | HR 61 | Ht 71.0 in | Wt 241.0 lb

## 2016-07-07 DIAGNOSIS — I10 Essential (primary) hypertension: Secondary | ICD-10-CM | POA: Diagnosis not present

## 2016-07-07 DIAGNOSIS — I480 Paroxysmal atrial fibrillation: Secondary | ICD-10-CM | POA: Diagnosis not present

## 2016-07-07 MED ORDER — METOPROLOL TARTRATE 25 MG PO TABS: 25.0000 mg | ORAL_TABLET | Freq: Four times a day (QID) | ORAL | 6 refills | Status: DC | PRN

## 2016-07-07 MED ORDER — HYDROCHLOROTHIAZIDE 12.5 MG PO TABS: 12.5000 mg | ORAL_TABLET | Freq: Every day | ORAL | 3 refills | Status: DC

## 2016-07-07 MED ORDER — OLMESARTAN MEDOXOMIL 40 MG PO TABS: 40.0000 mg | ORAL_TABLET | Freq: Every day | ORAL | 3 refills | Status: DC

## 2016-07-07 NOTE — Patient Instructions (Signed)
Medication Instructions:   Stop Xarelto.  Increase Benicar to 40mg  daily.  Decrease HCTZ to 12.5mg  daily.  Begin Metoprolol tart 25mg  - one tab every 6 hours as needed for palpitations.    Continue all other medications.    Labwork: none  Testing/Procedures: none  Follow-Up: 3 months   Any Other Special Instructions Will Be Listed Below (If Applicable).  If you need a refill on your cardiac medications before your next appointment, please call your pharmacy.

## 2016-07-07 NOTE — Progress Notes (Signed)
Electrophysiology Office Note   Date:  07/07/2016   ID:  Troy Acosta, DOB 01-28-1951, MRN [id number]  PCP:  Redge Gainer, MD  Cardiologist:  Dr Debara Pickett Primary Electrophysiologist: [name], MD    No chief complaint on file.    History of Present Illness: Troy Acosta is a 65 y.o. male who presents today for electrophysiology evaluation.   No afib since ablation.  EF has recovered.  Today, he denies symptoms of chest pain, shortness of breath, orthopnea, PND, lower extremity edema, claudication, dizziness, presyncope, syncope, bleeding, or neurologic sequela. The patient is tolerating medications without difficulties and is otherwise without complaint today.    Past Medical History:  Diagnosis Date  . Arteritis, unspecified ([place])   . Diaphragmatic hernia without mention of obstruction or gangrene   . Essential hypertension, benign   . Hyperplasia of prostate   . Iron deficiency anemia, unspecified   . Malaise and fatigue   . Obesity   . Other and unspecified hyperlipidemia   . Paroxysmal atrial fibrillation (HCC)   . Unspecified hypothyroidism    Past Surgical History:  Procedure Laterality Date  . ELECTROPHYSIOLOGIC STUDY N/A 12/24/2015   Afib ablation by Dr Rayann Heman     Current Outpatient Prescriptions  Medication Sig Dispense Refill  . Cholecalciferol (VITAMIN D3) 1000 UNITS CAPS Take 1,000 Units by mouth at bedtime.     Marland Kitchen FERREX 150 150 MG capsule TAKE ONE CAPSULE BY MOUTH ONE TIME DAILY 90 capsule 1  . hydrochlorothiazide (HYDRODIURIL) 12.5 MG tablet Take 1 tablet (12.5 mg total) by mouth daily. 90 tablet 3  . levothyroxine (SYNTHROID, LEVOTHROID) 150 MCG tablet Take 1 tablet (150 mcg total) by mouth daily before breakfast. 90 tablet 3  . levothyroxine (SYNTHROID, LEVOTHROID) 25 MCG tablet Take 0.5 tablets (12.5 mcg total) by mouth daily before breakfast. 30 tablet 0  . LORazepam (ATIVAN) 0.5 MG tablet Take 1 tablet (0.5 mg total) by mouth daily. 30 tablet 1  .  olmesartan (BENICAR) 40 MG tablet Take 1 tablet (40 mg total) by mouth daily. 90 tablet 3  . rosuvastatin (CRESTOR) 5 MG tablet Take 1 tablet (5 mg total) by mouth daily. 90 tablet 3  . metoprolol tartrate (LOPRESSOR) 25 MG tablet Take 1 tablet (25 mg total) by mouth every 6 (six) hours as needed (palpitations). 30 tablet 6   No current facility-administered medications for this visit.     Allergies:   Review of patient's allergies indicates no known allergies.   Social History:  The patient  reports that he quit smoking about 23 years ago. His smoking use included Cigarettes. He has never used smokeless tobacco. He reports that he does not drink alcohol or use drugs.   Family History:  The patient's  family history includes COPD in his father; Cancer in his brother; Crohn's disease in his sister; Diabetes in his mother; Heart disease in his sister; Heart failure in his mother; Hypertension in his mother and sister; Lung cancer in his father; Thyroid disease in his sister; Tuberous sclerosis in his father.    ROS:  Please see the history of present illness.   All other systems are reviewed and negative.    PHYSICAL EXAM: VS:  BP 130/78   Pulse 61   Ht 5\' 11"  (1.803 m)   Wt 241 lb (109.3 kg)   SpO2 97%   BMI 33.61 kg/m  , BMI Body mass index is 33.61 kg/m. GEN: Well nourished, well developed, in no acute distress  HEENT:  normal  Neck: no JVD, carotid bruits, or masses Cardiac: RRR; no murmurs, rubs, or gallops,no edema  Respiratory:  clear to auscultation bilaterally, normal work of breathing GI: soft, nontender, nondistended, + BS MS: no deformity or atrophy  Skin: warm and dry  Neuro:  Strength and sensation are intact Psych: euthymic mood, full affect  EKG:  EKG today reveals sinus rhythm 54 bpm   Recent Labs: 12/08/2015: Hemoglobin 15.9 01/18/2016: ALT 22 06/28/2016: BUN 22; Creatinine, Ser 0.96; Platelets 218; Potassium 3.6; Sodium 143; TSH 7.330    Lipid Panel       Component Value Date/Time   CHOL 133 12/24/2014 1710   TRIG 139 12/24/2014 1710   HDL 39 (L) 12/24/2014 1710     Wt Readings from Last 3 Encounters:  07/07/16 241 lb (109.3 kg)  06/28/16 240 lb (108.9 kg)  05/04/16 235 lb (106.6 kg)    ASSESSMENT AND PLAN:  1.  Paroxysmal atrial fibrillation Doing well s/p ablation off of AAD therapy chads2vasc score is 1 (EF has recovered) Stop xarelto per patient preference  2. HTN Stable No change required today Reduce hctz to 12.5mg  daily due to occasional dizziness and sinus tachycardia  3. Nonischemic CM Resolved (tachy mediated)  Current medicines are reviewed at length with the patient today.   The patient does not have concerns regarding his medicines.  The following changes were made today:  none  Return to see me in 3 months  Signed, [name], MD  07/07/2016 1:39 PM     Troy Acosta [place] [address] [phone] (office) [phone] (fax)

## 2016-07-11 ENCOUNTER — Other Ambulatory Visit: Payer: Self-pay | Admitting: *Deleted

## 2016-07-11 MED ORDER — METOPROLOL TARTRATE 25 MG PO TABS: 25.0000 mg | ORAL_TABLET | Freq: Four times a day (QID) | ORAL | 3 refills | Status: DC | PRN

## 2016-07-12 ENCOUNTER — Telehealth: Payer: Self-pay | Admitting: *Deleted

## 2016-07-12 DIAGNOSIS — E782 Mixed hyperlipidemia: Secondary | ICD-10-CM | POA: Diagnosis not present

## 2016-07-12 DIAGNOSIS — E559 Vitamin D deficiency, unspecified: Secondary | ICD-10-CM | POA: Diagnosis not present

## 2016-07-12 DIAGNOSIS — D509 Iron deficiency anemia, unspecified: Secondary | ICD-10-CM | POA: Diagnosis not present

## 2016-07-12 DIAGNOSIS — E039 Hypothyroidism, unspecified: Secondary | ICD-10-CM | POA: Diagnosis not present

## 2016-07-12 NOTE — Telephone Encounter (Signed)
Error

## 2016-07-17 ENCOUNTER — Ambulatory Visit (INDEPENDENT_AMBULATORY_CARE_PROVIDER_SITE_OTHER): Payer: BLUE CROSS/BLUE SHIELD | Admitting: Family Medicine

## 2016-07-17 ENCOUNTER — Encounter: Payer: Self-pay | Admitting: Family Medicine

## 2016-07-17 VITALS — BP 152/90 | HR 61 | Temp 97.8°F | Ht 71.0 in | Wt 243.0 lb

## 2016-07-17 DIAGNOSIS — L749 Eccrine sweat disorder, unspecified: Secondary | ICD-10-CM | POA: Diagnosis not present

## 2016-07-17 DIAGNOSIS — R51 Headache: Secondary | ICD-10-CM

## 2016-07-17 DIAGNOSIS — I10 Essential (primary) hypertension: Secondary | ICD-10-CM

## 2016-07-17 DIAGNOSIS — R519 Headache, unspecified: Secondary | ICD-10-CM

## 2016-07-17 DIAGNOSIS — Z23 Encounter for immunization: Secondary | ICD-10-CM | POA: Diagnosis not present

## 2016-07-17 DIAGNOSIS — E034 Atrophy of thyroid (acquired): Secondary | ICD-10-CM

## 2016-07-17 DIAGNOSIS — I480 Paroxysmal atrial fibrillation: Secondary | ICD-10-CM

## 2016-07-17 DIAGNOSIS — E784 Other hyperlipidemia: Secondary | ICD-10-CM

## 2016-07-17 DIAGNOSIS — E7849 Other hyperlipidemia: Secondary | ICD-10-CM

## 2016-07-17 MED ORDER — LEVOTHYROXINE SODIUM 150 MCG PO TABS: 150.0000 ug | ORAL_TABLET | Freq: Every day | ORAL | 3 refills | Status: DC

## 2016-07-17 MED ORDER — POLYSACCHARIDE IRON COMPLEX 150 MG PO CAPS: 150.0000 mg | ORAL_CAPSULE | Freq: Every day | ORAL | 3 refills | Status: DC

## 2016-07-17 NOTE — Patient Instructions (Addendum)
Continue current medications. Continue good therapeutic lifestyle changes which include good diet and exercise. Fall precautions discussed with patient. If an FOBT was given today- please return it to our front desk. If you are over 63 years old - you may need Prevnar 30 or the adult Pneumonia vaccine.  **Flu shots are available--- please call and schedule a FLU-CLINIC appointment**  After your visit with [place] today you will receive a survey in the mail or online from [employer] regarding your care with [place]. Please take a moment to fill this out. Your feedback is very important to [place] as you can help [place] better understand your patient needs as well as improve your experience and satisfaction. WE CARE ABOUT YOU!!!   The flu shot you received today may make your arm sore. Make sure you get your thyroid profile repeated as planned around the middle of November make sure that we get a copy of that report Follow-up with cardiology as needed Monitor blood pressures and record these readings on the monitoring sheet that I gave you and take a copy of this with you when you go see the neurologist that we are planning to schedule you to see because of the headaches and the sweating and the thyroid issues that you have had. Continue to watch sodium intake and restrict caffeine to one cup of coffee every morning Continue medicines as currently doing

## 2016-07-17 NOTE — Progress Notes (Signed)
Subjective:    Patient ID: Troy Acosta, male    DOB: 10-Feb-1951, 65 y.o.   MRN: [id number]  HPI Pt here for follow up and management of chronic medical problems which includes hypothyroid, hyperlipidemia, and hypertension. He is taking medications regularly.The patient brings in lab work from [employer]. The only thing out of line was his TSH which was too high and we recently ask him to increase his thyroid medicine in conjunction with talking with his endocrinologist. All his other lab values were good. Hemoglobin was good and he was not anemic. The LDL C was good at 62. His hemoglobin was good and his PSA was 1.5. Liver function and most importantly creatinine were within normal limits. The patient is feeling some better since the last visit. We have adjusted up his thyroid medication and he will not be due to repeat this for another 4 weeks. His tremor is improved his focusing is better he still has some headache and his blood pressure continues to fluctuate some. He seems to be over or getting better with the stress of losing his friend. He says he wakes up in the morning with a headache he takes Tylenol and goes away. He has not had any trouble with his vision that he can document. His blood pressures at home consistently running in the 130s over the 70s. He denies any chest pain. He denies any problems with shortness of breath or his GI tract or trouble passing his water. He recently saw the cardiologist and they increased his Benicar to 40 and reduce his HCTZ to half of a 25 or 12.5 mg. They did tell him he could take the metoprolol as needed he's been taking a half of a 25 mg metoprolol in the mornings has a headaches.     Patient Active Problem List   Diagnosis Date Noted  . Other specified hypothyroidism 03/02/2016  . A-fib ([place]) 12/24/2015  . Non-ischemic cardiomyopathy ([place]) 11/07/2015  . Long term (current) use of anticoagulants 11/07/2015  . Diaphoresis 11/01/2015  . Chest pain  11/01/2015  . PAF (paroxysmal atrial fibrillation) ([place]) 07/15/2015  . Heart palpitations 07/11/2015  . Dyspnea 02/18/2015  . Anemia, iron deficiency 09/10/2013  . Diaphragmatic hernia without mention of obstruction or gangrene   . Hypothyroidism   . Malaise and fatigue   . Arteritis, unspecified   . Iron deficiency anemia, unspecified   . Obesity   . BPH (benign prostatic hyperplasia)   . Essential hypertension, benign   . Hyperlipidemia    Outpatient Encounter Prescriptions as of 07/17/2016  Medication Sig  . Cholecalciferol (VITAMIN D3) 1000 UNITS CAPS Take 1,000 Units by mouth at bedtime.   Marland Kitchen FERREX 150 150 MG capsule TAKE ONE CAPSULE BY MOUTH ONE TIME DAILY  . hydrochlorothiazide (HYDRODIURIL) 12.5 MG tablet Take 1 tablet (12.5 mg total) by mouth daily.  Marland Kitchen levothyroxine (SYNTHROID, LEVOTHROID) 150 MCG tablet Take 1 tablet (150 mcg total) by mouth daily before breakfast.  . levothyroxine (SYNTHROID, LEVOTHROID) 25 MCG tablet Take 0.5 tablets (12.5 mcg total) by mouth daily before breakfast.  . LORazepam (ATIVAN) 0.5 MG tablet Take 1 tablet (0.5 mg total) by mouth daily.  . metoprolol tartrate (LOPRESSOR) 25 MG tablet Take 1 tablet (25 mg total) by mouth every 6 (six) hours as needed (palpitations).  . olmesartan (BENICAR) 40 MG tablet Take 1 tablet (40 mg total) by mouth daily.  . rosuvastatin (CRESTOR) 5 MG tablet Take 1 tablet (5 mg total) by mouth daily.  No facility-administered encounter medications on file as of 07/17/2016.       Review of Systems  Constitutional: Negative.   Eyes: Negative.   Respiratory: Negative.   Cardiovascular: Negative.   Gastrointestinal: Negative.   Endocrine: Negative.        Still having sweats - better now  Genitourinary: Negative.   Musculoskeletal: Negative.   Skin: Negative.   Allergic/Immunologic: Negative.   Neurological: Positive for headaches.  Hematological: Negative.   Psychiatric/Behavioral: Negative.        Objective:     Physical Exam  Constitutional: He is oriented to person, place, and time. He appears well-developed and well-nourished. No distress.  The patient is alert and a good historian  HENT:  Head: Normocephalic and atraumatic.  Right Ear: External ear normal.  Left Ear: External ear normal.  Mouth/Throat: Oropharynx is clear and moist. No oropharyngeal exudate.  Slight nasal congestion left greater than right  Eyes: Conjunctivae and EOM are normal. Pupils are equal, round, and reactive to light. Right eye exhibits no discharge. Left eye exhibits no discharge. No scleral icterus.  Neck: Normal range of motion. Neck supple. No thyromegaly present.  No  thyromegaly or anterior cervical adenopathy  Cardiovascular: Normal rate, regular rhythm, normal heart sounds and intact distal pulses.   No murmur heard. The heart has a regular rate and rhythm at 60/m  Pulmonary/Chest: Effort normal and breath sounds normal. No respiratory distress. He has no wheezes. He has no rales. He exhibits no tenderness.  Abdominal: Soft. Bowel sounds are normal. He exhibits no mass. There is no tenderness. There is no rebound and no guarding.  Musculoskeletal: Normal range of motion. He exhibits no edema.  Lymphadenopathy:    He has no cervical adenopathy.  Neurological: He is alert and oriented to person, place, and time. He has normal reflexes. No cranial nerve deficit.  Skin: Skin is warm and dry. No rash noted.  Psychiatric: He has a normal mood and affect. His behavior is normal. Judgment and thought content normal.  Nursing note and vitals reviewed.  BP (!) 152/90 (BP Location: Left Arm)   Pulse 61   Temp 97.8 F (36.6 C) (Oral)   Ht 5\' 11"  (1.803 m)   Wt 243 lb (110.2 kg)   BMI 33.89 kg/m   Repeat blood pressure right arm sitting with large cuff manually was 152/90 also.      Assessment & Plan:  1. Paroxysmal atrial fibrillation (HCC) -The heart is regular. He is recently had an echocardiogram. The  cardiologist were pleased with his ejection fraction and function.  2. Other hyperlipidemia -He will continue with his Crestor. His most recent lab work values from work were good and these were reviewed today will be scanned into the record.  3. Essential hypertension, benign -His home blood pressure readings have been in the 130s over the 70s and he will continue with current treatment despite the elevated reading obtained in the office today.  4. Hypothyroidism due to acquired atrophy of thyroid -Continue with current treatment and recheck thyroid profile about the middle of November and adjust thyroid medicine as needed after that  5. Encounter for immunization - levothyroxine (SYNTHROID, LEVOTHROID) 150 MCG tablet; Take 1 tablet (150 mcg total) by mouth daily before breakfast.  Dispense: 90 tablet; Refill: 3 - iron polysaccharides (FERREX 150) 150 MG capsule; Take 1 capsule (150 mg total) by mouth daily.  Dispense: 90 capsule; Refill: 3 - Flu Vaccine QUAD 36+ mos IM  6. Nonintractable headache,  unspecified chronicity pattern, unspecified headache type - Ambulatory referral to Neurology  7. Headache above the eye region  8. Sweating abnormality -This is somewhat better since he since upping the thyroid medication and hopefully after being on it longer this will continue to improve.  Meds ordered this encounter  Medications  . levothyroxine (SYNTHROID, LEVOTHROID) 150 MCG tablet    Sig: Take 1 tablet (150 mcg total) by mouth daily before breakfast.    Dispense:  90 tablet    Refill:  3  . iron polysaccharides (FERREX 150) 150 MG capsule    Sig: Take 1 capsule (150 mg total) by mouth daily.    Dispense:  90 capsule    Refill:  3   Patient Instructions  Continue current medications. Continue good therapeutic lifestyle changes which include good diet and exercise. Fall precautions discussed with patient. If an FOBT was given today- please return it to our front desk. If you  are over 90 years old - you may need Prevnar 74 or the adult Pneumonia vaccine.  **Flu shots are available--- please call and schedule a FLU-CLINIC appointment**  After your visit with [place] today you will receive a survey in the mail or online from [employer] regarding your care with [place]. Please take a moment to fill this out. Your feedback is very important to [place] as you can help [place] better understand your patient needs as well as improve your experience and satisfaction. WE CARE ABOUT YOU!!!   The flu shot you received today may make your arm sore. Make sure you get your thyroid profile repeated as planned around the middle of November make sure that we get a copy of that report Follow-up with cardiology as needed Monitor blood pressures and record these readings on the monitoring sheet that I gave you and take a copy of this with you when you go see the neurologist that we are planning to schedule you to see because of the headaches and the sweating and the thyroid issues that you have had. Continue to watch sodium intake and restrict caffeine to one cup of coffee every morning Continue medicines as currently doing       Arrie Senate MD

## 2016-07-21 ENCOUNTER — Other Ambulatory Visit (INDEPENDENT_AMBULATORY_CARE_PROVIDER_SITE_OTHER): Payer: BLUE CROSS/BLUE SHIELD

## 2016-07-21 DIAGNOSIS — Z1212 Encounter for screening for malignant neoplasm of rectum: Principal | ICD-10-CM

## 2016-07-21 DIAGNOSIS — Z1211 Encounter for screening for malignant neoplasm of colon: Secondary | ICD-10-CM

## 2016-07-21 NOTE — Addendum Note (Signed)
Addended by: Pollyann Kennedy F on: 07/21/2016 02:48 PM   Modules accepted: Orders

## 2016-07-24 LAB — FECAL OCCULT BLOOD, IMMUNOCHEMICAL: FECAL OCCULT BLD: NEGATIVE

## 2016-08-09 ENCOUNTER — Encounter: Payer: Self-pay | Admitting: Neurology

## 2016-08-09 ENCOUNTER — Ambulatory Visit (INDEPENDENT_AMBULATORY_CARE_PROVIDER_SITE_OTHER): Payer: BLUE CROSS/BLUE SHIELD | Admitting: Neurology

## 2016-08-09 VITALS — BP 140/81 | HR 64 | Ht 71.0 in | Wt 244.0 lb

## 2016-08-09 DIAGNOSIS — R519 Headache, unspecified: Secondary | ICD-10-CM

## 2016-08-09 DIAGNOSIS — G8929 Other chronic pain: Secondary | ICD-10-CM | POA: Insufficient documentation

## 2016-08-09 DIAGNOSIS — R51 Headache: Secondary | ICD-10-CM | POA: Diagnosis not present

## 2016-08-09 MED ORDER — GABAPENTIN 100 MG PO CAPS: 100.0000 mg | ORAL_CAPSULE | Freq: Three times a day (TID) | ORAL | 2 refills | Status: DC

## 2016-08-09 NOTE — Progress Notes (Signed)
Reason for visit: Headache  Referring physician: Dr. Alfonse Acosta is a 65 y.o. male  History of present illness:  Troy Acosta is a 65 year old left-handed white male with a history of headaches that began about 2 months ago. The patient claims that he did not have headaches previously in his life. The headaches are not severe, they are on the top of the head, but they are daily in nature. The patient indicates that he generally will wake up with a headache associated with a dull achy pain. The headache will go away with Excedrin Migraine, but then may come back 5 or 6 hours later he may have to take 2 or 3 tablets a day to get rid of the headache. The patient drinks one cup of coffee a day, he does not drink any other caffeinated beverages during the day. The patient indicates that the headaches began around the time that his Benicar was increased from 20 mg to 40 mg day, his HCTZ was reduced from 25 mg to 12.5 mg and he was started on metoprolol taking half of a 25 mg tablet twice daily. The patient reports no nausea or vomiting, photophobia or phonophobia, or any confusion with the headache. The patient has not had any neck stiffness. He denies any sinus drainage to speak of. The patient indicates that the headaches never gets severe enough to prevent him from doing anything. He sleeps well at night, he does not snore. He is sent to this office for further evaluation.  Past Medical History:  Diagnosis Date  . Anxiety   . Arteritis, unspecified   . Diaphragmatic hernia without mention of obstruction or gangrene   . Essential hypertension, benign   . Hyperplasia of prostate   . Iron deficiency anemia, unspecified   . Malaise and fatigue   . Obesity   . Other and unspecified hyperlipidemia   . Paroxysmal atrial fibrillation (HCC)   . Unspecified hypothyroidism     Past Surgical History:  Procedure Laterality Date  . ELECTROPHYSIOLOGIC STUDY N/A 12/24/2015   Afib ablation by Dr Rayann Heman   . KNEE SURGERY Right     Family History  Problem Relation Age of Onset  . Heart failure Mother   . Hypertension Mother   . Diabetes Mother   . Lung cancer Father   . COPD Father   . Tuberous sclerosis Father   . Heart disease Sister   . Cancer Brother   . Hypertension Sister   . Thyroid disease Sister   . Crohn's disease Sister   . Lung cancer Paternal Grandfather     Social history:  reports that he quit smoking about 23 years ago. His smoking use included Cigarettes. He has never used smokeless tobacco. He reports that he does not drink alcohol or use drugs.  Medications:  Prior to Admission medications   Medication Sig Start Date End Date Taking? Authorizing Provider  aspirin-acetaminophen-caffeine (EXCEDRIN MIGRAINE) [phone] MG tablet Take 2 tablets by mouth every 8 (eight) hours as needed for headache.   Yes Historical Provider, MD  Cholecalciferol (VITAMIN D3) 1000 UNITS CAPS Take 1,000 Units by mouth at bedtime.    Yes Historical Provider, MD  hydrochlorothiazide (HYDRODIURIL) 12.5 MG tablet Take 1 tablet (12.5 mg total) by mouth daily. Patient taking differently: Take 25 mg by mouth daily.  07/07/16  Yes [name], MD  iron polysaccharides (FERREX 150) 150 MG capsule Take 1 capsule (150 mg total) by mouth daily. 07/17/16  Yes Elenore Rota  Jennette Bill, MD  levothyroxine (SYNTHROID, LEVOTHROID) 150 MCG tablet Take 1 tablet (150 mcg total) by mouth daily before breakfast. 07/17/16  Yes Chipper Herb, MD  levothyroxine (SYNTHROID, LEVOTHROID) 25 MCG tablet Take 0.5 tablets (12.5 mcg total) by mouth daily before breakfast. 06/30/16  Yes Chipper Herb, MD  LORazepam (ATIVAN) 0.5 MG tablet Take 1 tablet (0.5 mg total) by mouth daily. 06/14/16  Yes Chipper Herb, MD  metoprolol tartrate (LOPRESSOR) 25 MG tablet Take 1 tablet (25 mg total) by mouth every 6 (six) hours as needed (palpitations). Patient taking differently: Take 12.5 mg by mouth 2 (two) times daily.  07/11/16 10/09/16 Yes [name], MD  olmesartan (BENICAR) 40 MG tablet Take 1 tablet (40 mg total) by mouth daily. 07/07/16  Yes [name], MD  rosuvastatin (CRESTOR) 5 MG tablet Take 1 tablet (5 mg total) by mouth daily. 10/05/15  Yes Chipper Herb, MD     No Known Allergies  ROS:  Out of a complete 14 system review of symptoms, the patient complains only of the following symptoms, and all other reviewed systems are negative.  Fatigue Anemia, easy bruising, easy bleeding Feeling hot Joint pain, joint swelling Headache Anxiety, not enough sleep, decreased energy, racing thoughts Sleepiness  Blood pressure 140/81, pulse 64, height 5\' 11"  (1.803 m), weight 244 lb (110.7 kg).  Physical Exam  General: The patient is alert and cooperative at the time of the examination. The patient is moderately obese.  Eyes: Pupils are equal, round, and reactive to light. Discs are flat bilaterally.  Neck: The neck is supple, no carotid bruits are noted.  Respiratory: The respiratory examination is clear.  Cardiovascular: The cardiovascular examination reveals a regular rate and rhythm, no obvious murmurs or rubs are noted.   Neuromuscular: Range of movement of cervical spine is full.  Skin: Extremities are without significant edema.  Neurologic Exam  Mental status: The patient is alert and oriented x 3 at the time of the examination. The patient has apparent normal recent and remote memory, with an apparently normal attention span and concentration ability.  Cranial nerves: Facial symmetry is present. There is good sensation of the face to pinprick and soft touch bilaterally. The strength of the facial muscles and the muscles to head turning and shoulder shrug are normal bilaterally. Speech is well enunciated, no aphasia or dysarthria is noted. Extraocular movements are full. Visual fields are full. The tongue is midline, and the patient has symmetric elevation of the soft palate. No obvious hearing deficits are  noted.  Motor: The motor testing reveals 5 over 5 strength of all 4 extremities. Good symmetric motor tone is noted throughout.  Sensory: Sensory testing is intact to pinprick, soft touch, vibration sensation, and position sense on all 4 extremities. No evidence of extinction is noted.  Coordination: Cerebellar testing reveals good finger-nose-finger and heel-to-shin bilaterally.  Gait and station: Gait is normal. Tandem gait is normal. Romberg is negative. No drift is seen.  Reflexes: Deep tendon reflexes are symmetric and normal bilaterally. Toes are downgoing bilaterally.   Assessment/Plan:  1. Daily headache  The patient has begun having daily headaches that do not appear to be consistent with migraine. The patient had several medication adjustments around the time of onset of headache, it is not clear that this is the etiology, however. The patient will be set up for blood work today to include a sedimentation rate and C-reactive protein. He will have MRI evaluation of the brain. He  will be placed on low-dose gabapentin taking 100 mg 3 times daily. I will see him back in 3-4 months. We may make dose adjustments of medication if needed.  Jill Alexanders MD 08/09/2016 3:20 PM  [facility] [address] [place], [place] [address]  Phone [phone] Fax [phone]

## 2016-08-10 ENCOUNTER — Telehealth: Payer: Self-pay | Admitting: Family Medicine

## 2016-08-10 LAB — SEDIMENTATION RATE: Sed Rate: 6 mm/hr (ref 0–30)

## 2016-08-10 LAB — C-REACTIVE PROTEIN: CRP: 4.3 mg/L (ref 0.0–4.9)

## 2016-08-10 NOTE — Telephone Encounter (Signed)
Pt needs refill on Levothyroxine 24mcg And Lorazepam Please review and advise

## 2016-08-10 NOTE — Telephone Encounter (Signed)
Both of these are okay to refill

## 2016-08-11 ENCOUNTER — Other Ambulatory Visit: Payer: Self-pay | Admitting: Family Medicine

## 2016-08-11 NOTE — Telephone Encounter (Signed)
Last filled 07/12/16, last seen 07/17/16. Call in at [facility]

## 2016-08-14 ENCOUNTER — Telehealth: Payer: Self-pay | Admitting: Internal Medicine

## 2016-08-14 NOTE — Telephone Encounter (Signed)
Left message for patient that I was reading his Neuro note where he was seen for HA on 08/09/16.  It does not mention in the note anything about Xarelto.  Mentions other medications.  I have asked he get the MRI done that has been ordered by Neuro and keep his follow up as scheduled with Dr Rayann Heman.  Im not sure the discontinuing of Xarelto has a thing to do with his symptoms.  He can call back to the office if has more questions

## 2016-08-14 NOTE — Telephone Encounter (Signed)
New Message:    Pt have been having headaches every since he stopped taking Xarelto.Please call to advise

## 2016-08-15 ENCOUNTER — Telehealth: Payer: Self-pay | Admitting: Neurology

## 2016-08-15 NOTE — Telephone Encounter (Signed)
Wife Pam called to schedule MRI, please call [phone].

## 2016-08-17 NOTE — Telephone Encounter (Signed)
LVM for patient to call back about scheduling MRI

## 2016-08-17 NOTE — Telephone Encounter (Signed)
Pt's wife called again. Pt is anxious to get this scheduled

## 2016-08-21 DIAGNOSIS — E039 Hypothyroidism, unspecified: Secondary | ICD-10-CM | POA: Diagnosis not present

## 2016-08-21 NOTE — Telephone Encounter (Signed)
Pt returned Troy Acosta's call. Raquel Sarna was on the phone and could not take the call. Pt advised, he said to please schedule the appt. He said he gets off work @ 1pm and would like an appt around 2:30. Then leave the msg on the home VM. He advised he is at work and cannot talk. Thank you

## 2016-08-21 NOTE — Telephone Encounter (Signed)
Called the patient right and LVM for him to call back and get the appt scheduled.

## 2016-08-21 NOTE — Telephone Encounter (Signed)
Returned patients call on his home phone. Left him a message asking him to return the call as we are not able to scheduled apt's without speaking to the patient. There are required questions we must ask the patient before scheduling. Also tried calling his cell phone but he did not answer and there was not an option for VM yet.

## 2016-08-28 DIAGNOSIS — E039 Hypothyroidism, unspecified: Secondary | ICD-10-CM | POA: Diagnosis not present

## 2016-08-30 ENCOUNTER — Ambulatory Visit (INDEPENDENT_AMBULATORY_CARE_PROVIDER_SITE_OTHER): Payer: BLUE CROSS/BLUE SHIELD

## 2016-08-30 DIAGNOSIS — G8929 Other chronic pain: Secondary | ICD-10-CM

## 2016-08-30 DIAGNOSIS — R51 Headache: Secondary | ICD-10-CM

## 2016-08-30 DIAGNOSIS — R519 Headache, unspecified: Secondary | ICD-10-CM

## 2016-09-04 ENCOUNTER — Telehealth: Payer: Self-pay | Admitting: Neurology

## 2016-09-04 MED ORDER — GABAPENTIN 300 MG PO CAPS: 300.0000 mg | ORAL_CAPSULE | Freq: Three times a day (TID) | ORAL | 3 refills | Status: DC

## 2016-09-04 MED ORDER — GADOPENTETATE DIMEGLUMINE 469.01 MG/ML IV SOLN: 20.0000 mL | Freq: Once | INTRAVENOUS | Status: AC | PRN

## 2016-09-04 NOTE — Telephone Encounter (Signed)
I called the patient. The MRI of the brain shows minimal SVD. He is to increase the gabapentin to 200 mg three times a day, then go to 300 mg three times a day.  MRi brain 09/04/16:  IMPRESSION:  This MRI of the brain with and without contrast shows the following: 1.   Scattered T2/FLAIR hyperintense foci in the subcortical, deep and periventricular white matter consistent with mild chronic microvascular ischemic change. None of the foci appears to be acute. 2.   There is a normal enhancement pattern and there are no acute findings.

## 2016-09-15 ENCOUNTER — Encounter: Payer: Self-pay | Admitting: Internal Medicine

## 2016-09-21 ENCOUNTER — Ambulatory Visit (INDEPENDENT_AMBULATORY_CARE_PROVIDER_SITE_OTHER): Payer: BLUE CROSS/BLUE SHIELD | Admitting: Internal Medicine

## 2016-09-21 ENCOUNTER — Encounter: Payer: Self-pay | Admitting: Internal Medicine

## 2016-09-21 VITALS — BP 150/98 | HR 66 | Ht 71.0 in | Wt 251.6 lb

## 2016-09-21 DIAGNOSIS — I428 Other cardiomyopathies: Secondary | ICD-10-CM | POA: Diagnosis not present

## 2016-09-21 DIAGNOSIS — I10 Essential (primary) hypertension: Secondary | ICD-10-CM | POA: Diagnosis not present

## 2016-09-21 DIAGNOSIS — I480 Paroxysmal atrial fibrillation: Secondary | ICD-10-CM

## 2016-09-21 MED ORDER — AMLODIPINE BESYLATE 5 MG PO TABS: 5.0000 mg | ORAL_TABLET | Freq: Every day | ORAL | 3 refills | Status: DC

## 2016-09-21 NOTE — Progress Notes (Signed)
Electrophysiology Office Note   Date:  09/21/2016   ID:  Troy Acosta, DOB Oct 11, 1950, MRN [id number]  PCP:  Redge Gainer, MD  Cardiologist:  Dr Debara Pickett Primary Electrophysiologist: [name], MD    Chief Complaint  Patient presents with  . Atrial Fibrillation     History of Present Illness: Troy Acosta is a 65 y.o. male who presents today for electrophysiology evaluation.   No afib since ablation.  EF has recovered.  Unfortunately, he has not been very active and his weight has increased 10 lbs in 3 months!  He is hunting but otherwise inactive.  BP has been an increasing problem for him.  Today, he denies symptoms of chest pain, shortness of breath, orthopnea, PND, lower extremity edema, claudication, dizziness, presyncope, syncope, bleeding, or neurologic sequela. The patient is tolerating medications without difficulties and is otherwise without complaint today.    Past Medical History:  Diagnosis Date  . Anxiety   . Arteritis, unspecified   . Diaphragmatic hernia without mention of obstruction or gangrene   . Essential hypertension, benign   . Hyperplasia of prostate   . Iron deficiency anemia, unspecified   . Malaise and fatigue   . Obesity   . Other and unspecified hyperlipidemia   . Paroxysmal atrial fibrillation (HCC)   . Unspecified hypothyroidism    Past Surgical History:  Procedure Laterality Date  . ELECTROPHYSIOLOGIC STUDY N/A 12/24/2015   Afib ablation by Dr Rayann Heman  . KNEE SURGERY Right      Current Outpatient Prescriptions  Medication Sig Dispense Refill  . Cholecalciferol (VITAMIN D3) 1000 UNITS CAPS Take 1,000 Units by mouth at bedtime.     . gabapentin (NEURONTIN) 300 MG capsule Take 1 capsule (300 mg total) by mouth 3 (three) times daily. 90 capsule 3  . hydrochlorothiazide (HYDRODIURIL) 25 MG tablet Take 25 mg by mouth daily.    Marland Kitchen ibuprofen (ADVIL,MOTRIN) 200 MG tablet Take 200 mg by mouth daily.    . iron polysaccharides (FERREX 150) 150 MG capsule  Take 1 capsule (150 mg total) by mouth daily. 90 capsule 3  . levothyroxine (SYNTHROID, LEVOTHROID) 150 MCG tablet Take 1 tablet (150 mcg total) by mouth daily before breakfast. 90 tablet 3  . levothyroxine (SYNTHROID, LEVOTHROID) 25 MCG tablet TAKE ONE-HALF TABLET BY MOUTH ONCE DAILY BEFORE BREAKFAST 30 tablet 8  . LORazepam (ATIVAN) 0.5 MG tablet TAKE ONE TABLET BY MOUTH ONCE DAILY AS NEEDED 30 tablet 1  . metoprolol tartrate (LOPRESSOR) 25 MG tablet Take 25 mg by mouth daily.    Marland Kitchen olmesartan (BENICAR) 40 MG tablet Take 20 mg by mouth 2 (two) times daily.    . rosuvastatin (CRESTOR) 5 MG tablet Take 1 tablet (5 mg total) by mouth daily. 90 tablet 3   No current facility-administered medications for this visit.    Facility-Administered Medications Ordered in Other Visits  Medication Dose Route Frequency Provider Last Rate Last Dose  . gadopentetate dimeglumine (MAGNEVIST) injection 20 mL  20 mL Intravenous Once PRN Kathrynn Ducking, MD        Allergies:   Patient has no known allergies.   Social History:  The patient  reports that he quit smoking about 23 years ago. His smoking use included Cigarettes. He has never used smokeless tobacco. He reports that he does not drink alcohol or use drugs.   Family History:  The patient's  family history includes COPD in his father; Cancer in his brother; Crohn's disease in his sister; Diabetes in his  mother; Heart disease in his sister; Heart failure in his mother; Hypertension in his mother and sister; Lung cancer in his father and paternal grandfather; Thyroid disease in his sister; Tuberous sclerosis in his father.    ROS:  Please see the history of present illness.   All other systems are reviewed and negative.    PHYSICAL EXAM: VS:  BP (!) 150/98   Pulse 66   Ht 5\' 11"  (1.803 m)   Wt 251 lb 9.6 oz (114.1 kg)   BMI 35.09 kg/m  , BMI Body mass index is 35.09 kg/m. GEN: Well nourished, well developed, in no acute distress  HEENT: normal    Neck: no JVD, carotid bruits, or masses Cardiac: RRR; no murmurs, rubs, or gallops,no edema  Respiratory:  clear to auscultation bilaterally, normal work of breathing GI: soft, nontender, nondistended, + BS MS: no deformity or atrophy  Skin: warm and dry  Neuro:  Strength and sensation are intact Psych: euthymic mood, full affect  EKG:  EKG today reveals sinus rhythm 66 bpm   Recent Labs: 12/08/2015: Hemoglobin 15.9 01/18/2016: ALT 22 06/28/2016: BUN 22; Creatinine, Ser 0.96; Platelets 218; Potassium 3.6; Sodium 143; TSH 7.330    Lipid Panel     Component Value Date/Time   CHOL 133 12/24/2014 1710   TRIG 139 12/24/2014 1710   HDL 39 (L) 12/24/2014 1710     Wt Readings from Last 3 Encounters:  09/21/16 251 lb 9.6 oz (114.1 kg)  08/09/16 244 lb (110.7 kg)  07/17/16 243 lb (110.2 kg)    ASSESSMENT AND PLAN:  1.  Paroxysmal atrial fibrillation Doing well s/p ablation off of AAD therapy chads2vasc score is 1 (EF has recovered) Not on anticoagulation per patient preference  2. HTN Elevated Avoid NSAIDs Weight loss advised Add norvasc 5mg  daily Will order renal arterial doppler/ renal ultrasound to evaluate for RAS as a cause  3. Nonischemic CM Resolved (tachy mediated)  Current medicines are reviewed at length with the patient today.   The patient does not have concerns regarding his medicines.  The following changes were made today:  none  Return to see me in 6 months  Signed, [name], MD  09/21/2016 4:24 PM     [address] [place] [address] [phone] (office) [phone] (fax)

## 2016-09-21 NOTE — Patient Instructions (Signed)
Medication Instructions:  Your physician has recommended you make the following change in your medication:  1) Start Amlodipine 5 mg daily   Labwork: None ordered   Testing/Procedures: Your physician has requested that you have a renal artery duplex. During this test, an ultrasound is used to evaluate blood flow to the kidneys. Allow one hour for this exam. Do not eat after midnight the day before and avoid carbonated beverages. Take your medications as you usually do.    Follow-Up: Your physician wants you to follow-up in: 6 months with Dr Rayann Heman in [place] will receive a reminder letter in the mail two months in advance. If you don't receive a letter, please call our office to schedule the follow-up appointment.   Any Other Special Instructions Will Be Listed Below (If Applicable).     If you need a refill on your cardiac medications before your next appointment, please call your pharmacy.

## 2016-09-25 ENCOUNTER — Ambulatory Visit (HOSPITAL_COMMUNITY)
Admission: RE | Admit: 2016-09-25 | Discharge: 2016-09-25 | Disposition: A | Discharge status: Home / Self Care | Payer: BLUE CROSS/BLUE SHIELD | Source: Ambulatory Visit | Attending: Cardiovascular Disease | Admitting: Cardiovascular Disease

## 2016-09-25 DIAGNOSIS — I10 Essential (primary) hypertension: Secondary | ICD-10-CM | POA: Diagnosis not present

## 2016-09-26 DIAGNOSIS — M25562 Pain in left knee: Secondary | ICD-10-CM | POA: Diagnosis not present

## 2016-09-26 DIAGNOSIS — M25561 Pain in right knee: Secondary | ICD-10-CM | POA: Diagnosis not present

## 2016-09-26 DIAGNOSIS — Z4789 Encounter for other orthopedic aftercare: Secondary | ICD-10-CM | POA: Diagnosis not present

## 2016-09-26 DIAGNOSIS — M23331 Other meniscus derangements, other medial meniscus, right knee: Secondary | ICD-10-CM | POA: Diagnosis not present

## 2016-09-26 DIAGNOSIS — G8929 Other chronic pain: Secondary | ICD-10-CM | POA: Diagnosis not present

## 2016-09-27 ENCOUNTER — Ambulatory Visit: Payer: BLUE CROSS/BLUE SHIELD | Admitting: "Endocrinology

## 2016-09-27 DIAGNOSIS — Z79899 Other long term (current) drug therapy: Secondary | ICD-10-CM | POA: Diagnosis not present

## 2016-09-27 DIAGNOSIS — E039 Hypothyroidism, unspecified: Secondary | ICD-10-CM | POA: Diagnosis not present

## 2016-10-03 ENCOUNTER — Telehealth: Payer: Self-pay | Admitting: Family Medicine

## 2016-10-03 NOTE — Telephone Encounter (Signed)
FYI

## 2016-10-06 ENCOUNTER — Encounter: Payer: Self-pay | Admitting: Family Medicine

## 2016-10-06 ENCOUNTER — Ambulatory Visit (INDEPENDENT_AMBULATORY_CARE_PROVIDER_SITE_OTHER): Payer: BLUE CROSS/BLUE SHIELD | Admitting: Family Medicine

## 2016-10-06 VITALS — BP 133/88 | HR 82 | Temp 97.8°F | Ht 71.0 in | Wt 244.0 lb

## 2016-10-06 DIAGNOSIS — I10 Essential (primary) hypertension: Secondary | ICD-10-CM

## 2016-10-06 DIAGNOSIS — E034 Atrophy of thyroid (acquired): Secondary | ICD-10-CM

## 2016-10-06 DIAGNOSIS — Z0000 Encounter for general adult medical examination without abnormal findings: Secondary | ICD-10-CM | POA: Diagnosis not present

## 2016-10-06 DIAGNOSIS — E7849 Other hyperlipidemia: Secondary | ICD-10-CM

## 2016-10-06 DIAGNOSIS — N400 Enlarged prostate without lower urinary tract symptoms: Secondary | ICD-10-CM

## 2016-10-06 DIAGNOSIS — I480 Paroxysmal atrial fibrillation: Secondary | ICD-10-CM

## 2016-10-06 MED ORDER — LEVOTHYROXINE SODIUM 150 MCG PO TABS: 150.0000 ug | ORAL_TABLET | Freq: Every day | ORAL | 3 refills | Status: DC

## 2016-10-06 MED ORDER — LORAZEPAM 0.5 MG PO TABS: ORAL_TABLET | ORAL | 3 refills | Status: DC

## 2016-10-06 MED ORDER — LEVOTHYROXINE SODIUM 25 MCG PO TABS: ORAL_TABLET | ORAL | 3 refills | Status: DC

## 2016-10-06 MED ORDER — POLYSACCHARIDE IRON COMPLEX 150 MG PO CAPS: 150.0000 mg | ORAL_CAPSULE | Freq: Every day | ORAL | 3 refills | Status: DC

## 2016-10-06 MED ORDER — OLMESARTAN MEDOXOMIL 40 MG PO TABS: 20.0000 mg | ORAL_TABLET | Freq: Two times a day (BID) | ORAL | 3 refills | Status: DC

## 2016-10-06 MED ORDER — HYDROCHLOROTHIAZIDE 25 MG PO TABS: 25.0000 mg | ORAL_TABLET | Freq: Every day | ORAL | 3 refills | Status: DC

## 2016-10-06 MED ORDER — ROSUVASTATIN CALCIUM 5 MG PO TABS: 5.0000 mg | ORAL_TABLET | Freq: Every day | ORAL | 3 refills | Status: DC

## 2016-10-06 NOTE — Progress Notes (Signed)
Subjective:    Patient ID: Troy Acosta, male    DOB: 20-Jan-1951, 65 y.o.   MRN: [id number]  HPI Patient is here today for annual wellness exam and follow up of chronic medical problems which includes hypertension, hyperlipidemia and a fib. He is taking medications regularly.The patient is doing well overall. He continues to be followed by the cardiologist and everything seems to be better from the arrhythmia and atrial fibrillation. He will go back to see Dr. Debara Pickett on his next visit. He was also seen by the neurologist who evaluated him for his headaches which are also better and he had a negative MRI of the head. He has put on some weight and is making a special effort now to not drink any drinks and to lose some weight. He's also been taking some ibuprofen and he understands this can add to weight gain and elevated blood pressure. He is trying to work on the weight as I said and hoping maybe down the road he can get off of the additional amlodipine 5 mg that was added by the cardiologist. He otherwise denies chest pain shortness of breath trouble swallowing heartburn indigestion nausea vomiting diarrhea or blood in the stool. He is passing his water without problems and typically gets up at nighttime to go to the bathroom. He had a recent colonoscopy and this was good. He brings in blood pressures for review and since the amlodipine was added around the 19th or 20th of December the blood pressures have been much better. He is also losing weight and I'm sure this is helping some 2. The blood pressures to be scanned into the record. He says his blood pressures at home and been running in the 130s over the 60s now. The cardiologist also ordered an ultrasound of his kidney looking for renovascular hypertension and this ultrasound was negative.    Patient Active Problem List   Diagnosis Date Noted  . Chronic intractable headache 08/09/2016  . Other specified hypothyroidism 03/02/2016  . A-fib ([facility])  12/24/2015  . Non-ischemic cardiomyopathy ([facility]) 11/07/2015  . Long term (current) use of anticoagulants 11/07/2015  . Diaphoresis 11/01/2015  . Chest pain 11/01/2015  . PAF (paroxysmal atrial fibrillation) ([facility]) 07/15/2015  . Heart palpitations 07/11/2015  . Dyspnea 02/18/2015  . Anemia, iron deficiency 09/10/2013  . Diaphragmatic hernia without mention of obstruction or gangrene   . Hypothyroidism   . Malaise and fatigue   . Arteritis, unspecified   . Iron deficiency anemia, unspecified   . Obesity   . BPH (benign prostatic hyperplasia)   . Essential hypertension, benign   . Hyperlipidemia    Outpatient Encounter Prescriptions as of 10/06/2016  Medication Sig  . amLODipine (NORVASC) 5 MG tablet Take 1 tablet (5 mg total) by mouth daily.  . Cholecalciferol (VITAMIN D3) 1000 UNITS CAPS Take 1,000 Units by mouth at bedtime.   . hydrochlorothiazide (HYDRODIURIL) 25 MG tablet Take 25 mg by mouth daily.  Marland Kitchen ibuprofen (ADVIL,MOTRIN) 200 MG tablet Take 200 mg by mouth daily.  . iron polysaccharides (FERREX 150) 150 MG capsule Take 1 capsule (150 mg total) by mouth daily.  Marland Kitchen levothyroxine (SYNTHROID, LEVOTHROID) 150 MCG tablet Take 1 tablet (150 mcg total) by mouth daily before breakfast.  . levothyroxine (SYNTHROID, LEVOTHROID) 25 MCG tablet TAKE ONE-HALF TABLET BY MOUTH ONCE DAILY BEFORE BREAKFAST  . LORazepam (ATIVAN) 0.5 MG tablet TAKE ONE TABLET BY MOUTH ONCE DAILY AS NEEDED  . metoprolol tartrate (LOPRESSOR) 25 MG tablet Take 25 mg  by mouth daily.  Marland Kitchen olmesartan (BENICAR) 40 MG tablet Take 20 mg by mouth 2 (two) times daily.  . rosuvastatin (CRESTOR) 5 MG tablet Take 1 tablet (5 mg total) by mouth daily.  . [DISCONTINUED] gabapentin (NEURONTIN) 300 MG capsule Take 1 capsule (300 mg total) by mouth 3 (three) times daily.   Facility-Administered Encounter Medications as of 10/06/2016  Medication  . gadopentetate dimeglumine (MAGNEVIST) injection 20 mL      Review of Systems    Constitutional: Negative.   HENT: Negative.   Eyes: Negative.   Respiratory: Negative.   Cardiovascular: Negative.   Gastrointestinal: Negative.   Endocrine: Negative.   Genitourinary: Negative.   Musculoskeletal: Negative.   Skin: Negative.   Allergic/Immunologic: Negative.   Neurological: Negative.   Hematological: Negative.   Psychiatric/Behavioral: Negative.        Objective:   Physical Exam  Constitutional: He is oriented to person, place, and time. He appears well-developed and well-nourished. No distress.  Pleasant and alert and feeling better than he has felt in the past  HENT:  Head: Normocephalic and atraumatic.  Right Ear: External ear normal.  Left Ear: External ear normal.  Mouth/Throat: Oropharynx is clear and moist. No oropharyngeal exudate.  Slight nasal congestion bilaterally  Eyes: Conjunctivae and EOM are normal. Pupils are equal, round, and reactive to light. Right eye exhibits no discharge. Left eye exhibits no discharge. No scleral icterus.  Neck: Normal range of motion. Neck supple. No thyromegaly present.  Cardiovascular: Normal rate, regular rhythm, normal heart sounds and intact distal pulses.   No murmur heard. The heart is regular at 72/m  Pulmonary/Chest: Effort normal and breath sounds normal. No respiratory distress. He has no wheezes. He has no rales. He exhibits no tenderness.  No axillary adenopathy  Abdominal: Soft. Bowel sounds are normal. He exhibits no mass. There is no tenderness. There is no rebound and no guarding.  The abdomen is soft obese without masses tenderness or organ enlargement or bruits. No inguinal adenopathy.  Genitourinary: Rectum normal and penis normal.  Genitourinary Comments: Ross days slightly enlarged and firm and smooth without any lumps or masses. There are no rectal masses. External genitalia were within normal limits and there were no inguinal hernias.  Musculoskeletal: Normal range of motion. He exhibits no  edema.  Lymphadenopathy:    He has no cervical adenopathy.  Neurological: He is alert and oriented to person, place, and time. He has normal reflexes. No cranial nerve deficit.  Skin: Skin is warm and dry. No rash noted.  Psychiatric: He has a normal mood and affect. His behavior is normal. Judgment and thought content normal.  Nursing note and vitals reviewed.   BP 133/88 (BP Location: Right Arm)   Pulse 82   Temp 97.8 F (36.6 C) (Oral)   Ht 5\' 11"  (1.803 m)   Wt 244 lb (110.7 kg)   BMI 34.03 kg/m        Assessment & Plan:  1. Annual physical exam -The patient brings in lab work from the outside and this was reviewed with him during the visit today. His TSH was slightly decreased. He will get another thyroid profile in about 6 weeks at work and bring this in for [place] for review. - Urinalysis, Complete  2. Other hyperlipidemia -Continue with Crestor and aggressive therapeutic lifestyle changes  3. Paroxysmal atrial fibrillation (HCC) -Continue follow-up with cardiology. The heart was regular today and has been regular according to the patient since his ablation.  4.  Essential hypertension, benign -The blood pressure is good and improved with the addition of the amlodipine. The patient will try to reduce the use of NSAIDs and take extra strength Tylenol for headache instead of NSAIDs if possible. If he has to take NSAIDs and will try to take Aleve instead of ibuprofen.  5. Hypothyroidism due to acquired atrophy of thyroid -The TSH was slightly decreased recently but we will not make any changes in his medication at this time we will check another thyroid profile in about 6 weeks.  6. Essential hypertension -Blood pressure is better on current treatment. - iron polysaccharides (FERREX 150) 150 MG capsule; Take 1 capsule (150 mg total) by mouth daily.  Dispense: 90 capsule; Refill: 3 - levothyroxine (SYNTHROID, LEVOTHROID) 150 MCG tablet; Take 1 tablet (150 mcg total) by mouth  daily before breakfast.  Dispense: 90 tablet; Refill: 3  7. Benign prostatic hyperplasia without lower urinary tract symptoms -The patient does have nocturia but no other symptoms and his PSA is stable from 1 year ago.  8. For his iron deficiency anemia he will continue with his iron. His hemoglobin was good... Meds ordered this encounter  Medications  . levothyroxine (SYNTHROID, LEVOTHROID) 25 MCG tablet    Sig: TAKE ONE-HALF TABLET BY MOUTH ONCE DAILY BEFORE BREAKFAST    Dispense:  45 tablet    Refill:  3  . LORazepam (ATIVAN) 0.5 MG tablet    Sig: TAKE ONE TABLET BY MOUTH ONCE DAILY AS NEEDED    Dispense:  30 tablet    Refill:  3  . rosuvastatin (CRESTOR) 5 MG tablet    Sig: Take 1 tablet (5 mg total) by mouth daily.    Dispense:  90 tablet    Refill:  3  . iron polysaccharides (FERREX 150) 150 MG capsule    Sig: Take 1 capsule (150 mg total) by mouth daily.    Dispense:  90 capsule    Refill:  3  . levothyroxine (SYNTHROID, LEVOTHROID) 150 MCG tablet    Sig: Take 1 tablet (150 mcg total) by mouth daily before breakfast.    Dispense:  90 tablet    Refill:  3  . olmesartan (BENICAR) 40 MG tablet    Sig: Take 0.5 tablets (20 mg total) by mouth 2 (two) times daily.    Dispense:  180 tablet    Refill:  3  . hydrochlorothiazide (HYDRODIURIL) 25 MG tablet    Sig: Take 1 tablet (25 mg total) by mouth daily.    Dispense:  90 tablet    Refill:  3   Patient Instructions  Continue current medications. Continue good therapeutic lifestyle changes which include good diet and exercise. Fall precautions discussed with patient. If an FOBT was given today- please return it to our front desk. If you are over 52 years old - you may need Prevnar 67 or the adult Pneumonia vaccine.  **Flu shots are available--- please call and schedule a FLU-CLINIC appointment**  After your visit with [place] today you will receive a survey in the mail or online from [employer] regarding your care with [place].  Please take a moment to fill this out. Your feedback is very important to [place] as you can help [place] better understand your patient needs as well as improve your experience and satisfaction. WE CARE ABOUT YOU!!!   Continue to work on getting weight down Watch sodium intake Decrease calorie intake Follow-up with cardiology as planned Continue aggressive therapeutic lifestyle changes Repeat thyroid profile in  6 weeks and we will adjust medicine if we think it is necessary at that time Continue to monitor blood pressures at home   Arrie Senate MD

## 2016-10-06 NOTE — Patient Instructions (Addendum)
Continue current medications. Continue good therapeutic lifestyle changes which include good diet and exercise. Fall precautions discussed with patient. If an FOBT was given today- please return it to our front desk. If you are over 63 years old - you may need Prevnar 27 or the adult Pneumonia vaccine.  **Flu shots are available--- please call and schedule a FLU-CLINIC appointment**  After your visit with [place] today you will receive a survey in the mail or online from [employer] regarding your care with [place]. Please take a moment to fill this out. Your feedback is very important to [place] as you can help [place] better understand your patient needs as well as improve your experience and satisfaction. WE CARE ABOUT YOU!!!   Continue to work on getting weight down Watch sodium intake Decrease calorie intake Follow-up with cardiology as planned Continue aggressive therapeutic lifestyle changes Repeat thyroid profile in 6 weeks and we will adjust medicine if we think it is necessary at that time Continue to monitor blood pressures at home

## 2016-10-13 ENCOUNTER — Ambulatory Visit: Payer: BLUE CROSS/BLUE SHIELD | Admitting: Internal Medicine

## 2016-10-16 DIAGNOSIS — Z79899 Other long term (current) drug therapy: Secondary | ICD-10-CM | POA: Diagnosis not present

## 2016-10-16 DIAGNOSIS — E782 Mixed hyperlipidemia: Secondary | ICD-10-CM | POA: Diagnosis not present

## 2016-10-16 DIAGNOSIS — D509 Iron deficiency anemia, unspecified: Secondary | ICD-10-CM | POA: Diagnosis not present

## 2016-10-16 DIAGNOSIS — E039 Hypothyroidism, unspecified: Secondary | ICD-10-CM | POA: Diagnosis not present

## 2016-11-01 DIAGNOSIS — E039 Hypothyroidism, unspecified: Secondary | ICD-10-CM | POA: Diagnosis not present

## 2016-11-06 DIAGNOSIS — E039 Hypothyroidism, unspecified: Secondary | ICD-10-CM | POA: Diagnosis not present

## 2016-11-08 ENCOUNTER — Ambulatory Visit: Payer: BLUE CROSS/BLUE SHIELD | Admitting: "Endocrinology

## 2016-11-29 DIAGNOSIS — D225 Melanocytic nevi of trunk: Secondary | ICD-10-CM | POA: Diagnosis not present

## 2016-11-29 DIAGNOSIS — L821 Other seborrheic keratosis: Secondary | ICD-10-CM | POA: Diagnosis not present

## 2016-11-29 DIAGNOSIS — D235 Other benign neoplasm of skin of trunk: Secondary | ICD-10-CM | POA: Diagnosis not present

## 2016-12-07 DIAGNOSIS — Z4789 Encounter for other orthopedic aftercare: Secondary | ICD-10-CM | POA: Diagnosis not present

## 2016-12-07 DIAGNOSIS — M170 Bilateral primary osteoarthritis of knee: Secondary | ICD-10-CM | POA: Diagnosis not present

## 2016-12-11 ENCOUNTER — Telehealth: Payer: Self-pay | Admitting: Internal Medicine

## 2016-12-11 DIAGNOSIS — E039 Hypothyroidism, unspecified: Secondary | ICD-10-CM | POA: Diagnosis not present

## 2016-12-11 NOTE — Telephone Encounter (Signed)
[facility] requests clearance for: 1. Type of surgery: right knee: TKA - medial & lateral w/wo patella resurfacing 2. Date of surgery: pending clearance 3. Surgeon: Dr. Esmond Plants 4. Medications that need to be held & how long: none specified  5. Fax and/or Phone: (p) [phone]  [phone]  -- attn: Santiago Bur

## 2016-12-12 NOTE — Telephone Encounter (Signed)
Low risk for surgery. He is not on anticoagulation per his preference.  Dr. Lemmie Evens

## 2016-12-13 ENCOUNTER — Ambulatory Visit: Payer: BLUE CROSS/BLUE SHIELD | Admitting: Neurology

## 2016-12-13 NOTE — Telephone Encounter (Signed)
Clearance routed via EPIC 

## 2016-12-20 DIAGNOSIS — E039 Hypothyroidism, unspecified: Secondary | ICD-10-CM | POA: Diagnosis not present

## 2016-12-20 DIAGNOSIS — D509 Iron deficiency anemia, unspecified: Secondary | ICD-10-CM | POA: Diagnosis not present

## 2016-12-20 DIAGNOSIS — I10 Essential (primary) hypertension: Secondary | ICD-10-CM | POA: Diagnosis not present

## 2017-01-15 DIAGNOSIS — E039 Hypothyroidism, unspecified: Secondary | ICD-10-CM | POA: Diagnosis not present

## 2017-01-15 DIAGNOSIS — Z008 Encounter for other general examination: Secondary | ICD-10-CM | POA: Diagnosis not present

## 2017-01-15 DIAGNOSIS — E782 Mixed hyperlipidemia: Secondary | ICD-10-CM | POA: Diagnosis not present

## 2017-01-15 DIAGNOSIS — E669 Obesity, unspecified: Secondary | ICD-10-CM | POA: Diagnosis not present

## 2017-01-15 DIAGNOSIS — D509 Iron deficiency anemia, unspecified: Secondary | ICD-10-CM | POA: Diagnosis not present

## 2017-01-15 DIAGNOSIS — Z719 Counseling, unspecified: Secondary | ICD-10-CM | POA: Diagnosis not present

## 2017-01-31 ENCOUNTER — Other Ambulatory Visit: Payer: Self-pay | Admitting: Family Medicine

## 2017-01-31 MED ORDER — LORAZEPAM 0.5 MG PO TABS: ORAL_TABLET | ORAL | 0 refills | Status: DC

## 2017-01-31 NOTE — Telephone Encounter (Signed)
Please call in lorazepam with 1 refills 

## 2017-01-31 NOTE — Telephone Encounter (Signed)
Rx called into the pharmacy and left message on pt voicemail that requested rx called into pharmacy and for pt to call back with any questions.

## 2017-01-31 NOTE — Telephone Encounter (Signed)
Filled 01/04/17, last seen 10/06/17. Call in

## 2017-02-07 ENCOUNTER — Ambulatory Visit (INDEPENDENT_AMBULATORY_CARE_PROVIDER_SITE_OTHER): Payer: BLUE CROSS/BLUE SHIELD | Admitting: Family Medicine

## 2017-02-07 ENCOUNTER — Encounter: Payer: Self-pay | Admitting: Family Medicine

## 2017-02-07 VITALS — BP 125/76 | HR 70 | Temp 97.5°F | Ht 71.0 in | Wt 245.0 lb

## 2017-02-07 DIAGNOSIS — I10 Essential (primary) hypertension: Secondary | ICD-10-CM | POA: Diagnosis not present

## 2017-02-07 DIAGNOSIS — I480 Paroxysmal atrial fibrillation: Secondary | ICD-10-CM | POA: Diagnosis not present

## 2017-02-07 DIAGNOSIS — E784 Other hyperlipidemia: Secondary | ICD-10-CM | POA: Diagnosis not present

## 2017-02-07 DIAGNOSIS — E7849 Other hyperlipidemia: Secondary | ICD-10-CM

## 2017-02-07 DIAGNOSIS — M1711 Unilateral primary osteoarthritis, right knee: Secondary | ICD-10-CM

## 2017-02-07 DIAGNOSIS — E034 Atrophy of thyroid (acquired): Secondary | ICD-10-CM | POA: Diagnosis not present

## 2017-02-07 NOTE — Patient Instructions (Addendum)
Continue current medications. Continue good therapeutic lifestyle changes which include good diet and exercise. Fall precautions discussed with patient. If an FOBT was given today- please return it to our front desk. If you are over 64 years old - you may need Prevnar 49 or the adult Pneumonia vaccine.  **Flu shots are available--- please call and schedule a FLU-CLINIC appointment**  After your visit with [place] today you will receive a survey in the mail or online from [employer] regarding your care with [place]. Please take a moment to fill this out. Your feedback is very important to [place] as you can help [place] better understand your patient needs as well as improve your experience and satisfaction. WE CARE ABOUT YOU!!!   Follow-up with orthopedic surgeon as planned for right knee replacement Follow-up with cardiology as planned in June Continue to work on weight by drinking plenty of water and staying well-hydrated and eating less carbs in the diet Decrease the use of the overhead fan at night and see if this reduces the frequency of headaches in the morning

## 2017-02-07 NOTE — Progress Notes (Signed)
Subjective:    Patient ID: Troy Acosta, male    DOB: May 28, 1951, 66 y.o.   MRN: [id number]  HPI Pt here for follow up and management of chronic medical problems which includes hypothyroid, hypertension, and hyperlipidemia. He is taking medication regularly.The patient is doing well with no specific complaints. He has had problems with cardiomyopathy and atrial fibrillation. He is still followed by the cardiologist periodically. His vital signs are stable and his weight remains elevated at 245 with a BMI of 34. He is also on thyroid replacement medicine. The patient had ablation therapy and is followed for his paroxysmal atrial fibrillation by Dr. Rayann Heman. He has a follow-up appointment with Dr. Rayann Heman sometime in May or June. He had an MRI of the brain with and without contrast which basically showed. Ventricular white matter consistent with mild chronic microvascular ischemic changes but no significant abnormality. He had a chest x-ray about one year ago which showed no active cardiovascular disease. He is due to get blood work today. The patient denies any chest pain shortness of breath trouble swallowing heartburn indigestion nausea vomiting diarrhea or blood in the stool. He does have dark stools but he takes iron chronically. He's passing his water without problems. He is due to have a total right knee replacement this month by his orthopedic surgeon. He may have to have the same thing done to the left knee sometime in the future. His heart appointment with Dr. Rayann Heman is in June. He will also be due his regular eye exam this year. His next colonoscopy would not be until 2026. The patient indicates that home blood pressure readings have been good anywhere from 109/57 up to as high as 132 over the 60s.   Patient Active Problem List   Diagnosis Date Noted  . Chronic intractable headache 08/09/2016  . Other specified hypothyroidism 03/02/2016  . A-fib ([place]) 12/24/2015  . Non-ischemic cardiomyopathy  ([place]) 11/07/2015  . Long term (current) use of anticoagulants 11/07/2015  . Diaphoresis 11/01/2015  . Chest pain 11/01/2015  . PAF (paroxysmal atrial fibrillation) ([place]) 07/15/2015  . Heart palpitations 07/11/2015  . Dyspnea 02/18/2015  . Anemia, iron deficiency 09/10/2013  . Diaphragmatic hernia without mention of obstruction or gangrene   . Hypothyroidism   . Malaise and fatigue   . Arteritis, unspecified ([place])   . Iron deficiency anemia, unspecified   . Obesity   . BPH (benign prostatic hyperplasia)   . Essential hypertension, benign   . Hyperlipidemia    Outpatient Encounter Prescriptions as of 02/07/2017  Medication Sig  . amLODipine (NORVASC) 5 MG tablet Take 1 tablet (5 mg total) by mouth daily.  . Cholecalciferol (VITAMIN D3) 1000 UNITS CAPS Take 1,000 Units by mouth at bedtime.   . hydrochlorothiazide (HYDRODIURIL) 25 MG tablet Take 1 tablet (25 mg total) by mouth daily.  Marland Kitchen ibuprofen (ADVIL,MOTRIN) 200 MG tablet Take 200 mg by mouth daily.  . iron polysaccharides (FERREX 150) 150 MG capsule Take 1 capsule (150 mg total) by mouth daily.  Marland Kitchen levothyroxine (SYNTHROID, LEVOTHROID) 150 MCG tablet Take 1 tablet (150 mcg total) by mouth daily before breakfast.  . levothyroxine (SYNTHROID, LEVOTHROID) 25 MCG tablet TAKE ONE-HALF TABLET BY MOUTH ONCE DAILY BEFORE BREAKFAST  . LORazepam (ATIVAN) 0.5 MG tablet TAKE ONE TABLET BY MOUTH ONCE DAILY AS NEEDED  . metoprolol tartrate (LOPRESSOR) 25 MG tablet Take 25 mg by mouth daily.  Marland Kitchen olmesartan (BENICAR) 40 MG tablet Take 0.5 tablets (20 mg total) by mouth 2 (two) times  daily.  . rosuvastatin (CRESTOR) 5 MG tablet Take 1 tablet (5 mg total) by mouth daily.   Facility-Administered Encounter Medications as of 02/07/2017  Medication  . gadopentetate dimeglumine (MAGNEVIST) injection 20 mL       Review of Systems  Constitutional: Negative.   HENT: Negative.   Eyes: Negative.   Respiratory: Negative.   Cardiovascular: Negative.     Gastrointestinal: Negative.   Endocrine: Negative.   Genitourinary: Negative.   Musculoskeletal: Positive for arthralgias (right knee= Total knee surgery 03/02/17. Dr Veverly Fells).  Skin: Negative.   Allergic/Immunologic: Negative.   Neurological: Negative.   Hematological: Negative.   Psychiatric/Behavioral: Negative.        Objective:   Physical Exam  Constitutional: He is oriented to person, place, and time. He appears well-developed and well-nourished. No distress.  The patient is pleasant and alert and has no specific complaints today. He is in good spirits. The cardiologist has already okayed him to have his right knee replacement.  HENT:  Head: Normocephalic and atraumatic.  Right Ear: External ear normal.  Left Ear: External ear normal.  Nose: Nose normal.  Mouth/Throat: Oropharynx is clear and moist. No oropharyngeal exudate.  Eyes: Conjunctivae and EOM are normal. Pupils are equal, round, and reactive to light. Right eye exhibits no discharge. Left eye exhibits no discharge. No scleral icterus.  Neck: Normal range of motion. Neck supple. No thyromegaly present.  Cardiovascular: Normal rate, regular rhythm, normal heart sounds and intact distal pulses.   No murmur heard. The heart has a regular rate and rhythm at 72/m  Pulmonary/Chest: Effort normal and breath sounds normal. No respiratory distress. He has no wheezes. He has no rales. He exhibits no tenderness.  Clear anteriorly and posteriorly  Abdominal: Soft. Bowel sounds are normal. He exhibits no mass. There is no tenderness. There is no rebound and no guarding.  No liver or spleen enlargement. No inguinal adenopathy. No abdominal tenderness. No masses.  Musculoskeletal: Normal range of motion. He exhibits no edema.  The patient has knee pain bilaterally at both joint lines of each knee with right being greater than left.  Lymphadenopathy:    He has no cervical adenopathy.  Neurological: He is alert and oriented to  person, place, and time. He has normal reflexes. No cranial nerve deficit.  Skin: Skin is warm and dry. No rash noted.  Psychiatric: He has a normal mood and affect. His behavior is normal. Judgment and thought content normal.  Nursing note and vitals reviewed.  BP 125/76 (BP Location: Left Arm)   Pulse 70   Temp 97.5 F (36.4 C) (Oral)   Ht '5\' 11"'  (1.803 m)   Wt 245 lb (111.1 kg)   BMI 34.17 kg/m         Assessment & Plan:  1. Other hyperlipidemia -Continue with aggressive therapeutic lifestyle changes and current dose of Crestor.  2. Paroxysmal atrial fibrillation ([place]) -As far as the patient can tell he hasn't had no further episodes of atrial fibrillation especially since the ablation. -He will follow-up with the cardiologist as planned  3. Essential hypertension, benign -The blood pressure was good today and he has had similar are lower readings at home. - BMP8+EGFR - Hepatic function panel  4. Hypothyroidism due to acquired atrophy of thyroid -Continue current treatment pending results of lab work  5. Primary osteoarthritis of right knee -Follow-up with orthopedic surgeon as planned for knee replacement later this month Patient Instructions  Continue current medications. Continue good therapeutic lifestyle changes  which include good diet and exercise. Fall precautions discussed with patient. If an FOBT was given today- please return it to our front desk. If you are over 31 years old - you may need Prevnar 19 or the adult Pneumonia vaccine.  **Flu shots are available--- please call and schedule a FLU-CLINIC appointment**  After your visit with [place] today you will receive a survey in the mail or online from [employer] regarding your care with [place]. Please take a moment to fill this out. Your feedback is very important to [place] as you can help [place] better understand your patient needs as well as improve your experience and satisfaction. WE CARE ABOUT YOU!!!   Follow-up with  orthopedic surgeon as planned for right knee replacement Follow-up with cardiology as planned in June Continue to work on weight by drinking plenty of water and staying well-hydrated and eating less carbs in the diet Decrease the use of the overhead fan at night and see if this reduces the frequency of headaches in the morning   Arrie Senate MD   .

## 2017-02-08 LAB — HEPATIC FUNCTION PANEL
ALT: 19 IU/L (ref 0–44)
AST: 17 IU/L (ref 0–40)
Albumin: 4.6 g/dL (ref 3.6–4.8)
Alkaline Phosphatase: 69 IU/L (ref 39–117)
BILIRUBIN TOTAL: 0.3 mg/dL (ref 0.0–1.2)
BILIRUBIN, DIRECT: 0.11 mg/dL (ref 0.00–0.40)
Total Protein: 7.2 g/dL (ref 6.0–8.5)

## 2017-02-08 LAB — BMP8+EGFR
BUN/Creatinine Ratio: 19 (ref 10–24)
BUN: 16 mg/dL (ref 8–27)
CALCIUM: 9.2 mg/dL (ref 8.6–10.2)
CHLORIDE: 97 mmol/L (ref 96–106)
CO2: 27 mmol/L (ref 18–29)
CREATININE: 0.86 mg/dL (ref 0.76–1.27)
GFR calc Af Amer: 107 mL/min/{1.73_m2} (ref >59)
GFR calc non Af Amer: 92 mL/min/{1.73_m2} (ref >59)
GLUCOSE: 92 mg/dL (ref 65–99)
Potassium: 3.8 mmol/L (ref 3.5–5.2)
Sodium: 141 mmol/L (ref 134–144)

## 2017-02-13 NOTE — H&P (Signed)
Troy Acosta is an 66 y.o. male.    Chief Complaint: right knee pain HPI: Pt is a 66 y.o. male complaining of right knee pain for multiple years. Pain had continually increased since the beginning. X-rays in the clinic show end-stage arthritic changes of the right knee. Pt has tried various conservative treatments which have failed to alleviate their symptoms, including injections and therapy. Various options are discussed with the patient. Risks, benefits and expectations were discussed with the patient. Patient understand the risks, benefits and expectations and wishes to proceed with surgery.   PCP:  Chipper Herb, MD  D/C Plans: Home  PMH: Past Medical History:  Diagnosis Date  . Anxiety   . Arteritis, unspecified ([place])   . Diaphragmatic hernia without mention of obstruction or gangrene   . Essential hypertension, benign   . Hyperplasia of prostate   . Iron deficiency anemia, unspecified   . Malaise and fatigue   . Obesity   . Other and unspecified hyperlipidemia   . Paroxysmal atrial fibrillation ([facility])   . Unspecified hypothyroidism     PSH: Past Surgical History:  Procedure Laterality Date  . ELECTROPHYSIOLOGIC STUDY N/A 12/24/2015   Afib ablation by Dr Rayann Heman  . KNEE SURGERY Right     Social History:  reports that he quit smoking about 24 years ago. His smoking use included Cigarettes. He has never used smokeless tobacco. He reports that he does not drink alcohol or use drugs.  Allergies:  No Known Allergies  Medications: No current facility-administered medications for this encounter.    Current Outpatient Prescriptions  Medication Sig Dispense Refill  . amLODipine (NORVASC) 5 MG tablet Take 1 tablet (5 mg total) by mouth daily. 180 tablet 3  . Cholecalciferol (VITAMIN D3) 1000 UNITS CAPS Take 1,000 Units by mouth at bedtime.     . hydrochlorothiazide (HYDRODIURIL) 25 MG tablet Take 1 tablet (25 mg total) by mouth daily. 90 tablet 3  . ibuprofen (ADVIL,MOTRIN)  200 MG tablet Take 200 mg by mouth daily.    . iron polysaccharides (FERREX 150) 150 MG capsule Take 1 capsule (150 mg total) by mouth daily. 90 capsule 3  . levothyroxine (SYNTHROID, LEVOTHROID) 150 MCG tablet Take 1 tablet (150 mcg total) by mouth daily before breakfast. 90 tablet 3  . levothyroxine (SYNTHROID, LEVOTHROID) 25 MCG tablet TAKE ONE-HALF TABLET BY MOUTH ONCE DAILY BEFORE BREAKFAST 45 tablet 3  . LORazepam (ATIVAN) 0.5 MG tablet TAKE ONE TABLET BY MOUTH ONCE DAILY AS NEEDED 30 tablet 0  . metoprolol tartrate (LOPRESSOR) 25 MG tablet Take 25 mg by mouth daily.    Marland Kitchen olmesartan (BENICAR) 40 MG tablet Take 0.5 tablets (20 mg total) by mouth 2 (two) times daily. 180 tablet 3  . rosuvastatin (CRESTOR) 5 MG tablet Take 1 tablet (5 mg total) by mouth daily. 90 tablet 3   Facility-Administered Medications Ordered in Other Encounters  Medication Dose Route Frequency Provider Last Rate Last Dose  . gadopentetate dimeglumine (MAGNEVIST) injection 20 mL  20 mL Intravenous Once PRN Kathrynn Ducking, MD        No results found for this or any previous visit (from the past 48 hour(s)). No results found.  ROS: Pain with rom of the right lower extremity  Physical Exam:  Alert and oriented 66 y.o. male in no acute distress Cranial nerves 2-12 intact Cervical spine: full rom with no tenderness, nv intact distally Chest: active breath sounds bilaterally, no wheeze rhonchi or rales Heart: regular rate and  rhythm, no murmur Abd: non tender non distended with active bowel sounds Hip is stable with rom  Right knee with moderate medial and lateral joint line tenderness nv intact distally No rashes or edema Antalgic gait  Assessment/Plan Assessment: right knee end stage osteoarthritis  Plan: Patient will undergo a right total knee by Dr. Veverly Fells at [facility]. Risks benefits and expectations were discussed with the patient. Patient understand risks, benefits and expectations and wishes to  proceed.

## 2017-02-16 ENCOUNTER — Encounter (HOSPITAL_COMMUNITY): Payer: Self-pay

## 2017-02-16 ENCOUNTER — Encounter (HOSPITAL_COMMUNITY)
Admission: RE | Admit: 2017-02-16 | Discharge: 2017-02-16 | Disposition: A | Discharge status: Home / Self Care | Payer: BLUE CROSS/BLUE SHIELD | Source: Ambulatory Visit | Attending: Orthopedic Surgery | Admitting: Orthopedic Surgery

## 2017-02-16 DIAGNOSIS — M1711 Unilateral primary osteoarthritis, right knee: Secondary | ICD-10-CM | POA: Insufficient documentation

## 2017-02-16 HISTORY — DX: Headache, unspecified: R51.9

## 2017-02-16 HISTORY — DX: Cardiac arrhythmia, unspecified: I49.9

## 2017-02-16 HISTORY — DX: Unspecified osteoarthritis, unspecified site: M19.90

## 2017-02-16 HISTORY — DX: Headache: R51

## 2017-02-16 LAB — SURGICAL PCR SCREEN
MRSA, PCR: NEGATIVE
Staphylococcus aureus: NEGATIVE

## 2017-02-16 LAB — BASIC METABOLIC PANEL
Anion gap: 8 (ref 5–15)
BUN: 16 mg/dL (ref 6–20)
CHLORIDE: 101 mmol/L (ref 101–111)
CO2: 30 mmol/L (ref 22–32)
CREATININE: 1.03 mg/dL (ref 0.61–1.24)
Calcium: 9.5 mg/dL (ref 8.9–10.3)
GFR calc non Af Amer: >60 mL/min (ref >60)
Glucose, Bld: 108 mg/dL — ABNORMAL HIGH (ref 65–99)
POTASSIUM: 4.7 mmol/L (ref 3.5–5.1)
Sodium: 139 mmol/L (ref 135–145)

## 2017-02-16 LAB — CBC
HEMATOCRIT: 45.4 % (ref 39.0–52.0)
Hemoglobin: 15.6 g/dL (ref 13.0–17.0)
MCH: 30.2 pg (ref 26.0–34.0)
MCHC: 34.4 g/dL (ref 30.0–36.0)
MCV: 88 fL (ref 78.0–100.0)
PLATELETS: 191 10*3/uL (ref 150–400)
RBC: 5.16 MIL/uL (ref 4.22–5.81)
RDW: 13.6 % (ref 11.5–15.5)
WBC: 8.4 10*3/uL (ref 4.0–10.5)

## 2017-02-16 NOTE — Pre-Procedure Instructions (Signed)
Troy Acosta  02/16/2017      [place], [place] [address] Phone: [phone] Fax: [phone]  [place] [address], [place] - [place] [address] Phone: [phone] Fax: [phone]  [place], [place] [address] [place] [address] Phone: [phone] Fax: [place], [place] [address] [place] [address] [place] [address] Phone: [phone] Fax: [phone]    Your procedure is scheduled on Friday, May 25.  Report to [facility] Admitting at 8:30 AM                Your surgery or procedure is scheduled for 10:30 AM   Call this number if you have problems the morning of surgery: [phone]                For any other questions, please call [phone], Monday - Friday 8 AM - 4 PM.     Remember:  Do not eat food or drink liquids after midnight Thursday, May 24.  Take these medicines the morning of surgery with A SIP OF WATER: amLODipine (NORVASC), levothyroxine (SYNTHROID, LEVOTHROID),  LORazepam (ATIVAN).  Take if needed : metoprolol tartrate (LOPRESSOR).  May take Tylenol if needed.                    1 Week prior to surgery STOP taking Aspirin, Aspirin Products (Goody Powder, Excedrin Migraine), Ibuprofen (Advil), Naproxen (Aleve), Vitamins and Herbal Products (ie Fish Oil) Special instructions:   - Preparing For Surgery  Before surgery, you can play an important role. Because skin is not sterile, your skin needs to be as free of germs as possible. You can reduce the number of germs on your skin by washing with CHG (chlorahexidine gluconate) Soap before surgery.  CHG is an antiseptic cleaner which kills germs and bonds with the skin to continue killing germs even after washing.  Please do not use if you have an  allergy to CHG or antibacterial soaps. If your skin becomes reddened/irritated stop using the CHG.  Do not shave (including legs and underarms) for at least 48 hours prior to first CHG shower. It is OK to shave your face.  Please follow these instructions carefully.   1. Shower the NIGHT BEFORE SURGERY and the MORNING OF SURGERY with CHG.   2. If you chose to wash your hair, wash your hair first as usual with your normal shampoo.  3. After you shampoo, rinse your hair and body thoroughly to remove the shampoo.  4. Use CHG as you would any other liquid soap. You can apply CHG directly to the skin and wash gently with a scrungie or a clean washcloth.   5. Apply the CHG Soap to your body ONLY FROM THE NECK DOWN.  Do not use on open wounds or open sores. Avoid contact with your eyes, ears, mouth and genitals (private parts). Wash genitals (private parts) with your normal soap.  6. Wash thoroughly, paying special attention to the area where your surgery will be performed.  7. Thoroughly rinse your body with warm water from the neck down.  8. DO NOT shower/wash with your normal soap after using and rinsing off the CHG Soap.  9. Pat yourself dry with a CLEAN  TOWEL.   10. Wear CLEAN PAJAMAS   11. Place CLEAN SHEETS on your bed the night of your first shower and DO NOT SLEEP WITH PETS.  Day of Surgery: Do not apply any deodorants/lotionspowders, or perfumes, . Please wear clean clothes to the hospital/surgery center.    Do not wear jewelry, make-up or nail polish.  Do not shave 48 hours prior to surgery.  Men may shave face and neck.  Do not bring valuables to the hospital.  [facility] is not responsible for any belongings or valuables.  Contacts, dentures or bridgework may not be worn into surgery.  Leave your suitcase in the car.  After surgery it may be brought to your room.  For patients admitted to the hospital, discharge time will be determined by your treatment team.  Patients  discharged the day of surgery will not be allowed to drive home.   Please read over the following fact sheets that you were given: Patient Instructions for Mupriocin Application, Incentive Spirometry, Pain Booklet, Surgical Site Infections

## 2017-02-16 NOTE — Progress Notes (Signed)
Pt. Currently followed by Dr. Rayann Heman for afib in 2017, no problem since ablation.. Off blood thinner for several months. PCP- Dr. Laurance Flatten in [place], [place].  Pt. Denies all chest & breathing concerns, still works actively as an Clinical biochemist.  Takes Metoprolol only PRN, he carries in his pocket at all times a pulse oximeter & if he feels his heart rate increase he checks his pulse & takes metoprolol as needed.

## 2017-02-19 NOTE — Progress Notes (Signed)
Anesthesia Chart Review:  Pt is a 66 year old male scheduled for R total knee arthroplasty on 03/02/2017 with [name], MD  - PCP is Redge Gainer, MD who is aware of upcoming surgery  - Cardiologist is Lyman Bishop, MD who cleared pt for surgery -  EPardiologist is [name], MD  PMH includes:  Atrial fibrillation (s/p successful ablation 12/24/15), HTN, hyperlipidemia, hypothyroidism, iron deficiency anemia. Former smoker. BMI 34  Medications include: Amlodipine, HCTZ, iron, levothyroxine, metoprolol, olmesartan, rosuvastatin  Preoperative labs reviewed.    CXR 03/07/16: No active cardiopulmonary disease.  EKG 09/21/16: NSR  Echo 06/28/16:  - Left ventricle: The cavity size was normal. Wall thickness was normal. The estimated ejection fraction was 55%. Normal global longitudinal strain of -18.7% and LVEF calculated 58% by biplane speckle tracking. Wall motion was normal; there were no regional wall motion abnormalities. Doppler parameters are consistent with abnormal left ventricular relaxation (grade 1 diastolic dysfunction). - Mitral valve: There was trivial regurgitation. - Left atrium: The atrium was at the upper limits of normal in size. - Tricuspid valve: There was trivial regurgitation. - Pulmonary arteries: Systolic pressure could not be accurately estimated. - Pericardium, extracardiac: There was no pericardial effusion. - Impressions: Normal LV wall thickness with LVEF 55%. Normal global longitudinal strain of -18.7% and LVEF 58% by biplane speckle tracking. Grade 1 diastolic dysfunction. Upper normal left atrial chamber size. Trivial mitral and tricuspid regurgitation.  Cardiac event monitor 07/08/15: Atrial fibrillation/flutter with RVR on 10/3 and 10/4. New onset.  Exercise tolerance test 03/24/15:  - Baseline ECG is normal.  - There was no ST segment deviation noted during stress.  - Arrhythmias during stress: none.  - Arrhythmias during recovery: none.  - There  were no significant arrhythmias noted during the test.  - ECG was interpretable and conclusive.  If no changes, I anticipate pt can proceed with surgery as scheduled.   Willeen Cass, FNP-BC [facility]Anesthesiology Phone: [phone] 02/19/2017 2:43 PM

## 2017-03-01 MED ORDER — SODIUM CHLORIDE 0.9 % IV SOLN
1000.0000 mg | INTRAVENOUS | Status: AC
  Administered 2017-03-02: 1000 mg via INTRAVENOUS
  Filled 2017-03-01: qty 10

## 2017-03-02 ENCOUNTER — Inpatient Hospital Stay (HOSPITAL_COMMUNITY): Payer: BLUE CROSS/BLUE SHIELD | Admitting: Anesthesiology

## 2017-03-02 ENCOUNTER — Inpatient Hospital Stay (HOSPITAL_COMMUNITY): Payer: BLUE CROSS/BLUE SHIELD

## 2017-03-02 ENCOUNTER — Encounter (HOSPITAL_COMMUNITY)
Admission: RE | Disposition: A | Discharge status: Home Health | Payer: Self-pay | Source: Ambulatory Visit | Attending: Orthopedic Surgery

## 2017-03-02 ENCOUNTER — Inpatient Hospital Stay (HOSPITAL_COMMUNITY)
Admission: RE | Admit: 2017-03-02 | Discharge: 2017-03-05 | DRG: 470 | Disposition: A | Discharge status: Home Health | Payer: BLUE CROSS/BLUE SHIELD | Source: Ambulatory Visit | Attending: Orthopedic Surgery | Admitting: Orthopedic Surgery

## 2017-03-02 ENCOUNTER — Inpatient Hospital Stay (HOSPITAL_COMMUNITY): Payer: BLUE CROSS/BLUE SHIELD | Admitting: Emergency Medicine

## 2017-03-02 DIAGNOSIS — R079 Chest pain, unspecified: Secondary | ICD-10-CM | POA: Diagnosis not present

## 2017-03-02 DIAGNOSIS — E039 Hypothyroidism, unspecified: Secondary | ICD-10-CM | POA: Diagnosis not present

## 2017-03-02 DIAGNOSIS — I10 Essential (primary) hypertension: Secondary | ICD-10-CM | POA: Diagnosis present

## 2017-03-02 DIAGNOSIS — N400 Enlarged prostate without lower urinary tract symptoms: Secondary | ICD-10-CM | POA: Diagnosis present

## 2017-03-02 DIAGNOSIS — I4891 Unspecified atrial fibrillation: Secondary | ICD-10-CM | POA: Diagnosis not present

## 2017-03-02 DIAGNOSIS — G8918 Other acute postprocedural pain: Secondary | ICD-10-CM | POA: Diagnosis not present

## 2017-03-02 DIAGNOSIS — Z87891 Personal history of nicotine dependence: Secondary | ICD-10-CM | POA: Diagnosis not present

## 2017-03-02 DIAGNOSIS — M1711 Unilateral primary osteoarthritis, right knee: Secondary | ICD-10-CM | POA: Diagnosis not present

## 2017-03-02 DIAGNOSIS — Z79899 Other long term (current) drug therapy: Secondary | ICD-10-CM

## 2017-03-02 DIAGNOSIS — Z96651 Presence of right artificial knee joint: Secondary | ICD-10-CM | POA: Diagnosis not present

## 2017-03-02 DIAGNOSIS — I480 Paroxysmal atrial fibrillation: Secondary | ICD-10-CM | POA: Diagnosis not present

## 2017-03-02 DIAGNOSIS — E785 Hyperlipidemia, unspecified: Secondary | ICD-10-CM | POA: Diagnosis present

## 2017-03-02 HISTORY — PX: TOTAL KNEE ARTHROPLASTY: SHX125

## 2017-03-02 SURGERY — ARTHROPLASTY, KNEE, TOTAL
Anesthesia: Spinal | Site: Knee | Laterality: Right

## 2017-03-02 MED ORDER — MENTHOL 3 MG MT LOZG: 1.0000 | LOZENGE | OROMUCOSAL | Status: DC | PRN

## 2017-03-02 MED ORDER — CEFAZOLIN SODIUM-DEXTROSE 2-4 GM/100ML-% IV SOLN
2.0000 g | Freq: Four times a day (QID) | INTRAVENOUS | Status: AC
  Administered 2017-03-02 (×2): 2 g via INTRAVENOUS
  Filled 2017-03-02 (×2): qty 100

## 2017-03-02 MED ORDER — CHLORHEXIDINE GLUCONATE 4 % EX LIQD: 60.0000 mL | Freq: Once | CUTANEOUS | Status: DC

## 2017-03-02 MED ORDER — ONDANSETRON HCL 4 MG/2ML IJ SOLN
INTRAMUSCULAR | Status: DC | PRN
  Administered 2017-03-02: 4 mg via INTRAVENOUS

## 2017-03-02 MED ORDER — LIDOCAINE 2% (20 MG/ML) 5 ML SYRINGE
INTRAMUSCULAR | Status: AC
  Filled 2017-03-02: qty 5

## 2017-03-02 MED ORDER — POLYETHYLENE GLYCOL 3350 17 G PO PACK
17.0000 g | PACK | Freq: Every day | ORAL | Status: DC | PRN
  Administered 2017-03-05: 17 g via ORAL
  Filled 2017-03-02: qty 1

## 2017-03-02 MED ORDER — TRANEXAMIC ACID 1000 MG/10ML IV SOLN
1000.0000 mg | Freq: Once | INTRAVENOUS | Status: AC
  Administered 2017-03-02: 1000 mg via INTRAVENOUS
  Filled 2017-03-02: qty 10

## 2017-03-02 MED ORDER — LORAZEPAM 0.5 MG PO TABS
0.5000 mg | ORAL_TABLET | Freq: Every day | ORAL | Status: DC
  Administered 2017-03-03 – 2017-03-05 (×3): 0.5 mg via ORAL
  Filled 2017-03-02 (×3): qty 1

## 2017-03-02 MED ORDER — METOPROLOL TARTRATE 12.5 MG HALF TABLET: 12.5000 mg | ORAL_TABLET | Freq: Every day | ORAL | Status: DC | PRN

## 2017-03-02 MED ORDER — OXYCODONE HCL 5 MG PO TABS
5.0000 mg | ORAL_TABLET | ORAL | Status: DC | PRN
  Administered 2017-03-02: 5 mg via ORAL
  Administered 2017-03-02 – 2017-03-04 (×7): 10 mg via ORAL
  Filled 2017-03-02 (×7): qty 2

## 2017-03-02 MED ORDER — ACETAMINOPHEN 500 MG PO TABS
ORAL_TABLET | ORAL | Status: AC
  Administered 2017-03-02: 1000 mg
  Filled 2017-03-02: qty 2

## 2017-03-02 MED ORDER — PHENYLEPHRINE 40 MCG/ML (10ML) SYRINGE FOR IV PUSH (FOR BLOOD PRESSURE SUPPORT)
PREFILLED_SYRINGE | INTRAVENOUS | Status: AC
  Filled 2017-03-02: qty 30

## 2017-03-02 MED ORDER — IRBESARTAN 300 MG PO TABS
300.0000 mg | ORAL_TABLET | Freq: Every day | ORAL | Status: DC
  Administered 2017-03-02 – 2017-03-05 (×4): 300 mg via ORAL
  Filled 2017-03-02 (×4): qty 1

## 2017-03-02 MED ORDER — MIDAZOLAM HCL 2 MG/2ML IJ SOLN
INTRAMUSCULAR | Status: AC
  Administered 2017-03-02: 1 mg
  Filled 2017-03-02: qty 2

## 2017-03-02 MED ORDER — OXYCODONE-ACETAMINOPHEN 5-325 MG PO TABS: 1.0000 | ORAL_TABLET | ORAL | 0 refills | Status: DC | PRN

## 2017-03-02 MED ORDER — 0.9 % SODIUM CHLORIDE (POUR BTL) OPTIME
TOPICAL | Status: DC | PRN
  Administered 2017-03-02: 1000 mL

## 2017-03-02 MED ORDER — LIDOCAINE 2% (20 MG/ML) 5 ML SYRINGE
INTRAMUSCULAR | Status: DC | PRN
  Administered 2017-03-02: 30 mg via INTRAVENOUS

## 2017-03-02 MED ORDER — ACETAMINOPHEN 650 MG RE SUPP: 650.0000 mg | Freq: Four times a day (QID) | RECTAL | Status: DC | PRN

## 2017-03-02 MED ORDER — FENTANYL CITRATE (PF) 100 MCG/2ML IJ SOLN
INTRAMUSCULAR | Status: AC
  Administered 2017-03-02: 100 ug
  Filled 2017-03-02: qty 2

## 2017-03-02 MED ORDER — LEVOTHYROXINE SODIUM 25 MCG PO TABS
12.5000 ug | ORAL_TABLET | Freq: Every day | ORAL | Status: DC
  Administered 2017-03-03 – 2017-03-05 (×3): 12.5 ug via ORAL
  Filled 2017-03-02 (×3): qty 1

## 2017-03-02 MED ORDER — HYDROMORPHONE HCL 1 MG/ML IJ SOLN
0.2500 mg | INTRAMUSCULAR | Status: DC | PRN
Start: 2017-03-02 — End: 2017-03-02
  Administered 2017-03-02: 0.5 mg via INTRAVENOUS

## 2017-03-02 MED ORDER — ONDANSETRON HCL 4 MG/2ML IJ SOLN
INTRAMUSCULAR | Status: AC
  Filled 2017-03-02: qty 2

## 2017-03-02 MED ORDER — AMLODIPINE BESYLATE 5 MG PO TABS
5.0000 mg | ORAL_TABLET | Freq: Every day | ORAL | Status: DC
  Administered 2017-03-03 – 2017-03-05 (×3): 5 mg via ORAL
  Filled 2017-03-02 (×3): qty 1

## 2017-03-02 MED ORDER — METHOCARBAMOL 500 MG PO TABS: 500.0000 mg | ORAL_TABLET | Freq: Three times a day (TID) | ORAL | 1 refills | Status: DC | PRN

## 2017-03-02 MED ORDER — GABAPENTIN 300 MG PO CAPS
ORAL_CAPSULE | ORAL | Status: AC
  Administered 2017-03-02: 300 mg
  Filled 2017-03-02: qty 1

## 2017-03-02 MED ORDER — ROSUVASTATIN CALCIUM 5 MG PO TABS
5.0000 mg | ORAL_TABLET | Freq: Every evening | ORAL | Status: DC
  Administered 2017-03-02 – 2017-03-04 (×3): 5 mg via ORAL
  Filled 2017-03-02 (×3): qty 1

## 2017-03-02 MED ORDER — PHENOL 1.4 % MT LIQD
1.0000 | OROMUCOSAL | Status: DC | PRN
Start: 2017-03-02 — End: 2017-03-05

## 2017-03-02 MED ORDER — METHOCARBAMOL 500 MG PO TABS
500.0000 mg | ORAL_TABLET | Freq: Four times a day (QID) | ORAL | Status: DC | PRN
  Administered 2017-03-02 – 2017-03-05 (×5): 500 mg via ORAL
  Filled 2017-03-02 (×4): qty 1

## 2017-03-02 MED ORDER — MIDAZOLAM HCL 2 MG/2ML IJ SOLN
INTRAMUSCULAR | Status: AC
  Filled 2017-03-02: qty 2

## 2017-03-02 MED ORDER — HYDROMORPHONE HCL 1 MG/ML IJ SOLN
1.0000 mg | INTRAMUSCULAR | Status: DC | PRN
  Administered 2017-03-02 – 2017-03-03 (×7): 1 mg via INTRAVENOUS
  Filled 2017-03-02 (×7): qty 1

## 2017-03-02 MED ORDER — SODIUM CHLORIDE 0.9 % IV SOLN
INTRAVENOUS | Status: DC
  Administered 2017-03-02: 15:00:00 via INTRAVENOUS

## 2017-03-02 MED ORDER — OXYCODONE HCL 5 MG PO TABS
ORAL_TABLET | ORAL | Status: AC
  Filled 2017-03-02: qty 1

## 2017-03-02 MED ORDER — ACETAMINOPHEN 325 MG PO TABS
650.0000 mg | ORAL_TABLET | Freq: Four times a day (QID) | ORAL | Status: DC | PRN
  Administered 2017-03-05: 650 mg via ORAL
  Filled 2017-03-02: qty 2

## 2017-03-02 MED ORDER — ROPIVACAINE HCL 5 MG/ML IJ SOLN
INTRAMUSCULAR | Status: DC | PRN
  Administered 2017-03-02: 30 mL via PERINEURAL

## 2017-03-02 MED ORDER — METOCLOPRAMIDE HCL 5 MG/ML IJ SOLN
5.0000 mg | Freq: Three times a day (TID) | INTRAMUSCULAR | Status: DC | PRN
  Administered 2017-03-02 – 2017-03-03 (×3): 10 mg via INTRAVENOUS
  Filled 2017-03-02 (×3): qty 2

## 2017-03-02 MED ORDER — ONDANSETRON HCL 4 MG/2ML IJ SOLN
4.0000 mg | Freq: Four times a day (QID) | INTRAMUSCULAR | Status: DC | PRN
  Administered 2017-03-02 – 2017-03-03 (×3): 4 mg via INTRAVENOUS
  Filled 2017-03-02 (×4): qty 2

## 2017-03-02 MED ORDER — FENTANYL CITRATE (PF) 250 MCG/5ML IJ SOLN
INTRAMUSCULAR | Status: AC
  Filled 2017-03-02: qty 5

## 2017-03-02 MED ORDER — ASPIRIN 81 MG PO CHEW: 81.0000 mg | CHEWABLE_TABLET | Freq: Two times a day (BID) | ORAL | 0 refills | Status: DC

## 2017-03-02 MED ORDER — FERROUS SULFATE 325 (65 FE) MG PO TABS
325.0000 mg | ORAL_TABLET | Freq: Three times a day (TID) | ORAL | Status: DC
  Administered 2017-03-02 – 2017-03-04 (×7): 325 mg via ORAL
  Filled 2017-03-02 (×8): qty 1

## 2017-03-02 MED ORDER — MIDAZOLAM HCL 5 MG/5ML IJ SOLN
INTRAMUSCULAR | Status: DC | PRN
  Administered 2017-03-02: 2 mg via INTRAVENOUS

## 2017-03-02 MED ORDER — HYDROCHLOROTHIAZIDE 25 MG PO TABS
25.0000 mg | ORAL_TABLET | Freq: Every day | ORAL | Status: DC
  Administered 2017-03-02 – 2017-03-05 (×4): 25 mg via ORAL
  Filled 2017-03-02 (×4): qty 1

## 2017-03-02 MED ORDER — BISACODYL 10 MG RE SUPP: 10.0000 mg | Freq: Every day | RECTAL | Status: DC | PRN

## 2017-03-02 MED ORDER — ASPIRIN 81 MG PO CHEW
81.0000 mg | CHEWABLE_TABLET | Freq: Two times a day (BID) | ORAL | Status: DC
  Administered 2017-03-02 – 2017-03-05 (×6): 81 mg via ORAL
  Filled 2017-03-02 (×6): qty 1

## 2017-03-02 MED ORDER — METHOCARBAMOL 500 MG PO TABS
ORAL_TABLET | ORAL | Status: AC
  Filled 2017-03-02: qty 1

## 2017-03-02 MED ORDER — LACTATED RINGERS IV SOLN
INTRAVENOUS | Status: DC
  Administered 2017-03-02 (×2): via INTRAVENOUS

## 2017-03-02 MED ORDER — DOCUSATE SODIUM 100 MG PO CAPS
100.0000 mg | ORAL_CAPSULE | Freq: Two times a day (BID) | ORAL | Status: DC
  Administered 2017-03-02 – 2017-03-05 (×6): 100 mg via ORAL
  Filled 2017-03-02 (×6): qty 1

## 2017-03-02 MED ORDER — SODIUM CHLORIDE 0.9 % IR SOLN
Status: DC | PRN
  Administered 2017-03-02: 3000 mL

## 2017-03-02 MED ORDER — METHOCARBAMOL 1000 MG/10ML IJ SOLN: 500.0000 mg | Freq: Four times a day (QID) | INTRAVENOUS | Status: DC | PRN

## 2017-03-02 MED ORDER — PROPOFOL 500 MG/50ML IV EMUL
INTRAVENOUS | Status: DC | PRN
Start: 2017-03-02 — End: 2017-03-02
  Administered 2017-03-02: 100 ug/kg/min via INTRAVENOUS

## 2017-03-02 MED ORDER — BUPIVACAINE IN DEXTROSE 0.75-8.25 % IT SOLN
INTRATHECAL | Status: DC | PRN
  Administered 2017-03-02: 15 mg via INTRATHECAL

## 2017-03-02 MED ORDER — CEFAZOLIN SODIUM-DEXTROSE 2-4 GM/100ML-% IV SOLN
2.0000 g | INTRAVENOUS | Status: AC
  Administered 2017-03-02: 2 g via INTRAVENOUS
  Filled 2017-03-02: qty 100

## 2017-03-02 MED ORDER — VITAMIN D 1000 UNITS PO TABS
1000.0000 [IU] | ORAL_TABLET | Freq: Every day | ORAL | Status: DC
  Administered 2017-03-02 – 2017-03-04 (×3): 1000 [IU] via ORAL
  Filled 2017-03-02 (×3): qty 1

## 2017-03-02 MED ORDER — POLYSACCHARIDE IRON COMPLEX 150 MG PO CAPS
150.0000 mg | ORAL_CAPSULE | Freq: Every day | ORAL | Status: DC
  Administered 2017-03-02 – 2017-03-04 (×3): 150 mg via ORAL
  Filled 2017-03-02 (×3): qty 1

## 2017-03-02 MED ORDER — LEVOTHYROXINE SODIUM 75 MCG PO TABS
150.0000 ug | ORAL_TABLET | Freq: Every day | ORAL | Status: DC
  Administered 2017-03-03 – 2017-03-05 (×3): 150 ug via ORAL
  Filled 2017-03-02 (×3): qty 2

## 2017-03-02 MED ORDER — METOCLOPRAMIDE HCL 5 MG PO TABS: 5.0000 mg | ORAL_TABLET | Freq: Three times a day (TID) | ORAL | Status: DC | PRN

## 2017-03-02 MED ORDER — ACETAMINOPHEN 500 MG PO TABS
500.0000 mg | ORAL_TABLET | Freq: Every day | ORAL | Status: DC
  Administered 2017-03-03 – 2017-03-05 (×3): 500 mg via ORAL
  Filled 2017-03-02 (×3): qty 1

## 2017-03-02 MED ORDER — HYDROMORPHONE HCL 1 MG/ML IJ SOLN
INTRAMUSCULAR | Status: AC
  Filled 2017-03-02: qty 0.5

## 2017-03-02 MED ORDER — PHENYLEPHRINE 40 MCG/ML (10ML) SYRINGE FOR IV PUSH (FOR BLOOD PRESSURE SUPPORT)
PREFILLED_SYRINGE | INTRAVENOUS | Status: DC | PRN
  Administered 2017-03-02: 80 ug via INTRAVENOUS
  Administered 2017-03-02 (×6): 40 ug via INTRAVENOUS
  Administered 2017-03-02 (×2): 80 ug via INTRAVENOUS

## 2017-03-02 MED ORDER — HYDROMORPHONE HCL 1 MG/ML IJ SOLN
INTRAMUSCULAR | Status: AC
  Administered 2017-03-02: 0.5 mg
  Filled 2017-03-02: qty 0.5

## 2017-03-02 MED ORDER — CELECOXIB 200 MG PO CAPS
ORAL_CAPSULE | ORAL | Status: AC
  Administered 2017-03-02: 200 mg
  Filled 2017-03-02: qty 1

## 2017-03-02 MED ORDER — ONDANSETRON HCL 4 MG PO TABS: 4.0000 mg | ORAL_TABLET | Freq: Four times a day (QID) | ORAL | Status: DC | PRN

## 2017-03-02 MED ORDER — PROPOFOL 1000 MG/100ML IV EMUL
INTRAVENOUS | Status: AC
  Filled 2017-03-02: qty 100

## 2017-03-02 SURGICAL SUPPLY — 64 items
BANDAGE ESMARK 6X9 LF (GAUZE/BANDAGES/DRESSINGS) ×1 IMPLANT
BLADE SAG 18X100X1.27 (BLADE) ×2 IMPLANT
BLADE SAW SGTL 13X75X1.27 (BLADE) ×2 IMPLANT
BLADE SAW SGTL 18X1.27X75 (BLADE) ×1 IMPLANT
BLADE SURG 10 STRL SS (BLADE) ×1 IMPLANT
BNDG CMPR 9X6 STRL LF SNTH (GAUZE/BANDAGES/DRESSINGS) ×1
BNDG CMPR MED 10X6 ELC LF (GAUZE/BANDAGES/DRESSINGS) ×1
BNDG ELASTIC 6X10 VLCR STRL LF (GAUZE/BANDAGES/DRESSINGS) ×2 IMPLANT
BNDG ESMARK 6X9 LF (GAUZE/BANDAGES/DRESSINGS) ×2
BNDG GAUZE ELAST 4 BULKY (GAUZE/BANDAGES/DRESSINGS) ×4 IMPLANT
BOWL SMART MIX CTS (DISPOSABLE) ×2 IMPLANT
CAP KNEE TOTAL 3 SIGMA ×1 IMPLANT
CEMENT HV SMART SET (Cement) ×4 IMPLANT
COVER SURGICAL LIGHT HANDLE (MISCELLANEOUS) ×2 IMPLANT
CUFF TOURNIQUET SINGLE 34IN LL (TOURNIQUET CUFF) ×1 IMPLANT
CUFF TOURNIQUET SINGLE 44IN (TOURNIQUET CUFF) IMPLANT
DRAPE EXTREMITY T 121X128X90 (DRAPE) ×2 IMPLANT
DRAPE HALF SHEET 40X57 (DRAPES) ×2 IMPLANT
DRAPE U-SHAPE 47X51 STRL (DRAPES) ×2 IMPLANT
DRSG ADAPTIC 3X8 NADH LF (GAUZE/BANDAGES/DRESSINGS) ×2 IMPLANT
DRSG PAD ABDOMINAL 8X10 ST (GAUZE/BANDAGES/DRESSINGS) ×3 IMPLANT
DURAPREP 26ML APPLICATOR (WOUND CARE) ×2 IMPLANT
ELECT CAUTERY BLADE 6.4 (BLADE) ×2 IMPLANT
ELECT REM PT RETURN 9FT ADLT (ELECTROSURGICAL) ×2
ELECTRODE REM PT RTRN 9FT ADLT (ELECTROSURGICAL) ×1 IMPLANT
GAUZE SPONGE 4X4 12PLY STRL (GAUZE/BANDAGES/DRESSINGS) ×2 IMPLANT
GLOVE BIOGEL PI ORTHO PRO 7.5 (GLOVE) ×1
GLOVE BIOGEL PI ORTHO PRO SZ7 (GLOVE) ×1
GLOVE BIOGEL PI ORTHO PRO SZ8 (GLOVE) ×1
GLOVE ORTHO TXT STRL SZ7.5 (GLOVE) ×2 IMPLANT
GLOVE PI ORTHO PRO STRL 7.5 (GLOVE) ×1 IMPLANT
GLOVE PI ORTHO PRO STRL SZ7 (GLOVE) ×1 IMPLANT
GLOVE PI ORTHO PRO STRL SZ8 (GLOVE) ×1 IMPLANT
GLOVE SURG ORTHO 8.5 STRL (GLOVE) ×2 IMPLANT
GOWN STRL REUS W/ TWL XL LVL3 (GOWN DISPOSABLE) ×3 IMPLANT
GOWN STRL REUS W/TWL XL LVL3 (GOWN DISPOSABLE) ×6
HANDPIECE INTERPULSE COAX TIP (DISPOSABLE) ×2
IMMOBILIZER KNEE 22 UNIV (SOFTGOODS) ×2 IMPLANT
KIT BASIN OR (CUSTOM PROCEDURE TRAY) ×2 IMPLANT
KIT MANIFOLD (MISCELLANEOUS) ×2 IMPLANT
KIT ROOM TURNOVER OR (KITS) ×2 IMPLANT
MANIFOLD NEPTUNE II (INSTRUMENTS) ×2 IMPLANT
NS IRRIG 1000ML POUR BTL (IV SOLUTION) ×2 IMPLANT
PACK TOTAL JOINT (CUSTOM PROCEDURE TRAY) ×2 IMPLANT
PAD ARMBOARD 7.5X6 YLW CONV (MISCELLANEOUS) ×4 IMPLANT
PIN STEINMAN FIXATION KNEE (PIN) ×2 IMPLANT
SET HNDPC FAN SPRY TIP SCT (DISPOSABLE) ×1 IMPLANT
STRIP CLOSURE SKIN 1/2X4 (GAUZE/BANDAGES/DRESSINGS) ×2 IMPLANT
SUCTION FRAZIER HANDLE 10FR (MISCELLANEOUS) ×1
SUCTION TUBE FRAZIER 10FR DISP (MISCELLANEOUS) ×1 IMPLANT
SUT MNCRL AB 3-0 PS2 18 (SUTURE) ×2 IMPLANT
SUT VIC AB 0 CT1 27 (SUTURE) ×4
SUT VIC AB 0 CT1 27XBRD ANBCTR (SUTURE) ×2 IMPLANT
SUT VIC AB 1 CT1 27 (SUTURE) ×6
SUT VIC AB 1 CT1 27XBRD ANBCTR (SUTURE) ×3 IMPLANT
SUT VIC AB 2-0 CT1 27 (SUTURE) ×4
SUT VIC AB 2-0 CT1 TAPERPNT 27 (SUTURE) ×2 IMPLANT
TOWEL OR 17X24 6PK STRL BLUE (TOWEL DISPOSABLE) ×2 IMPLANT
TOWEL OR 17X26 10 PK STRL BLUE (TOWEL DISPOSABLE) ×2 IMPLANT
TRAY CATH 16FR W/PLASTIC CATH (SET/KITS/TRAYS/PACK) IMPLANT
TRAY FOLEY W/METER SILVER 16FR (SET/KITS/TRAYS/PACK) ×1 IMPLANT
TUBE CONNECTING 12X1/4 (SUCTIONS) ×2 IMPLANT
WATER STERILE IRR 1000ML POUR (IV SOLUTION) ×2 IMPLANT
YANKAUER SUCT BULB TIP NO VENT (SUCTIONS) ×2 IMPLANT

## 2017-03-02 NOTE — Evaluation (Signed)
Physical Therapy Evaluation Patient Details Name: Troy Acosta MRN: [id number] DOB: 1951-05-05 Today's Date: 03/02/2017   History of Present Illness  pt is a 66 y/o male with pmh significant for PAF, HTN admitted for elective R TKA  Clinical Impression  Pt admitted with/for elective R TKA.  Supervision level with epidural..  Pt currently limited functionally due to the problems listed below.  (see problems list.)  Pt will benefit from PT to maximize function and safety to be able to get home safely with available assist.    Follow Up Recommendations DC plan and follow up therapy as arranged by surgeon    Equipment Recommendations  Rolling walker with 5" wheels    Recommendations for Other Services       Precautions / Restrictions Precautions Precautions: Knee Precaution Booklet Issued: Yes (comment) Required Braces or Orthoses: Knee Immobilizer - Right Knee Immobilizer - Right: On when out of bed or walking Restrictions Weight Bearing Restrictions: Yes RLE Weight Bearing: Weight bearing as tolerated      Mobility  Bed Mobility Overal bed mobility: Needs Assistance Bed Mobility: Supine to Sit;Sit to Supine     Supine to sit: Supervision Sit to supine: Supervision   General bed mobility comments: no assist, flat bed, some dizziness  Transfers Overall transfer level: Needs assistance Equipment used: Rolling walker (2 wheeled) Transfers: Sit to/from Stand Sit to Stand: Min guard         General transfer comment: cues for hand placement/safety  Ambulation/Gait Ambulation/Gait assistance: Supervision Ambulation Distance (Feet): 400 Feet Assistive device: Rolling walker (2 wheeled) Gait Pattern/deviations: Step-through pattern;Step-to pattern   Gait velocity interpretation: at or above normal speed for age/gender General Gait Details: steady gait pattern, use of immobilizer, pt states not needing KI today  Stairs            Wheelchair Mobility    Modified  Rankin (Stroke Patients Only)       Balance Overall balance assessment: No apparent balance deficits (not formally assessed)                                           Pertinent Vitals/Pain Pain Assessment: Faces Faces Pain Scale: Hurts little more Pain Location: R knee Pain Descriptors / Indicators: Sore Pain Intervention(s): Monitored during session;Premedicated before session    Home Living Family/patient expects to be discharged to:: Private residence Living Arrangements: Spouse/significant other Available Help at Discharge: Family;Available 24 hours/day (while needed) Type of Home: House Home Access: Stairs to enter Entrance Stairs-Rails: Right;Left Entrance Stairs-Number of Steps: 2 Home Layout: Two level;Able to live on main level with bedroom/bathroom Home Equipment: Bedside commode;Shower seat;Toilet riser      Prior Function Level of Independence: Independent         Comments: working     Journalist, newspaper        Extremity/Trunk Assessment   Upper Extremity Assessment Upper Extremity Assessment: Overall WFL for tasks assessed    Lower Extremity Assessment Lower Extremity Assessment: RLE deficits/detail;LLE deficits/detail RLE Deficits / Details: active knee flexion 110*, ext lag 2*,   not MMT, SLR with minimal lag LLE Deficits / Details: WFL    Cervical / Trunk Assessment Cervical / Trunk Assessment: Normal  Communication   Communication: No difficulties  Cognition Arousal/Alertness: Awake/alert Behavior During Therapy: WFL for tasks assessed/performed Overall Cognitive Status: Within Functional Limits for tasks assessed  General Comments      Exercises Total Joint Exercises Ankle Circles/Pumps: AROM;20 reps;Supine Quad Sets: AROM;Both;10 reps;Supine Gluteal Sets: AROM;Both;10 reps;Supine Short Arc Quad: AROM;Supine Heel Slides: AROM;Right;10 reps;Supine Hip  ABduction/ADduction: AROM;Right;10 reps;Supine Straight Leg Raises: AROM;Right;10 reps;Supine Goniometric ROM: ~110*   Assessment/Plan    PT Assessment Patient needs continued PT services  PT Problem List Decreased activity tolerance;Decreased range of motion;Decreased mobility;Decreased knowledge of use of DME;Pain       PT Treatment Interventions Gait training;Stair training;Functional mobility training;Therapeutic activities;Therapeutic exercise;Patient/family education    PT Goals (Current goals can be found in the Care Plan section)  Acute Rehab PT Goals Patient Stated Goal: back to work PT Goal Formulation: With patient Time For Goal Achievement: 03/09/17 Potential to Achieve Goals: Good    Frequency 7X/week   Barriers to discharge        Co-evaluation               AM-PAC PT "6 Clicks" Daily Activity  Outcome Measure Difficulty turning over in bed (including adjusting bedclothes, sheets and blankets)?: A Little Difficulty moving from lying on back to sitting on the side of the bed? : A Little Difficulty sitting down on and standing up from a chair with arms (e.g., wheelchair, bedside commode, etc,.)?: A Little Help needed moving to and from a bed to chair (including a wheelchair)?: A Little Help needed walking in hospital room?: A Little Help needed climbing 3-5 steps with a railing? : A Little 6 Click Score: 18    End of Session   Activity Tolerance: Patient tolerated treatment well Patient left: in bed;with call bell/phone within reach;with family/visitor present Nurse Communication: Mobility status PT Visit Diagnosis: Other abnormalities of gait and mobility (R26.89);Pain Pain - Right/Left: Right Pain - part of body: Knee    Time: 1730-1758 PT Time Calculation (min) (ACUTE ONLY): 28 min   Charges:   PT Evaluation $PT Eval Low Complexity: 1 Procedure PT Treatments $Gait Training: 8-22 mins   PT G Codes:        03/26/2017  Donnella Sham,  PT [phone]  (pager)  Tessie Fass Philopateer Strine 26-Mar-2017, 6:08 PM

## 2017-03-02 NOTE — Anesthesia Postprocedure Evaluation (Signed)
Anesthesia Post Note  Patient: Troy Acosta  Procedure(s) Performed: Procedure(s) (LRB): RIGHT TOTAL KNEE ARTHROPLASTY (Right)  Patient location during evaluation: PACU Anesthesia Type: Spinal Level of consciousness: awake and alert Pain management: pain level controlled Vital Signs Assessment: post-procedure vital signs reviewed and stable Respiratory status: spontaneous breathing and respiratory function stable Cardiovascular status: blood pressure returned to baseline and stable Postop Assessment: spinal receding Anesthetic complications: no       Last Vitals:  Vitals:   03/02/17 1356 03/02/17 1400  BP:    Pulse: 61   Resp: 15 18  Temp:      Last Pain:  Vitals:   03/02/17 1356  TempSrc:   PainSc: 10-Worst pain ever                 Gardy Montanari,W. EDMOND

## 2017-03-02 NOTE — Progress Notes (Signed)
Orthopedic Tech Progress Note Patient Details:  Troy Acosta 1951/05/20 [id number]  CPM Right Knee CPM Right Knee: On Right Knee Flexion (Degrees): 60 Right Knee Extension (Degrees): 0 Additional Comments: foot roll   [name] 03/02/2017, 2:07 PM

## 2017-03-02 NOTE — Anesthesia Procedure Notes (Signed)
Procedure Name: MAC Date/Time: 03/02/2017 10:27 AM Performed by: Orlie Dakin Pre-anesthesia Checklist: Patient identified, Emergency Drugs available, Suction available, Patient being monitored and Timeout performed Patient Re-evaluated:Patient Re-evaluated prior to inductionOxygen Delivery Method: Nasal cannula

## 2017-03-02 NOTE — Brief Op Note (Signed)
03/02/2017  12:54 PM  PATIENT:  [name]  66 y.o. male  PRE-OPERATIVE DIAGNOSIS:  Right knee end stage osteoarthritis  POST-OPERATIVE DIAGNOSIS:  Right knee end stage osteoarthritis  PROCEDURE:  Procedure(s): RIGHT TOTAL KNEE ARTHROPLASTY (Right) DePuy Sigma RP  SURGEON:  Surgeon(s) and Role:    [name], MD - Primary  PHYSICIAN ASSISTANT:   ASSISTANTS: Ventura Bruns, PA-C   ANESTHESIA:   regional and general  EBL:  Total I/O In: 1400 [I.V.:1400] Out: 225 [Urine:225]  BLOOD ADMINISTERED:none  DRAINS: none   LOCAL MEDICATIONS USED:  MARCAINE     SPECIMEN:  No Specimen  DISPOSITION OF SPECIMEN:  N/A  COUNTS:  YES  TOURNIQUET:   Total Tourniquet Time Documented: Thigh (Right) - 112 minutes Total: Thigh (Right) - 112 minutes   DICTATION: .Other Dictation: Dictation Number [id number]  PLAN OF CARE: Admit to inpatient   PATIENT DISPOSITION:  PACU - hemodynamically stable.   Delay start of Pharmacological VTE agent (>24hrs) due to surgical blood loss or risk of bleeding: no

## 2017-03-02 NOTE — Progress Notes (Signed)
CSW c/s for SNF placement received.  Pt to return home with [place], noted.  No other social work needs noted.  CSW signing off.  Creta Levin, LCSW Evening/ED Coverage [phone]

## 2017-03-02 NOTE — Anesthesia Preprocedure Evaluation (Addendum)
Anesthesia Evaluation  Patient identified by MRN, date of birth, ID band Patient awake    Reviewed: Allergy & Precautions, H&P , NPO status , Patient's Chart, lab work & pertinent test results  Airway Mallampati: II  TM Distance: >3 FB Neck ROM: Full    Dental no notable dental hx. (+) Upper Dentures, Lower Dentures, Dental Advisory Given   Pulmonary neg pulmonary ROS, former smoker,    Pulmonary exam normal breath sounds clear to auscultation       Cardiovascular hypertension, Pt. on medications + dysrhythmias  Rhythm:Regular Rate:Normal     Neuro/Psych Anxiety negative psych ROS   GI/Hepatic negative GI ROS, Neg liver ROS,   Endo/Other  Hypothyroidism   Renal/GU negative Renal ROS  negative genitourinary   Musculoskeletal  (+) Arthritis , Osteoarthritis,    Abdominal   Peds  Hematology negative hematology ROS (+)   Anesthesia Other Findings   Reproductive/Obstetrics negative OB ROS                           Anesthesia Physical Anesthesia Plan  ASA: II  Anesthesia Plan: Spinal   Post-op Pain Management:  Regional for Post-op pain   Induction: Intravenous  Airway Management Planned: Simple Face Mask  Additional Equipment:   Intra-op Plan:   Post-operative Plan:   Informed Consent: I have reviewed the patients History and Physical, chart, labs and discussed the procedure including the risks, benefits and alternatives for the proposed anesthesia with the patient or authorized representative who has indicated his/her understanding and acceptance.   Dental advisory given  Plan Discussed with: CRNA, Anesthesiologist and Surgeon  Anesthesia Plan Comments:        Anesthesia Quick Evaluation

## 2017-03-02 NOTE — Anesthesia Procedure Notes (Signed)
Spinal  Patient location during procedure: OR Start time: 03/02/2017 10:21 AM End time: 03/02/2017 10:25 AM Staffing Anesthesiologist: Roderic Palau Performed: anesthesiologist  Preanesthetic Checklist Completed: patient identified, surgical consent, pre-op evaluation, timeout performed, IV checked, risks and benefits discussed and monitors and equipment checked Spinal Block Patient position: sitting Prep: DuraPrep Patient monitoring: cardiac monitor, continuous pulse ox and blood pressure Approach: midline Location: L3-4 Injection technique: single-shot Needle Needle type: Pencan  Needle gauge: 24 G Needle length: 9 cm Assessment Sensory level: T8 Additional Notes Functioning IV was confirmed and monitors were applied. Sterile prep and drape, including hand hygiene and sterile gloves were used. The patient was positioned and the spine was prepped. The skin was anesthetized with lidocaine.  Free flow of clear CSF was obtained prior to injecting local anesthetic into the CSF.  The spinal needle aspirated freely following injection.  The needle was carefully withdrawn.  The patient tolerated the procedure well.

## 2017-03-02 NOTE — Discharge Instructions (Signed)
Ice to the knee constantly.  Elevate the leg by propping under the ankle or distal calf NOT under the knee.  Weight bearing as tolerated on the right leg.  Keep incision clean and dry and covered for one week, then ok to get wet in the shower  Do exercises every hour.    Sleep in the knee brace at night to maintain knee extension.  Follow up in two weeks in the office [phone]

## 2017-03-02 NOTE — Transfer of Care (Signed)
Immediate Anesthesia Transfer of Care Note  Patient: Troy Acosta  Procedure(s) Performed: Procedure(s): RIGHT TOTAL KNEE ARTHROPLASTY (Right)  Patient Location: PACU  Anesthesia Type:Spinal  Level of Consciousness: awake, alert  and oriented  Airway & Oxygen Therapy: Patient Spontanous Breathing and Patient connected to nasal cannula oxygen  Post-op Assessment: Report given to RN and Post -op Vital signs reviewed and stable  Post vital signs: Reviewed and stable  Last Vitals:  Vitals:   03/02/17 0806  BP: 138/79  Pulse: 64  Resp: 18  Temp: 36.7 C    Last Pain:  Vitals:   03/02/17 0806  TempSrc: Oral         Complications: No apparent anesthesia complications

## 2017-03-02 NOTE — Interval H&P Note (Signed)
History and Physical Interval Note:  03/02/2017 9:51 AM  Troy Acosta  has presented today for surgery, with the diagnosis of Right knee end stage osteoarthritis  The various methods of treatment have been discussed with the patient and family. After consideration of risks, benefits and other options for treatment, the patient has consented to  Procedure(s): RIGHT TOTAL KNEE ARTHROPLASTY (Right) as a surgical intervention .  The patient's history has been reviewed, patient examined, no change in status, stable for surgery.  I have reviewed the patient's chart and labs.  Questions were answered to the patient's satisfaction.     Jeena Arnett,STEVEN R

## 2017-03-02 NOTE — Anesthesia Procedure Notes (Signed)
Anesthesia Regional Block: Adductor canal block   Pre-Anesthetic Checklist: ,, timeout performed, Correct Patient, Correct Site, Correct Laterality, Correct Procedure, Correct Position, site marked, Risks and benefits discussed, pre-op evaluation,  At surgeon's request and post-op pain management  Laterality: Right  Prep: Maximum Sterile Barrier Precautions used, chloraprep       Needles:  Injection technique: Single-shot  Needle Type: Echogenic Stimulator Needle     Needle Length: 9cm  Needle Gauge: 21     Additional Needles:   Procedures: ultrasound guided,,,,,,,,  Narrative:  Start time: 03/02/2017 9:06 AM End time: 03/02/2017 9:16 AM Injection made incrementally with aspirations every 5 mL. Anesthesiologist: Roderic Palau  Additional Notes: 2% Lidocaine skin wheel.

## 2017-03-02 NOTE — Care Management Note (Signed)
Case Management Note  Patient Details  Name: Troy Acosta MRN: [id number] Date of Birth: 08-02-1951  Subjective/Objective:   66 yr old gentleman admitted with osteoarthritis of the right knee,s/p right total knee arthroplasty on 03/02/17.                 Action/Plan: Case manager spoke with patient and his wife concerning discharge plan and DME needs. Patient was preoperatively setup with [facility], no changes. [name] says they already have a rolling walker and 3in1. CM explained that CPM will be delivered to their home on day of discharge. Patient will have family support at discharge.   Expected Discharge Date:    03/04/17           Expected Discharge Plan:  [place]  In-House Referral:  NA  Discharge planning Services  CM Consult  Post Acute Care Choice:  Durable Medical Equipment, Home Health Choice offered to:  Patient, Spouse  DME Arranged:  CPM (has RW and 3in1) DME Agency:  [employer]  HH Arranged:  PT [place]:  [facility] (formerly [employer])  Status of Service:  Completed, signed off  If discussed at [employer], dates discussed:    Additional Comments:  Ninfa Meeker, RN 03/02/2017, 3:25 PM

## 2017-03-03 ENCOUNTER — Encounter (HOSPITAL_COMMUNITY): Payer: Self-pay

## 2017-03-03 LAB — BASIC METABOLIC PANEL
ANION GAP: 7 (ref 5–15)
BUN: 15 mg/dL (ref 6–20)
CHLORIDE: 100 mmol/L — AB (ref 101–111)
CO2: 29 mmol/L (ref 22–32)
Calcium: 8.6 mg/dL — ABNORMAL LOW (ref 8.9–10.3)
Creatinine, Ser: 0.96 mg/dL (ref 0.61–1.24)
GFR calc Af Amer: >60 mL/min (ref >60)
Glucose, Bld: 122 mg/dL — ABNORMAL HIGH (ref 65–99)
POTASSIUM: 4 mmol/L (ref 3.5–5.1)
SODIUM: 136 mmol/L (ref 135–145)

## 2017-03-03 LAB — CBC
HCT: 41.5 % (ref 39.0–52.0)
HEMOGLOBIN: 13.3 g/dL (ref 13.0–17.0)
MCH: 28.7 pg (ref 26.0–34.0)
MCHC: 32 g/dL (ref 30.0–36.0)
MCV: 89.6 fL (ref 78.0–100.0)
Platelets: 163 10*3/uL (ref 150–400)
RBC: 4.63 MIL/uL (ref 4.22–5.81)
RDW: 13.5 % (ref 11.5–15.5)
WBC: 11.6 10*3/uL — AB (ref 4.0–10.5)

## 2017-03-03 MED ORDER — HYDROMORPHONE HCL 2 MG PO TABS: 2.0000 mg | ORAL_TABLET | ORAL | 0 refills | Status: DC | PRN

## 2017-03-03 MED ORDER — HYDROMORPHONE HCL 2 MG PO TABS
2.0000 mg | ORAL_TABLET | ORAL | Status: DC | PRN
  Administered 2017-03-03 – 2017-03-05 (×12): 4 mg via ORAL
  Filled 2017-03-03 (×12): qty 2

## 2017-03-03 NOTE — Evaluation (Signed)
Occupational Therapy Evaluation Patient Details Name: Troy Acosta MRN: [id number] DOB: 1951/06/11 Today's Date: 03/03/2017    History of Present Illness pt is a 66 y/o male with pmh significant for PAF, HTN admitted for elective R TKA   Clinical Impression   PTA, pt was independent and living with his wife. Pt currently performing ADLs and functional mobility with Min guard A-supervision with RW. Provided all education and answered pt's questions. Recommend dc home once medically stable per physician. All acute OT needs met. Will sign off.     Follow Up Recommendations  No OT follow up;Supervision/Assistance - 24 hour    Equipment Recommendations  None recommended by OT    Recommendations for Other Services       Precautions / Restrictions Precautions Precautions: Knee Precaution Comments: Reviewed resting with knee in extension Required Braces or Orthoses: Knee Immobilizer - Right Knee Immobilizer - Right: On when out of bed or walking Restrictions Weight Bearing Restrictions: Yes RLE Weight Bearing: Weight bearing as tolerated      Mobility Bed Mobility Overal bed mobility: Needs Assistance Bed Mobility: Supine to Sit;Sit to Supine     Supine to sit: Supervision Sit to supine: Supervision   General bed mobility comments: no assist, flat bed, some dizziness  Transfers Overall transfer level: Needs assistance Equipment used: Rolling walker (2 wheeled) Transfers: Sit to/from Stand Sit to Stand: Min guard         General transfer comment: cues for hand placement/safety    Balance Overall balance assessment: No apparent balance deficits (not formally assessed)                                         ADL either performed or assessed with clinical judgement   ADL Overall ADL's : Needs assistance/impaired                                       General ADL Comments: Pt educated on LB dressing, toilet transfer, and walk-in shower  transfer. Pt performing ADLs and fucntional mobility with Min guard-supervision using RW     Vision         Perception     Praxis      Pertinent Vitals/Pain Pain Assessment: 0-10 Pain Score: 10-Worst pain ever Faces Pain Scale: Hurts little more Pain Location: R knee Pain Descriptors / Indicators: Sore Pain Intervention(s): Monitored during session;Ice applied     Hand Dominance Right   Extremity/Trunk Assessment Upper Extremity Assessment Upper Extremity Assessment: Overall WFL for tasks assessed   Lower Extremity Assessment Lower Extremity Assessment: Defer to PT evaluation RLE Deficits / Details: active knee flexion 110*, ext lag 2*,   not MMT, SLR with minimal lag LLE Deficits / Details: WFL   Cervical / Trunk Assessment Cervical / Trunk Assessment: Normal   Communication Communication Communication: No difficulties   Cognition Arousal/Alertness: Awake/alert Behavior During Therapy: WFL for tasks assessed/performed Overall Cognitive Status: Within Functional Limits for tasks assessed                                     General Comments  Wife present for session    Exercises     Shoulder Instructions      Home Living  Family/patient expects to be discharged to:: Private residence Living Arrangements: Spouse/significant other Available Help at Discharge: Family;Available 24 hours/day (while needed) Type of Home: House Home Access: Stairs to enter [employer] of Steps: 2 Entrance Stairs-Rails: Right;Left Home Layout: Two level;Able to live on main level with bedroom/bathroom     Bathroom Shower/Tub: Occupational psychologist: Standard Bathroom Accessibility: Yes   Home Equipment: Bedside commode;Shower seat;Toilet riser          Prior Functioning/Environment Level of Independence: Independent        Comments: working        OT Problem List: Decreased strength;Decreased activity tolerance;Decreased safety  awareness;Decreased knowledge of use of DME or AE;Decreased knowledge of precautions;Pain      OT Treatment/Interventions:      OT Goals(Current goals can be found in the care plan section) Acute Rehab OT Goals Patient Stated Goal: back to work OT Goal Formulation: With patient Time For Goal Achievement: 03/17/17 Potential to Achieve Goals: Good  OT Frequency:     Barriers to D/C:            Co-evaluation              AM-PAC PT "6 Clicks" Daily Activity     Outcome Measure Help from another person eating meals?: None Help from another person taking care of personal grooming?: None Help from another person toileting, which includes using toliet, bedpan, or urinal?: A Little Help from another person bathing (including washing, rinsing, drying)?: A Little Help from another person to put on and taking off regular upper body clothing?: None Help from another person to put on and taking off regular lower body clothing?: A Little 6 Click Score: 21   End of Session Equipment Utilized During Treatment: Gait belt;Rolling walker;Right knee immobilizer Nurse Communication: Mobility status;Weight bearing status  Activity Tolerance: Patient tolerated treatment well Patient left: in chair;with call bell/phone within reach;with family/visitor present  OT Visit Diagnosis: Unsteadiness on feet (R26.81);Muscle weakness (generalized) (M62.81);Pain Pain - Right/Left: Right Pain - part of body: Knee                Time: 8280-0349 OT Time Calculation (min): 30 min Charges:  OT General Charges $OT Visit: 1 Procedure OT Evaluation $OT Eval Low Complexity: 1 Procedure OT Treatments $Self Care/Home Management : 8-22 mins G-Codes:     [employer], OTR/L [place] 03/03/2017, 9:20 AM

## 2017-03-03 NOTE — Progress Notes (Signed)
Orthopedics Progress Note  Subjective: Patient reports severe pain in the right knee, poorly controlled with Percocet.  Objective:  Vitals:   03/02/17 2139 03/03/17 0005  BP: 112/72 109/67  Pulse: 70 73  Resp: 16 16  Temp: 98.6 F (37 C) 99 F (37.2 C)    General: Awake and alert  Musculoskeletal: right knee dressing CDI, able to do good quad set Neurovascularly intact  Lab Results  Component Value Date   WBC 11.6 (H) 03/03/2017   HGB 13.3 03/03/2017   HCT 41.5 03/03/2017   MCV 89.6 03/03/2017   PLT 163 03/03/2017       Component Value Date/Time   NA 136 03/03/2017 0348   NA 141 02/07/2017 1552   K 4.0 03/03/2017 0348   CL 100 (L) 03/03/2017 0348   CO2 29 03/03/2017 0348   GLUCOSE 122 (H) 03/03/2017 0348   BUN 15 03/03/2017 0348   BUN 16 02/07/2017 1552   CREATININE 0.96 03/03/2017 0348   CREATININE 1.02 12/08/2015 0931   CALCIUM 8.6 (L) 03/03/2017 0348   GFRNONAA >60 03/03/2017 0348   GFRAA >60 03/03/2017 0348    No results found for: INR, PROTIME  Assessment/Plan: POD #1 s/p Procedure(s): RIGHT TOTAL KNEE ARTHROPLASTY OOB, PT, OT, D/C planning DVT prophylaxis - aspirin and SCD/TED Will try po dilaudid  Doran Heater. Veverly Fells, MD 03/03/2017 6:40 AM

## 2017-03-03 NOTE — Progress Notes (Signed)
CM received call from [facility], who is providing CPM and [employer]a states he has all information to accomplish this); brent asks if [facility] will please deliver a rolling walker to room prior to discharge. CM notified [facility] to please deliver the rolling walker to room prior to discharge.

## 2017-03-03 NOTE — Progress Notes (Signed)
Physical Therapy Treatment Patient Details Name: Troy Acosta MRN: [id number] DOB: 1951/01/06 Today's Date: 03/03/2017    History of Present Illness pt is a 66 y/o male with pmh significant for PAF, HTN admitted for elective R TKA    PT Comments    Patients biggest limitation at this time is pain. He was able to ambulate 200' and tolerated exercises well. Patient was repositioned and had a slight improvement in pain. Therapy will follow up in the afternoon and review stairs if able. D/C plan remains appropriate.    Follow Up Recommendations  DC plan and follow up therapy as arranged by surgeon     Equipment Recommendations  Rolling walker with 5" wheels    Recommendations for Other Services       Precautions / Restrictions Precautions Precautions: Knee Precaution Comments: Reviewed resting with knee in extension Restrictions Weight Bearing Restrictions: Yes RLE Weight Bearing: Weight bearing as tolerated    Mobility  Bed Mobility                  Transfers Overall transfer level: Needs assistance Equipment used: Rolling walker (2 wheeled) Transfers: Sit to/from Stand Sit to Stand: Min guard         General transfer comment: Cuing to stand up tall. Patient reported syncope when standing with OT but no syncope at this time.   Ambulation/Gait Ambulation/Gait assistance: Supervision Ambulation Distance (Feet): 200 Feet Assistive device: Rolling walker (2 wheeled) Gait Pattern/deviations: Step-through pattern;Step-to pattern   Gait velocity interpretation: Below normal speed for age/gender General Gait Details: Slow but steady gait pattern. No syncope noted. Cuing for heel strike.. Distance decreased 2nd to increased pain compared to last treatment.    Stairs            Wheelchair Mobility    Modified Rankin (Stroke Patients Only)       Balance Overall balance assessment: Needs assistance Sitting-balance support: No upper extremity  supported Sitting balance-Leahy Scale: Good     Standing balance support: Bilateral upper extremity supported Standing balance-Leahy Scale: Poor Standing balance comment: required RW for balance                             Cognition Arousal/Alertness: Awake/alert Behavior During Therapy: WFL for tasks assessed/performed Overall Cognitive Status: Within Functional Limits for tasks assessed                                        Exercises Total Joint Exercises Ankle Circles/Pumps: AROM;20 reps;Supine Quad Sets: AROM;Both;10 reps;Supine Gluteal Sets: AROM;Both;10 reps;Supine Heel Slides: AROM;Right;10 reps;Supine (reviewed heel slides with balanket for PROM ) Goniometric ROM: 10-94 degrees     General Comments General comments (skin integrity, edema, etc.): Wife present for session      Pertinent Vitals/Pain Pain Assessment: 0-10 Pain Score: 10-Worst pain ever Faces Pain Scale: Hurts worst (nursing has medicated the patient as much as able ) Pain Location: R knee Pain Descriptors / Indicators: Sore Pain Intervention(s): Monitored during session;Ice applied;Premedicated before session;Utilized relaxation techniques    Home Living Family/patient expects to be discharged to:: Private residence Living Arrangements: Spouse/significant other Available Help at Discharge: Family;Available 24 hours/day (while needed) Type of Home: House Home Access: Stairs to enter Entrance Stairs-Rails: Right;Left Home Layout: Two level;Able to live on main level with bedroom/bathroom Home Equipment: Bedside commode;Shower seat;Toilet riser  Prior Function Level of Independence: Independent      Comments: working   PT Goals (current goals can now be found in the care plan section) Acute Rehab PT Goals Patient Stated Goal: back to work PT Goal Formulation: With patient Time For Goal Achievement: 03/09/17 Potential to Achieve Goals: Good    Frequency     7X/week      PT Plan Current plan remains appropriate    Co-evaluation              AM-PAC PT "6 Clicks" Daily Activity  Outcome Measure  Difficulty turning over in bed (including adjusting bedclothes, sheets and blankets)?: A Little Difficulty moving from lying on back to sitting on the side of the bed? : A Little Difficulty sitting down on and standing up from a chair with arms (e.g., wheelchair, bedside commode, etc,.)?: A Little Help needed moving to and from a bed to chair (including a wheelchair)?: A Little Help needed walking in hospital room?: A Little Help needed climbing 3-5 steps with a railing? : A Little 6 Click Score: 18    End of Session   Activity Tolerance: Patient tolerated treatment well Patient left: in bed;with call bell/phone within reach;with family/visitor present Nurse Communication: Mobility status PT Visit Diagnosis: Other abnormalities of gait and mobility (R26.89);Pain Pain - Right/Left: Right Pain - part of body: Knee     Time: 7185-5015 PT Time Calculation (min) (ACUTE ONLY): 34 min  Charges:  $Gait Training: 8-22 mins $Therapeutic Exercise: 8-22 mins                    G Codes:        Carney Living PT DPT  03/03/2017, 9:28 AM

## 2017-03-03 NOTE — Progress Notes (Signed)
Physical Therapy Treatment Patient Details Name: Troy Acosta MRN: [id number] DOB: 1951/10/02 Today's Date: 03/03/2017    History of Present Illness pt is a 66 y/o male with pmh significant for PAF, HTN admitted for elective R TKA    PT Comments    Patient is making good progress. He was able to safely go up and down steps. He also ambulated 200' after performing steps. He increased his gait velocity. From a PT standpoint he is safe to return home. He is concerned about his current pain level. Therapy will follow up for further gait training in the morning.   Follow Up Recommendations  DC plan and follow up therapy as arranged by surgeon     Equipment Recommendations  Rolling walker with 5" wheels    Recommendations for Other Services       Precautions / Restrictions Precautions Precautions: Knee Precaution Booklet Issued: Yes (comment) Precaution Comments: Reviewed resting with knee in extension Required Braces or Orthoses: Knee Immobilizer - Right Knee Immobilizer - Right: On when out of bed or walking Restrictions Weight Bearing Restrictions: Yes RLE Weight Bearing: Weight bearing as tolerated    Mobility  Bed Mobility                  Transfers Overall transfer level: Needs assistance Equipment used: Rolling walker (2 wheeled) Transfers: Sit to/from Stand Sit to Stand: Min guard         General transfer comment: Cuing to stand up tall. Patient reported syncope when standing with OT but no syncope at this time.   Ambulation/Gait Ambulation/Gait assistance: Supervision Ambulation Distance (Feet): 200 Feet Assistive device: Rolling walker (2 wheeled) Gait Pattern/deviations: Step-through pattern;Step-to pattern   Gait velocity interpretation: Below normal speed for age/gender (improved speed from last visit ) General Gait Details:  steady gait pattern. No syncope noted. Improved heel toe gait    Stairs Stairs: Yes   Stair Management: One rail  Right Number of Stairs: 10 General stair comments: Patient went up and down 5 steps 2x in the [facility] gym. He required only min cuing and no cuing on the second lap. He had no increase in pain or syncope. He was safe on the steps.   Wheelchair Mobility    Modified Rankin (Stroke Patients Only)       Balance Overall balance assessment: Needs assistance Sitting-balance support: No upper extremity supported Sitting balance-Leahy Scale: Good     Standing balance support: Bilateral upper extremity supported Standing balance-Leahy Scale: Poor Standing balance comment: required RW for balance                             Cognition Arousal/Alertness: Awake/alert Behavior During Therapy: WFL for tasks assessed/performed Overall Cognitive Status: Within Functional Limits for tasks assessed                                        Exercises Total Joint Exercises Goniometric ROM: 10-98    General Comments General comments (skin integrity, edema, etc.): wife and brother present for stair trainging.       Pertinent Vitals/Pain Pain Assessment: 0-10 Pain Score: 7  Pain Location: R knee Pain Descriptors / Indicators: Sore Pain Intervention(s): Limited activity within patient's tolerance;Monitored during session;Premedicated before session    Home Living  Prior Function            PT Goals (current goals can now be found in the care plan section) Acute Rehab PT Goals Patient Stated Goal: back to work PT Goal Formulation: With patient Time For Goal Achievement: 03/09/17 Potential to Achieve Goals: Good    Frequency    7X/week      PT Plan Current plan remains appropriate    Co-evaluation              AM-PAC PT "6 Clicks" Daily Activity  Outcome Measure  Difficulty turning over in bed (including adjusting bedclothes, sheets and blankets)?: A Little Difficulty moving from lying on back to sitting on the side  of the bed? : A Little Difficulty sitting down on and standing up from a chair with arms (e.g., wheelchair, bedside commode, etc,.)?: A Little Help needed moving to and from a bed to chair (including a wheelchair)?: A Little Help needed walking in hospital room?: A Little Help needed climbing 3-5 steps with a railing? : A Little 6 Click Score: 18    End of Session         PT Visit Diagnosis: Other abnormalities of gait and mobility (R26.89);Pain Pain - Right/Left: Right Pain - part of body: Knee     Time: 1440-1459 PT Time Calculation (min) (ACUTE ONLY): 19 min  Charges:  $Gait Training: 8-22 mins                    G Codes:          Carney Living PT DPT   03/03/2017, 4:49 PM

## 2017-03-03 NOTE — Progress Notes (Signed)
Orthopedic Tech Progress Note Patient Details:  Troy Acosta June 23, 1951 [id number]  Patient ID: [name], male   DOB: 10-15-1950, 66 y.o.   MRN: [id number] Pt will call when ready to get in cpm.  Karolee Stamps 03/03/2017, 6:07 AM

## 2017-03-04 LAB — CBC
HEMATOCRIT: 40.2 % (ref 39.0–52.0)
HEMOGLOBIN: 13 g/dL (ref 13.0–17.0)
MCH: 28.6 pg (ref 26.0–34.0)
MCHC: 32.3 g/dL (ref 30.0–36.0)
MCV: 88.4 fL (ref 78.0–100.0)
Platelets: 141 10*3/uL — ABNORMAL LOW (ref 150–400)
RBC: 4.55 MIL/uL (ref 4.22–5.81)
RDW: 13.5 % (ref 11.5–15.5)
WBC: 13.2 10*3/uL — ABNORMAL HIGH (ref 4.0–10.5)

## 2017-03-04 NOTE — Progress Notes (Signed)
Physical Therapy Treatment Patient Details Name: Troy Acosta MRN: [id number] DOB: 1951/08/05 Today's Date: 03/04/2017    History of Present Illness pt is a 66 y/o male with pmh significant for PAF, HTN admitted for elective R TKA    PT Comments    Pt cont's to make progress with mobility.  Increased ambulation distance.  Reviewed LE therex with pt tolerating well.  Encouraged to ambulate 1-2 more times today.  Patient safe to D/C from a mobility standpoint based on progression towards goals set on PT eval.     Follow Up Recommendations  DC plan and follow up therapy as arranged by surgeon     Equipment Recommendations  Rolling walker with 5" wheels    Recommendations for Other Services       Precautions / Restrictions Precautions Precautions: Knee Precaution Comments: Reviewed resting with knee in extension Required Braces or Orthoses: Knee Immobilizer - Right Knee Immobilizer - Right: On when out of bed or walking Restrictions RLE Weight Bearing: Weight bearing as tolerated    Mobility  Bed Mobility Overal bed mobility: Modified Independent Bed Mobility: Supine to Sit           General bed mobility comments: no assist, flat bed, some dizziness  Transfers Overall transfer level: Needs assistance Equipment used: Rolling walker (2 wheeled) Transfers: Sit to/from Stand Sit to Stand: Supervision         General transfer comment: cues for safe hand placement  Ambulation/Gait Ambulation/Gait assistance: Supervision Ambulation Distance (Feet): 350 Feet Assistive device: Rolling walker (2 wheeled) Gait Pattern/deviations: Step-through pattern;Antalgic     General Gait Details: cues for increased heel strike RLE. during stance phase.  relying heavily on RW with UE's due to pain.     Stairs            Wheelchair Mobility    Modified Rankin (Stroke Patients Only)       Balance                                            Cognition  Arousal/Alertness: Awake/alert Behavior During Therapy: WFL for tasks assessed/performed Overall Cognitive Status: Within Functional Limits for tasks assessed                                        Exercises Total Joint Exercises Ankle Circles/Pumps: AROM;Both;10 reps Quad Sets: AROM;Strengthening;Both;10 reps Hip ABduction/ADduction: AROM;Strengthening;Right;10 reps Straight Leg Raises: AROM;Strengthening;Right;10 reps Long Arc Quad: AROM;Strengthening;Right;10 reps Knee Flexion: AAROM;Right;10 reps;Seated    General Comments        Pertinent Vitals/Pain Pain Assessment: 0-10 Pain Score: 6  Pain Location: R knee Pain Descriptors / Indicators: Aching;Discomfort Pain Intervention(s): Limited activity within patient's tolerance;Monitored during session;Premedicated before session;Repositioned;Ice applied    Home Living                      Prior Function            PT Goals (current goals can now be found in the care plan section) Acute Rehab PT Goals Patient Stated Goal: back to work PT Goal Formulation: With patient Time For Goal Achievement: 03/09/17 Potential to Achieve Goals: Good    Frequency    7X/week      PT Plan Current plan remains appropriate  Co-evaluation              AM-PAC PT "6 Clicks" Daily Activity  Outcome Measure                   End of Session Equipment Utilized During Treatment: Gait belt Activity Tolerance: Patient tolerated treatment well Patient left: in chair;with call bell/phone within reach;with nursing/sitter in room;with family/visitor present Nurse Communication: Mobility status       Time: 3704-8889 PT Time Calculation (min) (ACUTE ONLY): 25 min  Charges:  $Gait Training: 8-22 mins $Therapeutic Exercise: 8-22 mins                    G CodesSarajane Marek, [place] [phone] 03/04/2017    Sena Hitch 03/04/2017, 12:38 PM

## 2017-03-04 NOTE — Progress Notes (Signed)
   Subjective: 2 Days Post-Op Procedure(s) (LRB): RIGHT TOTAL KNEE ARTHROPLASTY (Right) Patient reports pain as moderate and severe.  Still required IV meds this AM Plan is to go Home after hospital stay.  Objective: Vital signs in last 24 hours: Temp:  [99.7 F (37.6 C)-99.9 F (37.7 C)] 99.9 F (37.7 C) (05/27 0257) Pulse Rate:  [78-82] 78 (05/27 0257) Resp:  [18] 18 (05/26 2028) BP: (123-143)/(63-69) 140/69 (05/27 0257) SpO2:  [92 %-96 %] 92 % (05/27 0257)  Intake/Output from previous day:  Intake/Output Summary (Last 24 hours) at 03/04/17 0856 Last data filed at 03/04/17 0500  Gross per 24 hour  Intake              740 ml  Output                0 ml  Net              740 ml    Intake/Output this shift: No intake/output data recorded.  Labs:  Recent Labs  03/03/17 0348 03/04/17 0327  HGB 13.3 13.0    Recent Labs  03/03/17 0348 03/04/17 0327  WBC 11.6* 13.2*  RBC 4.63 4.55  HCT 41.5 40.2  PLT 163 141*    Recent Labs  03/03/17 0348  NA 136  K 4.0  CL 100*  CO2 29  BUN 15  CREATININE 0.96  GLUCOSE 122*  CALCIUM 8.6*   No results for input(s): LABPT, INR in the last 72 hours.  EXAM General - Patient is Alert and Appropriate Extremity - Neurologically intact Neurovascular intact Incision: dressing C/D/I No cellulitis present Compartment soft Dressing/Incision - clean, dry, no drainage, minimal swelling Motor Function - intact, moving foot and toes well on exam.   Past Medical History:  Diagnosis Date  . Anxiety   . Arteritis, unspecified ([place])   . Arthritis    hands, knees, feet  . Diaphragmatic hernia without mention of obstruction or gangrene   . Dysrhythmia    afib, ablation- 12/2015, no problems since, off blood thinner- 09/2016  . Essential hypertension, benign   . Headache    had MRI- was consulted with Dr. Jannifer Franklin, but the consult, headache has resolved.  . Hyperplasia of prostate   . Iron deficiency anemia, unspecified   .  Malaise and fatigue   . Obesity   . Other and unspecified hyperlipidemia   . Paroxysmal atrial fibrillation ([facility])   . Unspecified hypothyroidism     Assessment/Plan: 2 Days Post-Op Procedure(s) (LRB): RIGHT TOTAL KNEE ARTHROPLASTY (Right) Active Problems:   Status post total knee replacement, right   Up with therapy D/C IV fluids Plan for discharge tomorrow  Still requiring IV pain meds thus not ready for discharge today    [place] V 03/04/2017, 8:56 AM

## 2017-03-04 NOTE — Progress Notes (Signed)
Physical Therapy Treatment Patient Details Name: Troy Acosta MRN: [id number] DOB: [birth year] Today's Date: 03/04/2017    History of Present Illness pt is a 66 y/o male with pmh significant for PAF, HTN admitted for elective R TKA    PT Comments    Pt states he doesn't feel like he can move his RLE as well today- states yesterday he could do SLR's but can not complete today.  However, when completing LE Therex, patient able to perform SLR's without physical assist as well as all other LE exercises .  Amb 300' using rw with supervision.      Follow Up Recommendations  DC plan and follow up therapy as arranged by surgeon     Equipment Recommendations  Rolling walker with 5" wheels    Recommendations for Other Services       Precautions / Restrictions Precautions Precautions: Knee Precaution Comments: Reviewed resting with knee in extension Required Braces or Orthoses: Knee Immobilizer - Right Knee Immobilizer - Right: On when out of bed or walking Restrictions RLE Weight Bearing: Weight bearing as tolerated    Mobility  Bed Mobility Overal bed mobility: Modified Independent Bed Mobility: Supine to Sit           General bed mobility comments: no assist, flat bed, some dizziness  Transfers Overall transfer level: Needs assistance Equipment used: Rolling walker (2 wheeled) Transfers: Sit to/from Stand Sit to Stand: Supervision         General transfer comment: cues for safe hand placement  Ambulation/Gait Ambulation/Gait assistance: Supervision Ambulation Distance (Feet): 300 Feet Assistive device: Rolling walker (2 wheeled) Gait Pattern/deviations: Step-through pattern;Antalgic     General Gait Details: cues for increased heel strike RLE. during stance phase.  relying heavily on RW with UE's due to pain.     Stairs            Wheelchair Mobility    Modified Rankin (Stroke Patients Only)       Balance                                            Cognition Arousal/Alertness: Awake/alert Behavior During Therapy: WFL for tasks assessed/performed Overall Cognitive Status: Within Functional Limits for tasks assessed                                        Exercises Total Joint Exercises Ankle Circles/Pumps: AROM;Both;10 reps Quad Sets: AROM;Both;10 reps Hip ABduction/ADduction: AROM;Strengthening;Right;10 reps Straight Leg Raises: AROM;Strengthening;Right;10 reps Long Arc Quad: AROM;Strengthening;Right;10 reps Knee Flexion: AAROM;Right;10 reps;Seated    General Comments        Pertinent Vitals/Pain Pain Assessment: 0-10 Pain Score: 8  Pain Location: R knee Pain Descriptors / Indicators: Aching;Sore;Guarding Pain Intervention(s): Limited activity within patient's tolerance;Monitored during session;Repositioned;Ice applied    Home Living                      Prior Function            PT Goals (current goals can now be found in the care plan section) Acute Rehab PT Goals Patient Stated Goal: back to work PT Goal Formulation: With patient Time For Goal Achievement: 03/09/17 Potential to Achieve Goals: Good    Frequency    7X/week  PT Plan Current plan remains appropriate    Co-evaluation              AM-PAC PT "6 Clicks" Daily Activity  Outcome Measure                   End of Session Equipment Utilized During Treatment: Gait belt;Other (comment) (foam block placed at end of session for knee ext) Activity Tolerance: Patient tolerated treatment well Patient left: in chair;with call bell/phone within reach;with nursing/sitter in room;with family/visitor present Nurse Communication: Mobility status       Time: 0940-1000 PT Time Calculation (min) (ACUTE ONLY): 20 min  Charges:  $Gait Training: 8-22 mins                    G CodesSarajane Marek, [place] 03/04/2017    Sena Hitch 03/04/2017, 10:12 AM

## 2017-03-05 LAB — CBC
HEMATOCRIT: 37.3 % — AB (ref 39.0–52.0)
HEMOGLOBIN: 12.5 g/dL — AB (ref 13.0–17.0)
MCH: 29.3 pg (ref 26.0–34.0)
MCHC: 33.5 g/dL (ref 30.0–36.0)
MCV: 87.6 fL (ref 78.0–100.0)
Platelets: 146 10*3/uL — ABNORMAL LOW (ref 150–400)
RBC: 4.26 MIL/uL (ref 4.22–5.81)
RDW: 13.2 % (ref 11.5–15.5)
WBC: 11.3 10*3/uL — ABNORMAL HIGH (ref 4.0–10.5)

## 2017-03-05 NOTE — Progress Notes (Signed)
Physical Therapy Treatment Patient Details Name: Troy Acosta MRN: [id number] DOB: 05-25-1951 Today's Date: 03/05/2017    History of Present Illness pt is a 66 y/o male with pmh significant for PAF, HTN admitted for elective R TKA    PT Comments    Pt is making good progress towards his goals. Pt currently mod I for bed mobility, supervision for transfers and ambulation of 220 feet with RW and min guard for ascend/descend of 2 steps with RW. Pt requires skilled PT to progress his gait training and to improve LE strength and endurance after discharging to his home environment.    Follow Up Recommendations  DC plan and follow up therapy as arranged by surgeon     Equipment Recommendations  Rolling walker with 5" wheels    Recommendations for Other Services       Precautions / Restrictions Precautions Precautions: Knee Precaution Booklet Issued: Yes (comment) Precaution Comments: Reviewed resting with knee in extension Restrictions Weight Bearing Restrictions: Yes RLE Weight Bearing: Weight bearing as tolerated    Mobility  Bed Mobility Overal bed mobility: Modified Independent                Transfers Overall transfer level: Needs assistance Equipment used: Rolling walker (2 wheeled) Transfers: Sit to/from Stand Sit to Stand: Supervision         General transfer comment: good hand placement for power up and eccentric descent into recliner  Ambulation/Gait Ambulation/Gait assistance: Supervision Ambulation Distance (Feet): 220 Feet Assistive device: Rolling walker (2 wheeled) Gait Pattern/deviations: Step-through pattern;Antalgic Gait velocity: slowed Gait velocity interpretation: Below normal speed for age/gender General Gait Details: cues for increased heel strike RLE. steady, safe gait   Stairs Stairs: Yes   Stair Management: No rails;Step to pattern;Backwards;Forwards;With walker Number of Stairs: 2 General stair comments: pt able to teach back  sequencing to PT and safely ascend/descend steps   Wheelchair Mobility    Modified Rankin (Stroke Patients Only)       Balance Overall balance assessment: Needs assistance Sitting-balance support: Feet supported Sitting balance-Leahy Scale: Normal     Standing balance support: Single extremity supported;During functional activity;No upper extremity supported Standing balance-Leahy Scale: Good                              Cognition Arousal/Alertness: Awake/alert Behavior During Therapy: WFL for tasks assessed/performed Overall Cognitive Status: Within Functional Limits for tasks assessed                                        Exercises Total Joint Exercises Quad Sets: AROM;Strengthening;Both;10 reps Knee Flexion: AAROM;Right;10 reps;Seated Goniometric ROM: -4 to 90 degrees measured seated in recliner        Pertinent Vitals/Pain Pain Assessment: 0-10 Pain Score: 5  Pain Location: R knee Pain Descriptors / Indicators: Aching;Discomfort Pain Intervention(s): Monitored during session;Repositioned  VSS           PT Goals (current goals can now be found in the care plan section) Acute Rehab PT Goals Patient Stated Goal: back to work PT Goal Formulation: With patient Time For Goal Achievement: 03/09/17 Potential to Achieve Goals: Good    Frequency    7X/week      PT Plan Current plan remains appropriate       AM-PAC PT "6 Clicks" Daily Activity  Outcome Measure  Difficulty turning  over in bed (including adjusting bedclothes, sheets and blankets)?: A Little Difficulty moving from lying on back to sitting on the side of the bed? : A Little Difficulty sitting down on and standing up from a chair with arms (e.g., wheelchair, bedside commode, etc,.)?: A Little Help needed moving to and from a bed to chair (including a wheelchair)?: None Help needed walking in hospital room?: None Help needed climbing 3-5 steps with a railing? : A  Little 6 Click Score: 20    End of Session Equipment Utilized During Treatment: Gait belt Activity Tolerance: Patient tolerated treatment well Patient left: in chair;with call bell/phone within reach;with nursing/sitter in room;with family/visitor present Nurse Communication: Mobility status PT Visit Diagnosis: Other abnormalities of gait and mobility (R26.89);Muscle weakness (generalized) (M62.81);Pain Pain - Right/Left: Right Pain - part of body: Knee     Time: 6301-6010 PT Time Calculation (min) (ACUTE ONLY): 20 min  Charges:  $Gait Training: 8-22 mins                    G Codes:       Cuthbert Turton B. Migdalia Dk PT, DPT Acute Rehabilitation  [phone] Pager [phone]     [place] 03/05/2017, 1:10 PM

## 2017-03-05 NOTE — Progress Notes (Signed)
   Subjective: 3 Days Post-Op Procedure(s) (LRB): RIGHT TOTAL KNEE ARTHROPLASTY (Right) Patient reports pain as mild.  Still uncomfortable but much better than yesterday Plan is to go Home after hospital stay.  Objective: Vital signs in last 24 hours: Temp:  [99.9 F (37.7 C)-100.9 F (38.3 C)] 99.9 F (37.7 C) (05/28 0537) Pulse Rate:  [76-95] 76 (05/28 0537) Resp:  [18] 18 (05/28 0537) BP: (117-128)/(73-88) 128/73 (05/28 0537) SpO2:  [94 %-100 %] 97 % (05/28 0537)  Intake/Output from previous day:  Intake/Output Summary (Last 24 hours) at 03/05/17 0647 Last data filed at 03/04/17 1745  Gross per 24 hour  Intake              720 ml  Output                0 ml  Net              720 ml    Intake/Output this shift: No intake/output data recorded.  Labs:  Recent Labs  03/03/17 0348 03/04/17 0327  HGB 13.3 13.0    Recent Labs  03/03/17 0348 03/04/17 0327  WBC 11.6* 13.2*  RBC 4.63 4.55  HCT 41.5 40.2  PLT 163 141*    Recent Labs  03/03/17 0348  NA 136  K 4.0  CL 100*  CO2 29  BUN 15  CREATININE 0.96  GLUCOSE 122*  CALCIUM 8.6*   No results for input(s): LABPT, INR in the last 72 hours.  EXAM General - Patient is Alert, Appropriate and Oriented Extremity - Neurologically intact Neurovascular intact No cellulitis present Compartment soft Dressing/Incision - clean, dry, no drainage Motor Function - intact, moving foot and toes well on exam.   Past Medical History:  Diagnosis Date  . Anxiety   . Arteritis, unspecified ([place])   . Arthritis    hands, knees, feet  . Diaphragmatic hernia without mention of obstruction or gangrene   . Dysrhythmia    afib, ablation- 12/2015, no problems since, off blood thinner- 09/2016  . Essential hypertension, benign   . Headache    had MRI- was consulted with Dr. Jannifer Franklin, but the consult, headache has resolved.  . Hyperplasia of prostate   . Iron deficiency anemia, unspecified   . Malaise and fatigue   .  Obesity   . Other and unspecified hyperlipidemia   . Paroxysmal atrial fibrillation (HCC)   . Unspecified hypothyroidism     Assessment/Plan: 3 Days Post-Op Procedure(s) (LRB): RIGHT TOTAL KNEE ARTHROPLASTY (Right) Active Problems:   Status post total knee replacement, right   Discharge home with home health  DVT Prophylaxis - Aspirin Weight-Bearing as tolerated to right leg  Odis Wickey V 03/05/2017, 6:47 AM

## 2017-03-05 NOTE — Progress Notes (Signed)
Patient given discharge instructions and prescriptions. After going over instructions patient and wife had no further questions. CM set up home health and patient discharged to lobby with wife.

## 2017-03-06 ENCOUNTER — Encounter (HOSPITAL_COMMUNITY): Payer: Self-pay | Admitting: Orthopedic Surgery

## 2017-03-06 DIAGNOSIS — I10 Essential (primary) hypertension: Secondary | ICD-10-CM | POA: Diagnosis not present

## 2017-03-06 DIAGNOSIS — Z7982 Long term (current) use of aspirin: Secondary | ICD-10-CM | POA: Diagnosis not present

## 2017-03-06 DIAGNOSIS — E669 Obesity, unspecified: Secondary | ICD-10-CM | POA: Diagnosis not present

## 2017-03-06 DIAGNOSIS — Z87891 Personal history of nicotine dependence: Secondary | ICD-10-CM | POA: Diagnosis not present

## 2017-03-06 DIAGNOSIS — M1990 Unspecified osteoarthritis, unspecified site: Secondary | ICD-10-CM | POA: Diagnosis not present

## 2017-03-06 DIAGNOSIS — E785 Hyperlipidemia, unspecified: Secondary | ICD-10-CM | POA: Diagnosis not present

## 2017-03-06 DIAGNOSIS — Z96651 Presence of right artificial knee joint: Secondary | ICD-10-CM | POA: Diagnosis not present

## 2017-03-06 DIAGNOSIS — F419 Anxiety disorder, unspecified: Secondary | ICD-10-CM | POA: Diagnosis not present

## 2017-03-06 DIAGNOSIS — Z79891 Long term (current) use of opiate analgesic: Secondary | ICD-10-CM | POA: Diagnosis not present

## 2017-03-06 DIAGNOSIS — Z471 Aftercare following joint replacement surgery: Secondary | ICD-10-CM | POA: Diagnosis not present

## 2017-03-06 DIAGNOSIS — Z6834 Body mass index (BMI) 34.0-34.9, adult: Secondary | ICD-10-CM | POA: Diagnosis not present

## 2017-03-06 DIAGNOSIS — D509 Iron deficiency anemia, unspecified: Secondary | ICD-10-CM | POA: Diagnosis not present

## 2017-03-06 NOTE — Op Note (Signed)
NAME:  Troy Acosta, Troy Acosta                        ACCOUNT NO.:  MEDICAL RECORD NO.:  [id number]  LOCATION:                                 FACILITY:  PHYSICIAN:  Doran Heater. Veverly Fells, M.D.      DATE OF BIRTH:  DATE OF PROCEDURE:  03/02/2017 DATE OF DISCHARGE:                              OPERATIVE REPORT   PREOPERATIVE DIAGNOSIS:  Right knee end-stage osteoarthritis.  POSTOPERATIVE DIAGNOSIS:  Right knee end-stage osteoarthritis.  PROCEDURE PERFORMED:  Right total knee replacement using DePuy Sigma rotating platform prosthesis.  ATTENDING SURGEON:  Doran Heater. Veverly Fells, M.D.  ASSISTANT:  Abbott Pao. Dixon, PA-C, who scrubbed during the entire procedure and necessary for satisfactory completion of surgery.  ANESTHESIA:  Spinal anesthesia was used plus an adductor canal block.  ESTIMATED BLOOD LOSS:  Minimal.  FLUID REPLACEMENT:  1500 mL of crystalloid.  INSTRUMENT COUNTS:  Correct.  COMPLICATIONS:  There were no complications.  ANTIBIOTICS:  Perioperative antibiotics were given.  INDICATIONS:  The patient is a __________-year-old male, who presents with progressively worsening right knee pain secondary to end-stage arthritis.  The patient has had prior conservative management including injections, viscosupplementation, physical therapy, modification of activity, pain medications.  He has also had prior knee arthroscopy for treatment of meniscal tears.  The patient presents with progressive knee pain and functional limitations including pain at night and pain at rest.  He has bone-on-bone changes on MRI and presents for total knee arthroplasty.  Risks and benefits were discussed.  Informed consent obtained.  DESCRIPTION OF PROCEDURE:  After an adequate level of anesthesia achieved, the patient was positioned in the supine position.  All neurovascular structures were padded appropriately.  Sterile prep and drape performed of the right knee after placement of a nonsterile tourniquet on  the proximal thigh.  Once we had our sterile prep and drape performed, we did our time-out verifying correct patient, correct site.  We then elevated the leg and exsanguinated the limb using an Esmarch bandage, elevating the tourniquet to 325 mmHg.  We then placed the knee in flexion, performed a longitudinal midline knee incision with a #10 blade scalpel.  Dissection down through subcutaneous tissues using a #10 blade.  We identified the parapatellar tissues and performed a median parapatellar arthrotomy with a fresh #10 blade scalpel.  We divided the lateral patellofemoral ligaments, everting the patella.  We were able to enter the distal femur with a step-cut drill.  We placed the intramedullary resection guide and resected 10 mm off the right femur set on 5 degrees of valgus.  Once we had that resection done, we sized the femur from size 5 anterior down.  We then placed our 4-in-1 block and performed our anterior, posterior, and chamfer cuts with appropriate rotational setting, referencing off the posterior condyles. We were pleased with that cut, which was flushed along the anterior femoral cortex.  We then went ahead and removed ACL, PCL, and meniscal tissue, subluxed the tibia anteriorly and then performed a 90 degree perpendicular cut to the long axis of the tibia, actually taking away some of the posterior slope that the patient had natively creating  a minimal less than 2 degree posterior slope on our tibial cut.  We checked our gaps.  We went ahead and cut 2 more millimeters as __________ both in flexion and extension even after resecting posterior femoral condyles and removing additional soft tissue that was seemed to be tethering [place].  We went ahead and did a little soft tissue release along the proximal medial tibia and removed some spurs there to make sure that the MCL was not being overly tightened and creating a little bit tighter gap medially.  Two more millimeters off the  tibia gave [place] some symmetric gap on the flexion and extension.  Once, we had that performed, we finished our tibial preparation with a modular drill and keel punch.  We placed our size 5 tibia trial in place and then we went ahead and did our box cut for femur and placed our box cut guide and used an oscillating saw for that.  We used a posterior cruciate substituting prosthesis.  We impacted the femoral component 5 right and then placed a 10 poly in position reduced the knee with a nice little pop medially.  We had a nice stable knee.  We were able to get full extension.  We then resurfaced the patella going from a 27 mm thickness down to 16 mm thickness.  We used the patellar clamp to cut our patella, drilled our PEG holes for a 38 patella and then placed our 38 patellar button in place.  We able to range the knee with excellent patellar tracking and no-touch technique.  We removed all trial components, irrigated the knee, dried the knee well, and then cemented the components into place with DePuy high viscosity cement, cemented the tibia, and femur and placed the knee in extension with a 10 mm spacer for poly and trial and then placed the patellar button in place and used a patellar clamp until the cement was hard.  Once the cement was hardened, we removed excess cement with 0.25-inch curved osteotome.  We did a final inspection, lavaged the knee, selected the real size 5, 10 mm poly and inserted that in position and again reduced the knee with a nice little pop there medially indicating a nice snug fit.  We ranged the knee.  We were able to get full extension, both stable in flexion and extension, and then, we irrigated and closed the parapatellar arthrotomy with #1 Vicryl suture interrupted, followed by 2-0 Vicryl for subcutaneous closure, and 4-0 Monocryl for skin.  Steri-Strips applied followed by sterile dressing.  The patient tolerated the surgery well.     Doran Heater. Veverly Fells,  M.D.     SRN/MEDQ  D:  03/02/2017  T:  03/02/2017  Job:  [id number]

## 2017-03-07 DIAGNOSIS — M1990 Unspecified osteoarthritis, unspecified site: Secondary | ICD-10-CM | POA: Diagnosis not present

## 2017-03-07 DIAGNOSIS — F419 Anxiety disorder, unspecified: Secondary | ICD-10-CM | POA: Diagnosis not present

## 2017-03-07 DIAGNOSIS — Z79891 Long term (current) use of opiate analgesic: Secondary | ICD-10-CM | POA: Diagnosis not present

## 2017-03-07 DIAGNOSIS — Z471 Aftercare following joint replacement surgery: Secondary | ICD-10-CM | POA: Diagnosis not present

## 2017-03-07 DIAGNOSIS — Z87891 Personal history of nicotine dependence: Secondary | ICD-10-CM | POA: Diagnosis not present

## 2017-03-07 DIAGNOSIS — Z96651 Presence of right artificial knee joint: Secondary | ICD-10-CM | POA: Diagnosis not present

## 2017-03-07 DIAGNOSIS — I10 Essential (primary) hypertension: Secondary | ICD-10-CM | POA: Diagnosis not present

## 2017-03-07 DIAGNOSIS — Z6834 Body mass index (BMI) 34.0-34.9, adult: Secondary | ICD-10-CM | POA: Diagnosis not present

## 2017-03-07 DIAGNOSIS — D509 Iron deficiency anemia, unspecified: Secondary | ICD-10-CM | POA: Diagnosis not present

## 2017-03-07 DIAGNOSIS — Z7982 Long term (current) use of aspirin: Secondary | ICD-10-CM | POA: Diagnosis not present

## 2017-03-07 DIAGNOSIS — E785 Hyperlipidemia, unspecified: Secondary | ICD-10-CM | POA: Diagnosis not present

## 2017-03-07 DIAGNOSIS — E669 Obesity, unspecified: Secondary | ICD-10-CM | POA: Diagnosis not present

## 2017-03-09 DIAGNOSIS — D509 Iron deficiency anemia, unspecified: Secondary | ICD-10-CM | POA: Diagnosis not present

## 2017-03-09 DIAGNOSIS — M1990 Unspecified osteoarthritis, unspecified site: Secondary | ICD-10-CM | POA: Diagnosis not present

## 2017-03-09 DIAGNOSIS — Z6834 Body mass index (BMI) 34.0-34.9, adult: Secondary | ICD-10-CM | POA: Diagnosis not present

## 2017-03-09 DIAGNOSIS — E785 Hyperlipidemia, unspecified: Secondary | ICD-10-CM | POA: Diagnosis not present

## 2017-03-09 DIAGNOSIS — F419 Anxiety disorder, unspecified: Secondary | ICD-10-CM | POA: Diagnosis not present

## 2017-03-09 DIAGNOSIS — Z79891 Long term (current) use of opiate analgesic: Secondary | ICD-10-CM | POA: Diagnosis not present

## 2017-03-09 DIAGNOSIS — Z471 Aftercare following joint replacement surgery: Secondary | ICD-10-CM | POA: Diagnosis not present

## 2017-03-09 DIAGNOSIS — Z87891 Personal history of nicotine dependence: Secondary | ICD-10-CM | POA: Diagnosis not present

## 2017-03-09 DIAGNOSIS — E669 Obesity, unspecified: Secondary | ICD-10-CM | POA: Diagnosis not present

## 2017-03-09 DIAGNOSIS — Z7982 Long term (current) use of aspirin: Secondary | ICD-10-CM | POA: Diagnosis not present

## 2017-03-09 DIAGNOSIS — I10 Essential (primary) hypertension: Secondary | ICD-10-CM | POA: Diagnosis not present

## 2017-03-09 DIAGNOSIS — Z96651 Presence of right artificial knee joint: Secondary | ICD-10-CM | POA: Diagnosis not present

## 2017-03-12 DIAGNOSIS — I10 Essential (primary) hypertension: Secondary | ICD-10-CM | POA: Diagnosis not present

## 2017-03-12 DIAGNOSIS — Z7982 Long term (current) use of aspirin: Secondary | ICD-10-CM | POA: Diagnosis not present

## 2017-03-12 DIAGNOSIS — Z79891 Long term (current) use of opiate analgesic: Secondary | ICD-10-CM | POA: Diagnosis not present

## 2017-03-12 DIAGNOSIS — E785 Hyperlipidemia, unspecified: Secondary | ICD-10-CM | POA: Diagnosis not present

## 2017-03-12 DIAGNOSIS — M1990 Unspecified osteoarthritis, unspecified site: Secondary | ICD-10-CM | POA: Diagnosis not present

## 2017-03-12 DIAGNOSIS — Z471 Aftercare following joint replacement surgery: Secondary | ICD-10-CM | POA: Diagnosis not present

## 2017-03-12 DIAGNOSIS — D509 Iron deficiency anemia, unspecified: Secondary | ICD-10-CM | POA: Diagnosis not present

## 2017-03-12 DIAGNOSIS — Z6834 Body mass index (BMI) 34.0-34.9, adult: Secondary | ICD-10-CM | POA: Diagnosis not present

## 2017-03-12 DIAGNOSIS — E669 Obesity, unspecified: Secondary | ICD-10-CM | POA: Diagnosis not present

## 2017-03-12 DIAGNOSIS — Z87891 Personal history of nicotine dependence: Secondary | ICD-10-CM | POA: Diagnosis not present

## 2017-03-12 DIAGNOSIS — F419 Anxiety disorder, unspecified: Secondary | ICD-10-CM | POA: Diagnosis not present

## 2017-03-12 DIAGNOSIS — Z96651 Presence of right artificial knee joint: Secondary | ICD-10-CM | POA: Diagnosis not present

## 2017-03-12 NOTE — Discharge Summary (Signed)
Physician Discharge Summary   Patient ID: Troy Acosta MRN: [id number] DOB/AGE: 1951-04-24 66 y.o.  Admit date: 03/02/2017 Discharge date: 03/12/2017  Admission Diagnoses:  Active Problems:   Status post total knee replacement, right   Discharge Diagnoses:  Same   Surgeries: Procedure(s): RIGHT TOTAL KNEE ARTHROPLASTY on 03/02/2017   Consultants: OT, PT  Discharged Condition: Stable  Hospital Course: Troy Acosta is an 66 y.o. male who was admitted 03/02/2017 with a chief complaint of right knee pain, and found to have a diagnosis of right knee primary OA.  They were brought to the operating room on 03/02/2017 and underwent the above named procedures.    The patient had an uncomplicated hospital course and was stable for discharge.  Recent vital signs:  Vitals:   03/05/17 0400 03/05/17 0537  BP:  128/73  Pulse:  76  Resp:  18  Temp: 100.1 F (37.8 C) 99.9 F (37.7 C)    Recent laboratory studies:  Results for orders placed or performed during the hospital encounter of 03/02/17  CBC  Result Value Ref Range   WBC 11.6 (H) 4.0 - 10.5 K/uL   RBC 4.63 4.22 - 5.81 MIL/uL   Hemoglobin 13.3 13.0 - 17.0 g/dL   HCT 41.5 39.0 - 52.0 %   MCV 89.6 78.0 - 100.0 fL   MCH 28.7 26.0 - 34.0 pg   MCHC 32.0 30.0 - 36.0 g/dL   RDW 13.5 11.5 - 15.5 %   Platelets 163 150 - 400 K/uL  Basic metabolic panel  Result Value Ref Range   Sodium 136 135 - 145 mmol/L   Potassium 4.0 3.5 - 5.1 mmol/L   Chloride 100 (L) 101 - 111 mmol/L   CO2 29 22 - 32 mmol/L   Glucose, Bld 122 (H) 65 - 99 mg/dL   BUN 15 6 - 20 mg/dL   Creatinine, Ser 0.96 0.61 - 1.24 mg/dL   Calcium 8.6 (L) 8.9 - 10.3 mg/dL   GFR calc non Af Amer >60 >60 mL/min   GFR calc Af Amer >60 >60 mL/min   Anion gap 7 5 - 15  CBC  Result Value Ref Range   WBC 13.2 (H) 4.0 - 10.5 K/uL   RBC 4.55 4.22 - 5.81 MIL/uL   Hemoglobin 13.0 13.0 - 17.0 g/dL   HCT 40.2 39.0 - 52.0 %   MCV 88.4 78.0 - 100.0 fL   MCH 28.6 26.0 - 34.0 pg   MCHC  32.3 30.0 - 36.0 g/dL   RDW 13.5 11.5 - 15.5 %   Platelets 141 (L) 150 - 400 K/uL  CBC  Result Value Ref Range   WBC 11.3 (H) 4.0 - 10.5 K/uL   RBC 4.26 4.22 - 5.81 MIL/uL   Hemoglobin 12.5 (L) 13.0 - 17.0 g/dL   HCT 37.3 (L) 39.0 - 52.0 %   MCV 87.6 78.0 - 100.0 fL   MCH 29.3 26.0 - 34.0 pg   MCHC 33.5 30.0 - 36.0 g/dL   RDW 13.2 11.5 - 15.5 %   Platelets 146 (L) 150 - 400 K/uL    Discharge Medications:   Allergies as of 03/05/2017   No Known Allergies     Medication List    TAKE these medications   acetaminophen 500 MG tablet Commonly known as:  TYLENOL Take 500 mg by mouth daily.   amLODipine 5 MG tablet Commonly known as:  NORVASC Take 1 tablet (5 mg total) by mouth daily.   aspirin 81 MG chewable tablet  Commonly known as:  ASPIRIN CHILDRENS Chew 1 tablet (81 mg total) by mouth 2 (two) times daily.   hydrochlorothiazide 25 MG tablet Commonly known as:  HYDRODIURIL Take 1 tablet (25 mg total) by mouth daily.   HYDROmorphone 2 MG tablet Commonly known as:  DILAUDID Take 1-2 tablets (2-4 mg total) by mouth every 4 (four) hours as needed for severe pain.   iron polysaccharides 150 MG capsule Commonly known as:  FERREX 150 Take 1 capsule (150 mg total) by mouth daily. What changed:  when to take this   levothyroxine 25 MCG tablet Commonly known as:  SYNTHROID, LEVOTHROID TAKE ONE-HALF TABLET BY MOUTH ONCE DAILY BEFORE BREAKFAST What changed:  how much to take  how to take this  when to take this  additional instructions   levothyroxine 150 MCG tablet Commonly known as:  SYNTHROID, LEVOTHROID Take 1 tablet (150 mcg total) by mouth daily before breakfast. What changed:  Another medication with the same name was changed. Make sure you understand how and when to take each.   LORazepam 0.5 MG tablet Commonly known as:  ATIVAN TAKE ONE TABLET BY MOUTH ONCE DAILY AS NEEDED What changed:  how much to take  how to take this  when to take  this  additional instructions   methocarbamol 500 MG tablet Commonly known as:  ROBAXIN Take 1 tablet (500 mg total) by mouth 3 (three) times daily as needed.   metoprolol tartrate 25 MG tablet Commonly known as:  LOPRESSOR Take 12.5 mg by mouth daily as needed (Elevated heart rate).   olmesartan 40 MG tablet Commonly known as:  BENICAR Take 0.5 tablets (20 mg total) by mouth 2 (two) times daily.   oxyCODONE-acetaminophen 5-325 MG tablet Commonly known as:  ROXICET Take 1-2 tablets by mouth every 4 (four) hours as needed for severe pain.   rosuvastatin 5 MG tablet Commonly known as:  CRESTOR Take 1 tablet (5 mg total) by mouth daily. What changed:  when to take this   Vitamin D3 1000 units Caps Take 1,000 Units by mouth at bedtime.       Diagnostic Studies: Dg Knee Right Port  Result Date: 03/02/2017 CLINICAL DATA:  66 year old male status post right total knee arthroplasty. EXAM: PORTABLE RIGHT KNEE - 1-2 VIEW COMPARISON:  09/18/2013 knee radiographs. FINDINGS: AP and cross-table lateral views. Artifact from cooling blanket on both views limits some bone detail. Right total knee arthroplasty hardware is in place, appears intact, and normally aligned. No unexpected osseous changes identified. Postoperative changes to the surrounding soft tissues including mild subcutaneous gas. IMPRESSION: Right total knee arthroplasty with no adverse features identified. Electronically Signed   By: Genevie Ann M.D.   On: 03/02/2017 15:12    Disposition: 01-Home or Self Care    Follow-up Information    [name], MD. Call in 2 week(s).   Specialty:  Orthopedic Surgery Why:  [phone] Contact information: [address] [place] [address] [phone]        Home, [facility] Follow up.   Specialty:  [place] Why:  A representative from [facility] will contact you to arrange start date and time for your therapy. Contact information: [address] [place] [address] [phone]            Signed: Augustin Schooling 03/12/2017, 11:13 AM

## 2017-03-14 DIAGNOSIS — Z87891 Personal history of nicotine dependence: Secondary | ICD-10-CM | POA: Diagnosis not present

## 2017-03-14 DIAGNOSIS — D509 Iron deficiency anemia, unspecified: Secondary | ICD-10-CM | POA: Diagnosis not present

## 2017-03-14 DIAGNOSIS — E785 Hyperlipidemia, unspecified: Secondary | ICD-10-CM | POA: Diagnosis not present

## 2017-03-14 DIAGNOSIS — Z471 Aftercare following joint replacement surgery: Secondary | ICD-10-CM | POA: Diagnosis not present

## 2017-03-14 DIAGNOSIS — F419 Anxiety disorder, unspecified: Secondary | ICD-10-CM | POA: Diagnosis not present

## 2017-03-14 DIAGNOSIS — I10 Essential (primary) hypertension: Secondary | ICD-10-CM | POA: Diagnosis not present

## 2017-03-14 DIAGNOSIS — Z96651 Presence of right artificial knee joint: Secondary | ICD-10-CM | POA: Diagnosis not present

## 2017-03-14 DIAGNOSIS — Z6834 Body mass index (BMI) 34.0-34.9, adult: Secondary | ICD-10-CM | POA: Diagnosis not present

## 2017-03-14 DIAGNOSIS — Z79891 Long term (current) use of opiate analgesic: Secondary | ICD-10-CM | POA: Diagnosis not present

## 2017-03-14 DIAGNOSIS — E669 Obesity, unspecified: Secondary | ICD-10-CM | POA: Diagnosis not present

## 2017-03-14 DIAGNOSIS — Z7982 Long term (current) use of aspirin: Secondary | ICD-10-CM | POA: Diagnosis not present

## 2017-03-14 DIAGNOSIS — M1990 Unspecified osteoarthritis, unspecified site: Secondary | ICD-10-CM | POA: Diagnosis not present

## 2017-03-15 DIAGNOSIS — M1712 Unilateral primary osteoarthritis, left knee: Secondary | ICD-10-CM | POA: Diagnosis not present

## 2017-03-15 DIAGNOSIS — Z471 Aftercare following joint replacement surgery: Secondary | ICD-10-CM | POA: Diagnosis not present

## 2017-03-15 DIAGNOSIS — Z96651 Presence of right artificial knee joint: Secondary | ICD-10-CM | POA: Diagnosis not present

## 2017-03-21 ENCOUNTER — Encounter: Payer: Self-pay | Admitting: Physical Therapy

## 2017-03-21 ENCOUNTER — Ambulatory Visit: Payer: BLUE CROSS/BLUE SHIELD | Attending: Orthopedic Surgery | Admitting: Physical Therapy

## 2017-03-21 DIAGNOSIS — M25561 Pain in right knee: Secondary | ICD-10-CM | POA: Diagnosis not present

## 2017-03-21 DIAGNOSIS — R600 Localized edema: Secondary | ICD-10-CM | POA: Diagnosis not present

## 2017-03-21 DIAGNOSIS — M25661 Stiffness of right knee, not elsewhere classified: Secondary | ICD-10-CM | POA: Diagnosis not present

## 2017-03-21 NOTE — Therapy (Signed)
[facility] [place], [place], [address] Phone: [phone]   Fax:  [phone]  Physical Therapy Evaluation  Patient Details  Name: Troy Acosta MRN: [id number] Date of Birth: Dec 27, 1950 Referring Provider: Esmond Plants, MD  Encounter Date: 03/21/2017      PT End of Session - 03/21/17 1048    Visit Number 1   Number of Visits 20   Date for PT Re-Evaluation 05/21/17   PT Start Time 4268   PT Stop Time 1125   PT Time Calculation (min) 45 min   Activity Tolerance Patient tolerated treatment well   Behavior During Therapy [facility] for tasks assessed/performed      Past Medical History:  Diagnosis Date  . Anxiety   . Arteritis, unspecified ([place])   . Arthritis    hands, knees, feet  . Diaphragmatic hernia without mention of obstruction or gangrene   . Dysrhythmia    afib, ablation- 12/2015, no problems since, off blood thinner- 09/2016  . Essential hypertension, benign   . Headache    had MRI- was consulted with Dr. Jannifer Franklin, but the consult, headache has resolved.  . Hyperplasia of prostate   . Iron deficiency anemia, unspecified   . Malaise and fatigue   . Obesity   . Other and unspecified hyperlipidemia   . Paroxysmal atrial fibrillation (HCC)   . Unspecified hypothyroidism     Past Surgical History:  Procedure Laterality Date  . ELECTROPHYSIOLOGIC STUDY N/A 12/24/2015   Afib ablation by Dr Rayann Heman  . KNEE SURGERY Right   . TOTAL KNEE ARTHROPLASTY Right 03/02/2017   Procedure: RIGHT TOTAL KNEE ARTHROPLASTY;  Surgeon: [name], MD;  Location: [facility];  Service: Orthopedics;  Laterality: Right;    There were no vitals filed for this visit.       Subjective Assessment - 03/21/17 1043    Subjective Pt arriving to threapy following a total kneee replacement on 03/02/17. Pt reporting having 5 HHPT visits. Pt also reporting 6/10 R knee pain. Pt amb with antalgic gait using a straight cane.    How long can you sit  comfortably? 2 hours   How long can you stand comfortably? 15 minutes   How long can you walk comfortably? 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 Yes   Pain Score 6    Pain Location Knee   Pain Type Surgical pain   Pain Onset 1 to 4 weeks ago   Aggravating Factors  it's worse at night   Pain Relieving Factors pain meds, ice elevation   Effect of Pain on Daily Activities unable to sleep, difficulty walking and standing            [facility] PT Assessment - 03/21/17 0001      Assessment   Medical Diagnosis Right TKA   Referring Provider Esmond Plants, MD   Onset Date/Surgical Date 03/02/17   Hand Dominance Right   Next MD Visit 04/12/17   Prior Therapy yes, 5 HHPT visits following surgery     Precautions   Precautions None     Restrictions   Weight Bearing Restrictions No     Balance Screen   Has the patient fallen in the past 6 months No   Is the patient reluctant to leave their home because of a fear of falling?  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   Leisure gardening     Cognition   Overall Cognitive Status Within Functional Limits for tasks assessed     Observation/Other Assessments   Focus on Therapeutic Outcomes (FOTO)  61% limitation     Sensation   Additional Comments pt reporting some tingling but reports he had it prior to surgery     ROM / Strength   AROM / PROM  / Strength AROM;Strength     AROM   AROM Assessment Site Knee   Right/Left Knee Right;Left   Right Knee Extension 14   Right Knee Flexion 92   Left Knee Extension -2   Left Knee Flexion 134     Strength   Overall Strength Deficits   Overall Strength Comments L LE grossly 5/5   Strength Assessment Site Knee   Right/Left Knee Right   Right Knee Flexion 4/5   Right Knee Extension 3/5     Palpation   Palpation comment Pt with tenderness around incision site both medial and lateral     Ambulation/Gait   Ambulation/Gait Yes   Ambulation/Gait Assistance 6: Modified independent (Device/Increase time)   Ambulation Distance (Feet) 30 Feet   Assistive device Straight cane   Gait Pattern Decreased stance time - right;Decreased weight shift to right;Right flexed knee in stance;Antalgic;Poor foot clearance - right   Ambulation Surface Level;Indoor            Objective measurements completed on examination: See above findings.          [facility] Adult PT Treatment/Exercise - 03/21/17 0001      Exercises   Exercises Knee/Hip     Knee/Hip Exercises: Stretches   Active Hamstring Stretch Right;3 reps;30 seconds     Knee/Hip Exercises: Aerobic   Nustep L4 x 10 minutes     Knee/Hip Exercises: Supine   Quad Sets AROM;5 reps     Modalities   Modalities Vasopneumatic     Vasopneumatic   Number Minutes Vasopneumatic  15 minutes   Vasopnuematic Location  Knee   Vasopneumatic Pressure Medium   Vasopneumatic Temperature  34 degrees                PT Education - 03/21/17 1047    Education provided Yes   Education Details Home exercise review, added quad sets, standing hamstring curls, hamstring stretches, continue to ice and elevate   Person(s) Educated Patient   Methods Explanation;Demonstration   Comprehension Verbalized understanding;Returned demonstration          PT Short Term Goals - 03/21/17 1207      PT SHORT TERM GOAL #1   Title pt will be independent  with his initial HEP   Time 3   Period Weeks   Status New     PT SHORT TERM GOAL #2   Title Pt will improve right knee flexion to >100 degrees actively in order to improve amb.   Time 3  Period Weeks   Status New           PT Long Term Goals - 03/21/17 1208      PT LONG TERM GOAL #1   Title Pt will improve his FOTO score from 61% limitation to </= 40% limitation.   Time 8   Period Weeks   Status New     PT LONG TERM GOAL #2   Title Pt will be able to amb with no assistive device >/= 1000 feet on community surfaces.   Time 8   Period Weeks   Status New     PT LONG TERM GOAL #3   Title Pt will improve his R LE knee flexion and extension to 5/5 in order to improve function.   Time 8   Period Weeks   Status New     PT LONG TERM GOAL #4   Title Pt will improve R knee flexion to >/= 125 degrees in order to improve functional mobility.    Time 8   Period Weeks   Status New     PT LONG TERM GOAL #5   Title Pt will improve R knee extension to </= 5 degrees of flexion in order to improve gait.    Time 8   Period Weeks   Status New     Additional Long Term Goals   Additional Long Term Goals Yes     PT LONG TERM GOAL #6   Title Pt will be albe to improve R knee pain </= 3/10 with amb and ADL's.    Time 8   Period Weeks   Status New                Plan - 03/21/17 1214    Clinical Impression Statement Patient presenting today following a right TKR. Pt reporting 6/10 pain and presenting with decreased ROM and strengthening. Pt reporting trouble sleeping at night due to pain. Pt amb with antalgic gait pattern using a straight cane. Pt instruted to continue his HEP provided by HHPT with emphasis on knee extension and hamstring stretching. Skilled PT needed to progress pt toward his PLOF and return to work.    Clinical Presentation Stable   Clinical Decision Making Low   Rehab Potential Excellent   PT Frequency 3x / week  for 3 weeks and progress to 2x/week    PT Duration 8 weeks   PT Treatment/Interventions ADLs/Self Care Home Management;Gait training;Patient/family education;Taping;Vasopneumatic Device;Cryotherapy;Electrical Stimulation;Moist Heat;Ultrasound;Therapeutic exercise;Therapeutic activities;Functional mobility training;Stair training;Manual techniques;Passive range of motion   PT Next Visit Plan Nustep, Knee ROM and strengthening   PT Home Exercise Plan Quad sets, hamstring stretching, (HEP provided by HHPT with standing hip exercises)   Consulted and Agree with Plan of Care Patient      Patient will benefit from skilled therapeutic intervention in order to improve the following deficits and impairments:  Abnormal gait, Pain, Increased edema, Decreased activity tolerance, Decreased mobility, Difficulty walking, Decreased strength, Decreased range of motion  Visit Diagnosis: Acute pain of right knee - Plan: PT plan of care cert/re-cert  Stiffness of right knee, not elsewhere classified - Plan: PT plan of care cert/re-cert  Localized edema - Plan: PT plan of care cert/re-cert     Problem List Patient Active Problem List   Diagnosis Date Noted  . Status post total knee replacement, right 03/02/2017  . Chronic intractable headache 08/09/2016  . Other specified hypothyroidism 03/02/2016  . A-fib ([place]) 12/24/2015  . Non-ischemic cardiomyopathy ([place]) 11/07/2015  .  Long term (current) use of anticoagulants 11/07/2015  . Diaphoresis 11/01/2015  . Chest pain 11/01/2015  . PAF (paroxysmal atrial fibrillation) ([place]) 07/15/2015  . Heart palpitations 07/11/2015  . Dyspnea 02/18/2015  . Anemia, iron deficiency 09/10/2013  . Diaphragmatic hernia without mention of obstruction or gangrene   . Hypothyroidism   . Malaise and fatigue   . Arteritis, unspecified ([place])   . Iron deficiency anemia, unspecified   . Obesity   . BPH (benign prostatic hyperplasia)   . Essential hypertension, benign   . Hyperlipidemia     [name],  MPT  03/21/2017, 12:39 PM  [facility], [place], [address] Phone: [phone]   Fax:  [phone]  Name: Troy Acosta MRN: [id number] Date of Birth: January 24, 1951

## 2017-03-21 NOTE — Patient Instructions (Signed)
   Repeat 3-5 times on right LE Hold stretch 30 seconds each Repeat twice a day    Press knee down into the bed/mat Hold 5-10 seconds Repeat 20 times Twice a day

## 2017-03-22 ENCOUNTER — Ambulatory Visit: Payer: BLUE CROSS/BLUE SHIELD | Admitting: *Deleted

## 2017-03-22 DIAGNOSIS — M25561 Pain in right knee: Secondary | ICD-10-CM

## 2017-03-22 DIAGNOSIS — R600 Localized edema: Secondary | ICD-10-CM

## 2017-03-22 DIAGNOSIS — M25661 Stiffness of right knee, not elsewhere classified: Secondary | ICD-10-CM

## 2017-03-22 NOTE — Therapy (Signed)
[facility] [place], [place], [address] Phone: [phone]   Fax:  [phone]  Physical Therapy Treatment  Patient Details  Name: Troy Acosta MRN: [id number] Date of Birth: 27-Jan-1951 Referring Provider: Esmond Plants, MD  Encounter Date: 03/22/2017      PT End of Session - 03/22/17 1020    Visit Number 2   Number of Visits 20   Date for PT Re-Evaluation 05/21/17   PT Start Time 0945   PT Stop Time 1050   PT Time Calculation (min) 65 min      Past Medical History:  Diagnosis Date  . Anxiety   . Arteritis, unspecified ([place])   . Arthritis    hands, knees, feet  . Diaphragmatic hernia without mention of obstruction or gangrene   . Dysrhythmia    afib, ablation- 12/2015, no problems since, off blood thinner- 09/2016  . Essential hypertension, benign   . Headache    had MRI- was consulted with Dr. Jannifer Franklin, but the consult, headache has resolved.  . Hyperplasia of prostate   . Iron deficiency anemia, unspecified   . Malaise and fatigue   . Obesity   . Other and unspecified hyperlipidemia   . Paroxysmal atrial fibrillation (HCC)   . Unspecified hypothyroidism     Past Surgical History:  Procedure Laterality Date  . ELECTROPHYSIOLOGIC STUDY N/A 12/24/2015   Afib ablation by Dr Rayann Heman  . KNEE SURGERY Right   . TOTAL KNEE ARTHROPLASTY Right 03/02/2017   Procedure: RIGHT TOTAL KNEE ARTHROPLASTY;  Surgeon: [name], MD;  Location: [facility];  Service: Orthopedics;  Laterality: Right;    There were no vitals filed for this visit.      Subjective Assessment - 03/22/17 0959    Subjective Pt arriving to threapy following a total kneee replacement on 03/02/17. Pt reporting having 5 HHPT visits. Pt also reporting 6/10 R knee pain. Pt amb with antalgic gait using a straight cane.    How long can you sit comfortably? 2 hours   How long can you stand comfortably? 15 minutes   How long can you walk comfortably? 15 minutes    Diagnostic tests X-ray    Currently in Pain? Yes   Pain Score 4    Pain Location Knee   Pain Orientation Right                         [facility] Adult PT Treatment/Exercise - 03/22/17 0001      Exercises   Exercises Knee/Hip     Knee/Hip Exercises: Aerobic   Nustep L4 x 15  minutes seat progression for ROM     Knee/Hip Exercises: Standing   Forward Lunges Right;2 sets;10 reps   Rocker Board 5 minutes  PF/DF and calf stretching     Knee/Hip Exercises: Seated   Long Arc Quad Strengthening;Right;3 sets;10 reps   Long Arc Quad Weight 4 lbs.     Modalities   Modalities Vasopneumatic;Electrical Stimulation;Cryotherapy     Cryotherapy   Number Minutes Cryotherapy 15 Minutes   Cryotherapy Location Knee   Type of Cryotherapy Ice pack     Electrical Stimulation   Electrical Stimulation Location RT knee IFC x 15 mins 80-150hz    Electrical Stimulation Goals Pain                PT Education - 03/21/17 1047    Education provided Yes   Education Details Home exercise review, added quad sets, standing hamstring  curls, hamstring stretches, continue to ice and elevate   Person(s) Educated Patient   Methods Explanation;Demonstration   Comprehension Verbalized understanding;Returned demonstration          PT Short Term Goals - 03/21/17 1207      PT SHORT TERM GOAL #1   Title pt will be independent with his initial HEP   Time 3   Period Weeks   Status New     PT SHORT TERM GOAL #2   Title Pt will improve right knee flexion to >100 degrees actively in order to improve amb.   Time 3   Period Weeks   Status New           PT Long Term Goals - 03/21/17 1208      PT LONG TERM GOAL #1   Title Pt will improve his FOTO score from 61% limitation to </= 40% limitation.   Time 8   Period Weeks   Status New     PT LONG TERM GOAL #2   Title Pt will be able to amb with no assistive device >/= 1000 feet on community surfaces.   Time 8   Period Weeks    Status New     PT LONG TERM GOAL #3   Title Pt will improve his R LE knee flexion and extension to 5/5 in order to improve function.   Time 8   Period Weeks   Status New     PT LONG TERM GOAL #4   Title Pt will improve R knee flexion to >/= 125 degrees in order to improve functional mobility.    Time 8   Period Weeks   Status New     PT LONG TERM GOAL #5   Title Pt will improve R knee extension to </= 5 degrees of flexion in order to improve gait.    Time 8   Period Weeks   Status New     Additional Long Term Goals   Additional Long Term Goals Yes     PT LONG TERM GOAL #6   Title Pt will be albe to improve R knee pain </= 3/10 with amb and ADL's.    Time 8   Period Weeks   Status New               Plan - 03/22/17 1001    Clinical Impression Statement Pt arrived to clinic today doing fairly well with RT knee. He is ambulating with SPC with pain 6-7/10. He did well with therex and manual RT today with focus on ROM for flexion and extension.Marland Kitchen  PROM emphasis on extension.   Normal response to modalities   Rehab Potential Excellent   PT Frequency 3x / week   PT Duration 8 weeks   PT Treatment/Interventions ADLs/Self Care Home Management;Gait training;Patient/family education;Taping;Vasopneumatic Device;Cryotherapy;Electrical Stimulation;Moist Heat;Ultrasound;Therapeutic exercise;Therapeutic activities;Functional mobility training;Stair training;Manual techniques;Passive range of motion   PT Next Visit Plan Nustep, Knee ROM and strengthening   PT Home Exercise Plan Quad sets, hamstring stretching, (HEP provided by HHPT with standing hip exercises)   Consulted and Agree with Plan of Care Patient      Patient will benefit from skilled therapeutic intervention in order to improve the following deficits and impairments:  Abnormal gait, Pain, Increased edema, Decreased activity tolerance, Decreased mobility, Difficulty walking, Decreased strength, Decreased range of  motion  Visit Diagnosis: Acute pain of right knee  Stiffness of right knee, not elsewhere classified  Localized edema  Problem List Patient Active Problem List   Diagnosis Date Noted  . Status post total knee replacement, right 03/02/2017  . Chronic intractable headache 08/09/2016  . Other specified hypothyroidism 03/02/2016  . A-fib ([place]) 12/24/2015  . Non-ischemic cardiomyopathy ([place]) 11/07/2015  . Long term (current) use of anticoagulants 11/07/2015  . Diaphoresis 11/01/2015  . Chest pain 11/01/2015  . PAF (paroxysmal atrial fibrillation) ([place]) 07/15/2015  . Heart palpitations 07/11/2015  . Dyspnea 02/18/2015  . Anemia, iron deficiency 09/10/2013  . Diaphragmatic hernia without mention of obstruction or gangrene   . Hypothyroidism   . Malaise and fatigue   . Arteritis, unspecified ([place])   . Iron deficiency anemia, unspecified   . Obesity   . BPH (benign prostatic hyperplasia)   . Essential hypertension, benign   . Hyperlipidemia     Troy Acosta,CHRIS, PTA 03/22/2017, 4:57 PM  [facility] [address] [place], [place], [address] Phone: [phone]   Fax:  [phone]  Name: Troy Acosta MRN: [id number] Date of Birth: 08-Dec-1950

## 2017-03-26 ENCOUNTER — Ambulatory Visit: Payer: BLUE CROSS/BLUE SHIELD | Admitting: Physical Therapy

## 2017-03-26 DIAGNOSIS — M25561 Pain in right knee: Secondary | ICD-10-CM

## 2017-03-26 DIAGNOSIS — R600 Localized edema: Secondary | ICD-10-CM

## 2017-03-26 DIAGNOSIS — M25661 Stiffness of right knee, not elsewhere classified: Secondary | ICD-10-CM | POA: Diagnosis not present

## 2017-03-26 NOTE — Therapy (Signed)
[facility] [place], [place], [address] Phone: [phone]   Fax:  [phone]  Physical Therapy Treatment  Patient Details  Name: Troy Acosta MRN: [id number] Date of Birth: [birth year] Referring Provider: Esmond Plants, MD  Encounter Date: 03/26/2017      PT End of Session - 03/26/17 0905    Visit Number 3   Number of Visits 20   Date for PT Re-Evaluation 05/21/17   PT Start Time 0900   PT Stop Time 0958   PT Time Calculation (min) 58 min   Activity Tolerance Patient tolerated treatment well   Behavior During Therapy [facility] for tasks assessed/performed      Past Medical History:  Diagnosis Date  . Anxiety   . Arteritis, unspecified ([place])   . Arthritis    hands, knees, feet  . Diaphragmatic hernia without mention of obstruction or gangrene   . Dysrhythmia    afib, ablation- 12/2015, no problems since, off blood thinner- 09/2016  . Essential hypertension, benign   . Headache    had MRI- was consulted with Dr. Jannifer Franklin, but the consult, headache has resolved.  . Hyperplasia of prostate   . Iron deficiency anemia, unspecified   . Malaise and fatigue   . Obesity   . Other and unspecified hyperlipidemia   . Paroxysmal atrial fibrillation (HCC)   . Unspecified hypothyroidism     Past Surgical History:  Procedure Laterality Date  . ELECTROPHYSIOLOGIC STUDY N/A 12/24/2015   Afib ablation by Dr Rayann Heman  . KNEE SURGERY Right   . TOTAL KNEE ARTHROPLASTY Right 03/02/2017   Procedure: RIGHT TOTAL KNEE ARTHROPLASTY;  Surgeon: [name], MD;  Location: [facility];  Service: Orthopedics;  Laterality: Right;    There were no vitals filed for this visit.      Subjective Assessment - 03/26/17 0905    Subjective Patient presents today with reports of 5/10 pain.   How long can you sit comfortably? 2 hours   How long can you stand comfortably? 15 minutes   How long can you walk comfortably? 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 Yes   Pain Score 5    Pain Location Knee   Pain Orientation Right            [facility] PT Assessment - 03/26/17 0001      AROM   AROM Assessment Site Knee   Right/Left Knee Right   Right Knee Extension -9   Right Knee Flexion 100  93 deg before mobs/ROM; 104 passive                     [facility] Adult PT Treatment/Exercise - 03/26/17 0001      Knee/Hip Exercises: Aerobic   Nustep L4 x 16  minutes seat progression for ROM     Knee/Hip Exercises: Standing   Forward Lunges Right;2 sets;10 reps  25 reps total   Lateral Step Up Right;20 reps;Hand Hold: 2;Step Height: 8"   Forward Step Up 10 reps;20 reps;Hand Hold: 2;Step Height: 6";Step Height: 8"  10 reps at 6; 20 reps at 8 inch   Rocker Board 2 minutes  PF/DF and calf stretching     Knee/Hip Exercises: Seated   Long Arc Quad Strengthening;Right;3 sets;10 reps   Long Arc Quad Weight 4 lbs.   Long [employer] Limitations VCs to hold and slow retirm  Modalities   Modalities Passenger transport manager Location RT knee IFC x 15 mins 1-10hz    Electrical Stimulation Goals Edema;Pain     Vasopneumatic   Number Minutes Vasopneumatic  15 minutes   Vasopnuematic Location  Knee   Vasopneumatic Pressure Medium   Vasopneumatic Temperature  34     Manual Therapy   Manual Therapy Joint mobilization;Passive ROM   Joint Mobilization patellar mobs; knee joint for relaxation   Passive ROM into flex and ext dynamic and static                  PT Short Term Goals - 03/21/17 1207      PT SHORT TERM GOAL #1   Title pt will be independent with his initial HEP   Time 3   Period Weeks   Status New     PT SHORT TERM GOAL #2   Title Pt will improve right knee flexion to >100 degrees actively in order to improve amb.   Time 3   Period Weeks   Status New            PT Long Term Goals - 03/21/17 1208      PT LONG TERM GOAL #1   Title Pt will improve his FOTO score from 61% limitation to </= 40% limitation.   Time 8   Period Weeks   Status New     PT LONG TERM GOAL #2   Title Pt will be able to amb with no assistive device >/= 1000 feet on community surfaces.   Time 8   Period Weeks   Status New     PT LONG TERM GOAL #3   Title Pt will improve his R LE knee flexion and extension to 5/5 in order to improve function.   Time 8   Period Weeks   Status New     PT LONG TERM GOAL #4   Title Pt will improve R knee flexion to >/= 125 degrees in order to improve functional mobility.    Time 8   Period Weeks   Status New     PT LONG TERM GOAL #5   Title Pt will improve R knee extension to </= 5 degrees of flexion in order to improve gait.    Time 8   Period Weeks   Status New     Additional Long Term Goals   Additional Long Term Goals Yes     PT LONG TERM GOAL #6   Title Pt will be albe to improve R knee pain </= 3/10 with amb and ADL's.    Time 8   Period Weeks   Status New               Plan - 03/26/17 1054    Clinical Impression Statement Patient reports continued pain in R knee affecting sleep. He did well with TE today and knee flexion improved from 93 to 100 with gentle mobs, ROM and relaxation techniques. Pain is his limiting factor.   Clinical Presentation Stable   Clinical Decision Making Low   Rehab Potential Excellent   PT Frequency 3x / week   PT Duration 8 weeks   PT Treatment/Interventions ADLs/Self Care Home Management;Gait training;Patient/family education;Taping;Vasopneumatic Device;Cryotherapy;Electrical Stimulation;Moist Heat;Ultrasound;Therapeutic exercise;Therapeutic activities;Functional mobility training;Stair training;Manual techniques;Passive range of motion   PT Next Visit Plan Nustep, Knee ROM and strengthening   PT Home Exercise Plan Quad sets, hamstring stretching, (HEP provided by HHPT  with standing  hip exercises)      Patient will benefit from skilled therapeutic intervention in order to improve the following deficits and impairments:  Abnormal gait, Pain, Increased edema, Decreased activity tolerance, Decreased mobility, Difficulty walking, Decreased strength, Decreased range of motion  Visit Diagnosis: Stiffness of right knee, not elsewhere classified  Acute pain of right knee  Localized edema     Problem List Patient Active Problem List   Diagnosis Date Noted  . Status post total knee replacement, right 03/02/2017  . Chronic intractable headache 08/09/2016  . Other specified hypothyroidism 03/02/2016  . A-fib ([place]) 12/24/2015  . Non-ischemic cardiomyopathy ([place]) 11/07/2015  . Long term (current) use of anticoagulants 11/07/2015  . Diaphoresis 11/01/2015  . Chest pain 11/01/2015  . PAF (paroxysmal atrial fibrillation) ([place]) 07/15/2015  . Heart palpitations 07/11/2015  . Dyspnea 02/18/2015  . Anemia, iron deficiency 09/10/2013  . Diaphragmatic hernia without mention of obstruction or gangrene   . Hypothyroidism   . Malaise and fatigue   . Arteritis, unspecified ([place])   . Iron deficiency anemia, unspecified   . Obesity   . BPH (benign prostatic hyperplasia)   . Essential hypertension, benign   . Hyperlipidemia      Madelyn Flavors PT 03/26/2017, 11:01 AM  [facility] [address] [place], [place], [address] Phone: [phone]   Fax:  [phone]  Name: Troy Acosta MRN: [id number] Date of Birth: 20-Jun-1951

## 2017-03-28 ENCOUNTER — Ambulatory Visit: Payer: BLUE CROSS/BLUE SHIELD | Admitting: Physical Therapy

## 2017-03-28 ENCOUNTER — Encounter: Payer: Self-pay | Admitting: Physical Therapy

## 2017-03-28 DIAGNOSIS — R600 Localized edema: Secondary | ICD-10-CM

## 2017-03-28 DIAGNOSIS — M25661 Stiffness of right knee, not elsewhere classified: Secondary | ICD-10-CM

## 2017-03-28 DIAGNOSIS — M25561 Pain in right knee: Secondary | ICD-10-CM | POA: Diagnosis not present

## 2017-03-28 NOTE — Therapy (Addendum)
[facility] [place], [place], [address] Phone: [phone]   Fax:  [phone]  Physical Therapy Treatment  Patient Details  Name: Troy Acosta Acosta MRN: [id number] Date of Birth: [birth year] Referring Provider: Esmond Plants, MD  Encounter Date: 03/28/2017      PT End of Session - 04/02/17 0905    Visit Number 6   Number of Visits 20   Date for PT Re-Evaluation 05/21/17   PT Start Time 0900   PT Stop Time 1001   PT Time Calculation (min) 61 min   Activity Tolerance Patient tolerated treatment well   Behavior During Therapy [facility] for tasks assessed/performed      Past Medical History:  Diagnosis Date  . Anxiety   . Arteritis, unspecified (Troy Acosta Acosta)   . Arthritis    hands, knees, feet  . Diaphragmatic hernia without mention of obstruction or gangrene   . Dysrhythmia    afib, ablation- 12/2015, no problems since, off blood thinner- 09/2016  . Essential hypertension, benign   . Headache    had MRI- was consulted with Dr. Jannifer Acosta, but the consult, headache has resolved.  . Hyperplasia of prostate   . Iron deficiency anemia, unspecified   . Malaise and fatigue   . Obesity   . Other and unspecified hyperlipidemia   . Paroxysmal atrial fibrillation (HCC)   . Unspecified hypothyroidism     Past Surgical History:  Procedure Laterality Date  . ELECTROPHYSIOLOGIC STUDY N/A 12/24/2015   Afib ablation by Dr Rayann Heman  . KNEE SURGERY Right   . TOTAL KNEE ARTHROPLASTY Right 03/02/2017   Procedure: RIGHT TOTAL KNEE ARTHROPLASTY;  Surgeon: Troy Acosta Cedars, MD;  Location: [facility];  Service: Orthopedics;  Laterality: Right;    There were no vitals filed for this visit.      Subjective Assessment - 04/02/17 0913    Subjective Patient states he if stiff today.   Limitations Walking   How long can you sit comfortably? 2 hours   How long can you stand comfortably? 15 minutes   How long can you walk comfortably? 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 Yes   Pain Score 5    Pain Location Knee   Pain Descriptors / Indicators Aching   Pain Type Surgical pain   Pain Onset 1 to 4 weeks ago                                   PT Short Term Goals - 03/21/17 1207      PT SHORT TERM GOAL #1   Title pt will be independent with his initial HEP   Time 3   Period Weeks   Status New     PT SHORT TERM GOAL #2   Title Pt will improve right knee flexion to >100 degrees actively in order to improve amb.   Time 3   Period Weeks   Status New           PT Long Term Goals - 03/28/17 1231      PT LONG TERM GOAL #1   Title Pt will improve his FOTO score from 61% limitation to </= 40% limitation.   Time 8   Period Weeks   Status New     PT LONG TERM GOAL #2   Title Pt  will be able to amb with no assistive device >/= 1000 feet on community surfaces.   Time 8   Period Weeks   Status New     PT LONG TERM GOAL #3   Title Pt will improve his R LE knee flexion and extension to 5/5 in order to improve function.   Time 8   Period Weeks   Status New     PT LONG TERM GOAL #4   Title Pt will improve R knee flexion to >/= 125 degrees in order to improve functional mobility.    Time 8   Status New     PT LONG TERM GOAL #6   Title Pt will be albe to improve R knee pain </= 3/10 with amb and ADL's.                Plan - 04/02/17 1012    Clinical Impression Statement Patient did well with TE today. He has been active at home with activities which keep him from focusing on the pain he says.   PT Treatment/Interventions ADLs/Self Care Home Management;Gait training;Patient/family education;Taping;Vasopneumatic Device;Cryotherapy;Electrical Stimulation;Moist Heat;Ultrasound;Therapeutic exercise;Therapeutic activities;Functional mobility training;Stair training;Manual techniques;Passive range of motion   PT Next Visit Plan  Nustep, Knee ROM and strengthening   PT Home Exercise Plan Quad sets, hamstring stretching, (HEP provided by HHPT with standing hip exercises)      Patient will benefit from skilled therapeutic intervention in order to improve the following deficits and impairments:  Abnormal gait, Pain, Increased edema, Decreased activity tolerance, Decreased mobility, Difficulty walking, Decreased strength, Decreased range of motion  Visit Diagnosis: Stiffness of right knee, not elsewhere classified  Acute pain of right knee  Localized edema     Problem List Patient Active Problem List   Diagnosis Date Noted  . Status post total knee replacement, right 03/02/2017  . Chronic intractable headache 08/09/2016  . Other specified hypothyroidism 03/02/2016  . A-fib ([place]) 12/24/2015  . Non-ischemic cardiomyopathy (Troy Acosta) 11/07/2015  . Long term (current) use of anticoagulants 11/07/2015  . Diaphoresis 11/01/2015  . Chest pain 11/01/2015  . PAF (paroxysmal atrial fibrillation) ([place]) 07/15/2015  . Heart palpitations 07/11/2015  . Dyspnea 02/18/2015  . Anemia, iron deficiency 09/10/2013  . Diaphragmatic hernia without mention of obstruction or gangrene   . Hypothyroidism   . Malaise and fatigue   . Arteritis, unspecified ([place])   . Iron deficiency anemia, unspecified   . Obesity   . BPH (benign prostatic hyperplasia)   . Essential hypertension, benign   . Hyperlipidemia     Troy Acosta Acosta, MPT 04/03/2017, 1:28 PM  [facility] [place], [place], [address] Phone: [phone]   Fax:  [phone]  Name: Troy Acosta Acosta MRN: [id number] Date of Birth: September 29, 1951

## 2017-03-30 ENCOUNTER — Encounter: Payer: Self-pay | Admitting: Internal Medicine

## 2017-03-30 ENCOUNTER — Ambulatory Visit (INDEPENDENT_AMBULATORY_CARE_PROVIDER_SITE_OTHER): Payer: BLUE CROSS/BLUE SHIELD | Admitting: Internal Medicine

## 2017-03-30 ENCOUNTER — Ambulatory Visit: Payer: BLUE CROSS/BLUE SHIELD | Admitting: Physical Therapy

## 2017-03-30 VITALS — BP 122/74 | HR 89 | Ht 71.0 in | Wt 243.0 lb

## 2017-03-30 DIAGNOSIS — I10 Essential (primary) hypertension: Secondary | ICD-10-CM

## 2017-03-30 DIAGNOSIS — M25561 Pain in right knee: Secondary | ICD-10-CM | POA: Diagnosis not present

## 2017-03-30 DIAGNOSIS — M25661 Stiffness of right knee, not elsewhere classified: Secondary | ICD-10-CM

## 2017-03-30 DIAGNOSIS — R600 Localized edema: Secondary | ICD-10-CM

## 2017-03-30 DIAGNOSIS — I480 Paroxysmal atrial fibrillation: Secondary | ICD-10-CM | POA: Diagnosis not present

## 2017-03-30 NOTE — Patient Instructions (Signed)

## 2017-03-30 NOTE — Progress Notes (Signed)
PCP: Chipper Herb, MD Primary Cardiologist: Dr Excell Seltzer Troy Acosta is a 66 y.o. male who presents today for routine electrophysiology followup.  Since last being seen in our clinic, the patient reports doing very well.  Today, he denies symptoms of palpitations, chest pain, shortness of breath,  lower extremity edema, dizziness, presyncope, or syncope.  The patient is otherwise without complaint today.   Past Medical History:  Diagnosis Date  . Anxiety   . Arteritis, unspecified ([place])   . Arthritis    hands, knees, feet  . Diaphragmatic hernia without mention of obstruction or gangrene   . Dysrhythmia    afib, ablation- 12/2015, no problems since, off blood thinner- 09/2016  . Essential hypertension, benign   . Headache    had MRI- was consulted with Dr. Jannifer Franklin, but the consult, headache has resolved.  . Hyperplasia of prostate   . Iron deficiency anemia, unspecified   . Malaise and fatigue   . Obesity   . Other and unspecified hyperlipidemia   . Paroxysmal atrial fibrillation (HCC)   . Unspecified hypothyroidism    Past Surgical History:  Procedure Laterality Date  . ELECTROPHYSIOLOGIC STUDY N/A 12/24/2015   Afib ablation by Dr Rayann Heman  . KNEE SURGERY Right   . TOTAL KNEE ARTHROPLASTY Right 03/02/2017   Procedure: RIGHT TOTAL KNEE ARTHROPLASTY;  Surgeon: [name], MD;  Location: [facility];  Service: Orthopedics;  Laterality: Right;    ROS- all systems are reviewed and negatives except as per HPI above  Current Outpatient Prescriptions  Medication Sig Dispense Refill  . acetaminophen (TYLENOL) 500 MG tablet Take 500 mg by mouth daily.    Marland Kitchen amLODipine (NORVASC) 5 MG tablet Take 1 tablet (5 mg total) by mouth daily. 180 tablet 3  . aspirin (ASPIRIN CHILDRENS) 81 MG chewable tablet Chew 1 tablet (81 mg total) by mouth 2 (two) times daily. 60 tablet 0  . Cholecalciferol (VITAMIN D3) 1000 UNITS CAPS Take 1,000 Units by mouth at bedtime.     . hydrochlorothiazide (HYDRODIURIL) 25  MG tablet Take 1 tablet (25 mg total) by mouth daily. 90 tablet 3  . HYDROmorphone (DILAUDID) 2 MG tablet Take 1-2 tablets (2-4 mg total) by mouth every 4 (four) hours as needed for severe pain. 30 tablet 0  . iron polysaccharides (FERREX 150) 150 MG capsule Take 1 capsule (150 mg total) by mouth daily. (Patient taking differently: Take 150 mg by mouth at bedtime. ) 90 capsule 3  . levothyroxine (SYNTHROID, LEVOTHROID) 150 MCG tablet Take 1 tablet (150 mcg total) by mouth daily before breakfast. 90 tablet 3  . levothyroxine (SYNTHROID, LEVOTHROID) 25 MCG tablet TAKE ONE-HALF TABLET BY MOUTH ONCE DAILY BEFORE BREAKFAST (Patient taking differently: Take 12.5 mcg by mouth daily before breakfast. TAKE ONE-HALF TABLET BY MOUTH ONCE DAILY BEFORE BREAKFAST) 45 tablet 3  . LORazepam (ATIVAN) 0.5 MG tablet TAKE ONE TABLET BY MOUTH ONCE DAILY AS NEEDED (Patient taking differently: Take 0.5 mg by mouth every morning. TAKE ONE TABLET BY MOUTH ONCE DAILY) 30 tablet 0  . methocarbamol (ROBAXIN) 500 MG tablet Take 1 tablet (500 mg total) by mouth 3 (three) times daily as needed. 60 tablet 1  . metoprolol tartrate (LOPRESSOR) 25 MG tablet Take 12.5 mg by mouth daily as needed (Elevated heart rate).     Marland Kitchen olmesartan (BENICAR) 40 MG tablet Take 0.5 tablets (20 mg total) by mouth 2 (two) times daily. 180 tablet 3  . rosuvastatin (CRESTOR) 5 MG tablet Take 1 tablet (  5 mg total) by mouth daily. (Patient taking differently: Take 5 mg by mouth every evening. ) 90 tablet 3   No current facility-administered medications for this visit.    Facility-Administered Medications Ordered in Other Visits  Medication Dose Route Frequency Provider Last Rate Last Dose  . gadopentetate dimeglumine (MAGNEVIST) injection 20 mL  20 mL Intravenous Once PRN Kathrynn Ducking, MD        Physical Exam: Vitals:   03/30/17 1542  BP: 122/74  Pulse: 89  SpO2: 95%  Weight: 243 lb (110.2 kg)  Height: 5\' 11"  (1.803 m)    GEN- The patient  is well appearing, alert and oriented x 3 today.   Head- normocephalic, atraumatic Eyes-  Sclera clear, conjunctiva pink Ears- hearing intact Oropharynx- clear Lungs- Clear to ausculation bilaterally, normal work of breathing Heart- Regular rate and rhythm, no murmurs, rubs or gallops, PMI not laterally displaced GI- soft, NT, ND, + BS Extremities- no clubbing, cyanosis, or edema  EKG tracing ordered today is personally reviewed and shows sinus rhythm 86 bpm  Assessment and Plan:  1. Paroxysmal atrial fibrillation Doing great s/p ablation off AAD therapy chads2vasc score is 1.  No anticoagulation  2. HTN Stable No change required today  3. Nonischemic CM Resolved with ablation for afib  Return to see me in [place] in 6 months   [name] MD, [facility] 03/30/2017 3:58 PM

## 2017-03-30 NOTE — Therapy (Signed)
[facility] [place], [place], [address] Phone: [phone]   Fax:  [phone]  Physical Therapy Treatment  Patient Details  Name: Troy Acosta MRN: [id number] Date of Birth: [birth year] Referring Provider: Esmond Plants, MD  Encounter Date: 03/30/2017      PT End of Session - 03/30/17 0946    Visit Number 5   Number of Visits 20   Date for PT Re-Evaluation 05/21/17   PT Start Time 0945   PT Stop Time 5956   PT Time Calculation (min) 58 min   Activity Tolerance Patient tolerated treatment well   Behavior During Therapy [facility] for tasks assessed/performed      Past Medical History:  Diagnosis Date  . Anxiety   . Arteritis, unspecified ([place])   . Arthritis    hands, knees, feet  . Diaphragmatic hernia without mention of obstruction or gangrene   . Dysrhythmia    afib, ablation- 12/2015, no problems since, off blood thinner- 09/2016  . Essential hypertension, benign   . Headache    had MRI- was consulted with Dr. Jannifer Franklin, but the consult, headache has resolved.  . Hyperplasia of prostate   . Iron deficiency anemia, unspecified   . Malaise and fatigue   . Obesity   . Other and unspecified hyperlipidemia   . Paroxysmal atrial fibrillation (HCC)   . Unspecified hypothyroidism     Past Surgical History:  Procedure Laterality Date  . ELECTROPHYSIOLOGIC STUDY N/A 12/24/2015   Afib ablation by Dr Rayann Heman  . KNEE SURGERY Right   . TOTAL KNEE ARTHROPLASTY Right 03/02/2017   Procedure: RIGHT TOTAL KNEE ARTHROPLASTY;  Surgeon: [name], MD;  Location: [facility];  Service: Orthopedics;  Laterality: Right;    There were no vitals filed for this visit.      Subjective Assessment - 03/30/17 0947    Subjective Patient states he is stiff today.   Limitations Walking   How long can you sit comfortably? 2 hours   How long can you stand comfortably? 15 minutes   How long can you walk comfortably? 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 Yes   Pain Score 4    Pain Location Knee   Pain Orientation Right   Pain Descriptors / Indicators Aching   Pain Type Surgical pain            [facility] PT Assessment - 03/30/17 0001      AROM   AROM Assessment Site Knee   Right/Left Knee Right   Right Knee Extension -7                     [facility] Adult PT Treatment/Exercise - 03/30/17 0001      Knee/Hip Exercises: Aerobic   Nustep L4 x 16  minutes seat progression for ROM  moving seat forward for knee flexion     Knee/Hip Exercises: Standing   Forward Lunges Right   Forward Lunges Limitations x 2 min   Terminal Knee Extension Limitations TKE green band 10 sec hold x 10   Lateral Step Up Right;20 reps;Hand Hold: 2;Step Height: 8"   Forward Step Up 10 reps;2 sets;Hand Hold: 1;Step Height: 8"   Rocker Board 4 minutes  PF/DF and calf stretching     Cryotherapy   Number Minutes Cryotherapy 15 Minutes   Cryotherapy Location Knee   Type of Cryotherapy Ice  pack     Electrical Stimulation   Electrical Stimulation Location RT knee IFC x 15 mins 1-10hz    Electrical Stimulation Goals Edema;Pain     Vasopneumatic   Number Minutes Vasopneumatic  --   Vasopnuematic Location  --   Vasopneumatic Pressure --   Vasopneumatic Temperature  --     Manual Therapy   Manual Therapy Passive ROM   Passive ROM into extension with prolonged holds                  PT Short Term Goals - 03/21/17 1207      PT SHORT TERM GOAL #1   Title pt will be independent with his initial HEP   Time 3   Period Weeks   Status New     PT SHORT TERM GOAL #2   Title Pt will improve right knee flexion to >100 degrees actively in order to improve amb.   Time 3   Period Weeks   Status New           PT Long Term Goals - 03/28/17 1231      PT LONG TERM GOAL #1   Title Pt will improve his FOTO score from 61% limitation to </= 40%  limitation.   Time 8   Period Weeks   Status New     PT LONG TERM GOAL #2   Title Pt will be able to amb with no assistive device >/= 1000 feet on community surfaces.   Time 8   Period Weeks   Status New     PT LONG TERM GOAL #3   Title Pt will improve his R LE knee flexion and extension to 5/5 in order to improve function.   Time 8   Period Weeks   Status New     PT LONG TERM GOAL #4   Title Pt will improve R knee flexion to >/= 125 degrees in order to improve functional mobility.    Time 8   Status New     PT LONG TERM GOAL #6   Title Pt will be albe to improve R knee pain </= 3/10 with amb and ADL's.                Plan - 03/30/17 1138    Clinical Impression Statement Patient tolerated therex well today and is progressing slowly with R knee extension (-7 deg).    PT Treatment/Interventions ADLs/Self Care Home Management;Gait training;Patient/family education;Taping;Vasopneumatic Device;Cryotherapy;Electrical Stimulation;Moist Heat;Ultrasound;Therapeutic exercise;Therapeutic activities;Functional mobility training;Stair training;Manual techniques;Passive range of motion   PT Next Visit Plan Nustep, Knee ROM and strengthening   PT Home Exercise Plan Quad sets, hamstring stretching, (HEP provided by HHPT with standing hip exercises)      Patient will benefit from skilled therapeutic intervention in order to improve the following deficits and impairments:  Abnormal gait, Pain, Increased edema, Decreased activity tolerance, Decreased mobility, Difficulty walking, Decreased strength, Decreased range of motion  Visit Diagnosis: Stiffness of right knee, not elsewhere classified  Acute pain of right knee  Localized edema     Problem List Patient Active Problem List   Diagnosis Date Noted  . Status post total knee replacement, right 03/02/2017  . Chronic intractable headache 08/09/2016  . Other specified hypothyroidism 03/02/2016  . A-fib ([place]) 12/24/2015  .  Non-ischemic cardiomyopathy ([place]) 11/07/2015  . Long term (current) use of anticoagulants 11/07/2015  . Diaphoresis 11/01/2015  . Chest pain 11/01/2015  . PAF (paroxysmal atrial fibrillation) ([place]) 07/15/2015  . Heart  palpitations 07/11/2015  . Dyspnea 02/18/2015  . Anemia, iron deficiency 09/10/2013  . Diaphragmatic hernia without mention of obstruction or gangrene   . Hypothyroidism   . Malaise and fatigue   . Arteritis, unspecified ([place])   . Iron deficiency anemia, unspecified   . Obesity   . BPH (benign prostatic hyperplasia)   . Essential hypertension, benign   . Hyperlipidemia     Madelyn Flavors PT 03/30/2017, 11:41 AM  [facility] [address] [place], [place], [address] Phone: [phone]   Fax:  [phone]  Name: Reuel Lamadrid MRN: [id number] Date of Birth: 18-Apr-1951

## 2017-04-02 ENCOUNTER — Ambulatory Visit: Payer: BLUE CROSS/BLUE SHIELD | Admitting: Physical Therapy

## 2017-04-02 DIAGNOSIS — M25561 Pain in right knee: Secondary | ICD-10-CM

## 2017-04-02 DIAGNOSIS — R600 Localized edema: Secondary | ICD-10-CM

## 2017-04-02 DIAGNOSIS — M25661 Stiffness of right knee, not elsewhere classified: Secondary | ICD-10-CM | POA: Diagnosis not present

## 2017-04-02 NOTE — Therapy (Signed)
[facility] [place], [place], [address] Phone: [phone]   Fax:  [phone]  Physical Therapy Treatment  Patient Details  Name: Troy Acosta MRN: [id number] Date of Birth: Feb 15, 1951 Referring Provider: Esmond Plants, MD  Encounter Date: 04/02/2017      PT End of Session - 04/02/17 0905    Visit Number 6   Number of Visits 20   Date for PT Re-Evaluation 05/21/17   PT Start Time 0900   PT Stop Time 1001   PT Time Calculation (min) 61 min   Activity Tolerance Patient tolerated treatment well   Behavior During Therapy [facility] for tasks assessed/performed      Past Medical History:  Diagnosis Date  . Anxiety   . Arteritis, unspecified ([place])   . Arthritis    hands, knees, feet  . Diaphragmatic hernia without mention of obstruction or gangrene   . Dysrhythmia    afib, ablation- 12/2015, no problems since, off blood thinner- 09/2016  . Essential hypertension, benign   . Headache    had MRI- was consulted with Dr. Jannifer Franklin, but the consult, headache has resolved.  . Hyperplasia of prostate   . Iron deficiency anemia, unspecified   . Malaise and fatigue   . Obesity   . Other and unspecified hyperlipidemia   . Paroxysmal atrial fibrillation (HCC)   . Unspecified hypothyroidism     Past Surgical History:  Procedure Laterality Date  . ELECTROPHYSIOLOGIC STUDY N/A 12/24/2015   Afib ablation by Dr Rayann Heman  . KNEE SURGERY Right   . TOTAL KNEE ARTHROPLASTY Right 03/02/2017   Procedure: RIGHT TOTAL KNEE ARTHROPLASTY;  Surgeon: [name], MD;  Location: [facility];  Service: Orthopedics;  Laterality: Right;    There were no vitals filed for this visit.      Subjective Assessment - 04/02/17 0913    Subjective Patient states he is stiff today.   Limitations Walking   How long can you sit comfortably? 2 hours   How long can you stand comfortably? 15 minutes   How long can you walk comfortably? 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 Yes   Pain Score 5    Pain Location Knee   Pain Descriptors / Indicators Aching   Pain Type Surgical pain   Pain Onset 1 to 4 weeks ago                         [facility] Adult PT Treatment/Exercise - 04/02/17 0001      Knee/Hip Exercises: Aerobic   Nustep L6 x 16  minutes seat progression to seat 10  moving seat forward for knee flexion     Knee/Hip Exercises: Standing   Forward Lunges Right   Forward Lunges Limitations x 2 min   Terminal Knee Extension Limitations TKE green band 10 sec hold x 15   Lateral Step Up Right;20 reps;Hand Hold: 2;Step Height: 8"   Forward Step Up 20 reps;Hand Hold: 1;Step Height: 8";Right   Rocker Board 4 minutes  PF/DF x 2 min; 2x 45min just DF     Knee/Hip Exercises: Seated   Long Arc Quad Strengthening;Right;10 reps;2 sets  10 sec hold   Long Arc Quad Weight 4 lbs.     Modalities   Modalities Passenger transport manager Location RT knee IFC  x 15 mins 1-10hz    Electrical Stimulation Goals Edema;Pain     Vasopneumatic   Number Minutes Vasopneumatic  15 minutes   Vasopnuematic Location  Knee   Vasopneumatic Pressure Medium   Vasopneumatic Temperature  34                  PT Short Term Goals - 03/21/17 1207      PT SHORT TERM GOAL #1   Title pt will be independent with his initial HEP   Time 3   Period Weeks   Status New     PT SHORT TERM GOAL #2   Title Pt will improve right knee flexion to >100 degrees actively in order to improve amb.   Time 3   Period Weeks   Status New           PT Long Term Goals - 03/28/17 1231      PT LONG TERM GOAL #1   Title Pt will improve his FOTO score from 61% limitation to </= 40% limitation.   Time 8   Period Weeks   Status New     PT LONG TERM GOAL #2   Title Pt will be able to amb with no assistive device >/=  1000 feet on community surfaces.   Time 8   Period Weeks   Status New     PT LONG TERM GOAL #3   Title Pt will improve his R LE knee flexion and extension to 5/5 in order to improve function.   Time 8   Period Weeks   Status New     PT LONG TERM GOAL #4   Title Pt will improve R knee flexion to >/= 125 degrees in order to improve functional mobility.    Time 8   Status New     PT LONG TERM GOAL #6   Title Pt will be albe to improve R knee pain </= 3/10 with amb and ADL's.                Plan - 04/02/17 1012    Clinical Impression Statement Patient did well with TE today. He has been active at home with activities which keep him from focusing on the pain he says.   PT Treatment/Interventions ADLs/Self Care Home Management;Gait training;Patient/family education;Taping;Vasopneumatic Device;Cryotherapy;Electrical Stimulation;Moist Heat;Ultrasound;Therapeutic exercise;Therapeutic activities;Functional mobility training;Stair training;Manual techniques;Passive range of motion   PT Next Visit Plan Nustep, Knee ROM and strengthening   PT Home Exercise Plan Quad sets, hamstring stretching, (HEP provided by HHPT with standing hip exercises)      Patient will benefit from skilled therapeutic intervention in order to improve the following deficits and impairments:  Abnormal gait, Pain, Increased edema, Decreased activity tolerance, Decreased mobility, Difficulty walking, Decreased strength, Decreased range of motion  Visit Diagnosis: Stiffness of right knee, not elsewhere classified  Acute pain of right knee  Localized edema     Problem List Patient Active Problem List   Diagnosis Date Noted  . Status post total knee replacement, right 03/02/2017  . Chronic intractable headache 08/09/2016  . Other specified hypothyroidism 03/02/2016  . A-fib ([place]) 12/24/2015  . Non-ischemic cardiomyopathy ([place]) 11/07/2015  . Long term (current) use of anticoagulants 11/07/2015  .  Diaphoresis 11/01/2015  . Chest pain 11/01/2015  . PAF (paroxysmal atrial fibrillation) ([place]) 07/15/2015  . Heart palpitations 07/11/2015  . Dyspnea 02/18/2015  . Anemia, iron deficiency 09/10/2013  . Diaphragmatic hernia without mention of obstruction or gangrene   . Hypothyroidism   .  Malaise and fatigue   . Arteritis, unspecified ([place])   . Iron deficiency anemia, unspecified   . Obesity   . BPH (benign prostatic hyperplasia)   . Essential hypertension, benign   . Hyperlipidemia    Madelyn Flavors PT 04/02/2017, 10:16 AM  [facility] [address] [place], [place], [address] Phone: [phone]   Fax:  [phone]  Name: Troy Acosta MRN: [id number] Date of Birth: [birth year]

## 2017-04-04 ENCOUNTER — Encounter: Payer: Self-pay | Admitting: Physical Therapy

## 2017-04-04 ENCOUNTER — Ambulatory Visit: Payer: BLUE CROSS/BLUE SHIELD | Admitting: Physical Therapy

## 2017-04-04 DIAGNOSIS — M25561 Pain in right knee: Secondary | ICD-10-CM

## 2017-04-04 DIAGNOSIS — M25661 Stiffness of right knee, not elsewhere classified: Secondary | ICD-10-CM

## 2017-04-04 DIAGNOSIS — R600 Localized edema: Secondary | ICD-10-CM | POA: Diagnosis not present

## 2017-04-04 NOTE — Therapy (Signed)
[facility] [place], [place], [address] Phone: [phone]   Fax:  [phone]  Physical Therapy Treatment  Patient Details  Name: Troy Acosta MRN: [id number] Date of Birth: [birth year] Referring Provider: Esmond Plants, MD  Encounter Date: 04/04/2017      PT End of Session - 04/04/17 0939    Visit Number 7   Number of Visits 20   Date for PT Re-Evaluation 05/21/17   PT Start Time 0900   PT Stop Time 0956   PT Time Calculation (min) 56 min   Activity Tolerance Patient tolerated treatment well   Behavior During Therapy [facility] for tasks assessed/performed      Past Medical History:  Diagnosis Date  . Anxiety   . Arteritis, unspecified ([place])   . Arthritis    hands, knees, feet  . Diaphragmatic hernia without mention of obstruction or gangrene   . Dysrhythmia    afib, ablation- 12/2015, no problems since, off blood thinner- 09/2016  . Essential hypertension, benign   . Headache    had MRI- was consulted with Dr. Jannifer Franklin, but the consult, headache has resolved.  . Hyperplasia of prostate   . Iron deficiency anemia, unspecified   . Malaise and fatigue   . Obesity   . Other and unspecified hyperlipidemia   . Paroxysmal atrial fibrillation (HCC)   . Unspecified hypothyroidism     Past Surgical History:  Procedure Laterality Date  . ELECTROPHYSIOLOGIC STUDY N/A 12/24/2015   Afib ablation by Dr Rayann Heman  . KNEE SURGERY Right   . TOTAL KNEE ARTHROPLASTY Right 03/02/2017   Procedure: RIGHT TOTAL KNEE ARTHROPLASTY;  Surgeon: [name], MD;  Location: [facility];  Service: Orthopedics;  Laterality: Right;    There were no vitals filed for this visit.      Subjective Assessment - 04/04/17 0908    Subjective Patient reported ongoing stiffness in right knee and 4/10 pain in AM and 8/10 in PM   Limitations Walking   How long can you sit comfortably? 2 hours   How long can you stand comfortably? 15 minutes   How long can you  walk comfortably? 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 Yes   Pain Score 4    Pain Location Knee   Pain Orientation Right   Pain Descriptors / Indicators Aching   Pain Type Surgical pain   Pain Onset More than a month ago   Pain Frequency Constant   Aggravating Factors  at night   Pain Relieving Factors pain meds            [facility] PT Assessment - 04/04/17 0001      ROM / Strength   AROM / PROM / Strength AROM;PROM     AROM   AROM Assessment Site Knee   Right/Left Knee Right   Right Knee Extension -11   Right Knee Flexion 90     PROM   PROM Assessment Site Knee   Right/Left Knee Right   Right Knee Extension -6   Right Knee Flexion 103                     [facility] Adult PT Treatment/Exercise - 04/04/17 0001      Knee/Hip Exercises: Aerobic   Nustep L6 x 15 minutes seat progression to seat 11-9     Knee/Hip Exercises: Land  3 minutes     Electrical Stimulation   Electrical Stimulation Location RT knee IFC x 15 mins 1-10hz    Electrical Stimulation Goals Edema;Pain     Vasopneumatic   Number Minutes Vasopneumatic  15 minutes   Vasopnuematic Location  Knee   Vasopneumatic Pressure Medium     Manual Therapy   Manual Therapy Passive ROM   Passive ROM gentle manual PROM for right knee flexion with low load holds then ext with overpressure                  PT Short Term Goals - 04/04/17 0941      PT SHORT TERM GOAL #1   Title pt will be independent with his initial HEP   Time 3   Period Weeks   Status On-going     PT SHORT TERM GOAL #2   Title Pt will improve right knee flexion to >100 degrees actively in order to improve amb.   Time 3   Period Weeks   Status On-going           PT Long Term Goals - 04/04/17 0941      PT LONG TERM GOAL #1   Title Pt will improve his FOTO score from 61% limitation to </= 40% limitation.   Time  8   Period Weeks   Status On-going     PT LONG TERM GOAL #2   Title Pt will be able to amb with no assistive device >/= 1000 feet on community surfaces.   Time 8   Period Weeks   Status On-going     PT LONG TERM GOAL #3   Title Pt will improve his R LE knee flexion and extension to 5/5 in order to improve function.   Time 8   Period Weeks   Status On-going     PT LONG TERM GOAL #4   Title Pt will improve R knee flexion to >/= 125 degrees in order to improve functional mobility.    Time 8   Period Weeks   Status On-going     PT LONG TERM GOAL #5   Title Pt will improve R knee extension to </= 5 degrees of flexion in order to improve gait.    Time 8   Period Weeks   Status On-going     PT LONG TERM GOAL #6   Title Pt will be albe to improve R knee pain </= 3/10 with amb and ADL's.    Time 8   Period Weeks   Status On-going               Plan - 04/04/17 1694    Clinical Impression Statement Patient tolerated treatment well today. Patient has some increased soreness with right knee flexion due to edema in knee. Patient improved with ROM today for both right knee flexion and extention. Today educated patient on elevation and ice after prolong activity and several times a day. Today focused on ROM. Current goals ongoing due to edema, ROM and pain defcits.    Rehab Potential Excellent   PT Frequency 3x / week   PT Duration 8 weeks   PT Treatment/Interventions ADLs/Self Care Home Management;Gait training;Patient/family education;Taping;Vasopneumatic Device;Cryotherapy;Electrical Stimulation;Moist Heat;Ultrasound;Therapeutic exercise;Therapeutic activities;Functional mobility training;Stair training;Manual techniques;Passive range of motion   PT Next Visit Plan cont with POC (MD. Veverly Fells 04/12/17)   Consulted and Agree with Plan of Care Patient      Patient will benefit from skilled therapeutic intervention in order to improve  the following deficits and impairments:   Abnormal gait, Pain, Increased edema, Decreased activity tolerance, Decreased mobility, Difficulty walking, Decreased strength, Decreased range of motion  Visit Diagnosis: Stiffness of right knee, not elsewhere classified  Acute pain of right knee  Localized edema     Problem List Patient Active Problem List   Diagnosis Date Noted  . Status post total knee replacement, right 03/02/2017  . Chronic intractable headache 08/09/2016  . Other specified hypothyroidism 03/02/2016  . A-fib ([place]) 12/24/2015  . Non-ischemic cardiomyopathy ([place]) 11/07/2015  . Long term (current) use of anticoagulants 11/07/2015  . Diaphoresis 11/01/2015  . Chest pain 11/01/2015  . PAF (paroxysmal atrial fibrillation) ([place]) 07/15/2015  . Heart palpitations 07/11/2015  . Dyspnea 02/18/2015  . Anemia, iron deficiency 09/10/2013  . Diaphragmatic hernia without mention of obstruction or gangrene   . Hypothyroidism   . Malaise and fatigue   . Arteritis, unspecified ([place])   . Iron deficiency anemia, unspecified   . Obesity   . BPH (benign prostatic hyperplasia)   . Essential hypertension, benign   . Hyperlipidemia     Charika Mikelson P, PTA 04/04/2017, 9:57 AM  [facility] [place], [place], [address] Phone: [phone]   Fax:  [phone]  Name: Levaughn Puccinelli MRN: [id number] Date of Birth: [birth year]

## 2017-04-06 ENCOUNTER — Ambulatory Visit: Payer: BLUE CROSS/BLUE SHIELD | Admitting: *Deleted

## 2017-04-06 DIAGNOSIS — M25561 Pain in right knee: Secondary | ICD-10-CM | POA: Diagnosis not present

## 2017-04-06 DIAGNOSIS — R600 Localized edema: Secondary | ICD-10-CM | POA: Diagnosis not present

## 2017-04-06 DIAGNOSIS — M25661 Stiffness of right knee, not elsewhere classified: Secondary | ICD-10-CM | POA: Diagnosis not present

## 2017-04-06 NOTE — Therapy (Signed)
[facility] [place], [place], [address] Phone: [phone]   Fax:  [phone]  Physical Therapy Treatment  Patient Details  Name: Troy Acosta MRN: [id number] Date of Birth: 1951/06/25 Referring Provider: Esmond Plants, MD  Encounter Date: 04/06/2017      PT End of Session - 04/06/17 1011    Visit Number 8   Number of Visits 20   Date for PT Re-Evaluation 05/21/17   PT Start Time 0900   PT Stop Time 1001   PT Time Calculation (min) 61 min      Past Medical History:  Diagnosis Date  . Anxiety   . Arteritis, unspecified ([place])   . Arthritis    hands, knees, feet  . Diaphragmatic hernia without mention of obstruction or gangrene   . Dysrhythmia    afib, ablation- 12/2015, no problems since, off blood thinner- 09/2016  . Essential hypertension, benign   . Headache    had MRI- was consulted with Dr. Jannifer Franklin, but the consult, headache has resolved.  . Hyperplasia of prostate   . Iron deficiency anemia, unspecified   . Malaise and fatigue   . Obesity   . Other and unspecified hyperlipidemia   . Paroxysmal atrial fibrillation (HCC)   . Unspecified hypothyroidism     Past Surgical History:  Procedure Laterality Date  . ELECTROPHYSIOLOGIC STUDY N/A 12/24/2015   Afib ablation by Dr Rayann Heman  . KNEE SURGERY Right   . TOTAL KNEE ARTHROPLASTY Right 03/02/2017   Procedure: RIGHT TOTAL KNEE ARTHROPLASTY;  Surgeon: [name], MD;  Location: [facility];  Service: Orthopedics;  Laterality: Right;    There were no vitals filed for this visit.      Subjective Assessment - 04/06/17 0905    Subjective Patient reported ongoing stiffness in right knee and 4/10 pain in AM and 8/10 in PM.    Meds every 4 hrs now really helps   Limitations Walking   How long can you sit comfortably? 2 hours   How long can you stand comfortably? 15 minutes   How long can you walk comfortably? 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 Yes   Pain Score 4    Pain Location Knee   Pain Orientation Right   Pain Descriptors / Indicators Aching                         [facility] Adult PT Treatment/Exercise - 04/06/17 0001      Knee/Hip Exercises: Aerobic   Nustep L6 x 15 minutes seat progression to seat 12-9     Knee/Hip Exercises: Machines for Strengthening   Cybex Knee Extension 20 # x 30   Cybex Knee Flexion 30# x30      Knee/Hip Exercises: Standing   Rocker Board 4 minutes  PF/DF x 2 min; 2x 71min just DF     Modalities   Modalities Electrical Stimulation;Vasopneumatic     Electrical Stimulation   Electrical Stimulation Location RT knee IFC x 15 mins 1-10hz    Electrical Stimulation Goals Edema;Pain     Vasopneumatic   Number Minutes Vasopneumatic  15 minutes   Vasopnuematic Location  Knee   Vasopneumatic Pressure Medium   Vasopneumatic Temperature  34     Manual Therapy   Manual Therapy Passive ROM   Passive ROM gentle manual PROM for right knee flexion with low  load holds then ext with overpressure                  PT Short Term Goals - 04/04/17 0941      PT SHORT TERM GOAL #1   Title pt will be independent with his initial HEP   Time 3   Period Weeks   Status On-going     PT SHORT TERM GOAL #2   Title Pt will improve right knee flexion to >100 degrees actively in order to improve amb.   Time 3   Period Weeks   Status On-going           PT Long Term Goals - 04/04/17 0941      PT LONG TERM GOAL #1   Title Pt will improve his FOTO score from 61% limitation to </= 40% limitation.   Time 8   Period Weeks   Status On-going     PT LONG TERM GOAL #2   Title Pt will be able to amb with no assistive device >/= 1000 feet on community surfaces.   Time 8   Period Weeks   Status On-going     PT LONG TERM GOAL #3   Title Pt will improve his R LE knee flexion and extension to 5/5 in order to improve  function.   Time 8   Period Weeks   Status On-going     PT LONG TERM GOAL #4   Title Pt will improve R knee flexion to >/= 125 degrees in order to improve functional mobility.    Time 8   Period Weeks   Status On-going     PT LONG TERM GOAL #5   Title Pt will improve R knee extension to </= 5 degrees of flexion in order to improve gait.    Time 8   Period Weeks   Status On-going     PT LONG TERM GOAL #6   Title Pt will be albe to improve R knee pain </= 3/10 with amb and ADL's.    Time 8   Period Weeks   Status On-going               Plan - 04/06/17 1021    Clinical Impression Statement Pt did fairly well today and was able to perform all therex and acts with mainly discomfort. His PROM is still limited 10-106 degrees due to tightness. LTGs are ongoing   Rehab Potential Excellent   PT Frequency 3x / week   PT Duration 8 weeks   PT Next Visit Plan cont with POC (MD. Veverly Fells 04/12/17)   PT Home Exercise Plan Quad sets, hamstring stretching, (HEP provided by [facility] with standing hip exercises)   Consulted and Agree with Plan of Care Patient      Patient will benefit from skilled therapeutic intervention in order to improve the following deficits and impairments:  Abnormal gait, Pain, Increased edema, Decreased activity tolerance, Decreased mobility, Difficulty walking, Decreased strength, Decreased range of motion  Visit Diagnosis: Stiffness of right knee, not elsewhere classified  Acute pain of right knee  Localized edema     Problem List Patient Active Problem List   Diagnosis Date Noted  . Status post total knee replacement, right 03/02/2017  . Chronic intractable headache 08/09/2016  . Other specified hypothyroidism 03/02/2016  . A-fib ([place]) 12/24/2015  . Non-ischemic cardiomyopathy ([place]) 11/07/2015  . Long term (current) use of anticoagulants 11/07/2015  . Diaphoresis 11/01/2015  . Chest pain 11/01/2015  .  PAF (paroxysmal atrial fibrillation) ([place])  07/15/2015  . Heart palpitations 07/11/2015  . Dyspnea 02/18/2015  . Anemia, iron deficiency 09/10/2013  . Diaphragmatic hernia without mention of obstruction or gangrene   . Hypothyroidism   . Malaise and fatigue   . Arteritis, unspecified ([place])   . Iron deficiency anemia, unspecified   . Obesity   . BPH (benign prostatic hyperplasia)   . Essential hypertension, benign   . Hyperlipidemia     Olusegun Gerstenberger,CHRIS, PTA 04/06/2017, 12:37 PM  [facility] [place], [place], [address] Phone: [phone]   Fax:  [phone]  Name: Saleh Ulbrich MRN: [id number] Date of Birth: 1951/05/05

## 2017-04-09 ENCOUNTER — Ambulatory Visit: Payer: BLUE CROSS/BLUE SHIELD | Attending: Orthopedic Surgery | Admitting: Physical Therapy

## 2017-04-09 ENCOUNTER — Other Ambulatory Visit: Payer: Self-pay | Admitting: Family Medicine

## 2017-04-09 ENCOUNTER — Encounter: Payer: Self-pay | Admitting: Physical Therapy

## 2017-04-09 DIAGNOSIS — M25661 Stiffness of right knee, not elsewhere classified: Secondary | ICD-10-CM | POA: Insufficient documentation

## 2017-04-09 DIAGNOSIS — R600 Localized edema: Secondary | ICD-10-CM | POA: Insufficient documentation

## 2017-04-09 DIAGNOSIS — M25561 Pain in right knee: Secondary | ICD-10-CM | POA: Insufficient documentation

## 2017-04-09 MED ORDER — LORAZEPAM 0.5 MG PO TABS: ORAL_TABLET | ORAL | 2 refills | Status: DC

## 2017-04-09 NOTE — Telephone Encounter (Signed)
Last filled 01/31/17, last seen 02/07/17. Call in

## 2017-04-09 NOTE — Therapy (Signed)
[facility] [place], [place], [address] Phone: [phone]   Fax:  [phone]  Physical Therapy Treatment  Patient Details  Name: Troy Acosta MRN: [id number] Date of Birth: 04/08/1951 Referring Provider: Esmond Plants, MD  Encounter Date: 04/09/2017      PT End of Session - 04/09/17 0807    Visit Number 9   Number of Visits 20   Date for PT Re-Evaluation 05/21/17   PT Start Time 0730   PT Stop Time 0826   PT Time Calculation (min) 56 min   Activity Tolerance Patient tolerated treatment well   Behavior During Therapy [facility] for tasks assessed/performed      Past Medical History:  Diagnosis Date  . Anxiety   . Arteritis, unspecified ([place])   . Arthritis    hands, knees, feet  . Diaphragmatic hernia without mention of obstruction or gangrene   . Dysrhythmia    afib, ablation- 12/2015, no problems since, off blood thinner- 09/2016  . Essential hypertension, benign   . Headache    had MRI- was consulted with Dr. Jannifer Franklin, but the consult, headache has resolved.  . Hyperplasia of prostate   . Iron deficiency anemia, unspecified   . Malaise and fatigue   . Obesity   . Other and unspecified hyperlipidemia   . Paroxysmal atrial fibrillation (HCC)   . Unspecified hypothyroidism     Past Surgical History:  Procedure Laterality Date  . ELECTROPHYSIOLOGIC STUDY N/A 12/24/2015   Afib ablation by Dr Rayann Heman  . KNEE SURGERY Right   . TOTAL KNEE ARTHROPLASTY Right 03/02/2017   Procedure: RIGHT TOTAL KNEE ARTHROPLASTY;  Surgeon: [name], MD;  Location: [facility];  Service: Orthopedics;  Laterality: Right;    There were no vitals filed for this visit.      Subjective Assessment - 04/09/17 0731    Subjective Patient reported ongoing stiffness in knee esp in AM   Limitations Walking   How long can you sit comfortably? 2 hours   How long can you stand comfortably? 15 minutes   How long can you walk comfortably? 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 Yes   Pain Score 4    Pain Location Knee   Pain Orientation Right   Pain Descriptors / Indicators Aching   Pain Type Surgical pain   Pain Onset More than a month ago   Pain Frequency Constant   Aggravating Factors  at night and ROM in knee   Pain Relieving Factors meds and rest            [facility] PT Assessment - 04/09/17 0001      AROM   AROM Assessment Site Knee   Right/Left Knee Right   Right Knee Extension -10   Right Knee Flexion 92     PROM   PROM Assessment Site Knee   Right/Left Knee Right   Right Knee Extension -6   Right Knee Flexion 103                     [facility] Adult PT Treatment/Exercise - 04/09/17 0001      Knee/Hip Exercises: Aerobic   Nustep L5 x 15 minutes seat progression to seat 11-9     Knee/Hip Exercises: Machines for Strengthening   Cybex Knee Extension 20 # x 30   Cybex Knee Flexion 30# x30  Knee/Hip Exercises: Standing   Rocker Board 2 minutes     Electrical Stimulation   Electrical Stimulation Location RT knee IFC x 15 mins 1-10hz   Electrical Stimulation Goals Edema;Pain     Vasopneumatic   Number Minutes Vasopneumatic  15 minutes   Vasopnuematic Location  Knee   Vasopneumatic Pressure Medium     Manual Therapy   Manual Therapy Passive ROM   Passive ROM gentle manual PROM for right knee flexion with low load holds then ext with overpressure                  PT Short Term Goals - 04/09/17 0810      PT SHORT TERM GOAL #1   Title pt will be independent with his initial HEP   Time 3   Period Weeks   Status Achieved     PT SHORT TERM GOAL #2   Title Pt will improve right knee flexion to >100 degrees actively in order to improve amb.   Time 3   Period Weeks   Status On-going  AROM 92 degrees 04/09/17           PT Long Term Goals - 04/04/17 0941      PT LONG TERM GOAL #1   Title Pt will  improve his FOTO score from 61% limitation to </= 40% limitation.   Time 8   Period Weeks   Status On-going     PT LONG TERM GOAL #2   Title Pt will be able to amb with no assistive device >/= 1000 feet on community surfaces.   Time 8   Period Weeks   Status On-going     PT LONG TERM GOAL #3   Title Pt will improve his R LE knee flexion and extension to 5/5 in order to improve function.   Time 8   Period Weeks   Status On-going     PT LONG TERM GOAL #4   Title Pt will improve R knee flexion to >/= 125 degrees in order to improve functional mobility.    Time 8   Period Weeks   Status On-going     PT LONG TERM GOAL #5   Title Pt will improve R knee extension to </= 5 degrees of flexion in order to improve gait.    Time 8   Period Weeks   Status On-going     PT LONG TERM GOAL #6   Title Pt will be albe to improve R knee pain </= 3/10 with amb and ADL's.    Time 8   Period Weeks   Status On-going               Plan - 04/09/17 6962    Clinical Impression Statement Patient progressing slowly with ROM today, patient has ongoing stiffness and edema in right knee. Patient has increased guarding with manual flexion stretching. Patient tolerated treatment fairly well overall and has reported doing HEP daily as instructed. Patient met STG #1 other goals ongoing due to ROM, pain and edema deficts.    Rehab Potential Excellent   PT Frequency 3x / week   PT Duration 8 weeks   PT Treatment/Interventions ADLs/Self Care Home Management;Gait training;Patient/family education;Taping;Vasopneumatic Device;Cryotherapy;Electrical Stimulation;Moist Heat;Ultrasound;Therapeutic exercise;Therapeutic activities;Functional mobility training;Stair training;Manual techniques;Passive range of motion   PT Next Visit Plan cont with POC (MD. Veverly Fells 04/12/17)   Consulted and Agree with Plan of Care Patient      Patient will benefit from skilled therapeutic intervention in  order to improve the  following deficits and impairments:  Abnormal gait, Pain, Increased edema, Decreased activity tolerance, Decreased mobility, Difficulty walking, Decreased strength, Decreased range of motion  Visit Diagnosis: Stiffness of right knee, not elsewhere classified  Acute pain of right knee  Localized edema     Problem List Patient Active Problem List   Diagnosis Date Noted  . Status post total knee replacement, right 03/02/2017  . Chronic intractable headache 08/09/2016  . Other specified hypothyroidism 03/02/2016  . A-fib ([place]) 12/24/2015  . Non-ischemic cardiomyopathy ([place]) 11/07/2015  . Long term (current) use of anticoagulants 11/07/2015  . Diaphoresis 11/01/2015  . Chest pain 11/01/2015  . PAF (paroxysmal atrial fibrillation) ([place]) 07/15/2015  . Heart palpitations 07/11/2015  . Dyspnea 02/18/2015  . Anemia, iron deficiency 09/10/2013  . Diaphragmatic hernia without mention of obstruction or gangrene   . Hypothyroidism   . Malaise and fatigue   . Arteritis, unspecified ([place])   . Iron deficiency anemia, unspecified   . Obesity   . BPH (benign prostatic hyperplasia)   . Essential hypertension, benign   . Hyperlipidemia    Ladean Raya, PTA 04/09/17 10:13 AM Mali Applegate MPT [facility] [place], [place], [address] Phone: [phone]   Fax:  [phone]  Name: Troy Acosta MRN: [id number] Date of Birth: 1951-05-26

## 2017-04-13 ENCOUNTER — Encounter: Payer: Self-pay | Admitting: Physical Therapy

## 2017-04-13 ENCOUNTER — Ambulatory Visit: Payer: BLUE CROSS/BLUE SHIELD | Admitting: Physical Therapy

## 2017-04-13 DIAGNOSIS — R600 Localized edema: Secondary | ICD-10-CM

## 2017-04-13 DIAGNOSIS — M25561 Pain in right knee: Secondary | ICD-10-CM

## 2017-04-13 DIAGNOSIS — M25661 Stiffness of right knee, not elsewhere classified: Secondary | ICD-10-CM | POA: Diagnosis not present

## 2017-04-13 NOTE — Therapy (Signed)
[facility] [place], [place], [address] Phone: [phone]   Fax:  [phone]  Physical Therapy Treatment  Patient Details  Name: Troy Acosta MRN: [id number] Date of Birth: 06-28-1951 Referring Provider: Esmond Plants, MD  Encounter Date: 04/13/2017      PT End of Session - 04/13/17 0736    Visit Number 10   Number of Visits 20   Date for PT Re-Evaluation 05/21/17   PT Start Time 0730   PT Stop Time 0820   PT Time Calculation (min) 50 min   Activity Tolerance Patient tolerated treatment well   Behavior During Therapy [facility] for tasks assessed/performed      Past Medical History:  Diagnosis Date  . Anxiety   . Arteritis, unspecified ([place])   . Arthritis    hands, knees, feet  . Diaphragmatic hernia without mention of obstruction or gangrene   . Dysrhythmia    afib, ablation- 12/2015, no problems since, off blood thinner- 09/2016  . Essential hypertension, benign   . Headache    had MRI- was consulted with Dr. Jannifer Franklin, but the consult, headache has resolved.  . Hyperplasia of prostate   . Iron deficiency anemia, unspecified   . Malaise and fatigue   . Obesity   . Other and unspecified hyperlipidemia   . Paroxysmal atrial fibrillation (HCC)   . Unspecified hypothyroidism     Past Surgical History:  Procedure Laterality Date  . ELECTROPHYSIOLOGIC STUDY N/A 12/24/2015   Afib ablation by Dr Rayann Heman  . KNEE SURGERY Right   . TOTAL KNEE ARTHROPLASTY Right 03/02/2017   Procedure: RIGHT TOTAL KNEE ARTHROPLASTY;  Surgeon: [name], MD;  Location: [place];  Service: Orthopedics;  Laterality: Right;    There were no vitals filed for this visit.      Subjective Assessment - 04/13/17 0732    Subjective Reports that MD said he was doing good but to practice ROM and walking with ROM more. Reports that he wants him to cut out pain meds "cold [place]" but he had to talk pain pill at midnight secondary to pain.   Limitations  Walking   How long can you sit comfortably? 2 hours   How long can you stand comfortably? 15 minutes   How long can you walk comfortably? 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 Yes   Pain Score 2    Pain Location Knee   Pain Orientation Right   Pain Descriptors / Indicators Sore   Pain Type Surgical pain   Pain Onset More than a month ago            [facility] PT Assessment - 04/13/17 0001      Assessment   Medical Diagnosis Right TKA   Onset Date/Surgical Date 03/02/17   Hand Dominance Right   Next MD Visit 04/12/17   Prior Therapy yes, 5 HHPT visits following surgery     Precautions   Precautions None     Restrictions   Weight Bearing Restrictions No     ROM / Strength   AROM / PROM / Strength AROM     AROM   Overall AROM  Deficits   AROM Assessment Site Knee   Right/Left Knee Right   Right Knee Extension -9   Right Knee Flexion 107  [facility] Adult PT Treatment/Exercise - 04/13/17 0001      Knee/Hip Exercises: Stretches   Passive Hamstring Stretch Right;3 reps;30 seconds     Knee/Hip Exercises: Aerobic   Nustep L7, seat 11 x12 min     Knee/Hip Exercises: Machines for Strengthening   Cybex Knee Extension 20# 3x10 reps   Cybex Knee Flexion 40# 3x10 reps   Cybex Leg Press 2 pl, seat 7, 3x10 reps      Knee/Hip Exercises: Standing   Forward Lunges Right;2 sets;10 reps;3 seconds   Hip Abduction AROM;Both;2 sets;10 reps;Knee straight     Modalities   Modalities Passenger transport manager Location R knee   Electrical Stimulation Action IFC   Electrical Stimulation Parameters 1-10 hz x15 min   Electrical Stimulation Goals Edema;Pain     Vasopneumatic   Number Minutes Vasopneumatic  15 minutes   Vasopnuematic Location  Knee   Vasopneumatic Pressure Medium   Vasopneumatic Temperature  64                   PT Short Term Goals - 04/13/17 0810      PT SHORT TERM GOAL #1   Title pt will be independent with his initial HEP   Time 3   Period Weeks   Status Achieved     PT SHORT TERM GOAL #2   Title Pt will improve right knee flexion to >100 degrees actively in order to improve amb.   Time 3   Period Weeks   Status Achieved  AROM R knee flexion 107 deg 04/13/2017           PT Long Term Goals - 04/13/17 0817      PT LONG TERM GOAL #1   Title Pt will improve his FOTO score from 61% limitation to </= 40% limitation.   Time 8   Period Weeks   Status Achieved  FOTO score 32% current limitation 04/13/2017     PT LONG TERM GOAL #2   Title Pt will be able to amb with no assistive device >/= 1000 feet on community surfaces.   Time 8   Period Weeks   Status On-going     PT LONG TERM GOAL #3   Title Pt will improve his R LE knee flexion and extension to 5/5 in order to improve function.   Time 8   Period Weeks   Status On-going     PT LONG TERM GOAL #4   Title Pt will improve R knee flexion to >/= 125 degrees in order to improve functional mobility.    Time 8   Period Weeks   Status On-going  AROM R knee flexion 107 deg 04/13/2017     PT LONG TERM GOAL #5   Title Pt will improve R knee extension to </= 5 degrees of flexion in order to improve gait.    Time 8   Period Weeks   Status On-going  -9 deg extension R knee 04/13/2017     PT LONG TERM GOAL #6   Title Pt will be albe to improve R knee pain </= 3/10 with amb and ADL's.    Time 8   Period Weeks   Status On-going               Plan - 04/13/17 5573    Clinical Impression Statement Patient continues to progress and showed great improvement in R knee ROM today. Patient guided through machine strengthening  that would promote ROM as well without complaint from patient other than knee extension being more difficult. Patient encouraged to ambulate with heel/toe to further emphasize R knee ROM  during ambulation which was also demonstrated to patient during treatment. AROM R knee measured as 9-107 deg today in supine. Normal modalities response noted following removal of the modalities.   Rehab Potential Excellent   PT Frequency 3x / week   PT Duration 8 weeks   PT Treatment/Interventions ADLs/Self Care Home Management;Gait training;Patient/family education;Taping;Vasopneumatic Device;Cryotherapy;Electrical Stimulation;Moist Heat;Ultrasound;Therapeutic exercise;Therapeutic activities;Functional mobility training;Stair training;Manual techniques;Passive range of motion   PT Next Visit Plan cont with POC (MD. Veverly Fells 04/12/17)   PT Home Exercise Plan Quad sets, hamstring stretching, (HEP provided by HHPT with standing hip exercises)   Consulted and Agree with Plan of Care Patient      Patient will benefit from skilled therapeutic intervention in order to improve the following deficits and impairments:  Abnormal gait, Pain, Increased edema, Decreased activity tolerance, Decreased mobility, Difficulty walking, Decreased strength, Decreased range of motion  Visit Diagnosis: Stiffness of right knee, not elsewhere classified  Acute pain of right knee  Localized edema     Problem List Patient Active Problem List   Diagnosis Date Noted  . Status post total knee replacement, right 03/02/2017  . Chronic intractable headache 08/09/2016  . Other specified hypothyroidism 03/02/2016  . A-fib ([place]) 12/24/2015  . Non-ischemic cardiomyopathy ([place]) 11/07/2015  . Long term (current) use of anticoagulants 11/07/2015  . Diaphoresis 11/01/2015  . Chest pain 11/01/2015  . PAF (paroxysmal atrial fibrillation) ([place]) 07/15/2015  . Heart palpitations 07/11/2015  . Dyspnea 02/18/2015  . Anemia, iron deficiency 09/10/2013  . Diaphragmatic hernia without mention of obstruction or gangrene   . Hypothyroidism   . Malaise and fatigue   . Arteritis, unspecified ([place])   . Iron deficiency anemia,  unspecified   . Obesity   . BPH (benign prostatic hyperplasia)   . Essential hypertension, benign   . Hyperlipidemia     Wynelle Fanny, PTA 04/13/2017, 8:25 AM  [facility] [address] [place], [place], [address] Phone: [phone]   Fax:  [phone]  Name: Troy Acosta MRN: [id number] Date of Birth: Sep 23, 1951

## 2017-04-16 ENCOUNTER — Ambulatory Visit: Payer: BLUE CROSS/BLUE SHIELD | Admitting: Physical Therapy

## 2017-04-16 DIAGNOSIS — M25561 Pain in right knee: Secondary | ICD-10-CM

## 2017-04-16 DIAGNOSIS — M25661 Stiffness of right knee, not elsewhere classified: Secondary | ICD-10-CM | POA: Diagnosis not present

## 2017-04-16 DIAGNOSIS — R600 Localized edema: Secondary | ICD-10-CM | POA: Diagnosis not present

## 2017-04-16 NOTE — Therapy (Signed)
[facility] [place], [place], [address] Phone: [phone]   Fax:  [phone]  Physical Therapy Treatment  Patient Details  Name: Troy Acosta MRN: [id number] Date of Birth: Mar 04, 1951 Referring Provider: Esmond Plants, MD  Encounter Date: 04/16/2017      PT End of Session - 04/16/17 0813    Visit Number 11   Number of Visits 20   Date for PT Re-Evaluation 05/21/17   PT Start Time 0730   PT Stop Time 0824   PT Time Calculation (min) 54 min   Activity Tolerance Patient tolerated treatment well   Behavior During Therapy [facility] for tasks assessed/performed      Past Medical History:  Diagnosis Date  . Anxiety   . Arteritis, unspecified ([place])   . Arthritis    hands, knees, feet  . Diaphragmatic hernia without mention of obstruction or gangrene   . Dysrhythmia    afib, ablation- 12/2015, no problems since, off blood thinner- 09/2016  . Essential hypertension, benign   . Headache    had MRI- was consulted with Dr. Jannifer Franklin, but the consult, headache has resolved.  . Hyperplasia of prostate   . Iron deficiency anemia, unspecified   . Malaise and fatigue   . Obesity   . Other and unspecified hyperlipidemia   . Paroxysmal atrial fibrillation (HCC)   . Unspecified hypothyroidism     Past Surgical History:  Procedure Laterality Date  . ELECTROPHYSIOLOGIC STUDY N/A 12/24/2015   Afib ablation by Dr Rayann Heman  . KNEE SURGERY Right   . TOTAL KNEE ARTHROPLASTY Right 03/02/2017   Procedure: RIGHT TOTAL KNEE ARTHROPLASTY;  Surgeon: [name], MD;  Location: [place];  Service: Orthopedics;  Laterality: Right;    There were no vitals filed for this visit.      Subjective Assessment - 04/16/17 0748    Subjective My pain is around a 4 or 5.   Pain Score 5    Pain Location Knee   Pain Orientation Right   Pain Descriptors / Indicators Sore   Pain Type Surgical pain   Pain Onset More than a month ago                          [facility] Adult PT Treatment/Exercise - 04/16/17 0001      Exercises   Exercises Knee/Hip     Knee/Hip Exercises: Aerobic   Nustep Level 6 x 15 minutes.     Knee/Hip Exercises: Machines for Strengthening   Cybex Knee Extension 10# x 2 minutes.   Cybex Knee Flexion 40# x 2 minutes.   Cybex Leg Press 3 plates x 3 minutes.     Knee/Hip Exercises: Standing   Other Standing Knee Exercises Rockerboard x 3 minutes.     Modalities   Modalities Location manager Stimulation Location Right knee.   Electrical Stimulation Action IFC   Electrical Stimulation Parameters 80-150 Hz x 15 minutes.   Electrical Stimulation Goals Pain     Vasopneumatic   Number Minutes Vasopneumatic  15 minutes   Vasopnuematic Location  --  Right knee.   Vasopneumatic Pressure Medium                  PT Short Term Goals - 04/13/17 0810      PT SHORT TERM GOAL #1   Title pt will be independent with his initial HEP   Time 3  Period Weeks   Status Achieved     PT SHORT TERM GOAL #2   Title Pt will improve right knee flexion to >100 degrees actively in order to improve amb.   Time 3   Period Weeks   Status Achieved  AROM R knee flexion 107 deg 04/13/2017           PT Long Term Goals - 04/13/17 0817      PT LONG TERM GOAL #1   Title Pt will improve his FOTO score from 61% limitation to </= 40% limitation.   Time 8   Period Weeks   Status Achieved  FOTO score 32% current limitation 04/13/2017     PT LONG TERM GOAL #2   Title Pt will be able to amb with no assistive device >/= 1000 feet on community surfaces.   Time 8   Period Weeks   Status On-going     PT LONG TERM GOAL #3   Title Pt will improve his R LE knee flexion and extension to 5/5 in order to improve function.   Time 8   Period Weeks   Status On-going     PT LONG TERM GOAL #4   Title Pt will improve R knee flexion to >/= 125  degrees in order to improve functional mobility.    Time 8   Period Weeks   Status On-going  AROM R knee flexion 107 deg 04/13/2017     PT LONG TERM GOAL #5   Title Pt will improve R knee extension to </= 5 degrees of flexion in order to improve gait.    Time 8   Period Weeks   Status On-going  -9 deg extension R knee 04/13/2017     PT LONG TERM GOAL #6   Title Pt will be albe to improve R knee pain </= 3/10 with amb and ADL's.    Time 8   Period Weeks   Status On-going               Plan - 04/16/17 0813    Clinical Impression Statement Overall patient doing well.  Needs continued work on extension.      Patient will benefit from skilled therapeutic intervention in order to improve the following deficits and impairments:  Abnormal gait, Pain, Increased edema, Decreased activity tolerance, Decreased mobility, Difficulty walking, Decreased strength, Decreased range of motion  Visit Diagnosis: Stiffness of right knee, not elsewhere classified  Acute pain of right knee  Localized edema     Problem List Patient Active Problem List   Diagnosis Date Noted  . Status post total knee replacement, right 03/02/2017  . Chronic intractable headache 08/09/2016  . Other specified hypothyroidism 03/02/2016  . A-fib ([place]) 12/24/2015  . Non-ischemic cardiomyopathy ([place]) 11/07/2015  . Long term (current) use of anticoagulants 11/07/2015  . Diaphoresis 11/01/2015  . Chest pain 11/01/2015  . PAF (paroxysmal atrial fibrillation) ([place]) 07/15/2015  . Heart palpitations 07/11/2015  . Dyspnea 02/18/2015  . Anemia, iron deficiency 09/10/2013  . Diaphragmatic hernia without mention of obstruction or gangrene   . Hypothyroidism   . Malaise and fatigue   . Arteritis, unspecified ([place])   . Iron deficiency anemia, unspecified   . Obesity   . BPH (benign prostatic hyperplasia)   . Essential hypertension, benign   . Hyperlipidemia     Brendalee Matthies, [place] MPT 04/16/2017, 8:30 AM  [facility] [place], [place], [address] Phone: [phone]   Fax:  [phone]  Name: Troy Acosta MRN: [id number] Date of Birth: October 07, 1951

## 2017-04-18 ENCOUNTER — Ambulatory Visit: Payer: BLUE CROSS/BLUE SHIELD | Admitting: Physical Therapy

## 2017-04-18 ENCOUNTER — Encounter: Payer: Self-pay | Admitting: Physical Therapy

## 2017-04-18 DIAGNOSIS — M25661 Stiffness of right knee, not elsewhere classified: Secondary | ICD-10-CM

## 2017-04-18 DIAGNOSIS — R600 Localized edema: Secondary | ICD-10-CM | POA: Diagnosis not present

## 2017-04-18 DIAGNOSIS — M25561 Pain in right knee: Secondary | ICD-10-CM

## 2017-04-18 NOTE — Therapy (Signed)
[facility] [place], [place], [address] Phone: [phone]   Fax:  [phone]  Physical Therapy Treatment  Patient Details  Name: Troy Acosta MRN: [id number] Date of Birth: 09/22/1951 Referring Provider: Esmond Plants, MD  Encounter Date: 04/18/2017      PT End of Session - 04/18/17 0731    Visit Number 12   Number of Visits 20   Date for PT Re-Evaluation 05/21/17   PT Start Time 0730   PT Stop Time 0820   PT Time Calculation (min) 50 min   Activity Tolerance Patient tolerated treatment well   Behavior During Therapy [facility] for tasks assessed/performed      Past Medical History:  Diagnosis Date  . Anxiety   . Arteritis, unspecified ([place])   . Arthritis    hands, knees, feet  . Diaphragmatic hernia without mention of obstruction or gangrene   . Dysrhythmia    afib, ablation- 12/2015, no problems since, off blood thinner- 09/2016  . Essential hypertension, benign   . Headache    had MRI- was consulted with Dr. Jannifer Franklin, but the consult, headache has resolved.  . Hyperplasia of prostate   . Iron deficiency anemia, unspecified   . Malaise and fatigue   . Obesity   . Other and unspecified hyperlipidemia   . Paroxysmal atrial fibrillation (HCC)   . Unspecified hypothyroidism     Past Surgical History:  Procedure Laterality Date  . ELECTROPHYSIOLOGIC STUDY N/A 12/24/2015   Afib ablation by Dr Rayann Heman  . KNEE SURGERY Right   . TOTAL KNEE ARTHROPLASTY Right 03/02/2017   Procedure: RIGHT TOTAL KNEE ARTHROPLASTY;  Surgeon: [name], MD;  Location: [place];  Service: Orthopedics;  Laterality: Right;    There were no vitals filed for this visit.      Subjective Assessment - 04/18/17 0726    Subjective Reports that pain is better just very very stiff.   Limitations Walking   How long can you sit comfortably? 2 hours   How long can you stand comfortably? 15 minutes   How long can you walk comfortably? 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 Yes   Pain Score 5    Pain Location Knee   Pain Orientation Right   Pain Descriptors / Indicators Other (Comment)  Stiffness   Pain Type Surgical pain   Pain Onset More than a month ago            [facility] PT Assessment - 04/18/17 0001      Assessment   Medical Diagnosis Right TKA   Onset Date/Surgical Date 03/02/17   Hand Dominance Right   Next MD Visit 05/16/2017   Prior Therapy yes, 5 HHPT visits following surgery     Precautions   Precautions None     Restrictions   Weight Bearing Restrictions No     ROM / Strength   AROM / PROM / Strength AROM     AROM   Overall AROM  Deficits   AROM Assessment Site Knee   Right/Left Knee Right   Right Knee Extension -8   Right Knee Flexion 105                     [facility] Adult PT Treatment/Exercise - 04/18/17 0001      Knee/Hip Exercises: Stretches   Passive Hamstring Stretch Right;3 reps;30 seconds  Knee/Hip Exercises: Aerobic   Nustep L6 x12 min     Knee/Hip Exercises: Machines for Strengthening   Cybex Knee Extension 10# 3x10 reps   Cybex Knee Flexion 40# 3x10 reps   Cybex Leg Press 3 pl, seat 9, 3x10 reps     Knee/Hip Exercises: Standing   Forward Lunges Right;2 sets;10 reps;3 seconds   Rocker Board 3 minutes     Modalities   Modalities Electrical Stimulation;Vasopneumatic     Acupuncturist Location R knee   Electrical Stimulation Action IFC   Electrical Stimulation Parameters 1-10 hz x15 min   Electrical Stimulation Goals Pain     Vasopneumatic   Number Minutes Vasopneumatic  15 minutes   Vasopnuematic Location  Knee   Vasopneumatic Pressure Medium   Vasopneumatic Temperature  60     Manual Therapy   Manual Therapy Passive ROM   Passive ROM gentle manual PROM for right knee flexion with low load holds then ext with overpressure                   PT Short Term Goals - 04/13/17 0810      PT SHORT TERM GOAL #1   Title pt will be independent with his initial HEP   Time 3   Period Weeks   Status Achieved     PT SHORT TERM GOAL #2   Title Pt will improve right knee flexion to >100 degrees actively in order to improve amb.   Time 3   Period Weeks   Status Achieved  AROM R knee flexion 107 deg 04/13/2017           PT Long Term Goals - 04/13/17 0817      PT LONG TERM GOAL #1   Title Pt will improve his FOTO score from 61% limitation to </= 40% limitation.   Time 8   Period Weeks   Status Achieved  FOTO score 32% current limitation 04/13/2017     PT LONG TERM GOAL #2   Title Pt will be able to amb with no assistive device >/= 1000 feet on community surfaces.   Time 8   Period Weeks   Status On-going     PT LONG TERM GOAL #3   Title Pt will improve his R LE knee flexion and extension to 5/5 in order to improve function.   Time 8   Period Weeks   Status On-going     PT LONG TERM GOAL #4   Title Pt will improve R knee flexion to >/= 125 degrees in order to improve functional mobility.    Time 8   Period Weeks   Status On-going  AROM R knee flexion 107 deg 04/13/2017     PT LONG TERM GOAL #5   Title Pt will improve R knee extension to </= 5 degrees of flexion in order to improve gait.    Time 8   Period Weeks   Status On-going  -9 deg extension R knee 04/13/2017     PT LONG TERM GOAL #6   Title Pt will be albe to improve R knee pain </= 3/10 with amb and ADL's.    Time 8   Period Weeks   Status On-going               Plan - 04/18/17 0808    Clinical Impression Statement Patient tolerated today's treatment well today although he arrived with increased stiffness of the R knee today. Patient progressed  through machine strengthening in efforts of increasing strength and ROM without complaint. Patient stiffness also notable during PROM of R knee in both flexion and extension. AROM of R  knee measured 8-105 deg. Normal modalities response noted following removal of the modalities.   Rehab Potential Excellent   PT Frequency 3x / week   PT Duration 8 weeks   PT Treatment/Interventions ADLs/Self Care Home Management;Gait training;Patient/family education;Taping;Vasopneumatic Device;Cryotherapy;Electrical Stimulation;Moist Heat;Ultrasound;Therapeutic exercise;Therapeutic activities;Functional mobility training;Stair training;Manual techniques;Passive range of motion   PT Next Visit Plan Continue with R knee ROM focus with modalities and PRN per MPT POC.   PT Home Exercise Plan Quad sets, hamstring stretching, (HEP provided by HHPT with standing hip exercises)   Consulted and Agree with Plan of Care Patient      Patient will benefit from skilled therapeutic intervention in order to improve the following deficits and impairments:  Abnormal gait, Pain, Increased edema, Decreased activity tolerance, Decreased mobility, Difficulty walking, Decreased strength, Decreased range of motion  Visit Diagnosis: Stiffness of right knee, not elsewhere classified  Acute pain of right knee  Localized edema     Problem List Patient Active Problem List   Diagnosis Date Noted  . Status post total knee replacement, right 03/02/2017  . Chronic intractable headache 08/09/2016  . Other specified hypothyroidism 03/02/2016  . A-fib ([place]) 12/24/2015  . Non-ischemic cardiomyopathy ([place]) 11/07/2015  . Long term (current) use of anticoagulants 11/07/2015  . Diaphoresis 11/01/2015  . Chest pain 11/01/2015  . PAF (paroxysmal atrial fibrillation) ([place]) 07/15/2015  . Heart palpitations 07/11/2015  . Dyspnea 02/18/2015  . Anemia, iron deficiency 09/10/2013  . Diaphragmatic hernia without mention of obstruction or gangrene   . Hypothyroidism   . Malaise and fatigue   . Arteritis, unspecified ([place])   . Iron deficiency anemia, unspecified   . Obesity   . BPH (benign prostatic hyperplasia)   .  Essential hypertension, benign   . Hyperlipidemia     Wynelle Fanny, PTA 04/18/2017, 8:48 AM  [facility] [address] [place], [place], [address] Phone: [phone]   Fax:  [phone]  Name: Troy Acosta MRN: [id number] Date of Birth: 09-24-1951

## 2017-04-19 ENCOUNTER — Encounter: Payer: Self-pay | Admitting: Family Medicine

## 2017-04-19 ENCOUNTER — Ambulatory Visit (INDEPENDENT_AMBULATORY_CARE_PROVIDER_SITE_OTHER): Payer: BLUE CROSS/BLUE SHIELD | Admitting: Family Medicine

## 2017-04-19 VITALS — BP 119/80 | HR 74 | Temp 98.6°F | Ht 71.0 in | Wt 244.0 lb

## 2017-04-19 DIAGNOSIS — S335XXA Sprain of ligaments of lumbar spine, initial encounter: Secondary | ICD-10-CM | POA: Diagnosis not present

## 2017-04-19 NOTE — Progress Notes (Signed)
BP 119/80   Pulse 74   Temp 98.6 F (37 C) (Oral)   Ht 5\' 11"  (1.803 m)   Wt 244 lb (110.7 kg)   BMI 34.03 kg/m    Subjective:    Patient ID: Troy Acosta, male    DOB: [birth year], 66 y.o.   MRN: [id number]  HPI: Troy Acosta is a 66 y.o. male presenting on 04/19/2017 for Back Pain (pt here today c/o lower back pain after mowing for about 30 minutes today. He had total knee replacement of the right knee May 25th.)   HPI Left lower back. Patient comes in today complaining of left lower back pain that started after mowing the lawn earlier today. He says that is very tight and stiff in that left side after mowing the lawn and was having a lot of pain and some of it was sharp and stabbing spasming along with it and it was not improving so that is why he called to come in. Then he did find some of his baclofen and hydromorphone that he had from a surgery and he took one of each and now he says he is feeling a lot better here in the office currently. He says his pain is now mild. He denies any radiation or numbness or weakness. He denies any loss of bowel or loss of bladder.  Relevant past medical, surgical, family and social history reviewed and updated as indicated. Interim medical history since our last visit reviewed. Allergies and medications reviewed and updated.  Review of Systems  Constitutional: Negative for chills and fever.  Respiratory: Negative for shortness of breath and wheezing.   Cardiovascular: Negative for chest pain and leg swelling.  Gastrointestinal: Negative for abdominal pain.  Musculoskeletal: Positive for back pain. Negative for arthralgias and gait problem.  Skin: Negative for rash.  All other systems reviewed and are negative.   Per HPI unless specifically indicated above   Allergies as of 04/19/2017   No Known Allergies     Medication List       Accurate as of 04/19/17  5:33 PM. Always use your most recent med list.          amLODipine 5 MG  tablet Commonly known as:  NORVASC Take 1 tablet (5 mg total) by mouth daily.   aspirin 81 MG chewable tablet Commonly known as:  ASPIRIN CHILDRENS Chew 1 tablet (81 mg total) by mouth 2 (two) times daily.   hydrochlorothiazide 25 MG tablet Commonly known as:  HYDRODIURIL Take 1 tablet (25 mg total) by mouth daily.   HYDROmorphone 2 MG tablet Commonly known as:  DILAUDID Take 1-2 tablets (2-4 mg total) by mouth every 4 (four) hours as needed for severe pain.   ibuprofen 800 MG tablet Commonly known as:  ADVIL,MOTRIN Take 800 mg by mouth 2 (two) times daily.   iron polysaccharides 150 MG capsule Commonly known as:  FERREX 150 Take 1 capsule (150 mg total) by mouth daily.   levothyroxine 25 MCG tablet Commonly known as:  SYNTHROID, LEVOTHROID TAKE ONE-HALF TABLET BY MOUTH ONCE DAILY BEFORE BREAKFAST   levothyroxine 150 MCG tablet Commonly known as:  SYNTHROID, LEVOTHROID Take 1 tablet (150 mcg total) by mouth daily before breakfast.   LORazepam 0.5 MG tablet Commonly known as:  ATIVAN TAKE ONE TABLET BY MOUTH ONCE DAILY AS NEEDED   methocarbamol 500 MG tablet Commonly known as:  ROBAXIN Take 1 tablet (500 mg total) by mouth 3 (three) times daily as needed.  metoprolol tartrate 25 MG tablet Commonly known as:  LOPRESSOR Take 12.5 mg by mouth daily as needed (Elevated heart rate).   olmesartan 40 MG tablet Commonly known as:  BENICAR Take 0.5 tablets (20 mg total) by mouth 2 (two) times daily.   rosuvastatin 5 MG tablet Commonly known as:  CRESTOR Take 1 tablet (5 mg total) by mouth daily.   Vitamin D3 1000 units Caps Take 1,000 Units by mouth at bedtime.          Objective:    BP 119/80   Pulse 74   Temp 98.6 F (37 C) (Oral)   Ht 5\' 11"  (1.803 m)   Wt 244 lb (110.7 kg)   BMI 34.03 kg/m   Wt Readings from Last 3 Encounters:  04/19/17 244 lb (110.7 kg)  03/30/17 243 lb (110.2 kg)  03/02/17 244 lb 12.8 oz (111 kg)    Physical Exam   Constitutional: He is oriented to person, place, and time. He appears well-developed and well-nourished. No distress.  Eyes: Conjunctivae are normal. No scleral icterus.  Musculoskeletal: Normal range of motion. He exhibits no edema.       Lumbar back: He exhibits tenderness. He exhibits normal range of motion, no bony tenderness, no edema and no deformity.       Back:  Neurological: He is alert and oriented to person, place, and time. Coordination normal.  Skin: Skin is warm and dry. No rash noted. He is not diaphoretic.  Psychiatric: He has a normal mood and affect. His behavior is normal.  Nursing note and vitals reviewed.       Assessment & Plan:   Problem List Items Addressed This Visit    None    Visit Diagnoses    Lumbar sprain, initial encounter    -  Primary   Patient are to has a muscle relaxer, recommend stretching and or TENS unit that he can buy over-the-counter.       Follow up plan: Return if symptoms worsen or fail to improve.  Counseling provided for all of the vaccine components No orders of the defined types were placed in this encounter.   Caryl Pina, MD [facility] 04/19/2017, 5:33 PM

## 2017-04-20 ENCOUNTER — Ambulatory Visit: Payer: BLUE CROSS/BLUE SHIELD | Admitting: Physical Therapy

## 2017-04-20 ENCOUNTER — Encounter: Payer: Self-pay | Admitting: Physical Therapy

## 2017-04-20 DIAGNOSIS — M25561 Pain in right knee: Secondary | ICD-10-CM | POA: Diagnosis not present

## 2017-04-20 DIAGNOSIS — R600 Localized edema: Secondary | ICD-10-CM

## 2017-04-20 DIAGNOSIS — M25661 Stiffness of right knee, not elsewhere classified: Secondary | ICD-10-CM | POA: Diagnosis not present

## 2017-04-20 NOTE — Therapy (Signed)
[facility] [place], [place], [address] Phone: [phone]   Fax:  [phone]  Physical Therapy Treatment  Patient Details  Name: Troy Acosta MRN: [id number] Date of Birth: 18-Aug-1951 Referring Provider: Esmond Plants, MD  Encounter Date: 04/20/2017      PT End of Session - 04/20/17 0740    Visit Number 13   Number of Visits 20   Date for PT Re-Evaluation 05/21/17   PT Start Time 0730   PT Stop Time 0823   PT Time Calculation (min) 53 min   Activity Tolerance Patient tolerated treatment well   Behavior During Therapy [facility] for tasks assessed/performed      Past Medical History:  Diagnosis Date  . Anxiety   . Arteritis, unspecified ([place])   . Arthritis    hands, knees, feet  . Diaphragmatic hernia without mention of obstruction or gangrene   . Dysrhythmia    afib, ablation- 12/2015, no problems since, off blood thinner- 09/2016  . Essential hypertension, benign   . Headache    had MRI- was consulted with Dr. Jannifer Franklin, but the consult, headache has resolved.  . Hyperplasia of prostate   . Iron deficiency anemia, unspecified   . Malaise and fatigue   . Obesity   . Other and unspecified hyperlipidemia   . Paroxysmal atrial fibrillation (HCC)   . Unspecified hypothyroidism     Past Surgical History:  Procedure Laterality Date  . ELECTROPHYSIOLOGIC STUDY N/A 12/24/2015   Afib ablation by Dr Rayann Heman  . KNEE SURGERY Right   . TOTAL KNEE ARTHROPLASTY Right 03/02/2017   Procedure: RIGHT TOTAL KNEE ARTHROPLASTY;  Surgeon: [name], MD;  Location: [facility];  Service: Orthopedics;  Laterality: Right;    There were no vitals filed for this visit.      Subjective Assessment - 04/20/17 0730    Subjective Reports that his knee is stiff as ever and reports that he also pulled a muscle in his back and doesn't know what he'll be able to do.   Limitations Walking   How long can you sit comfortably? 2 hours   How long can you  stand comfortably? 15 minutes   How long can you walk comfortably? 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 Other (Comment)  No pain assessment provided by patient            [facility] PT Assessment - 04/20/17 0001      Assessment   Medical Diagnosis Right TKA   Onset Date/Surgical Date 03/02/17   Hand Dominance Right   Next MD Visit 05/16/2017   Prior Therapy yes, 5 HHPT visits following surgery     Precautions   Precautions None     Restrictions   Weight Bearing Restrictions No     ROM / Strength   AROM / PROM / Strength AROM     AROM   Overall AROM  Deficits   AROM Assessment Site Knee   Right/Left Knee Right   Right Knee Extension -8   Right Knee Flexion 110                     [facility] Adult PT Treatment/Exercise - 04/20/17 0001      Knee/Hip Exercises: Aerobic   Nustep L6 x15 min     Knee/Hip Exercises: Machines for Strengthening   Cybex Knee Extension 10# 3x10 reps  Cybex Knee Flexion 40# 3x10 reps   Cybex Leg Press 3 pl, seat 9, 3x10 reps     Knee/Hip Exercises: Standing   Forward Lunges Right;2 sets;10 reps;3 seconds   Rocker Board 2 minutes     Modalities   Modalities Passenger transport manager Location R knee   Electrical Stimulation Action IFC   Electrical Stimulation Parameters 1-10 hz x15 min   Electrical Stimulation Goals Pain     Vasopneumatic   Number Minutes Vasopneumatic  15 minutes   Vasopnuematic Location  Knee   Vasopneumatic Pressure Medium   Vasopneumatic Temperature  67     Manual Therapy   Manual Therapy Passive ROM   Passive ROM PROM of R knee into flexion and ext with holds at end range; passive R HS stretch 3x30 sec                  PT Short Term Goals - 04/13/17 0810      PT SHORT TERM GOAL #1   Title pt will be independent with his initial HEP   Time 3    Period Weeks   Status Achieved     PT SHORT TERM GOAL #2   Title Pt will improve right knee flexion to >100 degrees actively in order to improve amb.   Time 3   Period Weeks   Status Achieved  AROM R knee flexion 107 deg 04/13/2017           PT Long Term Goals - 04/20/17 0815      PT LONG TERM GOAL #1   Title Pt will improve his FOTO score from 61% limitation to </= 40% limitation.   Time 8   Period Weeks   Status Achieved  FOTO score 32% current limitation 04/13/2017     PT LONG TERM GOAL #2   Title Pt will be able to amb with no assistive device >/= 1000 feet on community surfaces.   Time 8   Period Weeks   Status Achieved  No AD used per patient report 04/20/2017     PT LONG TERM GOAL #3   Title Pt will improve his R LE knee flexion and extension to 5/5 in order to improve function.   Time 8   Period Weeks   Status On-going     PT LONG TERM GOAL #4   Title Pt will improve R knee flexion to >/= 125 degrees in order to improve functional mobility.    Time 8   Period Weeks   Status On-going  AROM R knee flexion 110 deg 04/20/2017     PT LONG TERM GOAL #5   Title Pt will improve R knee extension to </= 5 degrees of flexion in order to improve gait.    Time 8   Period Weeks   Status On-going  -9 deg extension R knee 04/13/2017     PT LONG TERM GOAL #6   Title Pt will be albe to improve R knee pain </= 3/10 with amb and ADL's.    Time 8   Period Weeks   Status On-going               Plan - 04/20/17 2952    Clinical Impression Statement Patient tolerated today's treatment well although limited with ROM. Patient still able to complete all exercises as directed although slight difficulty with forward lunges secondary to ROM limitations. 100 deg R knee flexion measured during  forward lunges. AROM of R knee measured as 8-110 deg in supine following passive HS stretch and PROM of R knee. Normal modalities response noted following removal of the modalities.   Rehab  Potential Excellent   PT Frequency 3x / week   PT Duration 8 weeks   PT Treatment/Interventions ADLs/Self Care Home Management;Gait training;Patient/family education;Taping;Vasopneumatic Device;Cryotherapy;Electrical Stimulation;Moist Heat;Ultrasound;Therapeutic exercise;Therapeutic activities;Functional mobility training;Stair training;Manual techniques;Passive range of motion   PT Next Visit Plan Continue with R knee ROM focus with modalities and PRN per MPT POC.   PT Home Exercise Plan Quad sets, hamstring stretching, (HEP provided by HHPT with standing hip exercises)   Consulted and Agree with Plan of Care Patient      Patient will benefit from skilled therapeutic intervention in order to improve the following deficits and impairments:  Abnormal gait, Pain, Increased edema, Decreased activity tolerance, Decreased mobility, Difficulty walking, Decreased strength, Decreased range of motion  Visit Diagnosis: Stiffness of right knee, not elsewhere classified  Acute pain of right knee  Localized edema     Problem List Patient Active Problem List   Diagnosis Date Noted  . Status post total knee replacement, right 03/02/2017  . Chronic intractable headache 08/09/2016  . Other specified hypothyroidism 03/02/2016  . A-fib ([place]) 12/24/2015  . Non-ischemic cardiomyopathy ([place]) 11/07/2015  . Long term (current) use of anticoagulants 11/07/2015  . Diaphoresis 11/01/2015  . Chest pain 11/01/2015  . PAF (paroxysmal atrial fibrillation) ([place]) 07/15/2015  . Heart palpitations 07/11/2015  . Dyspnea 02/18/2015  . Anemia, iron deficiency 09/10/2013  . Diaphragmatic hernia without mention of obstruction or gangrene   . Hypothyroidism   . Malaise and fatigue   . Arteritis, unspecified ([place])   . Iron deficiency anemia, unspecified   . Obesity   . BPH (benign prostatic hyperplasia)   . Essential hypertension, benign   . Hyperlipidemia     Wynelle Fanny, PTA 04/20/2017, 8:27 AM  [facility] [address] [place], [place], [address] Phone: [phone]   Fax:  [phone]  Name: Graig Hessling MRN: [id number] Date of Birth: 06-11-1951

## 2017-04-23 ENCOUNTER — Ambulatory Visit: Payer: BLUE CROSS/BLUE SHIELD | Admitting: Physical Therapy

## 2017-04-23 ENCOUNTER — Encounter: Payer: Self-pay | Admitting: Physical Therapy

## 2017-04-23 DIAGNOSIS — R600 Localized edema: Secondary | ICD-10-CM | POA: Diagnosis not present

## 2017-04-23 DIAGNOSIS — M25661 Stiffness of right knee, not elsewhere classified: Secondary | ICD-10-CM | POA: Diagnosis not present

## 2017-04-23 DIAGNOSIS — M25561 Pain in right knee: Secondary | ICD-10-CM | POA: Diagnosis not present

## 2017-04-23 NOTE — Therapy (Signed)
[facility] [place], [place], [address] Phone: [phone]   Fax:  [phone]  Physical Therapy Treatment  Patient Details  Name: Troy Acosta MRN: [id number] Date of Birth: 01-20-1951 Referring Provider: Esmond Plants, MD  Encounter Date: 04/23/2017      PT End of Session - 04/23/17 0737    Visit Number 14   Number of Visits 20   Date for PT Re-Evaluation 05/21/17   PT Start Time 0731   PT Stop Time 0825   PT Time Calculation (min) 54 min   Activity Tolerance Patient tolerated treatment well   Behavior During Therapy [facility] for tasks assessed/performed      Past Medical History:  Diagnosis Date  . Anxiety   . Arteritis, unspecified ([place])   . Arthritis    hands, knees, feet  . Diaphragmatic hernia without mention of obstruction or gangrene   . Dysrhythmia    afib, ablation- 12/2015, no problems since, off blood thinner- 09/2016  . Essential hypertension, benign   . Headache    had MRI- was consulted with Dr. Jannifer Franklin, but the consult, headache has resolved.  . Hyperplasia of prostate   . Iron deficiency anemia, unspecified   . Malaise and fatigue   . Obesity   . Other and unspecified hyperlipidemia   . Paroxysmal atrial fibrillation (HCC)   . Unspecified hypothyroidism     Past Surgical History:  Procedure Laterality Date  . ELECTROPHYSIOLOGIC STUDY N/A 12/24/2015   Afib ablation by Dr Rayann Heman  . KNEE SURGERY Right   . TOTAL KNEE ARTHROPLASTY Right 03/02/2017   Procedure: RIGHT TOTAL KNEE ARTHROPLASTY;  Surgeon: [name], MD;  Location: [facility];  Service: Orthopedics;  Laterality: Right;    There were no vitals filed for this visit.      Subjective Assessment - 04/23/17 0736    Subjective Reports continued stiffness but pain has improved since getting off pain medication. Reports that he has really worked his knee already this morning and walked a half mile this morning.   Limitations Walking   How long  can you sit comfortably? 2 hours   How long can you stand comfortably? 15 minutes   How long can you walk comfortably? 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 Yes   Pain Score 3    Pain Location Knee   Pain Orientation Right   Pain Descriptors / Indicators Other (Comment)  Stiffness   Pain Type Surgical pain   Pain Onset More than a month ago            [facility] PT Assessment - 04/23/17 0001      Assessment   Medical Diagnosis Right TKA   Onset Date/Surgical Date 03/02/17   Hand Dominance Right   Next MD Visit 05/16/2017   Prior Therapy yes, 5 HHPT visits following surgery     Precautions   Precautions None     Restrictions   Weight Bearing Restrictions No                     [facility] Adult PT Treatment/Exercise - 04/23/17 0001      Knee/Hip Exercises: Aerobic   Nustep L5 x15 min, seat adjusted for ROM     Knee/Hip Exercises: Machines for Strengthening   Cybex Knee Extension 20# 3x10 reps   Cybex Knee Flexion 40# 3x10 reps   Cybex  Leg Press 2 pl, seat 7, 3x10 reps     Knee/Hip Exercises: Standing   Forward Lunges Right;2 sets;10 reps;3 seconds   Rocker Board 2 minutes     Knee/Hip Exercises: Prone   Prone Knee Hang 3 minutes   Prone Knee Hang Weights (lbs) 3  with LLE overpressure as well     Modalities   Modalities Passenger transport manager Location R knee   Electrical Stimulation Action IFC   Electrical Stimulation Parameters 1-10 hz x15 min   Electrical Stimulation Goals Pain;Edema     Vasopneumatic   Number Minutes Vasopneumatic  15 minutes   Vasopnuematic Location  Knee   Vasopneumatic Pressure Medium   Vasopneumatic Temperature  71     Manual Therapy   Manual Therapy Passive ROM   Passive ROM PROM of R knee into extension with overpressure as well as passive HS stretch 3x30 sec                   PT Short Term Goals - 04/13/17 0810      PT SHORT TERM GOAL #1   Title pt will be independent with his initial HEP   Time 3   Period Weeks   Status Achieved     PT SHORT TERM GOAL #2   Title Pt will improve right knee flexion to >100 degrees actively in order to improve amb.   Time 3   Period Weeks   Status Achieved  AROM R knee flexion 107 deg 04/13/2017           PT Long Term Goals - 04/20/17 0815      PT LONG TERM GOAL #1   Title Pt will improve his FOTO score from 61% limitation to </= 40% limitation.   Time 8   Period Weeks   Status Achieved  FOTO score 32% current limitation 04/13/2017     PT LONG TERM GOAL #2   Title Pt will be able to amb with no assistive device >/= 1000 feet on community surfaces.   Time 8   Period Weeks   Status Achieved  No AD used per patient report 04/20/2017     PT LONG TERM GOAL #3   Title Pt will improve his R LE knee flexion and extension to 5/5 in order to improve function.   Time 8   Period Weeks   Status On-going     PT LONG TERM GOAL #4   Title Pt will improve R knee flexion to >/= 125 degrees in order to improve functional mobility.    Time 8   Period Weeks   Status On-going  AROM R knee flexion 110 deg 04/20/2017     PT LONG TERM GOAL #5   Title Pt will improve R knee extension to </= 5 degrees of flexion in order to improve gait.    Time 8   Period Weeks   Status On-going  -9 deg extension R knee 04/13/2017     PT LONG TERM GOAL #6   Title Pt will be albe to improve R knee pain </= 3/10 with amb and ADL's.    Time 8   Period Weeks   Status On-going               Plan - 04/23/17 0813    Clinical Impression Statement Patient tolerated today's treatment well although he is still frustrated with lack of R knee ROM. Patient had no  complaints during any part of the treatment or exercises today. Prone hang conducted with resistance without complaint. Normal modalities response noted  following removal of the modalities.   Rehab Potential Excellent   PT Frequency 3x / week   PT Duration 8 weeks   PT Treatment/Interventions ADLs/Self Care Home Management;Gait training;Patient/family education;Taping;Vasopneumatic Device;Cryotherapy;Electrical Stimulation;Moist Heat;Ultrasound;Therapeutic exercise;Therapeutic activities;Functional mobility training;Stair training;Manual techniques;Passive range of motion   PT Next Visit Plan Continue with R knee ROM focus with modalities and PRN per MPT POC.   PT Home Exercise Plan Quad sets, hamstring stretching, (HEP provided by HHPT with standing hip exercises)   Consulted and Agree with Plan of Care Patient      Patient will benefit from skilled therapeutic intervention in order to improve the following deficits and impairments:  Abnormal gait, Pain, Increased edema, Decreased activity tolerance, Decreased mobility, Difficulty walking, Decreased strength, Decreased range of motion  Visit Diagnosis: Stiffness of right knee, not elsewhere classified  Acute pain of right knee  Localized edema     Problem List Patient Active Problem List   Diagnosis Date Noted  . Status post total knee replacement, right 03/02/2017  . Chronic intractable headache 08/09/2016  . Other specified hypothyroidism 03/02/2016  . A-fib ([place]) 12/24/2015  . Non-ischemic cardiomyopathy ([place]) 11/07/2015  . Long term (current) use of anticoagulants 11/07/2015  . Diaphoresis 11/01/2015  . Chest pain 11/01/2015  . PAF (paroxysmal atrial fibrillation) ([place]) 07/15/2015  . Heart palpitations 07/11/2015  . Dyspnea 02/18/2015  . Anemia, iron deficiency 09/10/2013  . Diaphragmatic hernia without mention of obstruction or gangrene   . Hypothyroidism   . Malaise and fatigue   . Arteritis, unspecified ([place])   . Iron deficiency anemia, unspecified   . Obesity   . BPH (benign prostatic hyperplasia)   . Essential hypertension, benign   . Hyperlipidemia      Wynelle Fanny, PTA 04/23/2017, 8:50 AM  [facility] [address] [place], [place], [address] Phone: [phone]   Fax:  [phone]  Name: Troy Acosta MRN: [id number] Date of Birth: 04-21-1951

## 2017-04-25 ENCOUNTER — Ambulatory Visit: Payer: BLUE CROSS/BLUE SHIELD | Admitting: Physical Therapy

## 2017-04-25 DIAGNOSIS — M25561 Pain in right knee: Secondary | ICD-10-CM

## 2017-04-25 DIAGNOSIS — M25661 Stiffness of right knee, not elsewhere classified: Secondary | ICD-10-CM

## 2017-04-25 DIAGNOSIS — R600 Localized edema: Secondary | ICD-10-CM

## 2017-04-25 NOTE — Therapy (Signed)
[facility] [place], [place], [address] Phone: [phone]   Fax:  [phone]  Physical Therapy Treatment  Patient Details  Name: Troy Acosta MRN: [id number] Date of Birth: [birth year] Referring Provider: Esmond Plants, MD  Encounter Date: 04/25/2017      PT End of Session - 04/25/17 0802    Visit Number 15   Number of Visits 20   Date for PT Re-Evaluation 05/21/17   PT Start Time 0727   PT Stop Time 0818   PT Time Calculation (min) 51 min   Activity Tolerance Patient tolerated treatment well   Behavior During Therapy [facility] for tasks assessed/performed      Past Medical History:  Diagnosis Date  . Anxiety   . Arteritis, unspecified ([place])   . Arthritis    hands, knees, feet  . Diaphragmatic hernia without mention of obstruction or gangrene   . Dysrhythmia    afib, ablation- 12/2015, no problems since, off blood thinner- 09/2016  . Essential hypertension, benign   . Headache    had MRI- was consulted with Dr. Jannifer Franklin, but the consult, headache has resolved.  . Hyperplasia of prostate   . Iron deficiency anemia, unspecified   . Malaise and fatigue   . Obesity   . Other and unspecified hyperlipidemia   . Paroxysmal atrial fibrillation (HCC)   . Unspecified hypothyroidism     Past Surgical History:  Procedure Laterality Date  . ELECTROPHYSIOLOGIC STUDY N/A 12/24/2015   Afib ablation by Dr Rayann Heman  . KNEE SURGERY Right   . TOTAL KNEE ARTHROPLASTY Right 03/02/2017   Procedure: RIGHT TOTAL KNEE ARTHROPLASTY;  Surgeon: [name], MD;  Location: [place];  Service: Orthopedics;  Laterality: Right;    There were no vitals filed for this visit.      Subjective Assessment - 04/25/17 0739    Subjective I already walked 3/4 mile.   Pain Score 3    Pain Location Knee   Pain Orientation Right   Pain Descriptors / Indicators --  "Stiffness."   Pain Onset More than a month ago     Treatment:  Nustep level 5 x 15  minutes f/b PROM in supine to right knee into flexion and extension x 8 minutes f/b IFC and medium and vasopneumatic x 15 minutes.                              PT Short Term Goals - 04/13/17 0810      PT SHORT TERM GOAL #1   Title pt will be independent with his initial HEP   Time 3   Period Weeks   Status Achieved     PT SHORT TERM GOAL #2   Title Pt will improve right knee flexion to >100 degrees actively in order to improve amb.   Time 3   Period Weeks   Status Achieved  AROM R knee flexion 107 deg 04/13/2017           PT Long Term Goals - 04/20/17 0815      PT LONG TERM GOAL #1   Title Pt will improve his FOTO score from 61% limitation to </= 40% limitation.   Time 8   Period Weeks   Status Achieved  FOTO score 32% current limitation 04/13/2017     PT LONG TERM GOAL #2   Title Pt will be able to amb with no assistive device >/= 1000 feet on community  surfaces.   Time 8   Period Weeks   Status Achieved  No AD used per patient report 04/20/2017     PT LONG TERM GOAL #3   Title Pt will improve his R LE knee flexion and extension to 5/5 in order to improve function.   Time 8   Period Weeks   Status On-going     PT LONG TERM GOAL #4   Title Pt will improve R knee flexion to >/= 125 degrees in order to improve functional mobility.    Time 8   Period Weeks   Status On-going  AROM R knee flexion 110 deg 04/20/2017     PT LONG TERM GOAL #5   Title Pt will improve R knee extension to </= 5 degrees of flexion in order to improve gait.    Time 8   Period Weeks   Status On-going  -9 deg extension R knee 04/13/2017     PT LONG TERM GOAL #6   Title Pt will be albe to improve R knee pain </= 3/10 with amb and ADL's.    Time 8   Period Weeks   Status On-going             Patient will benefit from skilled therapeutic intervention in order to improve the following deficits and impairments:     Visit Diagnosis: Stiffness of right knee, not  elsewhere classified  Acute pain of right knee  Localized edema     Problem List Patient Active Problem List   Diagnosis Date Noted  . Status post total knee replacement, right 03/02/2017  . Chronic intractable headache 08/09/2016  . Other specified hypothyroidism 03/02/2016  . A-fib ([place]) 12/24/2015  . Non-ischemic cardiomyopathy ([place]) 11/07/2015  . Long term (current) use of anticoagulants 11/07/2015  . Diaphoresis 11/01/2015  . Chest pain 11/01/2015  . PAF (paroxysmal atrial fibrillation) ([place]) 07/15/2015  . Heart palpitations 07/11/2015  . Dyspnea 02/18/2015  . Anemia, iron deficiency 09/10/2013  . Diaphragmatic hernia without mention of obstruction or gangrene   . Hypothyroidism   . Malaise and fatigue   . Arteritis, unspecified ([place])   . Iron deficiency anemia, unspecified   . Obesity   . BPH (benign prostatic hyperplasia)   . Essential hypertension, benign   . Hyperlipidemia     Tenna Lacko, [place] MPT 04/25/2017, 8:19 AM  [facility] [address] [place], [place], [address] Phone: [phone]   Fax:  [phone]  Name: Troy Acosta MRN: [id number] Date of Birth: 06-11-1951

## 2017-04-27 ENCOUNTER — Encounter: Payer: Self-pay | Admitting: Physical Therapy

## 2017-04-27 ENCOUNTER — Ambulatory Visit: Payer: BLUE CROSS/BLUE SHIELD | Admitting: Physical Therapy

## 2017-04-27 DIAGNOSIS — M25561 Pain in right knee: Secondary | ICD-10-CM | POA: Diagnosis not present

## 2017-04-27 DIAGNOSIS — R600 Localized edema: Secondary | ICD-10-CM | POA: Diagnosis not present

## 2017-04-27 DIAGNOSIS — M25661 Stiffness of right knee, not elsewhere classified: Secondary | ICD-10-CM

## 2017-04-27 NOTE — Therapy (Signed)
[facility] [place], [place], [address] Phone: [phone]   Fax:  [phone]  Physical Therapy Treatment  Patient Details  Name: Troy Acosta MRN: [id number] Date of Birth: Nov 03, 1950 Referring Provider: Esmond Plants, MD  Encounter Date: 04/27/2017      PT End of Session - 04/27/17 0750    Visit Number 16   Number of Visits 20   Date for PT Re-Evaluation 05/21/17   PT Start Time 0732   PT Stop Time 0828   PT Time Calculation (min) 56 min   Activity Tolerance Patient tolerated treatment well   Behavior During Therapy [facility] for tasks assessed/performed      Past Medical History:  Diagnosis Date  . Anxiety   . Arteritis, unspecified ([place])   . Arthritis    hands, knees, feet  . Diaphragmatic hernia without mention of obstruction or gangrene   . Dysrhythmia    afib, ablation- 12/2015, no problems since, off blood thinner- 09/2016  . Essential hypertension, benign   . Headache    had MRI- was consulted with Dr. Jannifer Franklin, but the consult, headache has resolved.  . Hyperplasia of prostate   . Iron deficiency anemia, unspecified   . Malaise and fatigue   . Obesity   . Other and unspecified hyperlipidemia   . Paroxysmal atrial fibrillation (HCC)   . Unspecified hypothyroidism     Past Surgical History:  Procedure Laterality Date  . ELECTROPHYSIOLOGIC STUDY N/A 12/24/2015   Afib ablation by Dr Rayann Heman  . KNEE SURGERY Right   . TOTAL KNEE ARTHROPLASTY Right 03/02/2017   Procedure: RIGHT TOTAL KNEE ARTHROPLASTY;  Surgeon: [name], MD;  Location: [place];  Service: Orthopedics;  Laterality: Right;    There were no vitals filed for this visit.      Subjective Assessment - 04/27/17 0746    Subjective Reports "it ain't too bad today." Reports that he needs to straighten his knee.   Limitations Walking   How long can you sit comfortably? 2 hours   How long can you stand comfortably? 15 minutes   How long can you walk  comfortably? 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 Yes   Pain Score 2    Pain Location Knee   Pain Orientation Right   Pain Descriptors / Indicators Discomfort   Pain Type Surgical pain   Pain Onset More than a month ago            [facility] PT Assessment - 04/27/17 0001      Assessment   Medical Diagnosis Right TKA   Onset Date/Surgical Date 03/02/17   Hand Dominance Right   Next MD Visit 05/16/2017   Prior Therapy yes, 5 HHPT visits following surgery     Precautions   Precautions None     Restrictions   Weight Bearing Restrictions No     ROM / Strength   AROM / PROM / Strength AROM     AROM   Overall AROM  Deficits   AROM Assessment Site Knee   Right/Left Knee Right   Right Knee Extension -6   Right Knee Flexion 114                     [facility] Adult PT Treatment/Exercise - 04/27/17 0001      Knee/Hip Exercises: Aerobic   Nustep L5 x15 min, seat adjusted  for ROM     Knee/Hip Exercises: Machines for Strengthening   Cybex Knee Extension 20# 3x10 reps   Cybex Knee Flexion 50# 3x10 reps   Cybex Leg Press 3 pl, seat 7, 3x10 reps     Knee/Hip Exercises: Standing   Forward Lunges Right;2 sets;10 reps;3 seconds   Terminal Knee Extension Limitations Pink XTS RLE x30 reps 5 sec hold   Wall Squat 1 set;15 reps     Modalities   Modalities Electrical Stimulation;Vasopneumatic     Electrical Stimulation   Electrical Stimulation Location R knee   Electrical Stimulation Action IFC   Electrical Stimulation Parameters 1-10 hz x15 min   Electrical Stimulation Goals Pain;Edema     Vasopneumatic   Number Minutes Vasopneumatic  15 minutes   Vasopnuematic Location  Knee   Vasopneumatic Pressure Medium   Vasopneumatic Temperature  34     Manual Therapy   Manual Therapy Passive ROM   Passive ROM PROM of R knee into flexion and extension with holds at end range                   PT Short Term Goals - 04/13/17 0810      PT SHORT TERM GOAL #1   Title pt will be independent with his initial HEP   Time 3   Period Weeks   Status Achieved     PT SHORT TERM GOAL #2   Title Pt will improve right knee flexion to >100 degrees actively in order to improve amb.   Time 3   Period Weeks   Status Achieved  AROM R knee flexion 107 deg 04/13/2017           PT Long Term Goals - 04/20/17 0815      PT LONG TERM GOAL #1   Title Pt will improve his FOTO score from 61% limitation to </= 40% limitation.   Time 8   Period Weeks   Status Achieved  FOTO score 32% current limitation 04/13/2017     PT LONG TERM GOAL #2   Title Pt will be able to amb with no assistive device >/= 1000 feet on community surfaces.   Time 8   Period Weeks   Status Achieved  No AD used per patient report 04/20/2017     PT LONG TERM GOAL #3   Title Pt will improve his R LE knee flexion and extension to 5/5 in order to improve function.   Time 8   Period Weeks   Status On-going     PT LONG TERM GOAL #4   Title Pt will improve R knee flexion to >/= 125 degrees in order to improve functional mobility.    Time 8   Period Weeks   Status On-going  AROM R knee flexion 110 deg 04/20/2017     PT LONG TERM GOAL #5   Title Pt will improve R knee extension to </= 5 degrees of flexion in order to improve gait.    Time 8   Period Weeks   Status On-going  -9 deg extension R knee 04/13/2017     PT LONG TERM GOAL #6   Title Pt will be albe to improve R knee pain </= 3/10 with amb and ADL's.    Time 8   Period Weeks   Status On-going               Plan - 04/27/17 6606    Clinical Impression Statement Patient tolerated today's treatment well  with main focus on improving R knee ROM. Patient tolerated all exercises well with only fatigue reported with wall slides today. AROM improved today following manual therapy today to 6-114 deg. Normal modalities response noted  following removal of the modalities.   Rehab Potential Excellent   PT Frequency 3x / week   PT Duration 8 weeks   PT Treatment/Interventions ADLs/Self Care Home Management;Gait training;Patient/family education;Taping;Vasopneumatic Device;Cryotherapy;Electrical Stimulation;Moist Heat;Ultrasound;Therapeutic exercise;Therapeutic activities;Functional mobility training;Stair training;Manual techniques;Passive range of motion   PT Next Visit Plan Continue with R knee ROM focus with modalities and PRN per MPT POC.   PT Home Exercise Plan Quad sets, hamstring stretching, (HEP provided by HHPT with standing hip exercises)   Consulted and Agree with Plan of Care Patient      Patient will benefit from skilled therapeutic intervention in order to improve the following deficits and impairments:  Abnormal gait, Pain, Increased edema, Decreased activity tolerance, Decreased mobility, Difficulty walking, Decreased strength, Decreased range of motion  Visit Diagnosis: Stiffness of right knee, not elsewhere classified  Acute pain of right knee  Localized edema     Problem List Patient Active Problem List   Diagnosis Date Noted  . Status post total knee replacement, right 03/02/2017  . Chronic intractable headache 08/09/2016  . Other specified hypothyroidism 03/02/2016  . A-fib ([place]) 12/24/2015  . Non-ischemic cardiomyopathy ([place]) 11/07/2015  . Long term (current) use of anticoagulants 11/07/2015  . Diaphoresis 11/01/2015  . Chest pain 11/01/2015  . PAF (paroxysmal atrial fibrillation) ([place]) 07/15/2015  . Heart palpitations 07/11/2015  . Dyspnea 02/18/2015  . Anemia, iron deficiency 09/10/2013  . Diaphragmatic hernia without mention of obstruction or gangrene   . Hypothyroidism   . Malaise and fatigue   . Arteritis, unspecified ([place])   . Iron deficiency anemia, unspecified   . Obesity   . BPH (benign prostatic hyperplasia)   . Essential hypertension, benign   . Hyperlipidemia      Wynelle Fanny, PTA 04/27/2017, 8:31 AM  [facility] [address] [place], [place], [address] Phone: [phone]   Fax:  [phone]  Name: Troy Acosta MRN: [id number] Date of Birth: [birth year]

## 2017-04-30 ENCOUNTER — Encounter: Payer: Self-pay | Admitting: Physical Therapy

## 2017-04-30 ENCOUNTER — Ambulatory Visit: Payer: BLUE CROSS/BLUE SHIELD | Admitting: Physical Therapy

## 2017-04-30 DIAGNOSIS — M25661 Stiffness of right knee, not elsewhere classified: Secondary | ICD-10-CM | POA: Diagnosis not present

## 2017-04-30 DIAGNOSIS — R600 Localized edema: Secondary | ICD-10-CM

## 2017-04-30 DIAGNOSIS — M25561 Pain in right knee: Secondary | ICD-10-CM

## 2017-04-30 NOTE — Therapy (Signed)
[facility] [place], [place], [address] Phone: [phone]   Fax:  [phone]  Physical Therapy Treatment  Patient Details  Name: Troy Acosta MRN: [id number] Date of Birth: 12-15-1950 Referring Provider: Esmond Plants, MD  Encounter Date: 04/30/2017      PT End of Session - 04/30/17 1002    Visit Number 17   Number of Visits 20   Date for PT Re-Evaluation 05/21/17   PT Start Time 0947   PT Stop Time 1051   PT Time Calculation (min) 64 min   Activity Tolerance Patient tolerated treatment well   Behavior During Therapy [facility] for tasks assessed/performed      Past Medical History:  Diagnosis Date  . Anxiety   . Arteritis, unspecified ([place])   . Arthritis    hands, knees, feet  . Diaphragmatic hernia without mention of obstruction or gangrene   . Dysrhythmia    afib, ablation- 12/2015, no problems since, off blood thinner- 09/2016  . Essential hypertension, benign   . Headache    had MRI- was consulted with Dr. Jannifer Franklin, but the consult, headache has resolved.  . Hyperplasia of prostate   . Iron deficiency anemia, unspecified   . Malaise and fatigue   . Obesity   . Other and unspecified hyperlipidemia   . Paroxysmal atrial fibrillation (HCC)   . Unspecified hypothyroidism     Past Surgical History:  Procedure Laterality Date  . ELECTROPHYSIOLOGIC STUDY N/A 12/24/2015   Afib ablation by Dr Rayann Heman  . KNEE SURGERY Right   . TOTAL KNEE ARTHROPLASTY Right 03/02/2017   Procedure: RIGHT TOTAL KNEE ARTHROPLASTY;  Surgeon: [name], MD;  Location: [place];  Service: Orthopedics;  Laterality: Right;    There were no vitals filed for this visit.      Subjective Assessment - 04/30/17 1001    Subjective Reports that he has really worked his knee this morning with walking half mile, sitting on tailgate and swinging his legs, stationary bike and prone hangs.   Limitations Walking   How long can you sit comfortably? 2  hours   How long can you stand comfortably? 15 minutes   How long can you walk comfortably? 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 Yes   Pain Score 3    Pain Location Knee   Pain Orientation Right   Pain Descriptors / Indicators Discomfort   Pain Type Surgical pain   Pain Onset More than a month ago            [facility] PT Assessment - 04/30/17 0001      Assessment   Medical Diagnosis Right TKA   Onset Date/Surgical Date 03/02/17   Hand Dominance Right   Next MD Visit 05/16/2017   Prior Therapy yes, 5 HHPT visits following surgery     Precautions   Precautions None     Restrictions   Weight Bearing Restrictions No     ROM / Strength   AROM / PROM / Strength AROM     AROM   Overall AROM  Deficits   AROM Assessment Site Knee   Right/Left Knee Right   Right Knee Extension -8   Right Knee Flexion 115                     [facility] Adult PT Treatment/Exercise - 04/30/17 0001  Knee/Hip Exercises: Stretches   Passive Hamstring Stretch Right;3 reps;30 seconds     Knee/Hip Exercises: Aerobic   Nustep L4 x15 min     Knee/Hip Exercises: Machines for Strengthening   Cybex Knee Extension 20# 3x10 reps   Cybex Knee Flexion 50# 3x10 reps   Cybex Leg Press 2 pl, seat 7, 3x10 reps     Knee/Hip Exercises: Standing   Forward Lunges Right;3 sets;10 reps   Terminal Knee Extension Limitations Pink XTS RLE x20 reps 5 sec hold     Modalities   Modalities Electrical Stimulation;Vasopneumatic     Electrical Stimulation   Electrical Stimulation Location R knee   Electrical Stimulation Action IFC   Electrical Stimulation Parameters 1-10 hz x15 min   Electrical Stimulation Goals Pain;Edema     Vasopneumatic   Number Minutes Vasopneumatic  15 minutes   Vasopnuematic Location  Knee   Vasopneumatic Pressure Medium   Vasopneumatic Temperature  34     Manual Therapy   Manual Therapy  Passive ROM   Passive ROM PROM of R knee into flexion and extension with holds at end range                  PT Short Term Goals - 04/13/17 0810      PT SHORT TERM GOAL #1   Title pt will be independent with his initial HEP   Time 3   Period Weeks   Status Achieved     PT SHORT TERM GOAL #2   Title Pt will improve right knee flexion to >100 degrees actively in order to improve amb.   Time 3   Period Weeks   Status Achieved  AROM R knee flexion 107 deg 04/13/2017           PT Long Term Goals - 04/20/17 0815      PT LONG TERM GOAL #1   Title Pt will improve his FOTO score from 61% limitation to </= 40% limitation.   Time 8   Period Weeks   Status Achieved  FOTO score 32% current limitation 04/13/2017     PT LONG TERM GOAL #2   Title Pt will be able to amb with no assistive device >/= 1000 feet on community surfaces.   Time 8   Period Weeks   Status Achieved  No AD used per patient report 04/20/2017     PT LONG TERM GOAL #3   Title Pt will improve his R LE knee flexion and extension to 5/5 in order to improve function.   Time 8   Period Weeks   Status On-going     PT LONG TERM GOAL #4   Title Pt will improve R knee flexion to >/= 125 degrees in order to improve functional mobility.    Time 8   Period Weeks   Status On-going  AROM R knee flexion 110 deg 04/20/2017     PT LONG TERM GOAL #5   Title Pt will improve R knee extension to </= 5 degrees of flexion in order to improve gait.    Time 8   Period Weeks   Status On-going  -9 deg extension R knee 04/13/2017     PT LONG TERM GOAL #6   Title Pt will be albe to improve R knee pain </= 3/10 with amb and ADL's.    Time 8   Period Weeks   Status On-going               Plan -  04/30/17 1041    Clinical Impression Statement Patient tolerated today's treatment well with no complaints of any exercises with focus on ROM and strengthening of Quad to improve ROM. No complaints of increased pain with any  exercises although R knee flexion noted with stance. AROM of R knee measured as 8-115 deg. Normal modalities response noted following removal of the modalities.   Rehab Potential Excellent   PT Frequency 3x / week   PT Duration 8 weeks   PT Treatment/Interventions ADLs/Self Care Home Management;Gait training;Patient/family education;Taping;Vasopneumatic Device;Cryotherapy;Electrical Stimulation;Moist Heat;Ultrasound;Therapeutic exercise;Therapeutic activities;Functional mobility training;Stair training;Manual techniques;Passive range of motion   PT Next Visit Plan Continue with R knee ROM focus with modalities and PRN per MPT POC.   PT Home Exercise Plan Quad sets, hamstring stretching, (HEP provided by HHPT with standing hip exercises)   Consulted and Agree with Plan of Care Patient      Patient will benefit from skilled therapeutic intervention in order to improve the following deficits and impairments:  Abnormal gait, Pain, Increased edema, Decreased activity tolerance, Decreased mobility, Difficulty walking, Decreased strength, Decreased range of motion  Visit Diagnosis: Stiffness of right knee, not elsewhere classified  Acute pain of right knee  Localized edema     Problem List Patient Active Problem List   Diagnosis Date Noted  . Status post total knee replacement, right 03/02/2017  . Chronic intractable headache 08/09/2016  . Other specified hypothyroidism 03/02/2016  . A-fib ([place]) 12/24/2015  . Non-ischemic cardiomyopathy ([place]) 11/07/2015  . Long term (current) use of anticoagulants 11/07/2015  . Diaphoresis 11/01/2015  . Chest pain 11/01/2015  . PAF (paroxysmal atrial fibrillation) ([place]) 07/15/2015  . Heart palpitations 07/11/2015  . Dyspnea 02/18/2015  . Anemia, iron deficiency 09/10/2013  . Diaphragmatic hernia without mention of obstruction or gangrene   . Hypothyroidism   . Malaise and fatigue   . Arteritis, unspecified ([place])   . Iron deficiency anemia,  unspecified   . Obesity   . BPH (benign prostatic hyperplasia)   . Essential hypertension, benign   . Hyperlipidemia     Wynelle Fanny, PTA 04/30/2017, 10:59 AM  [facility] [place], [place], [address] Phone: [phone]   Fax:  [phone]  Name: Troy Acosta MRN: [id number] Date of Birth: 11-May-1951

## 2017-05-02 ENCOUNTER — Encounter: Payer: Self-pay | Admitting: Physical Therapy

## 2017-05-02 ENCOUNTER — Ambulatory Visit: Payer: BLUE CROSS/BLUE SHIELD | Admitting: Physical Therapy

## 2017-05-02 DIAGNOSIS — M25561 Pain in right knee: Secondary | ICD-10-CM

## 2017-05-02 DIAGNOSIS — M25661 Stiffness of right knee, not elsewhere classified: Secondary | ICD-10-CM

## 2017-05-02 DIAGNOSIS — R600 Localized edema: Secondary | ICD-10-CM | POA: Diagnosis not present

## 2017-05-02 NOTE — Therapy (Signed)
[facility] [place], [place], [address] Phone: [phone]   Fax:  [phone]  Physical Therapy Treatment  Patient Details  Name: Troy Acosta MRN: [id number] Date of Birth: 1951/07/26 Referring Provider: Esmond Plants, MD  Encounter Date: 05/02/2017      PT End of Session - 05/02/17 0736    Visit Number 18   Number of Visits 20   Date for PT Re-Evaluation 05/21/17   PT Start Time 0731   PT Stop Time 0825   PT Time Calculation (min) 54 min   Activity Tolerance Patient tolerated treatment well   Behavior During Therapy [facility] for tasks assessed/performed      Past Medical History:  Diagnosis Date  . Anxiety   . Arteritis, unspecified ([place])   . Arthritis    hands, knees, feet  . Diaphragmatic hernia without mention of obstruction or gangrene   . Dysrhythmia    afib, ablation- 12/2015, no problems since, off blood thinner- 09/2016  . Essential hypertension, benign   . Headache    had MRI- was consulted with Dr. Jannifer Franklin, but the consult, headache has resolved.  . Hyperplasia of prostate   . Iron deficiency anemia, unspecified   . Malaise and fatigue   . Obesity   . Other and unspecified hyperlipidemia   . Paroxysmal atrial fibrillation (HCC)   . Unspecified hypothyroidism     Past Surgical History:  Procedure Laterality Date  . ELECTROPHYSIOLOGIC STUDY N/A 12/24/2015   Afib ablation by Dr Rayann Heman  . KNEE SURGERY Right   . TOTAL KNEE ARTHROPLASTY Right 03/02/2017   Procedure: RIGHT TOTAL KNEE ARTHROPLASTY;  Surgeon: [name], MD;  Location: [place];  Service: Orthopedics;  Laterality: Right;    There were no vitals filed for this visit.      Subjective Assessment - 05/02/17 0736    Subjective Reports that he did not sleep well last night but has been on stationary bike for a while this morning.   Limitations Walking   How long can you sit comfortably? 2 hours   How long can you stand comfortably? 15 minutes    How long can you walk comfortably? 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 Yes   Pain Score 2    Pain Location Knee   Pain Orientation Right   Pain Descriptors / Indicators Discomfort   Pain Type Surgical pain   Pain Onset More than a month ago            [facility] PT Assessment - 05/02/17 0001      Assessment   Medical Diagnosis Right TKA   Onset Date/Surgical Date 03/02/17   Hand Dominance Right   Next MD Visit 05/16/2017   Prior Therapy yes, 5 HHPT visits following surgery     Precautions   Precautions None     Restrictions   Weight Bearing Restrictions No     ROM / Strength   AROM / PROM / Strength AROM     AROM   Overall AROM  Deficits   AROM Assessment Site Knee   Right/Left Knee Right   Right Knee Extension -7                     [facility] Adult PT Treatment/Exercise - 05/02/17 0001      Knee/Hip Exercises: Aerobic   Nustep L5 x15 min  Knee/Hip Exercises: Machines for Strengthening   Cybex Knee Extension 20# 3x10 reps   Cybex Knee Flexion 50# 3x10 reps   Cybex Leg Press 3 plates, seat 6, 3x10 reps     Knee/Hip Exercises: Standing   Terminal Knee Extension Limitations Pink XTS RLE x0 reps 5 sec hold     Knee/Hip Exercises: Prone   Prone Knee Hang 4 minutes   Prone Knee Hang Weights (lbs) 4     Modalities   Modalities Passenger transport manager Location R knee   Electrical Stimulation Action IFC   Electrical Stimulation Parameters 1-10 hz x15 min   Electrical Stimulation Goals Pain;Edema     Vasopneumatic   Number Minutes Vasopneumatic  15 minutes   Vasopnuematic Location  Knee   Vasopneumatic Pressure Medium   Vasopneumatic Temperature  68     Manual Therapy   Manual Therapy Passive ROM   Passive ROM PROM of R knee into extension to promote increased ROM                   PT Short Term Goals - 04/13/17 0810      PT SHORT TERM GOAL #1   Title pt will be independent with his initial HEP   Time 3   Period Weeks   Status Achieved     PT SHORT TERM GOAL #2   Title Pt will improve right knee flexion to >100 degrees actively in order to improve amb.   Time 3   Period Weeks   Status Achieved  AROM R knee flexion 107 deg 04/13/2017           PT Long Term Goals - 04/20/17 0815      PT LONG TERM GOAL #1   Title Pt will improve his FOTO score from 61% limitation to </= 40% limitation.   Time 8   Period Weeks   Status Achieved  FOTO score 32% current limitation 04/13/2017     PT LONG TERM GOAL #2   Title Pt will be able to amb with no assistive device >/= 1000 feet on community surfaces.   Time 8   Period Weeks   Status Achieved  No AD used per patient report 04/20/2017     PT LONG TERM GOAL #3   Title Pt will improve his R LE knee flexion and extension to 5/5 in order to improve function.   Time 8   Period Weeks   Status On-going     PT LONG TERM GOAL #4   Title Pt will improve R knee flexion to >/= 125 degrees in order to improve functional mobility.    Time 8   Period Weeks   Status On-going  AROM R knee flexion 110 deg 04/20/2017     PT LONG TERM GOAL #5   Title Pt will improve R knee extension to </= 5 degrees of flexion in order to improve gait.    Time 8   Period Weeks   Status On-going  -9 deg extension R knee 04/13/2017     PT LONG TERM GOAL #6   Title Pt will be albe to improve R knee pain </= 3/10 with amb and ADL's.    Time 8   Period Weeks   Status On-going               Plan - 05/02/17 0813    Clinical Impression Statement Patient continues to tolerate treatment well with  only low level R Knee pain. Patient able to progress as exercises modified to increase strength/ROM. No complaints during any exercise today in clinic. Low table height required to allow extra pressure with PROM into extension for R knee. Measured  lack of 7 deg of extension following PROM of R knee into extension. Normal modalities response noted following removal of the modalities.   Rehab Potential Excellent   PT Frequency 3x / week   PT Duration 8 weeks   PT Treatment/Interventions ADLs/Self Care Home Management;Gait training;Patient/family education;Taping;Vasopneumatic Device;Cryotherapy;Electrical Stimulation;Moist Heat;Ultrasound;Therapeutic exercise;Therapeutic activities;Functional mobility training;Stair training;Manual techniques;Passive range of motion   PT Next Visit Plan Continue with R knee ROM focus with modalities and PRN per MPT POC.   PT Home Exercise Plan Quad sets, hamstring stretching, (HEP provided by HHPT with standing hip exercises)   Consulted and Agree with Plan of Care Patient      Patient will benefit from skilled therapeutic intervention in order to improve the following deficits and impairments:  Abnormal gait, Pain, Increased edema, Decreased activity tolerance, Decreased mobility, Difficulty walking, Decreased strength, Decreased range of motion  Visit Diagnosis: Stiffness of right knee, not elsewhere classified  Acute pain of right knee  Localized edema     Problem List Patient Active Problem List   Diagnosis Date Noted  . Status post total knee replacement, right 03/02/2017  . Chronic intractable headache 08/09/2016  . Other specified hypothyroidism 03/02/2016  . A-fib ([place]) 12/24/2015  . Non-ischemic cardiomyopathy ([place]) 11/07/2015  . Long term (current) use of anticoagulants 11/07/2015  . Diaphoresis 11/01/2015  . Chest pain 11/01/2015  . PAF (paroxysmal atrial fibrillation) ([place]) 07/15/2015  . Heart palpitations 07/11/2015  . Dyspnea 02/18/2015  . Anemia, iron deficiency 09/10/2013  . Diaphragmatic hernia without mention of obstruction or gangrene   . Hypothyroidism   . Malaise and fatigue   . Arteritis, unspecified ([place])   . Iron deficiency anemia, unspecified   . Obesity   .  BPH (benign prostatic hyperplasia)   . Essential hypertension, benign   . Hyperlipidemia     Wynelle Fanny, PTA 05/02/2017, 8:34 AM  [facility] [address] [place], [place], [address] Phone: [phone]   Fax:  [phone]  Name: Troy Acosta MRN: [id number] Date of Birth: 05/30/1951

## 2017-05-04 ENCOUNTER — Encounter: Payer: Self-pay | Admitting: Physical Therapy

## 2017-05-04 ENCOUNTER — Ambulatory Visit: Payer: BLUE CROSS/BLUE SHIELD | Admitting: Physical Therapy

## 2017-05-04 DIAGNOSIS — R600 Localized edema: Secondary | ICD-10-CM | POA: Diagnosis not present

## 2017-05-04 DIAGNOSIS — M25561 Pain in right knee: Secondary | ICD-10-CM | POA: Diagnosis not present

## 2017-05-04 DIAGNOSIS — M25661 Stiffness of right knee, not elsewhere classified: Secondary | ICD-10-CM

## 2017-05-04 NOTE — Therapy (Signed)
[facility] [place], [place], [address] Phone: [phone]   Fax:  [phone]  Physical Therapy Treatment  Patient Details  Name: Troy Acosta MRN: [id number] Date of Birth: 02/12/1951 Referring Provider: Esmond Plants, MD  Encounter Date: 05/04/2017      PT End of Session - 05/04/17 0745    Visit Number 19   Number of Visits 20   Date for PT Re-Evaluation 05/21/17   PT Start Time 0733   PT Stop Time 0821   PT Time Calculation (min) 48 min      Past Medical History:  Diagnosis Date  . Anxiety   . Arteritis, unspecified ([place])   . Arthritis    hands, knees, feet  . Diaphragmatic hernia without mention of obstruction or gangrene   . Dysrhythmia    afib, ablation- 12/2015, no problems since, off blood thinner- 09/2016  . Essential hypertension, benign   . Headache    had MRI- was consulted with Dr. Jannifer Franklin, but the consult, headache has resolved.  . Hyperplasia of prostate   . Iron deficiency anemia, unspecified   . Malaise and fatigue   . Obesity   . Other and unspecified hyperlipidemia   . Paroxysmal atrial fibrillation (HCC)   . Unspecified hypothyroidism     Past Surgical History:  Procedure Laterality Date  . ELECTROPHYSIOLOGIC STUDY N/A 12/24/2015   Afib ablation by Dr Rayann Heman  . KNEE SURGERY Right   . TOTAL KNEE ARTHROPLASTY Right 03/02/2017   Procedure: RIGHT TOTAL KNEE ARTHROPLASTY;  Surgeon: [name], MD;  Location: [place];  Service: Orthopedics;  Laterality: Right;    There were no vitals filed for this visit.      Subjective Assessment - 05/04/17 0743    Subjective Reports soreness and tightness and hurting this morning.   Limitations Walking   How long can you sit comfortably? 2 hours   How long can you stand comfortably? 15 minutes   How long can you walk comfortably? 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  Yes   Pain Score 3    Pain Location Knee   Pain Orientation Right   Pain Descriptors / Indicators Discomfort;Sore;Tightness   Pain Type Surgical pain   Pain Onset More than a month ago            [facility] PT Assessment - 05/04/17 0001      Assessment   Medical Diagnosis Right TKA   Onset Date/Surgical Date 03/02/17   Hand Dominance Right   Next MD Visit 05/16/2017   Prior Therapy yes, 5 HHPT visits following surgery     Precautions   Precautions None     Restrictions   Weight Bearing Restrictions No     AROM   Right Knee Extension -8                     [facility] Adult PT Treatment/Exercise - 05/04/17 0001      Knee/Hip Exercises: Aerobic   Nustep L5 x13 min     Knee/Hip Exercises: Machines for Strengthening   Cybex Knee Extension 20# 3x10 reps   Cybex Knee Flexion 50# 3x10 reps   Cybex Leg Press 3 plates, seat 6, 3x10 reps     Knee/Hip Exercises: Standing   Forward Lunges Right;3 sets;10 reps   Terminal Knee Extension Limitations Pink XTS RLE x30 reps 5 sec  hold     Modalities   Modalities Teacher, early years/pre Action IFC   Electrical Stimulation Parameters 1-10 hz x15 min   Electrical Stimulation Goals Pain;Edema     Vasopneumatic   Number Minutes Vasopneumatic  15 minutes   Vasopnuematic Location  Knee   Vasopneumatic Pressure Medium   Vasopneumatic Temperature  34     Manual Therapy   Manual Therapy Passive ROM   Passive ROM PROM of R knee into extension to promote increased ROM                  PT Short Term Goals - 04/13/17 0810      PT SHORT TERM GOAL #1   Title pt will be independent with his initial HEP   Time 3   Period Weeks   Status Achieved     PT SHORT TERM GOAL #2   Title Pt will improve right knee flexion to >100 degrees actively in order to improve amb.   Time 3   Period Weeks   Status Achieved  AROM  R knee flexion 107 deg 04/13/2017           PT Long Term Goals - 04/20/17 0815      PT LONG TERM GOAL #1   Title Pt will improve his FOTO score from 61% limitation to </= 40% limitation.   Time 8   Period Weeks   Status Achieved  FOTO score 32% current limitation 04/13/2017     PT LONG TERM GOAL #2   Title Pt will be able to amb with no assistive device >/= 1000 feet on community surfaces.   Time 8   Period Weeks   Status Achieved  No AD used per patient report 04/20/2017     PT LONG TERM GOAL #3   Title Pt will improve his R LE knee flexion and extension to 5/5 in order to improve function.   Time 8   Period Weeks   Status On-going     PT LONG TERM GOAL #4   Title Pt will improve R knee flexion to >/= 125 degrees in order to improve functional mobility.    Time 8   Period Weeks   Status On-going  AROM R knee flexion 110 deg 04/20/2017     PT LONG TERM GOAL #5   Title Pt will improve R knee extension to </= 5 degrees of flexion in order to improve gait.    Time 8   Period Weeks   Status On-going  -9 deg extension R knee 04/13/2017     PT LONG TERM GOAL #6   Title Pt will be albe to improve R knee pain </= 3/10 with amb and ADL's.    Time 8   Period Weeks   Status On-going               Plan - 05/04/17 0815    Clinical Impression Statement Patient continues to experience R knee tightness and soreness with lack of full R knee ROM. Patient able to complete all exercises as directed without complaint of pain. R Quad weakness still noted with TKE with XTS. AROM of R knee extension lacking 8 deg of full ROM. Normal modalities response noted following removal of the modalities. Patient continues to progress slowly in regards to pain, ROM and strength goals.   Rehab Potential Excellent   PT Frequency 3x /  week   PT Duration 8 weeks   PT Treatment/Interventions ADLs/Self Care Home Management;Gait training;Patient/family education;Taping;Vasopneumatic  Device;Cryotherapy;Electrical Stimulation;Moist Heat;Ultrasound;Therapeutic exercise;Therapeutic activities;Functional mobility training;Stair training;Manual techniques;Passive range of motion   PT Next Visit Plan Continue with R knee ROM focus with modalities and PRN per MPT POC.   PT Home Exercise Plan Quad sets, hamstring stretching, (HEP provided by HHPT with standing hip exercises)   Consulted and Agree with Plan of Care Patient      Patient will benefit from skilled therapeutic intervention in order to improve the following deficits and impairments:  Abnormal gait, Pain, Increased edema, Decreased activity tolerance, Decreased mobility, Difficulty walking, Decreased strength, Decreased range of motion  Visit Diagnosis: Stiffness of right knee, not elsewhere classified  Acute pain of right knee  Localized edema     Problem List Patient Active Problem List   Diagnosis Date Noted  . Status post total knee replacement, right 03/02/2017  . Chronic intractable headache 08/09/2016  . Other specified hypothyroidism 03/02/2016  . A-fib ([place]) 12/24/2015  . Non-ischemic cardiomyopathy ([place]) 11/07/2015  . Long term (current) use of anticoagulants 11/07/2015  . Diaphoresis 11/01/2015  . Chest pain 11/01/2015  . PAF (paroxysmal atrial fibrillation) ([place]) 07/15/2015  . Heart palpitations 07/11/2015  . Dyspnea 02/18/2015  . Anemia, iron deficiency 09/10/2013  . Diaphragmatic hernia without mention of obstruction or gangrene   . Hypothyroidism   . Malaise and fatigue   . Arteritis, unspecified ([place])   . Iron deficiency anemia, unspecified   . Obesity   . BPH (benign prostatic hyperplasia)   . Essential hypertension, benign   . Hyperlipidemia     Wynelle Fanny, PTA 05/04/2017, 8:34 AM  [facility] [address] [place], [place], [address] Phone: [phone]   Fax:  [phone]  Name: Troy Acosta MRN: [id number] Date of Birth:  [birth year]

## 2017-05-07 ENCOUNTER — Encounter: Payer: Self-pay | Admitting: Physical Therapy

## 2017-05-07 ENCOUNTER — Ambulatory Visit: Payer: BLUE CROSS/BLUE SHIELD | Admitting: Physical Therapy

## 2017-05-07 DIAGNOSIS — Z79899 Other long term (current) drug therapy: Secondary | ICD-10-CM | POA: Diagnosis not present

## 2017-05-07 DIAGNOSIS — R600 Localized edema: Secondary | ICD-10-CM | POA: Diagnosis not present

## 2017-05-07 DIAGNOSIS — E039 Hypothyroidism, unspecified: Secondary | ICD-10-CM | POA: Diagnosis not present

## 2017-05-07 DIAGNOSIS — M25561 Pain in right knee: Secondary | ICD-10-CM | POA: Diagnosis not present

## 2017-05-07 DIAGNOSIS — M25661 Stiffness of right knee, not elsewhere classified: Secondary | ICD-10-CM

## 2017-05-07 NOTE — Therapy (Signed)
[facility] [place], [place], [address] Phone: [phone]   Fax:  [phone]  Physical Therapy Treatment  Patient Details  Name: Troy Acosta MRN: [id number] Date of Birth: Jul 08, 1951 Referring Provider: Esmond Plants, MD  Encounter Date: 05/07/2017      PT End of Session - 05/07/17 0807    Visit Number 20   Number of Visits 20   Date for PT Re-Evaluation 05/21/17   PT Start Time 0731   PT Stop Time 0829   PT Time Calculation (min) 58 min   Activity Tolerance Patient tolerated treatment well   Behavior During Therapy [facility] for tasks assessed/performed      Past Medical History:  Diagnosis Date  . Anxiety   . Arteritis, unspecified ([place])   . Arthritis    hands, knees, feet  . Diaphragmatic hernia without mention of obstruction or gangrene   . Dysrhythmia    afib, ablation- 12/2015, no problems since, off blood thinner- 09/2016  . Essential hypertension, benign   . Headache    had MRI- was consulted with Dr. Jannifer Franklin, but the consult, headache has resolved.  . Hyperplasia of prostate   . Iron deficiency anemia, unspecified   . Malaise and fatigue   . Obesity   . Other and unspecified hyperlipidemia   . Paroxysmal atrial fibrillation (HCC)   . Unspecified hypothyroidism     Past Surgical History:  Procedure Laterality Date  . ELECTROPHYSIOLOGIC STUDY N/A 12/24/2015   Afib ablation by Dr Rayann Heman  . KNEE SURGERY Right   . TOTAL KNEE ARTHROPLASTY Right 03/02/2017   Procedure: RIGHT TOTAL KNEE ARTHROPLASTY;  Surgeon: [name], MD;  Location: [place];  Service: Orthopedics;  Laterality: Right;    There were no vitals filed for this visit.      Subjective Assessment - 05/07/17 0735    Subjective Patient reported ongoing soreness and tightness in knee   Limitations Walking   How long can you sit comfortably? 2 hours   How long can you stand comfortably? 15 minutes   How long can you walk comfortably? 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 Yes   Pain Score 3    Pain Location Knee   Pain Orientation Right   Pain Descriptors / Indicators Discomfort;Tightness   Pain Type Surgical pain   Pain Onset More than a month ago   Pain Frequency Constant   Aggravating Factors  more at night/ROM in knee   Pain Relieving Factors meds and at rest            [facility] PT Assessment - 05/07/17 0001      ROM / Strength   AROM / PROM / Strength AROM;PROM     AROM   AROM Assessment Site Knee   Right/Left Knee Right   Right Knee Extension -13   Right Knee Flexion 97     PROM   PROM Assessment Site Knee   Right/Left Knee Right   Right Knee Extension -8   Right Knee Flexion 107                     [facility] Adult PT Treatment/Exercise - 05/07/17 0001      Knee/Hip Exercises: Aerobic   Nustep L5 x15 min UE/LE     Knee/Hip Exercises: Machines for Strengthening   Cybex Knee Extension 20# 3x15 reps  Cybex Knee Flexion 50# 3x15 reps     Acupuncturist Location R knee   Electrical Stimulation Action IFC   Electrical Stimulation Parameters 1-10hz  x57min   Electrical Stimulation Goals Pain;Edema     Vasopneumatic   Number Minutes Vasopneumatic  15 minutes   Vasopnuematic Location  Knee   Vasopneumatic Pressure Medium     Manual Therapy   Manual Therapy Passive ROM   Passive ROM PROM of R knee into extension to promote increased ROM                  PT Short Term Goals - 04/13/17 0810      PT SHORT TERM GOAL #1   Title pt will be independent with his initial HEP   Time 3   Period Weeks   Status Achieved     PT SHORT TERM GOAL #2   Title Pt will improve right knee flexion to >100 degrees actively in order to improve amb.   Time 3   Period Weeks   Status Achieved  AROM R knee flexion 107 deg 04/13/2017           PT Long Term Goals - 04/20/17 0815       PT LONG TERM GOAL #1   Title Pt will improve his FOTO score from 61% limitation to </= 40% limitation.   Time 8   Period Weeks   Status Achieved  FOTO score 32% current limitation 04/13/2017     PT LONG TERM GOAL #2   Title Pt will be able to amb with no assistive device >/= 1000 feet on community surfaces.   Time 8   Period Weeks   Status Achieved  No AD used per patient report 04/20/2017     PT LONG TERM GOAL #3   Title Pt will improve his R LE knee flexion and extension to 5/5 in order to improve function.   Time 8   Period Weeks   Status On-going     PT LONG TERM GOAL #4   Title Pt will improve R knee flexion to >/= 125 degrees in order to improve functional mobility.    Time 8   Period Weeks   Status On-going  AROM R knee flexion 110 deg 04/20/2017     PT LONG TERM GOAL #5   Title Pt will improve R knee extension to </= 5 degrees of flexion in order to improve gait.    Time 8   Period Weeks   Status On-going  -9 deg extension R knee 04/13/2017     PT LONG TERM GOAL #6   Title Pt will be albe to improve R knee pain </= 3/10 with amb and ADL's.    Time 8   Period Weeks   Status On-going               Plan - 05/07/17 0258    Clinical Impression Statement Patient progressing and tolerated treatment fairly well today. Patient has ongoing edema in right knee and pain with manual flexion ROM. Patient reported daily self stretches and exercises. Today patient had restrictions with ROm and may from ongoing edema in knee. Patient unale to meet remaining goals due to ROM and edema limitations.    Rehab Potential Excellent   PT Frequency 3x / week   PT Duration 8 weeks   PT Treatment/Interventions ADLs/Self Care Home Management;Gait training;Patient/family education;Taping;Vasopneumatic Device;Cryotherapy;Electrical Stimulation;Moist Heat;Ultrasound;Therapeutic exercise;Therapeutic activities;Functional mobility training;Stair training;Manual techniques;Passive range of  motion   PT Next Visit Plan Continue with R knee ROM focus with modalities and PRN per MPT POC. (MD. Veverly Fells 05/16/17)   Consulted and Agree with Plan of Care Patient      Patient will benefit from skilled therapeutic intervention in order to improve the following deficits and impairments:  Abnormal gait, Pain, Increased edema, Decreased activity tolerance, Decreased mobility, Difficulty walking, Decreased strength, Decreased range of motion  Visit Diagnosis: Stiffness of right knee, not elsewhere classified  Acute pain of right knee  Localized edema     Problem List Patient Active Problem List   Diagnosis Date Noted  . Status post total knee replacement, right 03/02/2017  . Chronic intractable headache 08/09/2016  . Other specified hypothyroidism 03/02/2016  . A-fib ([place]) 12/24/2015  . Non-ischemic cardiomyopathy ([place]) 11/07/2015  . Long term (current) use of anticoagulants 11/07/2015  . Diaphoresis 11/01/2015  . Chest pain 11/01/2015  . PAF (paroxysmal atrial fibrillation) ([place]) 07/15/2015  . Heart palpitations 07/11/2015  . Dyspnea 02/18/2015  . Anemia, iron deficiency 09/10/2013  . Diaphragmatic hernia without mention of obstruction or gangrene   . Hypothyroidism   . Malaise and fatigue   . Arteritis, unspecified ([place])   . Iron deficiency anemia, unspecified   . Obesity   . BPH (benign prostatic hyperplasia)   . Essential hypertension, benign   . Hyperlipidemia     Kline Bulthuis P, PTA 05/07/2017, 8:29 AM  [facility] [place], [place], [address] Phone: [phone]   Fax:  [phone]  Name: Troy Acosta MRN: [id number] Date of Birth: [birth year]

## 2017-05-09 ENCOUNTER — Ambulatory Visit: Payer: BLUE CROSS/BLUE SHIELD | Attending: Orthopedic Surgery | Admitting: Physical Therapy

## 2017-05-09 ENCOUNTER — Encounter: Payer: Self-pay | Admitting: Physical Therapy

## 2017-05-09 ENCOUNTER — Other Ambulatory Visit: Payer: Self-pay | Admitting: *Deleted

## 2017-05-09 ENCOUNTER — Telehealth: Payer: Self-pay | Admitting: Family Medicine

## 2017-05-09 DIAGNOSIS — E876 Hypokalemia: Secondary | ICD-10-CM

## 2017-05-09 DIAGNOSIS — M25561 Pain in right knee: Secondary | ICD-10-CM | POA: Diagnosis not present

## 2017-05-09 DIAGNOSIS — M25661 Stiffness of right knee, not elsewhere classified: Secondary | ICD-10-CM

## 2017-05-09 DIAGNOSIS — R7989 Other specified abnormal findings of blood chemistry: Secondary | ICD-10-CM

## 2017-05-09 DIAGNOSIS — R600 Localized edema: Secondary | ICD-10-CM | POA: Diagnosis not present

## 2017-05-09 NOTE — Telephone Encounter (Signed)
Per pt, he had labs done thru [facility] Per pt, thyroid levels and K+ were elevated Nurse at [facility] was to fax over lab results Please review and advise

## 2017-05-09 NOTE — Therapy (Signed)
[facility] [place], [place], [address] Phone: [phone]   Fax:  [phone]  Physical Therapy Treatment  Patient Details  Name: Troy Acosta MRN: [id number] Date of Birth: 1951/08/29 Referring Provider: Esmond Plants, MD  Encounter Date: 05/09/2017      PT End of Session - 05/09/17 0746    Visit Number 21   Number of Visits 20   Date for PT Re-Evaluation 05/21/17   PT Start Time 0732   PT Stop Time 0825   PT Time Calculation (min) 53 min   Activity Tolerance Patient tolerated treatment well   Behavior During Therapy [facility] for tasks assessed/performed      Past Medical History:  Diagnosis Date  . Anxiety   . Arteritis, unspecified ([place])   . Arthritis    hands, knees, feet  . Diaphragmatic hernia without mention of obstruction or gangrene   . Dysrhythmia    afib, ablation- 12/2015, no problems since, off blood thinner- 09/2016  . Essential hypertension, benign   . Headache    had MRI- was consulted with Dr. Jannifer Franklin, but the consult, headache has resolved.  . Hyperplasia of prostate   . Iron deficiency anemia, unspecified   . Malaise and fatigue   . Obesity   . Other and unspecified hyperlipidemia   . Paroxysmal atrial fibrillation (HCC)   . Unspecified hypothyroidism     Past Surgical History:  Procedure Laterality Date  . ELECTROPHYSIOLOGIC STUDY N/A 12/24/2015   Afib ablation by Dr Rayann Heman  . KNEE SURGERY Right   . TOTAL KNEE ARTHROPLASTY Right 03/02/2017   Procedure: RIGHT TOTAL KNEE ARTHROPLASTY;  Surgeon: [name], MD;  Location: [facility];  Service: Orthopedics;  Laterality: Right;    There were no vitals filed for this visit.      Subjective Assessment - 05/09/17 0736    Subjective Reports that his knee "hurts like hell" and reports not sleeping well but thinks it may be due to rainy, damp weather.   Limitations Walking   How long can you sit comfortably? 2 hours   How long can you stand  comfortably? 15 minutes   How long can you walk comfortably? 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 Yes   Pain Score 4    Pain Location Knee   Pain Orientation Right   Pain Descriptors / Indicators Discomfort   Pain Type Surgical pain   Pain Onset More than a month ago            [facility] PT Assessment - 05/09/17 0001      Assessment   Medical Diagnosis Right TKA   Onset Date/Surgical Date 03/02/17   Hand Dominance Right   Next MD Visit 05/16/2017   Prior Therapy yes, 5 HHPT visits following surgery     Precautions   Precautions None     Restrictions   Weight Bearing Restrictions No     ROM / Strength   AROM / PROM / Strength AROM     AROM   Overall AROM  Deficits   AROM Assessment Site Knee   Right/Left Knee Right   Right Knee Extension -8   Right Knee Flexion 112                     [facility] Adult PT Treatment/Exercise - 05/09/17 0001      Knee/Hip Exercises: Aerobic  Nustep L5 x14 min UE/LE     Knee/Hip Exercises: Machines for Strengthening   Cybex Knee Extension 20# 3x10 reps   Cybex Knee Flexion 50# 3x10 reps   Cybex Leg Press 4 pl, seat 7, 3x10 reps     Knee/Hip Exercises: Standing   Forward Lunges Right;3 sets;10 reps     Modalities   Modalities Electrical Stimulation;Vasopneumatic     Acupuncturist Location R knee   Electrical Stimulation Action IFC   Electrical Stimulation Parameters 1-10 hz x15 min   Electrical Stimulation Goals Pain;Edema     Vasopneumatic   Number Minutes Vasopneumatic  15 minutes   Vasopnuematic Location  Knee   Vasopneumatic Pressure Medium   Vasopneumatic Temperature  54     Manual Therapy   Manual Therapy Passive ROM   Passive ROM PROM of R knee into extension to promote increased ROM                  PT Short Term Goals - 04/13/17 0810      PT SHORT TERM GOAL #1   Title pt  will be independent with his initial HEP   Time 3   Period Weeks   Status Achieved     PT SHORT TERM GOAL #2   Title Pt will improve right knee flexion to >100 degrees actively in order to improve amb.   Time 3   Period Weeks   Status Achieved  AROM R knee flexion 107 deg 04/13/2017           PT Long Term Goals - 04/20/17 0815      PT LONG TERM GOAL #1   Title Pt will improve his FOTO score from 61% limitation to </= 40% limitation.   Time 8   Period Weeks   Status Achieved  FOTO score 32% current limitation 04/13/2017     PT LONG TERM GOAL #2   Title Pt will be able to amb with no assistive device >/= 1000 feet on community surfaces.   Time 8   Period Weeks   Status Achieved  No AD used per patient report 04/20/2017     PT LONG TERM GOAL #3   Title Pt will improve his R LE knee flexion and extension to 5/5 in order to improve function.   Time 8   Period Weeks   Status On-going     PT LONG TERM GOAL #4   Title Pt will improve R knee flexion to >/= 125 degrees in order to improve functional mobility.    Time 8   Period Weeks   Status On-going  AROM R knee flexion 110 deg 04/20/2017     PT LONG TERM GOAL #5   Title Pt will improve R knee extension to </= 5 degrees of flexion in order to improve gait.    Time 8   Period Weeks   Status On-going  -9 deg extension R knee 04/13/2017     PT LONG TERM GOAL #6   Title Pt will be albe to improve R knee pain </= 3/10 with amb and ADL's.    Time 8   Period Weeks   Status On-going               Plan - 05/09/17 0845    Clinical Impression Statement Patient continues to present with increased knee tightness and discomfort. Patient still able to complete exercises as directed with only complaint being of tightness with standing lunges.  Following PROM of R knee into extension, AROM measured as 8-112 deg today. Normal modalities response noted following removal of the modalities.         Patient will benefit from skilled  therapeutic intervention in order to improve the following deficits and impairments:     Visit Diagnosis: Stiffness of right knee, not elsewhere classified  Acute pain of right knee  Localized edema     Problem List Patient Active Problem List   Diagnosis Date Noted  . Status post total knee replacement, right 03/02/2017  . Chronic intractable headache 08/09/2016  . Other specified hypothyroidism 03/02/2016  . A-fib ([place]) 12/24/2015  . Non-ischemic cardiomyopathy ([place]) 11/07/2015  . Long term (current) use of anticoagulants 11/07/2015  . Diaphoresis 11/01/2015  . Chest pain 11/01/2015  . PAF (paroxysmal atrial fibrillation) ([place]) 07/15/2015  . Heart palpitations 07/11/2015  . Dyspnea 02/18/2015  . Anemia, iron deficiency 09/10/2013  . Diaphragmatic hernia without mention of obstruction or gangrene   . Hypothyroidism   . Malaise and fatigue   . Arteritis, unspecified ([place])   . Iron deficiency anemia, unspecified   . Obesity   . BPH (benign prostatic hyperplasia)   . Essential hypertension, benign   . Hyperlipidemia    Ahmed Prima, PTA 05/09/17 8:50 AM  [facility] [address] [place], [place], [address] Phone: [phone]   Fax:  [phone]  Name: Troy Acosta MRN: [id number] Date of Birth: 14-Jan-1951

## 2017-05-09 NOTE — Telephone Encounter (Signed)
I have not seen this lab work. Please get copies and put them on my desk for me to review we will call the patient after that.

## 2017-05-10 ENCOUNTER — Other Ambulatory Visit: Payer: BLUE CROSS/BLUE SHIELD

## 2017-05-10 DIAGNOSIS — E876 Hypokalemia: Secondary | ICD-10-CM

## 2017-05-10 DIAGNOSIS — R7989 Other specified abnormal findings of blood chemistry: Secondary | ICD-10-CM

## 2017-05-10 DIAGNOSIS — R946 Abnormal results of thyroid function studies: Secondary | ICD-10-CM | POA: Diagnosis not present

## 2017-05-10 NOTE — Telephone Encounter (Signed)
Pt notified to come in for repeat

## 2017-05-11 LAB — THYROID PANEL WITH TSH
Free Thyroxine Index: 2.1 (ref 1.2–4.9)
T3 UPTAKE RATIO: 25 % (ref 24–39)
T4 TOTAL: 8.5 ug/dL (ref 4.5–12.0)
TSH: 7.64 u[IU]/mL — ABNORMAL HIGH (ref 0.450–4.500)

## 2017-05-11 LAB — BMP8+EGFR
BUN / CREAT RATIO: 16 (ref 10–24)
BUN: 15 mg/dL (ref 8–27)
CALCIUM: 9.3 mg/dL (ref 8.6–10.2)
CO2: 28 mmol/L (ref 20–29)
Chloride: 98 mmol/L (ref 96–106)
Creatinine, Ser: 0.96 mg/dL (ref 0.76–1.27)
GFR, EST AFRICAN AMERICAN: 97 mL/min/{1.73_m2} (ref >59)
GFR, EST NON AFRICAN AMERICAN: 84 mL/min/{1.73_m2} (ref >59)
Glucose: 96 mg/dL (ref 65–99)
Potassium: 3.8 mmol/L (ref 3.5–5.2)
Sodium: 142 mmol/L (ref 134–144)

## 2017-05-14 ENCOUNTER — Encounter: Payer: Self-pay | Admitting: Physical Therapy

## 2017-05-14 ENCOUNTER — Ambulatory Visit: Payer: BLUE CROSS/BLUE SHIELD | Admitting: Physical Therapy

## 2017-05-14 DIAGNOSIS — M25561 Pain in right knee: Secondary | ICD-10-CM | POA: Diagnosis not present

## 2017-05-14 DIAGNOSIS — M25661 Stiffness of right knee, not elsewhere classified: Secondary | ICD-10-CM

## 2017-05-14 DIAGNOSIS — R600 Localized edema: Secondary | ICD-10-CM | POA: Diagnosis not present

## 2017-05-14 NOTE — Therapy (Addendum)
[facility] [place], [place], [address] Phone: [phone]   Fax:  [phone]  Physical Therapy Treatment  Patient Details  Name: Troy Acosta MRN: [id number] Date of Birth: 22-Mar-1951 Referring Provider: Esmond Plants, MD  Encounter Date: 05/14/2017      PT End of Session - 05/14/17 0729    Visit Number 22   Number of Visits 28   Date for PT Re-Evaluation 06/18/17   PT Start Time 0729   PT Stop Time 0822   PT Time Calculation (min) 53 min   Activity Tolerance Patient tolerated treatment well   Behavior During Therapy [facility] for tasks assessed/performed      Past Medical History:  Diagnosis Date  . Anxiety   . Arteritis, unspecified ([place])   . Arthritis    hands, knees, feet  . Diaphragmatic hernia without mention of obstruction or gangrene   . Dysrhythmia    afib, ablation- 12/2015, no problems since, off blood thinner- 09/2016  . Essential hypertension, benign   . Headache    had MRI- was consulted with Dr. Jannifer Franklin, but the consult, headache has resolved.  . Hyperplasia of prostate   . Iron deficiency anemia, unspecified   . Malaise and fatigue   . Obesity   . Other and unspecified hyperlipidemia   . Paroxysmal atrial fibrillation (HCC)   . Unspecified hypothyroidism     Past Surgical History:  Procedure Laterality Date  . ELECTROPHYSIOLOGIC STUDY N/A 12/24/2015   Afib ablation by Dr Rayann Heman  . KNEE SURGERY Right   . TOTAL KNEE ARTHROPLASTY Right 03/02/2017   Procedure: RIGHT TOTAL KNEE ARTHROPLASTY;  Surgeon: [name], MD;  Location: [facility];  Service: Orthopedics;  Laterality: Right;    There were no vitals filed for this visit.      Subjective Assessment - 05/14/17 0728    Subjective Reports that his knee feels like usual and is tight. Reports that he has already walked half a mile and been on stationary bike this morning.   Limitations Walking   How long can you sit comfortably? 2 hours   How long  can you stand comfortably? 15 minutes   How long can you walk comfortably? 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 Yes   Pain Score 3    Pain Location Knee   Pain Orientation Right   Pain Descriptors / Indicators Discomfort;Tightness   Pain Type Surgical pain   Pain Onset More than a month ago            [facility] PT Assessment - 05/14/17 0001      Assessment   Medical Diagnosis Right TKA   Onset Date/Surgical Date 03/02/17   Hand Dominance Right   Next MD Visit 05/16/2017   Prior Therapy yes, 5 HHPT visits following surgery     Precautions   Precautions None     Restrictions   Weight Bearing Restrictions No     ROM / Strength   AROM / PROM / Strength AROM;Strength     AROM   Overall AROM  Deficits;Within functional limits for tasks performed   AROM Assessment Site Knee   Right/Left Knee Right   Right Knee Extension -8   Right Knee Flexion 120     Strength   Overall Strength Within functional limits for tasks performed   Strength Assessment Site Knee   Right/Left Knee Right  Right Knee Flexion 5/5   Right Knee Extension 5/5                     [facility] Adult PT Treatment/Exercise - 05/14/17 0001      Knee/Hip Exercises: Aerobic   Nustep L5 x15 min UE/LE     Knee/Hip Exercises: Machines for Strengthening   Cybex Knee Extension 20# 3x10 reps   Cybex Knee Flexion 50# 3x10 reps   Cybex Leg Press 3 pl, seat 7, 3x10 reps     Knee/Hip Exercises: Standing   Forward Lunges Right;2 sets;10 reps;4 seconds   Terminal Knee Extension Limitations RLE orange XTS x20 reps   Wall Squat 1 set;15 reps     Modalities   Modalities Electrical Stimulation;Vasopneumatic     Electrical Stimulation   Electrical Stimulation Location R knee   Electrical Stimulation Action IFC   Electrical Stimulation Parameters 1-10 hz x15 min   Electrical Stimulation Goals Pain;Edema     Vasopneumatic    Number Minutes Vasopneumatic  15 minutes   Vasopnuematic Location  Knee   Vasopneumatic Pressure Medium   Vasopneumatic Temperature  34                  PT Short Term Goals - 04/13/17 0810      PT SHORT TERM GOAL #1   Title pt will be independent with his initial HEP   Time 3   Period Weeks   Status Achieved     PT SHORT TERM GOAL #2   Title Pt will improve right knee flexion to >100 degrees actively in order to improve amb.   Time 3   Period Weeks   Status Achieved  AROM R knee flexion 107 deg 04/13/2017           PT Long Term Goals - 05/14/17 0808      PT LONG TERM GOAL #1   Title Pt will improve his FOTO score from 61% limitation to </= 40% limitation.   Time 8   Period Weeks   Status Achieved  FOTO score 32% current limitation 04/13/2017     PT LONG TERM GOAL #2   Title Pt will be able to amb with no assistive device >/= 1000 feet on community surfaces.   Time 8   Period Weeks   Status Achieved  No AD used per patient report 04/20/2017     PT LONG TERM GOAL #3   Title Pt will improve his R LE knee flexion and extension to 5/5 in order to improve function.   Time 8   Period Weeks   Status Achieved  R knee MMT 5/5 05/14/2017     PT LONG TERM GOAL #4   Title Pt will improve R knee flexion to >/= 125 degrees in order to improve functional mobility.    Time 8   Period Weeks   Status On-going  AROM R knee flexion 120 deg 05/14/2017     PT LONG TERM GOAL #5   Title Pt will improve R knee extension to </= 5 degrees of flexion in order to improve gait.    Time 8   Period Weeks   Status On-going  -8 deg extension R knee 05/14/2017     PT LONG TERM GOAL #6   Title Pt will be albe to improve R knee pain </= 3/10 with amb and ADL's.    Time 8   Period Weeks   Status On-going  Plan - 05/14/17 0809    Clinical Impression Statement Patient continues to present in clinic with R knee tightness and discomfort. Patient able to complete  all exercises as directed without complaint of any increased pain. Patient's R knee MMT 5/5 throughout and AROM of R knee measured as 8-120 deg today in clinic. Normal modalities response noted following removal of the modalities.   Rehab Potential Excellent   PT Frequency 3x / week   PT Duration 8 weeks   PT Treatment/Interventions ADLs/Self Care Home Management;Gait training;Patient/family education;Taping;Vasopneumatic Device;Cryotherapy;Electrical Stimulation;Moist Heat;Ultrasound;Therapeutic exercise;Therapeutic activities;Functional mobility training;Stair training;Manual techniques;Passive range of motion   PT Next Visit Plan Continue with R knee ROM focus with modalities and PRN per MPT POC. (MD. Veverly Fells 05/16/17)   PT Home Exercise Plan Quad sets, hamstring stretching, (HEP provided by HHPT with standing hip exercises)   Consulted and Agree with Plan of Care Patient      Patient will benefit from skilled therapeutic intervention in order to improve the following deficits and impairments:  Abnormal gait, Pain, Increased edema, Decreased activity tolerance, Decreased mobility, Difficulty walking, Decreased strength, Decreased range of motion  Visit Diagnosis: Stiffness of right knee, not elsewhere classified  Acute pain of right knee  Localized edema     Problem List Patient Active Problem List   Diagnosis Date Noted  . Status post total knee replacement, right 03/02/2017  . Chronic intractable headache 08/09/2016  . Other specified hypothyroidism 03/02/2016  . A-fib ([place]) 12/24/2015  . Non-ischemic cardiomyopathy ([place]) 11/07/2015  . Long term (current) use of anticoagulants 11/07/2015  . Diaphoresis 11/01/2015  . Chest pain 11/01/2015  . PAF (paroxysmal atrial fibrillation) ([place]) 07/15/2015  . Heart palpitations 07/11/2015  . Dyspnea 02/18/2015  . Anemia, iron deficiency 09/10/2013  . Diaphragmatic hernia without mention of obstruction or gangrene   . Hypothyroidism   .  Malaise and fatigue   . Arteritis, unspecified ([place])   . Iron deficiency anemia, unspecified   . Obesity   . BPH (benign prostatic hyperplasia)   . Essential hypertension, benign   . Hyperlipidemia     Ahmed Prima, PTA 05/14/17 1:42 PM  [facility] [place], [place], [address] Phone: [phone]   Fax:  [phone]  Name: Tarl Cephas MRN: [id number] Date of Birth: January 03, 1951

## 2017-05-18 ENCOUNTER — Ambulatory Visit: Payer: BLUE CROSS/BLUE SHIELD | Admitting: Physical Therapy

## 2017-05-18 ENCOUNTER — Encounter: Payer: Self-pay | Admitting: Physical Therapy

## 2017-05-18 DIAGNOSIS — R600 Localized edema: Secondary | ICD-10-CM

## 2017-05-18 DIAGNOSIS — M25661 Stiffness of right knee, not elsewhere classified: Secondary | ICD-10-CM

## 2017-05-18 DIAGNOSIS — M25561 Pain in right knee: Secondary | ICD-10-CM | POA: Diagnosis not present

## 2017-05-18 NOTE — Therapy (Signed)
[facility] [place], [place], [address] Phone: [phone]   Fax:  [phone]  Physical Therapy Treatment  Patient Details  Name: Troy Acosta MRN: [id number] Date of Birth: Mar 02, 1951 Referring Provider: Esmond Plants, MD  Encounter Date: 05/18/2017      PT End of Session - 05/18/17 0734    Visit Number 23   Number of Visits 33  NO for 2x/week for 1 month   Date for PT Re-Evaluation 06/18/17   PT Start Time 0730   PT Stop Time 0826   PT Time Calculation (min) 56 min   Activity Tolerance Patient tolerated treatment well   Behavior During Therapy [facility] for tasks assessed/performed      Past Medical History:  Diagnosis Date  . Anxiety   . Arteritis, unspecified ([place])   . Arthritis    hands, knees, feet  . Diaphragmatic hernia without mention of obstruction or gangrene   . Dysrhythmia    afib, ablation- 12/2015, no problems since, off blood thinner- 09/2016  . Essential hypertension, benign   . Headache    had MRI- was consulted with Dr. Jannifer Franklin, but the consult, headache has resolved.  . Hyperplasia of prostate   . Iron deficiency anemia, unspecified   . Malaise and fatigue   . Obesity   . Other and unspecified hyperlipidemia   . Paroxysmal atrial fibrillation (HCC)   . Unspecified hypothyroidism     Past Surgical History:  Procedure Laterality Date  . ELECTROPHYSIOLOGIC STUDY N/A 12/24/2015   Afib ablation by Dr Rayann Heman  . KNEE SURGERY Right   . TOTAL KNEE ARTHROPLASTY Right 03/02/2017   Procedure: RIGHT TOTAL KNEE ARTHROPLASTY;  Surgeon: [name], MD;  Location: [facility];  Service: Orthopedics;  Laterality: Right;    There were no vitals filed for this visit.      Subjective Assessment - 05/18/17 0733    Subjective Reports that MD said to return to work on Monday but to continue PT for another month. Has been doing prone hangs at home with 5# ankleweight.   Limitations Walking   How long can you sit  comfortably? 2 hours   How long can you stand comfortably? 15 minutes   How long can you walk comfortably? 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 Yes   Pain Score 2    Pain Location Knee   Pain Orientation Right   Pain Descriptors / Indicators Discomfort   Pain Type Surgical pain   Pain Onset More than a month ago            [facility] PT Assessment - 05/18/17 0001      Assessment   Medical Diagnosis Right TKA   Onset Date/Surgical Date 03/02/17   Hand Dominance Right   Next MD Visit 08/2017   Prior Therapy yes, 5 HHPT visits following surgery     Precautions   Precautions None     Restrictions   Weight Bearing Restrictions No     ROM / Strength   AROM / PROM / Strength AROM     AROM   Overall AROM  Deficits   AROM Assessment Site Knee   Right/Left Knee Right   Right Knee Extension -7                     [facility] Adult PT Treatment/Exercise - 05/18/17 0001  Knee/Hip Exercises: Aerobic   Nustep L5, seat 12, x13 min     Knee/Hip Exercises: Machines for Strengthening   Cybex Knee Extension 30# 3x10 reps   Cybex Knee Flexion 60# 3x10 reps   Cybex Leg Press 2 pl ,seat 6 x10 reps RLE only; 3 pl, seat 6. 3x10 reps     Knee/Hip Exercises: Standing   Terminal Knee Extension Limitations RLE orange XTS x20 reps   Step Down Right;3 sets;10 reps;Hand Hold: 2;Step Height: 4"     Knee/Hip Exercises: Supine   Other Supine Knee/Hip Exercises Overpressure into extension with 5# weight x6 min     Modalities   Modalities Electrical Stimulation;Vasopneumatic     Electrical Stimulation   Electrical Stimulation Location R knee   Electrical Stimulation Action IFC   Electrical Stimulation Parameters 1-10 hz x15 min   Electrical Stimulation Goals Pain;Edema     Vasopneumatic   Number Minutes Vasopneumatic  15 minutes   Vasopnuematic Location  Knee   Vasopneumatic Pressure Medium    Vasopneumatic Temperature  34                  PT Short Term Goals - 04/13/17 0810      PT SHORT TERM GOAL #1   Title pt will be independent with his initial HEP   Time 3   Period Weeks   Status Achieved     PT SHORT TERM GOAL #2   Title Pt will improve right knee flexion to >100 degrees actively in order to improve amb.   Time 3   Period Weeks   Status Achieved  AROM R knee flexion 107 deg 04/13/2017           PT Long Term Goals - 05/14/17 0808      PT LONG TERM GOAL #1   Title Pt will improve his FOTO score from 61% limitation to </= 40% limitation.   Time 8   Period Weeks   Status Achieved  FOTO score 32% current limitation 04/13/2017     PT LONG TERM GOAL #2   Title Pt will be able to amb with no assistive device >/= 1000 feet on community surfaces.   Time 8   Period Weeks   Status Achieved  No AD used per patient report 04/20/2017     PT LONG TERM GOAL #3   Title Pt will improve his R LE knee flexion and extension to 5/5 in order to improve function.   Time 8   Period Weeks   Status Achieved  R knee MMT 5/5 05/14/2017     PT LONG TERM GOAL #4   Title Pt will improve R knee flexion to >/= 125 degrees in order to improve functional mobility.    Time 8   Period Weeks   Status On-going  AROM R knee flexion 120 deg 05/14/2017     PT LONG TERM GOAL #5   Title Pt will improve R knee extension to </= 5 degrees of flexion in order to improve gait.    Time 8   Period Weeks   Status On-going  -8 deg extension R knee 05/14/2017     PT LONG TERM GOAL #6   Title Pt will be albe to improve R knee pain </= 3/10 with amb and ADL's.    Time 8   Period Weeks   Status On-going               Plan - 05/18/17 7564  Clinical Impression Statement Patient tolerated today's treatment well with no complaints of any increased pain with exercises today. Focus of today's treatment was R knee extension with holds during exercise to increase strength of the R  Quad. No complaints by patient with supine overpressure into extension with 5# ankleweight. AROM R knee extension lacking 7 deg from full extension. Normal modalities response noted following removal of the modalities.   Rehab Potential Excellent   PT Frequency 3x / week   PT Duration 8 weeks   PT Treatment/Interventions ADLs/Self Care Home Management;Gait training;Patient/family education;Taping;Vasopneumatic Device;Cryotherapy;Electrical Stimulation;Moist Heat;Ultrasound;Therapeutic exercise;Therapeutic activities;Functional mobility training;Stair training;Manual techniques;Passive range of motion   PT Next Visit Plan Continue with R knee ROM focus with modalities and PRN per MPT POC. (MD. Veverly Fells 05/16/17)   PT Home Exercise Plan Quad sets, hamstring stretching, (HEP provided by HHPT with standing hip exercises)   Consulted and Agree with Plan of Care Patient      Patient will benefit from skilled therapeutic intervention in order to improve the following deficits and impairments:  Abnormal gait, Pain, Increased edema, Decreased activity tolerance, Decreased mobility, Difficulty walking, Decreased strength, Decreased range of motion  Visit Diagnosis: Stiffness of right knee, not elsewhere classified  Acute pain of right knee  Localized edema     Problem List Patient Active Problem List   Diagnosis Date Noted  . Status post total knee replacement, right 03/02/2017  . Chronic intractable headache 08/09/2016  . Other specified hypothyroidism 03/02/2016  . A-fib ([place]) 12/24/2015  . Non-ischemic cardiomyopathy ([place]) 11/07/2015  . Long term (current) use of anticoagulants 11/07/2015  . Diaphoresis 11/01/2015  . Chest pain 11/01/2015  . PAF (paroxysmal atrial fibrillation) ([place]) 07/15/2015  . Heart palpitations 07/11/2015  . Dyspnea 02/18/2015  . Anemia, iron deficiency 09/10/2013  . Diaphragmatic hernia without mention of obstruction or gangrene   . Hypothyroidism   . Malaise and  fatigue   . Arteritis, unspecified ([place])   . Iron deficiency anemia, unspecified   . Obesity   . BPH (benign prostatic hyperplasia)   . Essential hypertension, benign   . Hyperlipidemia     Wynelle Fanny, PTA 05/18/2017, 8:33 AM  [facility] [address] [place], [place], [address] Phone: [phone]   Fax:  [phone]  Name: Troy Acosta MRN: [id number] Date of Birth: 1951/01/23

## 2017-05-22 ENCOUNTER — Ambulatory Visit: Payer: BLUE CROSS/BLUE SHIELD | Admitting: *Deleted

## 2017-05-22 DIAGNOSIS — M25561 Pain in right knee: Secondary | ICD-10-CM | POA: Diagnosis not present

## 2017-05-22 DIAGNOSIS — M25661 Stiffness of right knee, not elsewhere classified: Secondary | ICD-10-CM

## 2017-05-22 DIAGNOSIS — R600 Localized edema: Secondary | ICD-10-CM | POA: Diagnosis not present

## 2017-05-22 NOTE — Therapy (Signed)
[facility] [place], [place], [address] Phone: [phone]   Fax:  [phone]  Physical Therapy Treatment  Patient Details  Name: Troy Acosta MRN: [id number] Date of Birth: 1950/12/07 Referring Provider: Esmond Plants, MD  Encounter Date: 05/22/2017      PT End of Session - 05/22/17 1530    Visit Number 24   Number of Visits 36   Date for PT Re-Evaluation 06/18/17   PT Start Time 5732   PT Stop Time 1607   PT Time Calculation (min) 52 min      Past Medical History:  Diagnosis Date  . Anxiety   . Arteritis, unspecified ([place])   . Arthritis    hands, knees, feet  . Diaphragmatic hernia without mention of obstruction or gangrene   . Dysrhythmia    afib, ablation- 12/2015, no problems since, off blood thinner- 09/2016  . Essential hypertension, benign   . Headache    had MRI- was consulted with Dr. Jannifer Franklin, but the consult, headache has resolved.  . Hyperplasia of prostate   . Iron deficiency anemia, unspecified   . Malaise and fatigue   . Obesity   . Other and unspecified hyperlipidemia   . Paroxysmal atrial fibrillation (HCC)   . Unspecified hypothyroidism     Past Surgical History:  Procedure Laterality Date  . ELECTROPHYSIOLOGIC STUDY N/A 12/24/2015   Afib ablation by Dr Rayann Heman  . KNEE SURGERY Right   . TOTAL KNEE ARTHROPLASTY Right 03/02/2017   Procedure: RIGHT TOTAL KNEE ARTHROPLASTY;  Surgeon: [name], MD;  Location: [facility];  Service: Orthopedics;  Laterality: Right;    There were no vitals filed for this visit.      Subjective Assessment - 05/22/17 1533    Subjective Reports that MD said to return to work on Monday but to continue PT for another month. Has been doing prone hangs at home with 5# ankleweight.  Work on [employer]   Limitations Walking   How long can you sit comfortably? 2 hours   How long can you stand comfortably? 15 minutes   How long can you walk comfortably? 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 Yes   Pain Score 2    Pain Location Knee   Pain Orientation Right   Pain Descriptors / Indicators Discomfort   Pain Type Surgical pain   Pain Onset More than a month ago   Pain Frequency Constant                         [facility] Adult PT Treatment/Exercise - 05/22/17 0001      Knee/Hip Exercises: Aerobic   Nustep L5, seat 12, x15 min     Knee/Hip Exercises: Standing   Rocker Board 3 minutes  calf stretching     Knee/Hip Exercises: Supine   Other Supine Knee/Hip Exercises Overpressure into extension with 5# weight x6 min     Modalities   Modalities Electrical Stimulation;Vasopneumatic     Electrical Stimulation   Electrical Stimulation Location R knee  IFC x 15 mins 1-10hz     Electrical Stimulation Goals Pain;Edema     Vasopneumatic   Number Minutes Vasopneumatic  15 minutes   Vasopnuematic Location  Knee   Vasopneumatic Pressure Medium   Vasopneumatic Temperature  34     Manual Therapy   Manual Therapy Passive ROM  Passive ROM PROM of R knee into extension to promote increased ROM                  PT Short Term Goals - 04/13/17 0810      PT SHORT TERM GOAL #1   Title pt will be independent with his initial HEP   Time 3   Period Weeks   Status Achieved     PT SHORT TERM GOAL #2   Title Pt will improve right knee flexion to >100 degrees actively in order to improve amb.   Time 3   Period Weeks   Status Achieved  AROM R knee flexion 107 deg 04/13/2017           PT Long Term Goals - 05/14/17 0808      PT LONG TERM GOAL #1   Title Pt will improve his FOTO score from 61% limitation to </= 40% limitation.   Time 8   Period Weeks   Status Achieved  FOTO score 32% current limitation 04/13/2017     PT LONG TERM GOAL #2   Title Pt will be able to amb with no assistive device >/= 1000 feet on community surfaces.   Time 8    Period Weeks   Status Achieved  No AD used per patient report 04/20/2017     PT LONG TERM GOAL #3   Title Pt will improve his R LE knee flexion and extension to 5/5 in order to improve function.   Time 8   Period Weeks   Status Achieved  R knee MMT 5/5 05/14/2017     PT LONG TERM GOAL #4   Title Pt will improve R knee flexion to >/= 125 degrees in order to improve functional mobility.    Time 8   Period Weeks   Status On-going  AROM R knee flexion 120 deg 05/14/2017     PT LONG TERM GOAL #5   Title Pt will improve R knee extension to </= 5 degrees of flexion in order to improve gait.    Time 8   Period Weeks   Status On-going  -8 deg extension R knee 05/14/2017     PT LONG TERM GOAL #6   Title Pt will be albe to improve R knee pain </= 3/10 with amb and ADL's.    Time 8   Period Weeks   Status On-going               Plan - 05/22/17 1531    Clinical Impression Statement Pt arrived today doing fairly well with minimal increase in discomfort. Rx again focused on extension ROM. Normal modality response after removal of modalities.   Clinical Presentation Stable   Rehab Potential Excellent   PT Frequency 3x / week   PT Duration 8 weeks   PT Treatment/Interventions ADLs/Self Care Home Management;Gait training;Patient/family education;Taping;Vasopneumatic Device;Cryotherapy;Electrical Stimulation;Moist Heat;Ultrasound;Therapeutic exercise;Therapeutic activities;Functional mobility training;Stair training;Manual techniques;Passive range of motion   PT Next Visit Plan Continue with R knee ROM focus with modalities and PRN per MPT POC. (MD. Veverly Fells 05/16/17)   PT Home Exercise Plan Quad sets, hamstring stretching, (HEP provided by HHPT with standing hip exercises)   Consulted and Agree with Plan of Care Patient      Patient will benefit from skilled therapeutic intervention in order to improve the following deficits and impairments:  Abnormal gait, Pain, Increased edema, Decreased  activity tolerance, Decreased mobility, Difficulty walking, Decreased strength, Decreased range of motion  Visit Diagnosis: Stiffness  of right knee, not elsewhere classified  Acute pain of right knee  Localized edema     Problem List Patient Active Problem List   Diagnosis Date Noted  . Status post total knee replacement, right 03/02/2017  . Chronic intractable headache 08/09/2016  . Other specified hypothyroidism 03/02/2016  . A-fib ([place]) 12/24/2015  . Non-ischemic cardiomyopathy ([place]) 11/07/2015  . Long term (current) use of anticoagulants 11/07/2015  . Diaphoresis 11/01/2015  . Chest pain 11/01/2015  . PAF (paroxysmal atrial fibrillation) ([place]) 07/15/2015  . Heart palpitations 07/11/2015  . Dyspnea 02/18/2015  . Anemia, iron deficiency 09/10/2013  . Diaphragmatic hernia without mention of obstruction or gangrene   . Hypothyroidism   . Malaise and fatigue   . Arteritis, unspecified ([place])   . Iron deficiency anemia, unspecified   . Obesity   . BPH (benign prostatic hyperplasia)   . Essential hypertension, benign   . Hyperlipidemia     Olly Shiner,CHRISPTA 05/22/2017, 6:00 PM  [facility] [place], [place], [address] Phone: [phone]   Fax:  [phone]  Name: Troy Acosta MRN: [id number] Date of Birth: [birth year]

## 2017-05-24 ENCOUNTER — Ambulatory Visit: Payer: BLUE CROSS/BLUE SHIELD | Admitting: *Deleted

## 2017-05-24 DIAGNOSIS — M25661 Stiffness of right knee, not elsewhere classified: Secondary | ICD-10-CM | POA: Diagnosis not present

## 2017-05-24 DIAGNOSIS — R600 Localized edema: Secondary | ICD-10-CM | POA: Diagnosis not present

## 2017-05-24 DIAGNOSIS — M25561 Pain in right knee: Secondary | ICD-10-CM | POA: Diagnosis not present

## 2017-05-24 NOTE — Therapy (Signed)
[facility] [place], [place], [address] Phone: [phone]   Fax:  [phone]  Physical Therapy Treatment  Patient Details  Name: Troy Acosta MRN: [id number] Date of Birth: 28-Oct-1950 Referring Provider: Esmond Plants, MD  Encounter Date: 05/24/2017      PT End of Session - 05/24/17 1512    Visit Number 25   Number of Visits 36   Date for PT Re-Evaluation 06/18/17   PT Start Time 1430   PT Stop Time 1525   PT Time Calculation (min) 55 min      Past Medical History:  Diagnosis Date  . Anxiety   . Arteritis, unspecified ([place])   . Arthritis    hands, knees, feet  . Diaphragmatic hernia without mention of obstruction or gangrene   . Dysrhythmia    afib, ablation- 12/2015, no problems since, off blood thinner- 09/2016  . Essential hypertension, benign   . Headache    had MRI- was consulted with Dr. Jannifer Franklin, but the consult, headache has resolved.  . Hyperplasia of prostate   . Iron deficiency anemia, unspecified   . Malaise and fatigue   . Obesity   . Other and unspecified hyperlipidemia   . Paroxysmal atrial fibrillation (HCC)   . Unspecified hypothyroidism     Past Surgical History:  Procedure Laterality Date  . ELECTROPHYSIOLOGIC STUDY N/A 12/24/2015   Afib ablation by Dr Rayann Heman  . KNEE SURGERY Right   . TOTAL KNEE ARTHROPLASTY Right 03/02/2017   Procedure: RIGHT TOTAL KNEE ARTHROPLASTY;  Surgeon: [name], MD;  Location: [facility];  Service: Orthopedics;  Laterality: Right;    There were no vitals filed for this visit.      Subjective Assessment - 05/24/17 1452    Subjective Reports that MD said to return to work on Monday but to continue PT for another month. Has been doing prone hangs at home with 5# ankleweight.  Work on [employer]   Limitations Walking   How long can you sit comfortably? 2 hours   How long can you stand comfortably? 2 hours   How long can you walk comfortably? 30 mins   Diagnostic tests X-ray    Patient Stated Goals 2hrs   Currently in Pain? Yes                         [facility] Adult PT Treatment/Exercise - 05/24/17 0001      Knee/Hip Exercises: Aerobic   Nustep L5, seat 12, x15 min     Knee/Hip Exercises: Machines for Strengthening   Cybex Knee Extension 30# 3x10 reps   Cybex Knee Flexion 50# 3x10 reps   Cybex Leg Press 2 pl ,seat 6 x10 reps RLE only; 3 pl, seat 6. 3x10 reps     Knee/Hip Exercises: Standing   Rocker Board 3 minutes  calf stretching     Modalities   Modalities Electrical Stimulation;Vasopneumatic     Electrical Stimulation   Electrical Stimulation Location R knee  IFC x 15 mins 1-10hz     Electrical Stimulation Goals Pain;Edema     Vasopneumatic   Number Minutes Vasopneumatic  15 minutes   Vasopnuematic Location  Knee   Vasopneumatic Pressure Medium   Vasopneumatic Temperature  34     Manual Therapy   Manual Therapy Passive ROM   Passive ROM PROM of R knee into extension to promote increased ROM  PT Short Term Goals - 04/13/17 0810      PT SHORT TERM GOAL #1   Title pt will be independent with his initial HEP   Time 3   Period Weeks   Status Achieved     PT SHORT TERM GOAL #2   Title Pt will improve right knee flexion to >100 degrees actively in order to improve amb.   Time 3   Period Weeks   Status Achieved  AROM R knee flexion 107 deg 04/13/2017           PT Long Term Goals - 05/14/17 0808      PT LONG TERM GOAL #1   Title Pt will improve his FOTO score from 61% limitation to </= 40% limitation.   Time 8   Period Weeks   Status Achieved  FOTO score 32% current limitation 04/13/2017     PT LONG TERM GOAL #2   Title Pt will be able to amb with no assistive device >/= 1000 feet on community surfaces.   Time 8   Period Weeks   Status Achieved  No AD used per patient report 04/20/2017     PT LONG TERM GOAL #3   Title Pt will improve his R LE knee flexion and  extension to 5/5 in order to improve function.   Time 8   Period Weeks   Status Achieved  R knee MMT 5/5 05/14/2017     PT LONG TERM GOAL #4   Title Pt will improve R knee flexion to >/= 125 degrees in order to improve functional mobility.    Time 8   Period Weeks   Status On-going  AROM R knee flexion 120 deg 05/14/2017     PT LONG TERM GOAL #5   Title Pt will improve R knee extension to </= 5 degrees of flexion in order to improve gait.    Time 8   Period Weeks   Status On-going  -8 deg extension R knee 05/14/2017     PT LONG TERM GOAL #6   Title Pt will be albe to improve R knee pain </= 3/10 with amb and ADL's.    Time 8   Period Weeks   Status On-going               Plan - 05/24/17 1513    Clinical Impression Statement Pt arrived to clinic today with increased soreness after last Rx. He did well with Rx today and was able to tolerate stretching again with just soreness.-8 degrees of extension after Rx.   Rehab Potential Excellent   PT Frequency 3x / week   PT Duration 8 weeks   PT Treatment/Interventions ADLs/Self Care Home Management;Gait training;Patient/family education;Taping;Vasopneumatic Device;Cryotherapy;Electrical Stimulation;Moist Heat;Ultrasound;Therapeutic exercise;Therapeutic activities;Functional mobility training;Stair training;Manual techniques;Passive range of motion   PT Next Visit Plan Continue with R knee ROM focus with modalities and PRN per MPT POC. (MD. Veverly Fells 05/16/17)   PT Home Exercise Plan Quad sets, hamstring stretching, (HEP provided by HHPT with standing hip exercises)   Consulted and Agree with Plan of Care Patient      Patient will benefit from skilled therapeutic intervention in order to improve the following deficits and impairments:  Abnormal gait, Pain, Increased edema, Decreased activity tolerance, Decreased mobility, Difficulty walking, Decreased strength, Decreased range of motion  Visit Diagnosis: Stiffness of right knee, not  elsewhere classified  Acute pain of right knee  Localized edema     Problem List Patient Active Problem List  Diagnosis Date Noted  . Status post total knee replacement, right 03/02/2017  . Chronic intractable headache 08/09/2016  . Other specified hypothyroidism 03/02/2016  . A-fib ([place]) 12/24/2015  . Non-ischemic cardiomyopathy ([place]) 11/07/2015  . Long term (current) use of anticoagulants 11/07/2015  . Diaphoresis 11/01/2015  . Chest pain 11/01/2015  . PAF (paroxysmal atrial fibrillation) ([place]) 07/15/2015  . Heart palpitations 07/11/2015  . Dyspnea 02/18/2015  . Anemia, iron deficiency 09/10/2013  . Diaphragmatic hernia without mention of obstruction or gangrene   . Hypothyroidism   . Malaise and fatigue   . Arteritis, unspecified ([place])   . Iron deficiency anemia, unspecified   . Obesity   . BPH (benign prostatic hyperplasia)   . Essential hypertension, benign   . Hyperlipidemia     RAMSEUR,CHRIS, PTA 05/24/2017, 3:30 PM  [facility] [place], [place], [address] Phone: [phone]   Fax:  [phone]  Name: Troy Acosta MRN: [id number] Date of Birth: [birth year]

## 2017-05-28 ENCOUNTER — Telehealth: Payer: Self-pay | Admitting: Family Medicine

## 2017-05-28 ENCOUNTER — Ambulatory Visit: Payer: BLUE CROSS/BLUE SHIELD | Admitting: Physical Therapy

## 2017-05-28 ENCOUNTER — Encounter: Payer: Self-pay | Admitting: Physical Therapy

## 2017-05-28 DIAGNOSIS — R600 Localized edema: Secondary | ICD-10-CM | POA: Diagnosis not present

## 2017-05-28 DIAGNOSIS — M25661 Stiffness of right knee, not elsewhere classified: Secondary | ICD-10-CM

## 2017-05-28 DIAGNOSIS — M25561 Pain in right knee: Secondary | ICD-10-CM

## 2017-05-28 NOTE — Telephone Encounter (Signed)
Pt called with complaint of worsening fatigue appt scheduled

## 2017-05-28 NOTE — Therapy (Signed)
[facility] [place], [place], [address] Phone: [phone]   Fax:  [phone]  Physical Therapy Treatment  Patient Details  Name: Troy Acosta MRN: [id number] Date of Birth: 06-14-1951 Referring Provider: Esmond Plants, MD  Encounter Date: 05/28/2017      PT End of Session - 05/28/17 1428    Visit Number 26   Number of Visits 36   Date for PT Re-Evaluation 06/18/17   PT Start Time 1430   PT Stop Time 1528   PT Time Calculation (min) 58 min   Activity Tolerance Patient tolerated treatment well   Behavior During Therapy [facility] for tasks assessed/performed      Past Medical History:  Diagnosis Date  . Anxiety   . Arteritis, unspecified ([place])   . Arthritis    hands, knees, feet  . Diaphragmatic hernia without mention of obstruction or gangrene   . Dysrhythmia    afib, ablation- 12/2015, no problems since, off blood thinner- 09/2016  . Essential hypertension, benign   . Headache    had MRI- was consulted with Dr. Jannifer Franklin, but the consult, headache has resolved.  . Hyperplasia of prostate   . Iron deficiency anemia, unspecified   . Malaise and fatigue   . Obesity   . Other and unspecified hyperlipidemia   . Paroxysmal atrial fibrillation (HCC)   . Unspecified hypothyroidism     Past Surgical History:  Procedure Laterality Date  . ELECTROPHYSIOLOGIC STUDY N/A 12/24/2015   Afib ablation by Dr Rayann Heman  . KNEE SURGERY Right   . TOTAL KNEE ARTHROPLASTY Right 03/02/2017   Procedure: RIGHT TOTAL KNEE ARTHROPLASTY;  Surgeon: [name], MD;  Location: [facility];  Service: Orthopedics;  Laterality: Right;    There were no vitals filed for this visit.      Subjective Assessment - 05/28/17 1428    Subjective "I'm there but the work part is iffy." Reports continued pain at this time.   Limitations Walking   How long can you sit comfortably? 2 hours   How long can you stand comfortably? 2 hours   How long can you walk  comfortably? 30 mins   Diagnostic tests X-ray    Patient Stated Goals 2hrs   Currently in Pain? Other (Comment)  No pain assessment provided by patient            [facility] PT Assessment - 05/28/17 0001      Assessment   Medical Diagnosis Right TKA   Onset Date/Surgical Date 03/02/17   Hand Dominance Right   Next MD Visit 08/2017   Prior Therapy yes, 5 HHPT visits following surgery     Precautions   Precautions None     Restrictions   Weight Bearing Restrictions No     ROM / Strength   AROM / PROM / Strength AROM     AROM   Overall AROM  Deficits   AROM Assessment Site Knee   Right/Left Knee Right   Right Knee Extension -7   Right Knee Flexion 111                     [facility] Adult PT Treatment/Exercise - 05/28/17 0001      Knee/Hip Exercises: Aerobic   Stationary Bike L2 x15 min     Knee/Hip Exercises: Machines for Strengthening   Cybex Knee Extension 20# 3x10 reps with 5 sec hold at end range   Cybex Knee Flexion 50# 3x10 reps   Cybex Leg Press 3  pl, seat 6, 4x10 reps     Knee/Hip Exercises: Standing   Terminal Knee Extension Limitations Pink XTS RLE x30 reps with 5 sec hold     Modalities   Modalities Electrical Stimulation;Vasopneumatic     Electrical Stimulation   Electrical Stimulation Location R knee   Electrical Stimulation Action IFC   Electrical Stimulation Parameters 1-10 hz x15 min   Electrical Stimulation Goals Pain;Edema     Vasopneumatic   Number Minutes Vasopneumatic  15 minutes   Vasopnuematic Location  Knee   Vasopneumatic Pressure Medium   Vasopneumatic Temperature  34                  PT Short Term Goals - 04/13/17 0810      PT SHORT TERM GOAL #1   Title pt will be independent with his initial HEP   Time 3   Period Weeks   Status Achieved     PT SHORT TERM GOAL #2   Title Pt will improve right knee flexion to >100 degrees actively in order to improve amb.   Time 3   Period Weeks   Status Achieved  AROM  R knee flexion 107 deg 04/13/2017           PT Long Term Goals - 05/14/17 0808      PT LONG TERM GOAL #1   Title Pt will improve his FOTO score from 61% limitation to </= 40% limitation.   Time 8   Period Weeks   Status Achieved  FOTO score 32% current limitation 04/13/2017     PT LONG TERM GOAL #2   Title Pt will be able to amb with no assistive device >/= 1000 feet on community surfaces.   Time 8   Period Weeks   Status Achieved  No AD used per patient report 04/20/2017     PT LONG TERM GOAL #3   Title Pt will improve his R LE knee flexion and extension to 5/5 in order to improve function.   Time 8   Period Weeks   Status Achieved  R knee MMT 5/5 05/14/2017     PT LONG TERM GOAL #4   Title Pt will improve R knee flexion to >/= 125 degrees in order to improve functional mobility.    Time 8   Period Weeks   Status On-going  AROM R knee flexion 120 deg 05/14/2017     PT LONG TERM GOAL #5   Title Pt will improve R knee extension to </= 5 degrees of flexion in order to improve gait.    Time 8   Period Weeks   Status On-going  -8 deg extension R knee 05/14/2017     PT LONG TERM GOAL #6   Title Pt will be albe to improve R knee pain </= 3/10 with amb and ADL's.    Time 8   Period Weeks   Status On-going               Plan - 05/28/17 1516    Clinical Impression Statement Patient tolerated today's treatment well although he still has pain at work. No observable muscle weakness noted today although mod verbal and demo cues provided for proper technique of TKE. AROM measured today of R knee with 7-111 deg. Normal modalities response noted following removal of the modalities.   Rehab Potential Excellent   PT Frequency 3x / week   PT Duration 8 weeks   PT Treatment/Interventions ADLs/Self Care Home Management;Gait training;Patient/family  education;Taping;Vasopneumatic Device;Cryotherapy;Electrical Stimulation;Moist Heat;Ultrasound;Therapeutic exercise;Therapeutic  activities;Functional mobility training;Stair training;Manual techniques;Passive range of motion   PT Next Visit Plan Continue with R knee ROM focus with modalities and PRN per MPT POC. (MD. Veverly Fells 05/16/17)   PT Home Exercise Plan Quad sets, hamstring stretching, (HEP provided by HHPT with standing hip exercises)   Consulted and Agree with Plan of Care Patient      Patient will benefit from skilled therapeutic intervention in order to improve the following deficits and impairments:  Abnormal gait, Pain, Increased edema, Decreased activity tolerance, Decreased mobility, Difficulty walking, Decreased strength, Decreased range of motion  Visit Diagnosis: Stiffness of right knee, not elsewhere classified  Acute pain of right knee  Localized edema     Problem List Patient Active Problem List   Diagnosis Date Noted  . Status post total knee replacement, right 03/02/2017  . Chronic intractable headache 08/09/2016  . Other specified hypothyroidism 03/02/2016  . A-fib ([place]) 12/24/2015  . Non-ischemic cardiomyopathy ([place]) 11/07/2015  . Long term (current) use of anticoagulants 11/07/2015  . Diaphoresis 11/01/2015  . Chest pain 11/01/2015  . PAF (paroxysmal atrial fibrillation) ([place]) 07/15/2015  . Heart palpitations 07/11/2015  . Dyspnea 02/18/2015  . Anemia, iron deficiency 09/10/2013  . Diaphragmatic hernia without mention of obstruction or gangrene   . Hypothyroidism   . Malaise and fatigue   . Arteritis, unspecified ([place])   . Iron deficiency anemia, unspecified   . Obesity   . BPH (benign prostatic hyperplasia)   . Essential hypertension, benign   . Hyperlipidemia     Wynelle Fanny, PTA 05/28/2017, 3:52 PM  [facility] [address] [place], [place], [address] Phone: [phone]   Fax:  [phone]  Name: Gennie Dib MRN: [id number] Date of Birth: [birth year]

## 2017-05-30 ENCOUNTER — Ambulatory Visit: Payer: BLUE CROSS/BLUE SHIELD | Admitting: Physical Therapy

## 2017-05-30 ENCOUNTER — Encounter: Payer: Self-pay | Admitting: Physical Therapy

## 2017-05-30 DIAGNOSIS — M25561 Pain in right knee: Secondary | ICD-10-CM | POA: Diagnosis not present

## 2017-05-30 DIAGNOSIS — M25661 Stiffness of right knee, not elsewhere classified: Secondary | ICD-10-CM | POA: Diagnosis not present

## 2017-05-30 DIAGNOSIS — R600 Localized edema: Secondary | ICD-10-CM | POA: Diagnosis not present

## 2017-05-30 NOTE — Therapy (Signed)
[facility] [place], [place], [address] Phone: [phone]   Fax:  [phone]  Physical Therapy Treatment  Patient Details  Name: Troy Acosta MRN: [id number] Date of Birth: 09-Aug-1951 Referring Provider: Esmond Plants, MD  Encounter Date: 05/30/2017      PT End of Session - 05/30/17 1157    Visit Number 27   Number of Visits 36   Date for PT Re-Evaluation 06/18/17   PT Start Time 1116   PT Stop Time 1213   PT Time Calculation (min) 57 min   Activity Tolerance Patient tolerated treatment well   Behavior During Therapy [facility] for tasks assessed/performed      Past Medical History:  Diagnosis Date  . Anxiety   . Arteritis, unspecified ([place])   . Arthritis    hands, knees, feet  . Diaphragmatic hernia without mention of obstruction or gangrene   . Dysrhythmia    afib, ablation- 12/2015, no problems since, off blood thinner- 09/2016  . Essential hypertension, benign   . Headache    had MRI- was consulted with Dr. Jannifer Franklin, but the consult, headache has resolved.  . Hyperplasia of prostate   . Iron deficiency anemia, unspecified   . Malaise and fatigue   . Obesity   . Other and unspecified hyperlipidemia   . Paroxysmal atrial fibrillation (HCC)   . Unspecified hypothyroidism     Past Surgical History:  Procedure Laterality Date  . ELECTROPHYSIOLOGIC STUDY N/A 12/24/2015   Afib ablation by Dr Rayann Heman  . KNEE SURGERY Right   . TOTAL KNEE ARTHROPLASTY Right 03/02/2017   Procedure: RIGHT TOTAL KNEE ARTHROPLASTY;  Surgeon: [name], MD;  Location: [facility];  Service: Orthopedics;  Laterality: Right;    There were no vitals filed for this visit.      Subjective Assessment - 05/30/17 1118    Subjective Patient reported ongoing pain in knee    Limitations Walking   How long can you sit comfortably? 2 hours   How long can you stand comfortably? 2 hours   How long can you walk comfortably? 30 mins   Diagnostic tests  X-ray    Patient Stated Goals 2hrs   Currently in Pain? Yes   Pain Score 4    Pain Location Knee   Pain Orientation Right   Pain Descriptors / Indicators Discomfort   Pain Type Surgical pain   Pain Onset More than a month ago   Pain Frequency Constant   Aggravating Factors  hurts all the time more with prolong standing   Pain Relieving Factors meds and rest            [facility] PT Assessment - 05/30/17 0001      AROM   AROM Assessment Site Knee   Right/Left Knee Right   Right Knee Extension -10   Right Knee Flexion 95     PROM   PROM Assessment Site Knee   Right/Left Knee Right   Right Knee Extension -6   Right Knee Flexion 105                     [facility] Adult PT Treatment/Exercise - 05/30/17 0001      Knee/Hip Exercises: Aerobic   Stationary Bike L2 x15 min     Knee/Hip Exercises: Machines for Strengthening   Cybex Knee Extension 20# 3x15 reps with 5 sec hold at end range   Cybex Knee Flexion 50# 3x15 reps   Cybex Leg Press 3 pl, seat  6, 2x20 reps     Acupuncturist Location R knee   Electrical Stimulation Action IFC   Electrical Stimulation Parameters 1-10hz  x80min   Electrical Stimulation Goals Pain;Edema     Vasopneumatic   Number Minutes Vasopneumatic  15 minutes   Vasopnuematic Location  Knee   Vasopneumatic Pressure Medium     Manual Therapy   Manual Therapy Passive ROM   Passive ROM PROM of R knee into flexion and extension to promote increased ROM                  PT Short Term Goals - 04/13/17 0810      PT SHORT TERM GOAL #1   Title pt will be independent with his initial HEP   Time 3   Period Weeks   Status Achieved     PT SHORT TERM GOAL #2   Title Pt will improve right knee flexion to >100 degrees actively in order to improve amb.   Time 3   Period Weeks   Status Achieved  AROM R knee flexion 107 deg 04/13/2017           PT Long Term Goals - 05/14/17 0808      PT LONG TERM  GOAL #1   Title Pt will improve his FOTO score from 61% limitation to </= 40% limitation.   Time 8   Period Weeks   Status Achieved  FOTO score 32% current limitation 04/13/2017     PT LONG TERM GOAL #2   Title Pt will be able to amb with no assistive device >/= 1000 feet on community surfaces.   Time 8   Period Weeks   Status Achieved  No AD used per patient report 04/20/2017     PT LONG TERM GOAL #3   Title Pt will improve his R LE knee flexion and extension to 5/5 in order to improve function.   Time 8   Period Weeks   Status Achieved  R knee MMT 5/5 05/14/2017     PT LONG TERM GOAL #4   Title Pt will improve R knee flexion to >/= 125 degrees in order to improve functional mobility.    Time 8   Period Weeks   Status On-going  AROM R knee flexion 120 deg 05/14/2017     PT LONG TERM GOAL #5   Title Pt will improve R knee extension to </= 5 degrees of flexion in order to improve gait.    Time 8   Period Weeks   Status On-going  -8 deg extension R knee 05/14/2017     PT LONG TERM GOAL #6   Title Pt will be albe to improve R knee pain </= 3/10 with amb and ADL's.    Time 8   Period Weeks   Status On-going               Plan - 05/30/17 1159    Clinical Impression Statement Patient tolerated treatment well. Patient has ongoing pain level 4/10 and increases with prolong standing. Patient has limitatins with active motion in right knee yet improved with passive stretching. Patient progressing with strength and activity tolerance yet goals ongoing due to pain and full AROM deficts.   Rehab Potential Excellent   PT Frequency 3x / week   PT Duration 8 weeks   PT Treatment/Interventions ADLs/Self Care Home Management;Gait training;Patient/family education;Taping;Vasopneumatic Device;Cryotherapy;Electrical Stimulation;Moist Heat;Ultrasound;Therapeutic exercise;Therapeutic activities;Functional mobility training;Stair training;Manual techniques;Passive range of motion   PT  Next  Visit Plan Continue with R knee ROM focus with modalities and PRN per MPT POC   Consulted and Agree with Plan of Care Patient      Patient will benefit from skilled therapeutic intervention in order to improve the following deficits and impairments:  Abnormal gait, Pain, Increased edema, Decreased activity tolerance, Decreased mobility, Difficulty walking, Decreased strength, Decreased range of motion  Visit Diagnosis: Stiffness of right knee, not elsewhere classified  Acute pain of right knee  Localized edema     Problem List Patient Active Problem List   Diagnosis Date Noted  . Status post total knee replacement, right 03/02/2017  . Chronic intractable headache 08/09/2016  . Other specified hypothyroidism 03/02/2016  . A-fib ([place]) 12/24/2015  . Non-ischemic cardiomyopathy ([place]) 11/07/2015  . Long term (current) use of anticoagulants 11/07/2015  . Diaphoresis 11/01/2015  . Chest pain 11/01/2015  . PAF (paroxysmal atrial fibrillation) ([place]) 07/15/2015  . Heart palpitations 07/11/2015  . Dyspnea 02/18/2015  . Anemia, iron deficiency 09/10/2013  . Diaphragmatic hernia without mention of obstruction or gangrene   . Hypothyroidism   . Malaise and fatigue   . Arteritis, unspecified ([place])   . Iron deficiency anemia, unspecified   . Obesity   . BPH (benign prostatic hyperplasia)   . Essential hypertension, benign   . Hyperlipidemia     Christoper Bushey P, PTA 05/30/2017, 12:13 PM  [facility] [place], [place], [address] Phone: [phone]   Fax:  [phone]  Name: Troy Acosta MRN: [id number] Date of Birth: 15-Mar-1951

## 2017-05-31 ENCOUNTER — Encounter: Payer: Self-pay | Admitting: Family Medicine

## 2017-05-31 ENCOUNTER — Ambulatory Visit: Payer: BLUE CROSS/BLUE SHIELD | Admitting: Family Medicine

## 2017-05-31 ENCOUNTER — Ambulatory Visit (INDEPENDENT_AMBULATORY_CARE_PROVIDER_SITE_OTHER): Payer: BLUE CROSS/BLUE SHIELD | Admitting: Family Medicine

## 2017-05-31 ENCOUNTER — Encounter: Payer: BLUE CROSS/BLUE SHIELD | Admitting: Physical Therapy

## 2017-05-31 VITALS — BP 135/86 | HR 92 | Temp 98.5°F | Ht 71.0 in | Wt 248.0 lb

## 2017-05-31 DIAGNOSIS — D508 Other iron deficiency anemias: Secondary | ICD-10-CM | POA: Diagnosis not present

## 2017-05-31 DIAGNOSIS — I10 Essential (primary) hypertension: Secondary | ICD-10-CM

## 2017-05-31 DIAGNOSIS — I428 Other cardiomyopathies: Secondary | ICD-10-CM | POA: Diagnosis not present

## 2017-05-31 DIAGNOSIS — M1712 Unilateral primary osteoarthritis, left knee: Secondary | ICD-10-CM | POA: Diagnosis not present

## 2017-05-31 DIAGNOSIS — E034 Atrophy of thyroid (acquired): Secondary | ICD-10-CM | POA: Diagnosis not present

## 2017-05-31 DIAGNOSIS — D649 Anemia, unspecified: Secondary | ICD-10-CM | POA: Diagnosis not present

## 2017-05-31 DIAGNOSIS — I480 Paroxysmal atrial fibrillation: Secondary | ICD-10-CM | POA: Diagnosis not present

## 2017-05-31 NOTE — Patient Instructions (Signed)
Continue to take levothyroxine 150 g daily In addition take one half of a 25 g on Monday Wednesday and Friday and one 25 g the other days of the week We would need to repeat your thyroid profile in 6 weeks We are doing a hemoglobin today because of your history of iron deficiency anemia and a renal panel and we will call with those results as soon as they become available

## 2017-05-31 NOTE — Progress Notes (Signed)
Subjective:    Patient ID: Troy Acosta, male    DOB: [birth year], 66 y.o.   MRN: [id number]  HPI Pt here for worsening fatigue.The patient saw the cardiologist a couple months ago and he gave him a good report. He started having thyroid issues maybe 5-6 weeks ago. He had an elevated TSH at work and a repeat here was elevated. His dosage was increased around the first of this month but he did not increase it as we had recommended. I explained to the patient did have to have the increased dosage on board for probably 4-6 weeks for you can pick up changes in the level of the TSH. He understands this. The patient basically needs some reassurance about this.    Patient Active Problem List   Diagnosis Date Noted  . Status post total knee replacement, right 03/02/2017  . Chronic intractable headache 08/09/2016  . Other specified hypothyroidism 03/02/2016  . A-fib ([place]) 12/24/2015  . Non-ischemic cardiomyopathy ([place]) 11/07/2015  . Long term (current) use of anticoagulants 11/07/2015  . Diaphoresis 11/01/2015  . Chest pain 11/01/2015  . PAF (paroxysmal atrial fibrillation) ([place]) 07/15/2015  . Heart palpitations 07/11/2015  . Dyspnea 02/18/2015  . Anemia, iron deficiency 09/10/2013  . Diaphragmatic hernia without mention of obstruction or gangrene   . Hypothyroidism   . Malaise and fatigue   . Arteritis, unspecified ([place])   . Iron deficiency anemia, unspecified   . Obesity   . BPH (benign prostatic hyperplasia)   . Essential hypertension, benign   . Hyperlipidemia    Outpatient Encounter Prescriptions as of 05/31/2017  Medication Sig  . amLODipine (NORVASC) 5 MG tablet Take 1 tablet (5 mg total) by mouth daily.  Marland Kitchen aspirin (ASPIRIN CHILDRENS) 81 MG chewable tablet Chew 1 tablet (81 mg total) by mouth 2 (two) times daily.  . Cholecalciferol (VITAMIN D3) 1000 UNITS CAPS Take 1,000 Units by mouth at bedtime.   . hydrochlorothiazide (HYDRODIURIL) 25 MG tablet Take 1 tablet (25 mg total) by mouth  daily.  . iron polysaccharides (FERREX 150) 150 MG capsule Take 1 capsule (150 mg total) by mouth daily. (Patient taking differently: Take 150 mg by mouth at bedtime. )  . levothyroxine (SYNTHROID, LEVOTHROID) 150 MCG tablet Take 1 tablet (150 mcg total) by mouth daily before breakfast.  . levothyroxine (SYNTHROID, LEVOTHROID) 25 MCG tablet TAKE ONE-HALF TABLET BY MOUTH ONCE DAILY BEFORE BREAKFAST (Patient taking differently: Take 12.5 mcg by mouth daily before breakfast. TAKE ONE-HALF TABLET BY MOUTH ONCE DAILY BEFORE BREAKFAST)  . LORazepam (ATIVAN) 0.5 MG tablet TAKE ONE TABLET BY MOUTH ONCE DAILY AS NEEDED  . methocarbamol (ROBAXIN) 500 MG tablet Take 1 tablet (500 mg total) by mouth 3 (three) times daily as needed.  . metoprolol tartrate (LOPRESSOR) 25 MG tablet Take 12.5 mg by mouth daily as needed (Elevated heart rate).   Marland Kitchen olmesartan (BENICAR) 40 MG tablet Take 0.5 tablets (20 mg total) by mouth 2 (two) times daily.  . rosuvastatin (CRESTOR) 5 MG tablet Take 1 tablet (5 mg total) by mouth daily. (Patient taking differently: Take 5 mg by mouth every evening. )  . [DISCONTINUED] HYDROmorphone (DILAUDID) 2 MG tablet Take 1-2 tablets (2-4 mg total) by mouth every 4 (four) hours as needed for severe pain.  . [DISCONTINUED] ibuprofen (ADVIL,MOTRIN) 800 MG tablet Take 800 mg by mouth 2 (two) times daily.   Facility-Administered Encounter Medications as of 05/31/2017  Medication  . gadopentetate dimeglumine (MAGNEVIST) injection 20 mL  Review of Systems  Constitutional: Positive for fatigue.  HENT: Negative.   Eyes: Negative.   Respiratory: Negative.   Cardiovascular: Negative.   Gastrointestinal: Negative.   Endocrine: Negative.   Genitourinary: Negative.   Musculoskeletal: Negative.   Skin: Negative.   Allergic/Immunologic: Negative.   Neurological: Negative.   Hematological: Negative.   Psychiatric/Behavioral: Negative.        Objective:   Physical Exam  Constitutional: He  is oriented to person, place, and time. He appears well-developed and well-nourished. No distress.  The patient is alert and pleasant.  HENT:  Head: Normocephalic.  Eyes: Pupils are equal, round, and reactive to light. Conjunctivae and EOM are normal. Right eye exhibits no discharge. Left eye exhibits no discharge. No scleral icterus.  Neck: Normal range of motion. Neck supple. No thyromegaly present.  Cardiovascular: Normal rate, regular rhythm and normal heart sounds.   At 84/m  Pulmonary/Chest: Effort normal and breath sounds normal. He has no wheezes. He has no rales.  Musculoskeletal: Normal range of motion. He exhibits no edema.  Neurological: He is alert and oriented to person, place, and time.  Skin: Skin is warm and dry. No rash noted.  Psychiatric: He has a normal mood and affect. His behavior is normal. Judgment and thought content normal.  Nursing note and vitals reviewed.  BP 135/86 (BP Location: Left Arm)   Pulse 92   Temp 98.5 F (36.9 C) (Oral)   Ht '5\' 11"'  (1.803 m)   Wt 248 lb (112.5 kg)   BMI 34.59 kg/m         Assessment & Plan:  1. Fatigue associated with anemia - BMP8+EGFR - CBC with Differential/Platelet  2. Paroxysmal atrial fibrillation (HCC) -The patient remains in normal sinus rhythm  3. Essential hypertension -Blood pressure is good  4. Non-ischemic cardiomyopathy ([place]) -No chest pain and good visit with the cardiologist a couple months ago.  5. Iron deficiency anemia secondary to inadequate dietary iron intake -This is been an ongoing problem for years. He recently had knee surgery and did take some ibuprofen for short period of time but he stopped this.  6. Hypothyroidism due to acquired atrophy of thyroid - BMP8+EGFR - CBC with Differential/Platelet -Repeat thyroid profile in 6 weeks with the higher dose of thyroid medicine  Patient Instructions  Continue to take levothyroxine 150 g daily In addition take one half of a 25 g on Monday  Wednesday and Friday and one 25 g the other days of the week We would need to repeat your thyroid profile in 6 weeks We are doing a hemoglobin today because of your history of iron deficiency anemia and a renal panel and we will call with those results as soon as they become available  Arrie Senate MD

## 2017-06-01 ENCOUNTER — Other Ambulatory Visit: Payer: Self-pay | Admitting: *Deleted

## 2017-06-01 MED ORDER — METOPROLOL TARTRATE 25 MG PO TABS: 12.5000 mg | ORAL_TABLET | Freq: Every day | ORAL | 1 refills | Status: DC | PRN

## 2017-06-02 IMAGING — CT CT HEART MORP W/ CTA COR W/ SCORE W/ CA W/CM &/OR W/O CM
1 of 10 series · 1 of 20 positions shown, 2 images · IV contrast (Iodine)
Comparison: None.

CLINICAL DATA: 64 year old male with atrial fibrillation scheduled
for an ablation.

EXAM:
Cardiac/Coronary  CT
TECHNIQUE: The patient was scanned on a Philips 256 scanner.

[Series 200: locator · axial · 0.35mm/px · z∈[-158,-158]mm · 1 of 1 slices shown, 2 images]
[im 1/1  vessel]
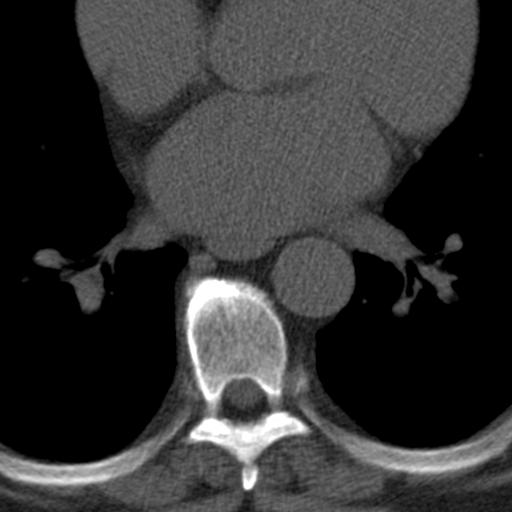
[im 1/1  lung]
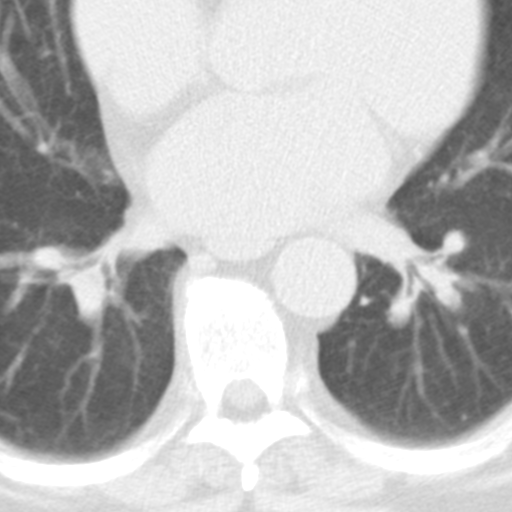

[1 of 20 positions shown; findings below may reference images not displayed]

FINDINGS: A 120 kV prospective scan was triggered in the descending thoracic
aorta at 111 HU's. Axial non-contrast 3 mm slices were carried out
through the heart. The data set was analyzed on a dedicated work
station and scored using the Agatson method. Gantry rotation speed
was 270 msecs and collimation was .9 mm. No beta blockade and 0.4 mg
of sl NTG was given. The 3D data set was reconstructed in 5%
intervals of the 67-82 % of the R-R cycle. Diastolic phases were
analyzed on a dedicated work station using MPR, MIP and VRT modes.
The patient received 80 cc of contrast.

Aorta:  Normal size.  No calcifications.  No dissection.

Aortic Valve:  Trileaflet.  No calcifications.

Coronary Arteries:  Normal coronary origin.  Right dominance.
Left main is a large artery with no plaque. LAD is a medium caliber
vessel that has no plaque. Mid LAD divides into three smaller
branches.

LCX artery is a medium size artery with no plaque. OM1 has no
plaque.

RCA is a large caliber dominant vessel that gives rise to PDA and
PLA, there is no plaque.

There is normal venous drainage of pulmonary veins into the left
atrium. There are three pulmonary veins, a left common pulmonary
vein divides into LUPV and LLPV. The measurements are as follows:

RUPV:  17 x 17 mm

RLPV:  16 x 15 mm

Left common pulmonary vein:  30 x 19 mm

LUPV:  17 x 16 mm

LLPV:  16 x 15 mm

There is a large left atrial appendage in a shape of chicken wing
with one major lobe. The ostial measurements are 27 x 18 mm, the
length is 30 mm. There is no filling defect in the left atrial
appendage.

The esophagus runs in the proximity to the left common pulmonary
vein ostium.
IMPRESSION: 1. Normal coronary origin.  Right dominance.  No evidence of CAD.

2. There is normal venous drainage of pulmonary veins into the left
atrium. There are three pulmonary veins, a left common pulmonary
vein divides into LUPV and LLPV.

3. There is a large left atrial appendage in a shape of chicken wing
with one major lobe and no evidence of a thrombus.

4. The esophagus runs in the proximity to the left common pulmonary
vein ostium.

[place]

EXAM:
OVER-READ INTERPRETATION  CT CHEST

The following report is an over-read performed by radiologist Dr.
over-read does not include interpretation of cardiac or coronary
anatomy or pathology. The coronary CTA interpretation by the
cardiologist is attached.
FINDINGS: Mediastinum/Nodes: Unremarkable

Lungs/Pleura: Airway thickening is present, suggesting bronchitis or
reactive airways disease.

Upper abdomen: Unremarkable

Musculoskeletal: Unremarkable
IMPRESSION: 1. Airway thickening is present, suggesting bronchitis or reactive
airways disease.

## 2017-06-04 ENCOUNTER — Ambulatory Visit: Payer: BLUE CROSS/BLUE SHIELD | Admitting: Physical Therapy

## 2017-06-04 DIAGNOSIS — M25561 Pain in right knee: Secondary | ICD-10-CM

## 2017-06-04 DIAGNOSIS — R600 Localized edema: Secondary | ICD-10-CM | POA: Diagnosis not present

## 2017-06-04 DIAGNOSIS — M25661 Stiffness of right knee, not elsewhere classified: Secondary | ICD-10-CM | POA: Diagnosis not present

## 2017-06-04 LAB — CBC WITH DIFFERENTIAL/PLATELET
BASOS ABS: 0 10*3/uL (ref 0.0–0.2)
Basos: 0 %
EOS (ABSOLUTE): 0 10*3/uL (ref 0.0–0.4)
EOS: 0 %
HEMATOCRIT: 42.4 % (ref 37.5–51.0)
Hemoglobin: 14.3 g/dL (ref 13.0–17.7)
IMMATURE GRANULOCYTES: 0 %
Immature Grans (Abs): 0 10*3/uL (ref 0.0–0.1)
LYMPHS ABS: 0.7 10*3/uL (ref 0.7–3.1)
Lymphs: 8 %
MCH: 29.1 pg (ref 26.6–33.0)
MCHC: 33.7 g/dL (ref 31.5–35.7)
MCV: 86 fL (ref 79–97)
MONOCYTES: 2 %
Monocytes Absolute: 0.1 10*3/uL (ref 0.1–0.9)
NEUTROS PCT: 90 %
Neutrophils Absolute: 8 10*3/uL — ABNORMAL HIGH (ref 1.4–7.0)
PLATELETS: 252 10*3/uL (ref 150–379)
RBC: 4.92 x10E6/uL (ref 4.14–5.80)
RDW: 13.9 % (ref 12.3–15.4)
WBC: 8.9 10*3/uL (ref 3.4–10.8)

## 2017-06-04 LAB — BMP8+EGFR
BUN / CREAT RATIO: 17 (ref 10–24)
BUN: 19 mg/dL (ref 8–27)
CHLORIDE: 95 mmol/L — AB (ref 96–106)
CO2: 26 mmol/L (ref 20–29)
Calcium: 9.5 mg/dL (ref 8.6–10.2)
Creatinine, Ser: 1.13 mg/dL (ref 0.76–1.27)
GFR calc Af Amer: 80 mL/min/{1.73_m2} (ref >59)
GFR calc non Af Amer: 69 mL/min/{1.73_m2} (ref >59)
GLUCOSE: 180 mg/dL — AB (ref 65–99)
Potassium: 3.8 mmol/L (ref 3.5–5.2)
Sodium: 137 mmol/L (ref 134–144)

## 2017-06-04 NOTE — Therapy (Signed)
[facility] [place], [place], [address] Phone: [phone]   Fax:  [phone]  Physical Therapy Treatment  Patient Details  Name: Troy Acosta MRN: [id number] Date of Birth: Nov 22, 1950 Referring Provider: Esmond Plants, MD  Encounter Date: 06/04/2017    Past Medical History:  Diagnosis Date  . Anxiety   . Arteritis, unspecified ([place])   . Arthritis    hands, knees, feet  . Diaphragmatic hernia without mention of obstruction or gangrene   . Dysrhythmia    afib, ablation- 12/2015, no problems since, off blood thinner- 09/2016  . Essential hypertension, benign   . Headache    had MRI- was consulted with Dr. Jannifer Franklin, but the consult, headache has resolved.  . Hyperplasia of prostate   . Iron deficiency anemia, unspecified   . Malaise and fatigue   . Obesity   . Other and unspecified hyperlipidemia   . Paroxysmal atrial fibrillation (HCC)   . Unspecified hypothyroidism     Past Surgical History:  Procedure Laterality Date  . ELECTROPHYSIOLOGIC STUDY N/A 12/24/2015   Afib ablation by Dr Rayann Heman  . KNEE SURGERY Right   . TOTAL KNEE ARTHROPLASTY Right 03/02/2017   Procedure: RIGHT TOTAL KNEE ARTHROPLASTY;  Surgeon: [name], MD;  Location: [facility];  Service: Orthopedics;  Laterality: Right;    There were no vitals filed for this visit.      Subjective Assessment - 06/04/17 1511    Subjective I'm gonna get my other knee done    Pain Score 4    Pain Location Knee   Pain Orientation Right   Pain Descriptors / Indicators Discomfort   Pain Type Surgical pain   Pain Onset More than a month ago   Pain Frequency Constant                         [facility] Adult PT Treatment/Exercise - 06/04/17 0001      Knee/Hip Exercises: Aerobic   Stationary Bike Level 5 x 15 minutes.     Knee/Hip Exercises: Machines for Strengthening   Cybex Knee Extension 20# x 4 minutes.   Cybex Knee Flexion 50# x 4 minutes.    Cybex Leg Press 3 plates x 2 minutes.     Vasopneumatic   Number Minutes Vasopneumatic  15 minutes   Vasopnuematic Location  --  Right knee.   Vasopneumatic Pressure Medium     Manual Therapy   Manual Therapy Passive ROM   Passive ROM PROM into right knee x 4 minutes.                  PT Short Term Goals - 04/13/17 0810      PT SHORT TERM GOAL #1   Title pt will be independent with his initial HEP   Time 3   Period Weeks   Status Achieved     PT SHORT TERM GOAL #2   Title Pt will improve right knee flexion to >100 degrees actively in order to improve amb.   Time 3   Period Weeks   Status Achieved  AROM R knee flexion 107 deg 04/13/2017           PT Long Term Goals - 05/14/17 0808      PT LONG TERM GOAL #1   Title Pt will improve his FOTO score from 61% limitation to </= 40% limitation.   Time 8   Period Weeks   Status Achieved  FOTO score 32%  current limitation 04/13/2017     PT LONG TERM GOAL #2   Title Pt will be able to amb with no assistive device >/= 1000 feet on community surfaces.   Time 8   Period Weeks   Status Achieved  No AD used per patient report 04/20/2017     PT LONG TERM GOAL #3   Title Pt will improve his R LE knee flexion and extension to 5/5 in order to improve function.   Time 8   Period Weeks   Status Achieved  R knee MMT 5/5 05/14/2017     PT LONG TERM GOAL #4   Title Pt will improve R knee flexion to >/= 125 degrees in order to improve functional mobility.    Time 8   Period Weeks   Status On-going  AROM R knee flexion 120 deg 05/14/2017     PT LONG TERM GOAL #5   Title Pt will improve R knee extension to </= 5 degrees of flexion in order to improve gait.    Time 8   Period Weeks   Status On-going  -8 deg extension R knee 05/14/2017     PT LONG TERM GOAL #6   Title Pt will be albe to improve R knee pain </= 3/10 with amb and ADL's.    Time 8   Period Weeks   Status On-going               Plan - 06/04/17 1517     Clinical Impression Statement Excellent job today       Patient will benefit from skilled therapeutic intervention in order to improve the following deficits and impairments:  Abnormal gait, Pain, Increased edema, Decreased activity tolerance, Decreased mobility, Difficulty walking, Decreased strength, Decreased range of motion  Visit Diagnosis: Stiffness of right knee, not elsewhere classified  Acute pain of right knee  Localized edema     Problem List Patient Active Problem List   Diagnosis Date Noted  . Status post total knee replacement, right 03/02/2017  . Chronic intractable headache 08/09/2016  . Other specified hypothyroidism 03/02/2016  . A-fib ([place]) 12/24/2015  . Non-ischemic cardiomyopathy ([place]) 11/07/2015  . Long term (current) use of anticoagulants 11/07/2015  . Diaphoresis 11/01/2015  . Chest pain 11/01/2015  . PAF (paroxysmal atrial fibrillation) ([place]) 07/15/2015  . Heart palpitations 07/11/2015  . Dyspnea 02/18/2015  . Anemia, iron deficiency 09/10/2013  . Diaphragmatic hernia without mention of obstruction or gangrene   . Hypothyroidism   . Malaise and fatigue   . Arteritis, unspecified ([place])   . Iron deficiency anemia, unspecified   . Obesity   . BPH (benign prostatic hyperplasia)   . Essential hypertension, benign   . Hyperlipidemia     Lemmie Steinhaus, [place] MPT 06/04/2017, 3:27 PM  [facility] [address] [place], [place], [address] Phone: [phone]   Fax:  [phone]  Name: Bradely Rudin MRN: [id number] Date of Birth: [birth year]

## 2017-06-05 LAB — SPECIMEN STATUS REPORT

## 2017-06-05 LAB — HGB A1C W/O EAG: Hgb A1c MFr Bld: 5.5 % (ref 4.8–5.6)

## 2017-06-06 ENCOUNTER — Telehealth: Payer: Self-pay | Admitting: Family Medicine

## 2017-06-06 NOTE — Telephone Encounter (Signed)
Aware. 

## 2017-06-07 ENCOUNTER — Ambulatory Visit: Payer: BLUE CROSS/BLUE SHIELD | Admitting: Physical Therapy

## 2017-06-07 ENCOUNTER — Encounter: Payer: Self-pay | Admitting: Physical Therapy

## 2017-06-07 DIAGNOSIS — M25561 Pain in right knee: Secondary | ICD-10-CM

## 2017-06-07 DIAGNOSIS — M25661 Stiffness of right knee, not elsewhere classified: Secondary | ICD-10-CM

## 2017-06-07 DIAGNOSIS — R600 Localized edema: Secondary | ICD-10-CM | POA: Diagnosis not present

## 2017-06-07 NOTE — Therapy (Signed)
[facility] [place], [place], [address] Phone: [phone]   Fax:  [phone]  Physical Therapy Treatment  Patient Details  Name: Troy Acosta MRN: [id number] Date of Birth: [birth year] Referring Provider: Esmond Plants, MD  Encounter Date: 06/07/2017      PT End of Session - 06/07/17 1439    Visit Number 28   Number of Visits 36   Date for PT Re-Evaluation 06/18/17   PT Start Time 9983   PT Stop Time 1445   PT Time Calculation (min) 58 min   Activity Tolerance Patient tolerated treatment well   Behavior During Therapy [facility] for tasks assessed/performed      Past Medical History:  Diagnosis Date  . Anxiety   . Arteritis, unspecified ([place])   . Arthritis    hands, knees, feet  . Diaphragmatic hernia without mention of obstruction or gangrene   . Dysrhythmia    afib, ablation- 12/2015, no problems since, off blood thinner- 09/2016  . Essential hypertension, benign   . Headache    had MRI- was consulted with Dr. Jannifer Franklin, but the consult, headache has resolved.  . Hyperplasia of prostate   . Iron deficiency anemia, unspecified   . Malaise and fatigue   . Obesity   . Other and unspecified hyperlipidemia   . Paroxysmal atrial fibrillation (HCC)   . Unspecified hypothyroidism     Past Surgical History:  Procedure Laterality Date  . ELECTROPHYSIOLOGIC STUDY N/A 12/24/2015   Afib ablation by Dr Rayann Heman  . KNEE SURGERY Right   . TOTAL KNEE ARTHROPLASTY Right 03/02/2017   Procedure: RIGHT TOTAL KNEE ARTHROPLASTY;  Surgeon: [name], MD;  Location: [place];  Service: Orthopedics;  Laterality: Right;    There were no vitals filed for this visit.      Subjective Assessment - 06/07/17 1402    Subjective Reports that his knee has still been bothering her after trying to walk 1mi without stopping.   Limitations Walking   How long can you sit comfortably? 2 hours   How long can you stand comfortably? 2 hours   How long  can you walk comfortably? 30 mins   Diagnostic tests X-ray    Patient Stated Goals 2hrs   Currently in Pain? Yes   Pain Score 2    Pain Location Knee   Pain Orientation Right   Pain Descriptors / Indicators Discomfort   Pain Type Surgical pain   Pain Onset More than a month ago            [facility] PT Assessment - 06/07/17 0001      Assessment   Medical Diagnosis Right TKA   Onset Date/Surgical Date 03/02/17   Hand Dominance Right   Next MD Visit 08/2017   Prior Therapy yes, 5 HHPT visits following surgery     Precautions   Precautions None     Restrictions   Weight Bearing Restrictions No     ROM / Strength   AROM / PROM / Strength AROM     AROM   Overall AROM  Deficits   AROM Assessment Site Knee   Right/Left Knee Right   Right Knee Extension -7                     [facility] Adult PT Treatment/Exercise - 06/07/17 0001      Knee/Hip Exercises: Aerobic   Stationary Bike x15 min     Knee/Hip Exercises: Machines for Strengthening   Cybex Knee  Extension 20# x15 reps, 30# x15 reps   Cybex Knee Flexion 50# 3x10 reps   Cybex Leg Press 3pl, seat 7, 3x10 reps     Knee/Hip Exercises: Standing   Terminal Knee Extension Limitations Orange XTS x30 reps with 5 sec hold   Step Down Right;3 sets;10 reps;Hand Hold: 2;Step Height: 4"  Heel dot     Modalities   Modalities Electrical Stimulation;Vasopneumatic     Electrical Stimulation   Electrical Stimulation Location R knee   Electrical Stimulation Action IFC   Electrical Stimulation Parameters 1-10 hz x15 min   Electrical Stimulation Goals Pain;Edema     Vasopneumatic   Number Minutes Vasopneumatic  15 minutes   Vasopnuematic Location  Knee   Vasopneumatic Pressure Medium   Vasopneumatic Temperature  34     Manual Therapy   Manual Therapy Passive ROM   Passive ROM PROM of R knee into extension with holds at end range                  PT Short Term Goals - 04/13/17 0810      PT SHORT TERM  GOAL #1   Title pt will be independent with his initial HEP   Time 3   Period Weeks   Status Achieved     PT SHORT TERM GOAL #2   Title Pt will improve right knee flexion to >100 degrees actively in order to improve amb.   Time 3   Period Weeks   Status Achieved  AROM R knee flexion 107 deg 04/13/2017           PT Long Term Goals - 05/14/17 0808      PT LONG TERM GOAL #1   Title Pt will improve his FOTO score from 61% limitation to </= 40% limitation.   Time 8   Period Weeks   Status Achieved  FOTO score 32% current limitation 04/13/2017     PT LONG TERM GOAL #2   Title Pt will be able to amb with no assistive device >/= 1000 feet on community surfaces.   Time 8   Period Weeks   Status Achieved  No AD used per patient report 04/20/2017     PT LONG TERM GOAL #3   Title Pt will improve his R LE knee flexion and extension to 5/5 in order to improve function.   Time 8   Period Weeks   Status Achieved  R knee MMT 5/5 05/14/2017     PT LONG TERM GOAL #4   Title Pt will improve R knee flexion to >/= 125 degrees in order to improve functional mobility.    Time 8   Period Weeks   Status On-going  AROM R knee flexion 120 deg 05/14/2017     PT LONG TERM GOAL #5   Title Pt will improve R knee extension to </= 5 degrees of flexion in order to improve gait.    Time 8   Period Weeks   Status On-going  -8 deg extension R knee 05/14/2017     PT LONG TERM GOAL #6   Title Pt will be albe to improve R knee pain </= 3/10 with amb and ADL's.    Time 8   Period Weeks   Status On-going               Plan - 06/07/17 1442    Clinical Impression Statement Patient tolerated today's treatment well although he still experiences pain with activity and prolonged ambulation.  Patient able to complete exercises although lack of full ROM visible during the exercises. AROM of R knee extension still lacking 7 deg from full extension. Normal modalities response noted following removal of the  modaliteis.   Rehab Potential Excellent   PT Frequency 3x / week   PT Duration 8 weeks   PT Treatment/Interventions ADLs/Self Care Home Management;Gait training;Patient/family education;Taping;Vasopneumatic Device;Cryotherapy;Electrical Stimulation;Moist Heat;Ultrasound;Therapeutic exercise;Therapeutic activities;Functional mobility training;Stair training;Manual techniques;Passive range of motion   PT Next Visit Plan Continue with R knee ROM focus with modalities and PRN per MPT POC   PT Home Exercise Plan Quad sets, hamstring stretching, (HEP provided by HHPT with standing hip exercises)   Consulted and Agree with Plan of Care Patient      Patient will benefit from skilled therapeutic intervention in order to improve the following deficits and impairments:  Abnormal gait, Pain, Increased edema, Decreased activity tolerance, Decreased mobility, Difficulty walking, Decreased strength, Decreased range of motion  Visit Diagnosis: Stiffness of right knee, not elsewhere classified  Acute pain of right knee  Localized edema     Problem List Patient Active Problem List   Diagnosis Date Noted  . Status post total knee replacement, right 03/02/2017  . Chronic intractable headache 08/09/2016  . Other specified hypothyroidism 03/02/2016  . A-fib ([place]) 12/24/2015  . Non-ischemic cardiomyopathy ([place]) 11/07/2015  . Long term (current) use of anticoagulants 11/07/2015  . Diaphoresis 11/01/2015  . Chest pain 11/01/2015  . PAF (paroxysmal atrial fibrillation) ([place]) 07/15/2015  . Heart palpitations 07/11/2015  . Dyspnea 02/18/2015  . Anemia, iron deficiency 09/10/2013  . Diaphragmatic hernia without mention of obstruction or gangrene   . Hypothyroidism   . Malaise and fatigue   . Arteritis, unspecified ([place])   . Iron deficiency anemia, unspecified   . Obesity   . BPH (benign prostatic hyperplasia)   . Essential hypertension, benign   . Hyperlipidemia     Wynelle Fanny,  PTA 06/07/2017, 2:58 PM  [facility] [address] [place], [place], [address] Phone: [phone]   Fax:  [phone]  Name: Troy Acosta MRN: [id number] Date of Birth: 1951/09/27

## 2017-06-12 ENCOUNTER — Ambulatory Visit: Payer: BLUE CROSS/BLUE SHIELD | Attending: Orthopedic Surgery | Admitting: *Deleted

## 2017-06-12 DIAGNOSIS — M25561 Pain in right knee: Secondary | ICD-10-CM | POA: Diagnosis not present

## 2017-06-12 DIAGNOSIS — R600 Localized edema: Secondary | ICD-10-CM | POA: Insufficient documentation

## 2017-06-12 DIAGNOSIS — M25661 Stiffness of right knee, not elsewhere classified: Secondary | ICD-10-CM | POA: Diagnosis not present

## 2017-06-14 ENCOUNTER — Encounter: Payer: Self-pay | Admitting: Physical Therapy

## 2017-06-14 ENCOUNTER — Ambulatory Visit: Payer: BLUE CROSS/BLUE SHIELD | Admitting: Physical Therapy

## 2017-06-14 DIAGNOSIS — M25561 Pain in right knee: Secondary | ICD-10-CM | POA: Diagnosis not present

## 2017-06-14 DIAGNOSIS — R600 Localized edema: Secondary | ICD-10-CM

## 2017-06-14 DIAGNOSIS — M25661 Stiffness of right knee, not elsewhere classified: Secondary | ICD-10-CM

## 2017-06-14 NOTE — Therapy (Addendum)
[facility] [place], [place], [address] Phone: [phone]   Fax:  [phone]  Physical Therapy Treatment  Patient Details  Name: Troy Acosta MRN: [id number] Date of Birth: 1951/07/13 Referring Provider: Esmond Plants, MD  Encounter Date: 06/14/2017      PT End of Session - 06/14/17 1502    Visit Number 30   Number of Visits 36   Date for PT Re-Evaluation 06/18/17   PT Start Time 1430   PT Stop Time 6195   PT Time Calculation (min) 44 min   Activity Tolerance Patient tolerated treatment well   Behavior During Therapy [facility] for tasks assessed/performed      Past Medical History:  Diagnosis Date  . Anxiety   . Arteritis, unspecified ([place])   . Arthritis    hands, knees, feet  . Diaphragmatic hernia without mention of obstruction or gangrene   . Dysrhythmia    afib, ablation- 12/2015, no problems since, off blood thinner- 09/2016  . Essential hypertension, benign   . Headache    had MRI- was consulted with Dr. Jannifer Franklin, but the consult, headache has resolved.  . Hyperplasia of prostate   . Iron deficiency anemia, unspecified   . Malaise and fatigue   . Obesity   . Other and unspecified hyperlipidemia   . Paroxysmal atrial fibrillation (HCC)   . Unspecified hypothyroidism     Past Surgical History:  Procedure Laterality Date  . ELECTROPHYSIOLOGIC STUDY N/A 12/24/2015   Afib ablation by Dr Rayann Heman  . KNEE SURGERY Right   . TOTAL KNEE ARTHROPLASTY Right 03/02/2017   Procedure: RIGHT TOTAL KNEE ARTHROPLASTY;  Surgeon: [name], MD;  Location: [facility];  Service: Orthopedics;  Laterality: Right;    There were no vitals filed for this visit.      Subjective Assessment - 06/14/17 1434    Subjective Patient reported no decreased pain and more pain since he has been back to work   Limitations Walking   How long can you sit comfortably? 2 hours   How long can you stand comfortably? 2 hours   How long can you walk  comfortably? 30 mins   Diagnostic tests X-ray    Patient Stated Goals 2hrs   Currently in Pain? Yes   Pain Score 2    Pain Location Knee   Pain Orientation Right   Pain Descriptors / Indicators Discomfort   Pain Type Surgical pain   Pain Onset More than a month ago   Pain Frequency Constant   Aggravating Factors  prolong standing or work   Pain Relieving Factors meds and rest            [facility] PT Assessment - 06/14/17 0001      AROM   AROM Assessment Site Knee   Right/Left Knee Right   Right Knee Extension -10   Right Knee Flexion 95     PROM   PROM Assessment Site Knee   Right/Left Knee Right   Right Knee Extension -6   Right Knee Flexion 100                     [facility] Adult PT Treatment/Exercise - 06/14/17 0001      Knee/Hip Exercises: Aerobic   Stationary Bike x15 min     Knee/Hip Exercises: Machines for Strengthening   Cybex Knee Flexion 50# 3x10 reps     Knee/Hip Exercises: Standing   Terminal Knee Extension Limitations pink XTS x30  Acupuncturist Location R knee IFC x 15 mins 1-10 hz   Electrical Stimulation Goals Pain;Edema     Vasopneumatic   Number Minutes Vasopneumatic  15 minutes   Vasopnuematic Location  Knee   Vasopneumatic Pressure Medium     Manual Therapy   Manual Therapy Passive ROM   Passive ROM PROM of R knee into extension with holds at end range                  PT Short Term Goals - 04/13/17 0810      PT SHORT TERM GOAL #1   Title pt will be independent with his initial HEP   Time 3   Period Weeks   Status Achieved     PT SHORT TERM GOAL #2   Title Pt will improve right knee flexion to >100 degrees actively in order to improve amb.   Time 3   Period Weeks   Status Achieved  AROM R knee flexion 107 deg 04/13/2017           PT Long Term Goals - 05/14/17 0808      PT LONG TERM GOAL #1   Title Pt will improve his FOTO score from 61% limitation to </= 40%  limitation.   Time 8   Period Weeks   Status Achieved  FOTO score 32% current limitation 04/13/2017     PT LONG TERM GOAL #2   Title Pt will be able to amb with no assistive device >/= 1000 feet on community surfaces.   Time 8   Period Weeks   Status Achieved  No AD used per patient report 04/20/2017     PT LONG TERM GOAL #3   Title Pt will improve his R LE knee flexion and extension to 5/5 in order to improve function.   Time 8   Period Weeks   Status Achieved  R knee MMT 5/5 05/14/2017     PT LONG TERM GOAL #4   Title Pt will improve R knee flexion to >/= 125 degrees in order to improve functional mobility.    Time 8   Period Weeks   Status On-going  AROM R knee flexion 120 deg 05/14/2017     PT LONG TERM GOAL #5   Title Pt will improve R knee extension to </= 5 degrees of flexion in order to improve gait.    Time 8   Period Weeks   Status On-going  -8 deg extension R knee 05/14/2017     PT LONG TERM GOAL #6   Title Pt will be albe to improve R knee pain </= 3/10 with amb and ADL's.    Time 8   Period Weeks   Status On-going               Plan - 06/14/17 1503    Clinical Impression Statement Patient tolerated treatment well today. Today focused on less exercises on weight machines to avoid stress on knee. Patient has been back to work and increasing hours each week. Patint will be on hold next week to determine if he will DC or return to therapy. Educated patient on self ROM and gentle strength exercises to avoid doing too much with work activities. Patient reported doing too much at times. Patient unable to meet ROM goals currently and ongoing pain deficits.    Rehab Potential Excellent   PT Frequency 3x / week   PT Duration 8 weeks   PT Treatment/Interventions  ADLs/Self Care Home Management;Gait training;Patient/family education;Taping;Vasopneumatic Device;Cryotherapy;Electrical Stimulation;Moist Heat;Ultrasound;Therapeutic exercise;Therapeutic activities;Functional  mobility training;Stair training;Manual techniques;Passive range of motion   PT Next Visit Plan on hold next week to determine DC or cont therapy per patient    Consulted and Agree with Plan of Care Patient      Patient will benefit from skilled therapeutic intervention in order to improve the following deficits and impairments:  Abnormal gait, Pain, Increased edema, Decreased activity tolerance, Decreased mobility, Difficulty walking, Decreased strength, Decreased range of motion  Visit Diagnosis: Stiffness of right knee, not elsewhere classified  Acute pain of right knee  Localized edema     Problem List Patient Active Problem List   Diagnosis Date Noted  . Status post total knee replacement, right 03/02/2017  . Chronic intractable headache 08/09/2016  . Other specified hypothyroidism 03/02/2016  . A-fib ([place]) 12/24/2015  . Non-ischemic cardiomyopathy ([place]) 11/07/2015  . Long term (current) use of anticoagulants 11/07/2015  . Diaphoresis 11/01/2015  . Chest pain 11/01/2015  . PAF (paroxysmal atrial fibrillation) ([place]) 07/15/2015  . Heart palpitations 07/11/2015  . Dyspnea 02/18/2015  . Anemia, iron deficiency 09/10/2013  . Diaphragmatic hernia without mention of obstruction or gangrene   . Hypothyroidism   . Malaise and fatigue   . Arteritis, unspecified ([place])   . Iron deficiency anemia, unspecified   . Obesity   . BPH (benign prostatic hyperplasia)   . Essential hypertension, benign   . Hyperlipidemia     DUNFORD, CHRISTINA P, PTA 06/14/2017, 3:19 PM  [facility] [place], [place], [address] Phone: [phone]   Fax:  [phone]  Name: Garritt Molyneux MRN: [id number] Date of Birth: 01/03/1951  PHYSICAL THERAPY DISCHARGE SUMMARY  Visits from Start of Care: 30.  Current functional level related to goals / functional outcomes: See above.   Remaining deficits: See goal section.   Education /  Equipment: HEP. Plan: Patient agrees to discharge.  Patient goals were partially met. Patient is being discharged due to being pleased with the current functional level.  ?????         Mali Applegate MPT

## 2017-06-18 ENCOUNTER — Encounter: Payer: BLUE CROSS/BLUE SHIELD | Admitting: Physical Therapy

## 2017-06-18 DIAGNOSIS — E039 Hypothyroidism, unspecified: Secondary | ICD-10-CM | POA: Diagnosis not present

## 2017-06-18 DIAGNOSIS — Z719 Counseling, unspecified: Secondary | ICD-10-CM | POA: Diagnosis not present

## 2017-06-18 DIAGNOSIS — D509 Iron deficiency anemia, unspecified: Secondary | ICD-10-CM | POA: Diagnosis not present

## 2017-06-18 DIAGNOSIS — E782 Mixed hyperlipidemia: Secondary | ICD-10-CM | POA: Diagnosis not present

## 2017-06-18 DIAGNOSIS — Z008 Encounter for other general examination: Secondary | ICD-10-CM | POA: Diagnosis not present

## 2017-06-19 ENCOUNTER — Ambulatory Visit: Payer: BLUE CROSS/BLUE SHIELD | Admitting: Family Medicine

## 2017-06-19 NOTE — Therapy (Signed)
[facility] [place], [place], [address] Phone: [phone]   Fax:  [phone]  Physical Therapy Treatment  Patient Details  Name: Troy Acosta MRN: [id number] Date of Birth: 1951-06-28 Referring Provider: Esmond Plants, MD  Encounter Date: 06/12/2017    Past Medical History:  Diagnosis Date  . Anxiety   . Arteritis, unspecified ([place])   . Arthritis    hands, knees, feet  . Diaphragmatic hernia without mention of obstruction or gangrene   . Dysrhythmia    afib, ablation- 12/2015, no problems since, off blood thinner- 09/2016  . Essential hypertension, benign   . Headache    had MRI- was consulted with Dr. Jannifer Franklin, but the consult, headache has resolved.  . Hyperplasia of prostate   . Iron deficiency anemia, unspecified   . Malaise and fatigue   . Obesity   . Other and unspecified hyperlipidemia   . Paroxysmal atrial fibrillation (HCC)   . Unspecified hypothyroidism     Past Surgical History:  Procedure Laterality Date  . ELECTROPHYSIOLOGIC STUDY N/A 12/24/2015   Afib ablation by Dr Rayann Heman  . KNEE SURGERY Right   . TOTAL KNEE ARTHROPLASTY Right 03/02/2017   Procedure: RIGHT TOTAL KNEE ARTHROPLASTY;  Surgeon: [name], MD;  Location: [facility];  Service: Orthopedics;  Laterality: Right;    There were no vitals filed for this visit.  see flow sheet for RX                               PT Short Term Goals - 04/13/17 0810      PT SHORT TERM GOAL #1   Title pt will be independent with his initial HEP   Time 3   Period Weeks   Status Achieved     PT SHORT TERM GOAL #2   Title Pt will improve right knee flexion to >100 degrees actively in order to improve amb.   Time 3   Period Weeks   Status Achieved  AROM R knee flexion 107 deg 04/13/2017           PT Long Term Goals - 05/14/17 0808      PT LONG TERM GOAL #1   Title Pt will improve his FOTO score from 61% limitation to </= 40%  limitation.   Time 8   Period Weeks   Status Achieved  FOTO score 32% current limitation 04/13/2017     PT LONG TERM GOAL #2   Title Pt will be able to amb with no assistive device >/= 1000 feet on community surfaces.   Time 8   Period Weeks   Status Achieved  No AD used per patient report 04/20/2017     PT LONG TERM GOAL #3   Title Pt will improve his R LE knee flexion and extension to 5/5 in order to improve function.   Time 8   Period Weeks   Status Achieved  R knee MMT 5/5 05/14/2017     PT LONG TERM GOAL #4   Title Pt will improve R knee flexion to >/= 125 degrees in order to improve functional mobility.    Time 8   Period Weeks   Status On-going  AROM R knee flexion 120 deg 05/14/2017     PT LONG TERM GOAL #5   Title Pt will improve R knee extension to </= 5 degrees of flexion in order to improve gait.    Time 8  Period Weeks   Status On-going  -8 deg extension R knee 05/14/2017     PT LONG TERM GOAL #6   Title Pt will be albe to improve R knee pain </= 3/10 with amb and ADL's.    Time 8   Period Weeks   Status On-going             Patient will benefit from skilled therapeutic intervention in order to improve the following deficits and impairments:  Abnormal gait, Pain, Increased edema, Decreased activity tolerance, Decreased mobility, Difficulty walking, Decreased strength, Decreased range of motion  Visit Diagnosis: Stiffness of right knee, not elsewhere classified  Acute pain of right knee  Localized edema     Problem List Patient Active Problem List   Diagnosis Date Noted  . Status post total knee replacement, right 03/02/2017  . Chronic intractable headache 08/09/2016  . Other specified hypothyroidism 03/02/2016  . A-fib ([place]) 12/24/2015  . Non-ischemic cardiomyopathy ([place]) 11/07/2015  . Long term (current) use of anticoagulants 11/07/2015  . Diaphoresis 11/01/2015  . Chest pain 11/01/2015  . PAF (paroxysmal atrial fibrillation) ([place])  07/15/2015  . Heart palpitations 07/11/2015  . Dyspnea 02/18/2015  . Anemia, iron deficiency 09/10/2013  . Diaphragmatic hernia without mention of obstruction or gangrene   . Hypothyroidism   . Malaise and fatigue   . Arteritis, unspecified ([place])   . Iron deficiency anemia, unspecified   . Obesity   . BPH (benign prostatic hyperplasia)   . Essential hypertension, benign   . Hyperlipidemia     Chidubem Chaires,CHRIS, PTA 06/19/2017, 6:08 PM  [facility] [address] [place], [place], [address] Phone: [phone]   Fax:  [phone]  Name: Cristhian Vanhook MRN: [id number] Date of Birth: Feb 06, 1951

## 2017-06-21 ENCOUNTER — Encounter: Payer: BLUE CROSS/BLUE SHIELD | Admitting: *Deleted

## 2017-06-21 ENCOUNTER — Telehealth: Payer: Self-pay | Admitting: Internal Medicine

## 2017-06-21 NOTE — Telephone Encounter (Signed)
[place] requests clearance for: 1. Type of surgery: left knee: TKR 2. Date of surgery: 08/24/2017 3. Surgeon: Dr. Esmond Plants 4. Medications that need to be held & how long: none specified - on ASA 81mg  5. Fax and/or Phone: (p) [phone]  (f) [phone]  Attn: Santiago Bur  ** patient has been routinely seeing Dr. [name], has not seen Dr. Debara Pickett since March 2017

## 2017-06-25 ENCOUNTER — Encounter: Payer: BLUE CROSS/BLUE SHIELD | Admitting: Physical Therapy

## 2017-06-25 NOTE — Telephone Encounter (Signed)
Accpetable risk for surgery. Hold aspirin 7 days prior to surgery and resume 24 hours after, if necessary.  Dr. Lemmie Evens

## 2017-06-26 NOTE — Telephone Encounter (Signed)
Routed clearance via EPIC

## 2017-06-28 ENCOUNTER — Encounter: Payer: BLUE CROSS/BLUE SHIELD | Admitting: Physical Therapy

## 2017-07-02 DIAGNOSIS — E782 Mixed hyperlipidemia: Secondary | ICD-10-CM | POA: Diagnosis not present

## 2017-07-02 DIAGNOSIS — D509 Iron deficiency anemia, unspecified: Secondary | ICD-10-CM | POA: Diagnosis not present

## 2017-07-02 DIAGNOSIS — I10 Essential (primary) hypertension: Secondary | ICD-10-CM | POA: Diagnosis not present

## 2017-07-02 DIAGNOSIS — E039 Hypothyroidism, unspecified: Secondary | ICD-10-CM | POA: Diagnosis not present

## 2017-07-11 ENCOUNTER — Other Ambulatory Visit: Payer: BLUE CROSS/BLUE SHIELD

## 2017-07-11 DIAGNOSIS — E034 Atrophy of thyroid (acquired): Secondary | ICD-10-CM

## 2017-07-12 LAB — THYROID PANEL WITH TSH
Free Thyroxine Index: 2.5 (ref 1.2–4.9)
T3 Uptake Ratio: 28 % (ref 24–39)
T4, Total: 9 ug/dL (ref 4.5–12.0)
TSH: 0.542 u[IU]/mL (ref 0.450–4.500)

## 2017-07-17 DIAGNOSIS — G8929 Other chronic pain: Secondary | ICD-10-CM | POA: Diagnosis not present

## 2017-07-17 DIAGNOSIS — Z471 Aftercare following joint replacement surgery: Secondary | ICD-10-CM | POA: Diagnosis not present

## 2017-07-17 DIAGNOSIS — M25561 Pain in right knee: Secondary | ICD-10-CM | POA: Diagnosis not present

## 2017-07-17 DIAGNOSIS — Z96651 Presence of right artificial knee joint: Secondary | ICD-10-CM | POA: Diagnosis not present

## 2017-07-26 DIAGNOSIS — M779 Enthesopathy, unspecified: Secondary | ICD-10-CM | POA: Diagnosis not present

## 2017-07-26 DIAGNOSIS — M79671 Pain in right foot: Secondary | ICD-10-CM | POA: Diagnosis not present

## 2017-08-09 NOTE — H&P (Signed)
Troy Acosta is an 66 y.o. male.    Chief Complaint: left knee pain  HPI: Pt is a 66 y.o. male complaining of left knee pain for multiple years. Pain had continually increased since the beginning. X-rays in the clinic show end-stage arthritic changes of the left knee. Pt has tried various conservative treatments which have failed to alleviate their symptoms, including injections and therapy. Various options are discussed with the patient. Risks, benefits and expectations were discussed with the patient. Patient understand the risks, benefits and expectations and wishes to proceed with surgery.   PCP:  Chipper Herb, MD  D/C Plans: Home  PMH: Past Medical History:  Diagnosis Date  . Anxiety   . Arteritis, unspecified ([place])   . Arthritis    hands, knees, feet  . Diaphragmatic hernia without mention of obstruction or gangrene   . Dysrhythmia    afib, ablation- 12/2015, no problems since, off blood thinner- 09/2016  . Essential hypertension, benign   . Headache    had MRI- was consulted with Dr. Jannifer Franklin, but the consult, headache has resolved.  . Hyperplasia of prostate   . Iron deficiency anemia, unspecified   . Malaise and fatigue   . Obesity   . Other and unspecified hyperlipidemia   . Paroxysmal atrial fibrillation (HCC)   . Unspecified hypothyroidism     PSH: Past Surgical History:  Procedure Laterality Date  . ELECTROPHYSIOLOGIC STUDY N/A 12/24/2015   Afib ablation by Dr Rayann Heman  . KNEE SURGERY Right   . TOTAL KNEE ARTHROPLASTY Right 03/02/2017   Procedure: RIGHT TOTAL KNEE ARTHROPLASTY;  Surgeon: [name], MD;  Location: [facility];  Service: Orthopedics;  Laterality: Right;    Social History:  reports that he quit smoking about 24 years ago. His smoking use included Cigarettes. He has never used smokeless tobacco. He reports that he does not drink alcohol or use drugs.  Allergies:  No Known Allergies  Medications: No current facility-administered medications for this  encounter.    Current Outpatient Prescriptions  Medication Sig Dispense Refill  . acetaminophen (TYLENOL) 500 MG tablet Take 500 mg by mouth 2 (two) times daily.    Marland Kitchen amLODipine (NORVASC) 5 MG tablet Take 1 tablet (5 mg total) by mouth daily. (Patient taking differently: Take 5 mg by mouth daily at 12 noon. ) 180 tablet 3  . aspirin (ASPIRIN CHILDRENS) 81 MG chewable tablet Chew 1 tablet (81 mg total) by mouth 2 (two) times daily. (Patient taking differently: Chew 81 mg by mouth at bedtime. ) 60 tablet 0  . Cholecalciferol 2000 units CAPS Take 2,000 Units by mouth every evening.    . hydrochlorothiazide (HYDRODIURIL) 25 MG tablet Take 1 tablet (25 mg total) by mouth daily. 90 tablet 3  . iron polysaccharides (FERREX 150) 150 MG capsule Take 1 capsule (150 mg total) by mouth daily. (Patient taking differently: Take 150 mg by mouth at bedtime. ) 90 capsule 3  . levothyroxine (SYNTHROID, LEVOTHROID) 150 MCG tablet Take 1 tablet (150 mcg total) by mouth daily before breakfast. (Patient taking differently: Take 150 mcg by mouth daily before breakfast. Takes 150 mcg  and 12.5 mcg to equal 162.5 on Monday -Wednesday-Friday and 150 mcg and 25 mcg to equal 175 mcg all other days of the week) 90 tablet 3  . levothyroxine (SYNTHROID, LEVOTHROID) 25 MCG tablet TAKE ONE-HALF TABLET BY MOUTH ONCE DAILY BEFORE BREAKFAST (Patient taking differently: Take 12.5 mcg by mouth daily before breakfast. Takes 150 mcg and 12.5 mcg to  equal 162.5 on Monday -Wednesday-Friday and 150 mcg and 25 mcg to equal 175 mcg all other days of the week) 45 tablet 3  . methocarbamol (ROBAXIN) 500 MG tablet Take 1 tablet (500 mg total) by mouth 3 (three) times daily as needed. (Patient taking differently: Take 500 mg by mouth 2 (two) times daily. ) 60 tablet 1  . olmesartan (BENICAR) 40 MG tablet Take 0.5 tablets (20 mg total) by mouth 2 (two) times daily. 180 tablet 3  . rosuvastatin (CRESTOR) 5 MG tablet Take 1 tablet (5 mg total) by mouth  daily. (Patient taking differently: Take 5 mg by mouth every evening. ) 90 tablet 3  . LORazepam (ATIVAN) 0.5 MG tablet TAKE ONE TABLET BY MOUTH ONCE DAILY AS NEEDED (Patient not taking: Reported on 08/03/2017) 30 tablet 2  . metoprolol tartrate (LOPRESSOR) 25 MG tablet Take 0.5 tablets (12.5 mg total) by mouth daily as needed (Elevated heart rate). (Patient not taking: Reported on 08/03/2017) 45 tablet 1   Facility-Administered Medications Ordered in Other Encounters  Medication Dose Route Frequency Provider Last Rate Last Dose  . gadopentetate dimeglumine (MAGNEVIST) injection 20 mL  20 mL Intravenous Once PRN Kathrynn Ducking, MD        No results found for this or any previous visit (from the past 48 hour(s)). No results found.  ROS: Pain with rom of the left lower extremity  Physical Exam:  Alert and oriented 66 y.o. male in no acute distress Cranial nerves 2-12 intact Cervical spine: full rom with no tenderness, nv intact distally Chest: active breath sounds bilaterally, no wheeze rhonchi or rales Heart: regular rate and rhythm, no murmur Abd: non tender non distended with active bowel sounds Hip is stable with rom  Left knee painful rom Crepitus with rom Antalgic gait nv intact distally  Assessment/Plan Assessment: left knee end stage osteoarthritis  Plan: Patient will undergo a left total knee by Dr. Veverly Fells at [facility]. Risks benefits and expectations were discussed with the patient. Patient understand risks, benefits and expectations and wishes to proceed.

## 2017-08-10 ENCOUNTER — Encounter: Payer: Self-pay | Admitting: *Deleted

## 2017-08-13 ENCOUNTER — Encounter (HOSPITAL_COMMUNITY)
Admission: RE | Admit: 2017-08-13 | Discharge: 2017-08-13 | Disposition: A | Discharge status: Home / Self Care | Payer: BLUE CROSS/BLUE SHIELD | Source: Ambulatory Visit | Attending: Orthopedic Surgery | Admitting: Orthopedic Surgery

## 2017-08-13 ENCOUNTER — Other Ambulatory Visit: Payer: Self-pay

## 2017-08-13 ENCOUNTER — Encounter (HOSPITAL_COMMUNITY): Payer: Self-pay

## 2017-08-13 DIAGNOSIS — Z01812 Encounter for preprocedural laboratory examination: Secondary | ICD-10-CM | POA: Insufficient documentation

## 2017-08-13 DIAGNOSIS — M1712 Unilateral primary osteoarthritis, left knee: Secondary | ICD-10-CM | POA: Diagnosis not present

## 2017-08-13 HISTORY — DX: Nausea with vomiting, unspecified: R11.2

## 2017-08-13 HISTORY — DX: Other specified postprocedural states: Z98.890

## 2017-08-13 HISTORY — DX: Presence of dental prosthetic device (complete) (partial): Z97.2

## 2017-08-13 LAB — CBC
HEMATOCRIT: 43.3 % (ref 39.0–52.0)
HEMOGLOBIN: 14.4 g/dL (ref 13.0–17.0)
MCH: 28.8 pg (ref 26.0–34.0)
MCHC: 33.3 g/dL (ref 30.0–36.0)
MCV: 86.6 fL (ref 78.0–100.0)
Platelets: 185 10*3/uL (ref 150–400)
RBC: 5 MIL/uL (ref 4.22–5.81)
RDW: 14 % (ref 11.5–15.5)
WBC: 10.4 10*3/uL (ref 4.0–10.5)

## 2017-08-13 LAB — BASIC METABOLIC PANEL
ANION GAP: 9 (ref 5–15)
BUN: 15 mg/dL (ref 6–20)
CHLORIDE: 103 mmol/L (ref 101–111)
CO2: 26 mmol/L (ref 22–32)
CREATININE: 0.92 mg/dL (ref 0.61–1.24)
Calcium: 9.1 mg/dL (ref 8.9–10.3)
GFR calc non Af Amer: >60 mL/min (ref >60)
Glucose, Bld: 92 mg/dL (ref 65–99)
POTASSIUM: 3.9 mmol/L (ref 3.5–5.1)
Sodium: 138 mmol/L (ref 135–145)

## 2017-08-13 LAB — SURGICAL PCR SCREEN
MRSA, PCR: NEGATIVE
Staphylococcus aureus: NEGATIVE

## 2017-08-13 NOTE — Progress Notes (Signed)
Pt denies SOB and chest pain. Pt under the care of Dr. Rayann Heman, Cardiology. Pt denies having a cardiac cath. Pt denies having a chest x ray within the last year.

## 2017-08-13 NOTE — Pre-Procedure Instructions (Signed)
Troy Acosta  08/13/2017      [id number] - [place], [place] [address] Phone: [phone] Fax: [phone]  [facility] [address] [place] - [address] [place] [address] Phone: [phone] Fax: [phone]  [facility] [place], [place] [address] [address] Phone: [phone] Fax: [phone]  [place] [place] [address] [place] [address] Phone: [phone] Fax: [phone]  [id number] - [place], [place] [address] Phone: [phone] Fax: [phone]    Your procedure is scheduled on Friday, August 24, 2017  Report to [facility] at 5:30 A.M.  Call this number if you have problems the morning of surgery:  [phone]   Remember:  Do not eat food or drink liquids after midnight Thursday, November 15  Take these medicines the morning of surgery with A SIP OF WATER : acetaminophen (TYLENOL), amLODipine (NORVASC), levothyroxine (SYNTHROID) Stop taking Aspirin, vitamins, fish oil and herbal medications. Do not take any NSAIDs ie: Ibuprofen, Advil, Naproxen (Aleve), Motrin, BC and Goody Powder ; stop Friday, August 17, 2017  Do not wear jewelry, make-up or nail polish.  Do not wear lotions, powders, or perfumes, or deodorant.  Do not shave 48 hours prior to surgery.  Men may shave face and neck.  Do not bring valuables to the hospital.  [facility] is not responsible for any belongings or valuables.  Contacts, dentures or bridgework may not be worn into surgery.  Leave your suitcase in the car.  After surgery it may be brought to your room. For patients admitted to the hospital, discharge time will be determined by your treatment team . Special instructions:   -  Preparing for Surgery  Before surgery, you can play an important role.  Because skin is not sterile, your skin needs to be as free of germs as possible.  You can reduce the number of germs on you skin by washing with CHG (chlorahexidine gluconate) soap before surgery.  CHG is an antiseptic cleaner which kills germs and bonds with the skin to continue killing germs even after washing.  Please DO NOT use if you have an allergy to CHG or antibacterial soaps.  If your skin becomes reddened/irritated stop using the CHG and inform your nurse when you arrive at Short Stay.  Do not shave (including legs and underarms) for at least 48 hours prior to the first CHG shower.  You may shave your face.  Please follow these instructions carefully:   1.  Shower with CHG Soap the night before surgery and the morning of Surgery.  2.  If you choose to wash your hair, wash your hair first as usual with your normal shampoo.  3.  After you shampoo, rinse your hair and body thoroughly to remove the Shampoo.  4.  Use CHG as you would any other liquid soap.  You can apply chg directly  to the skin and wash gently with scrungie or a clean washcloth.  5.  Apply the CHG Soap to your body ONLY FROM THE NECK DOWN.  Do not use on open wounds or open sores.  Avoid contact with your eyes, ears, mouth and genitals (private parts).  Wash genitals (private parts) with  your normal soap.  6.  Wash thoroughly, paying special attention to the area where your surgery will be performed.  7.  Thoroughly rinse your body with warm water from the neck down.  8.  DO NOT shower/wash with your normal soap after using and rinsing off the CHG Soap.  9.  Pat yourself dry with a clean towel.            10.  Wear clean pajamas.            11.  Place clean sheets on your bed the night of your first shower and do not sleep with pets.  Day of Surgery  Do not apply any lotions/deodorants the morning of surgery.  Please wear clean clothes to the  hospital/surgery center.  Please read over the following fact sheets that you were given. Pain Booklet, Coughing and Deep Breathing, MRSA Information and Surgical Site Infection Prevention

## 2017-08-16 DIAGNOSIS — Z96651 Presence of right artificial knee joint: Secondary | ICD-10-CM | POA: Diagnosis not present

## 2017-08-16 DIAGNOSIS — G8929 Other chronic pain: Secondary | ICD-10-CM | POA: Diagnosis not present

## 2017-08-16 DIAGNOSIS — M25561 Pain in right knee: Secondary | ICD-10-CM | POA: Diagnosis not present

## 2017-08-16 DIAGNOSIS — Z471 Aftercare following joint replacement surgery: Secondary | ICD-10-CM | POA: Diagnosis not present

## 2017-08-23 MED ORDER — CEFAZOLIN SODIUM-DEXTROSE 2-4 GM/100ML-% IV SOLN
2.0000 g | INTRAVENOUS | Status: AC
Start: 2017-08-24 — End: 2017-08-24
  Administered 2017-08-24: 2 g via INTRAVENOUS
  Filled 2017-08-23: qty 100

## 2017-08-23 MED ORDER — TRANEXAMIC ACID 1000 MG/10ML IV SOLN
1000.0000 mg | INTRAVENOUS | Status: AC
  Administered 2017-08-24: 1000 mg via INTRAVENOUS
  Filled 2017-08-23: qty 1100

## 2017-08-24 ENCOUNTER — Encounter (HOSPITAL_COMMUNITY): Payer: Self-pay | Admitting: *Deleted

## 2017-08-24 ENCOUNTER — Inpatient Hospital Stay (HOSPITAL_COMMUNITY): Payer: BLUE CROSS/BLUE SHIELD | Admitting: Certified Registered"

## 2017-08-24 ENCOUNTER — Inpatient Hospital Stay (HOSPITAL_COMMUNITY)
Admission: RE | Admit: 2017-08-24 | Discharge: 2017-08-26 | DRG: 470 | Disposition: A | Discharge status: Home Health | Payer: BLUE CROSS/BLUE SHIELD | Source: Ambulatory Visit | Attending: Orthopedic Surgery | Admitting: Orthopedic Surgery

## 2017-08-24 ENCOUNTER — Encounter (HOSPITAL_COMMUNITY)
Admission: RE | Disposition: A | Discharge status: Home Health | Payer: Self-pay | Source: Ambulatory Visit | Attending: Orthopedic Surgery

## 2017-08-24 ENCOUNTER — Other Ambulatory Visit: Payer: Self-pay

## 2017-08-24 DIAGNOSIS — I480 Paroxysmal atrial fibrillation: Secondary | ICD-10-CM | POA: Diagnosis not present

## 2017-08-24 DIAGNOSIS — F419 Anxiety disorder, unspecified: Secondary | ICD-10-CM | POA: Diagnosis present

## 2017-08-24 DIAGNOSIS — E7849 Other hyperlipidemia: Secondary | ICD-10-CM | POA: Diagnosis present

## 2017-08-24 DIAGNOSIS — Z96652 Presence of left artificial knee joint: Secondary | ICD-10-CM

## 2017-08-24 DIAGNOSIS — G8918 Other acute postprocedural pain: Secondary | ICD-10-CM | POA: Diagnosis not present

## 2017-08-24 DIAGNOSIS — D509 Iron deficiency anemia, unspecified: Secondary | ICD-10-CM | POA: Diagnosis present

## 2017-08-24 DIAGNOSIS — Z7982 Long term (current) use of aspirin: Secondary | ICD-10-CM

## 2017-08-24 DIAGNOSIS — Z79890 Hormone replacement therapy (postmenopausal): Secondary | ICD-10-CM

## 2017-08-24 DIAGNOSIS — I10 Essential (primary) hypertension: Secondary | ICD-10-CM | POA: Diagnosis present

## 2017-08-24 DIAGNOSIS — Z96651 Presence of right artificial knee joint: Secondary | ICD-10-CM | POA: Diagnosis not present

## 2017-08-24 DIAGNOSIS — Z972 Presence of dental prosthetic device (complete) (partial): Secondary | ICD-10-CM | POA: Diagnosis not present

## 2017-08-24 DIAGNOSIS — Z79899 Other long term (current) drug therapy: Secondary | ICD-10-CM

## 2017-08-24 DIAGNOSIS — E039 Hypothyroidism, unspecified: Secondary | ICD-10-CM | POA: Diagnosis present

## 2017-08-24 DIAGNOSIS — M1712 Unilateral primary osteoarthritis, left knee: Secondary | ICD-10-CM | POA: Diagnosis not present

## 2017-08-24 DIAGNOSIS — Z87891 Personal history of nicotine dependence: Secondary | ICD-10-CM | POA: Diagnosis not present

## 2017-08-24 HISTORY — PX: TOTAL KNEE ARTHROPLASTY: SHX125

## 2017-08-24 LAB — TYPE AND SCREEN
ABO/RH(D): A POS
Antibody Screen: NEGATIVE

## 2017-08-24 LAB — ABO/RH: ABO/RH(D): A POS

## 2017-08-24 SURGERY — ARTHROPLASTY, KNEE, TOTAL
Anesthesia: Regional | Site: Knee | Laterality: Left

## 2017-08-24 MED ORDER — LACTATED RINGERS IV SOLN
INTRAVENOUS | Status: DC | PRN
  Administered 2017-08-24 (×2): via INTRAVENOUS

## 2017-08-24 MED ORDER — METHOCARBAMOL 1000 MG/10ML IJ SOLN
500.0000 mg | Freq: Four times a day (QID) | INTRAVENOUS | Status: DC | PRN
  Filled 2017-08-24: qty 5

## 2017-08-24 MED ORDER — BISACODYL 10 MG RE SUPP: 10.0000 mg | Freq: Every day | RECTAL | Status: DC | PRN

## 2017-08-24 MED ORDER — MIDAZOLAM HCL 2 MG/2ML IJ SOLN
INTRAMUSCULAR | Status: AC
  Filled 2017-08-24: qty 2

## 2017-08-24 MED ORDER — DEXAMETHASONE SODIUM PHOSPHATE 10 MG/ML IJ SOLN
INTRAMUSCULAR | Status: DC | PRN
  Administered 2017-08-24: 10 mg via INTRAVENOUS

## 2017-08-24 MED ORDER — ACETAMINOPHEN 325 MG PO TABS: 650.0000 mg | ORAL_TABLET | ORAL | Status: DC | PRN

## 2017-08-24 MED ORDER — SODIUM CHLORIDE 0.9 % IV SOLN
INTRAVENOUS | Status: DC
  Administered 2017-08-24: 13:00:00 via INTRAVENOUS

## 2017-08-24 MED ORDER — ASPIRIN 81 MG PO CHEW
81.0000 mg | CHEWABLE_TABLET | Freq: Two times a day (BID) | ORAL | Status: DC
  Administered 2017-08-24 – 2017-08-26 (×4): 81 mg via ORAL
  Filled 2017-08-24 (×4): qty 1

## 2017-08-24 MED ORDER — SODIUM CHLORIDE 0.9 % IV SOLN: Freq: Once | INTRAVENOUS | Status: DC

## 2017-08-24 MED ORDER — MIDAZOLAM HCL 2 MG/2ML IJ SOLN
INTRAMUSCULAR | Status: DC | PRN
  Administered 2017-08-24: 2 mg via INTRAVENOUS

## 2017-08-24 MED ORDER — METHOCARBAMOL 500 MG PO TABS
500.0000 mg | ORAL_TABLET | Freq: Four times a day (QID) | ORAL | Status: DC | PRN
  Administered 2017-08-24 – 2017-08-26 (×6): 500 mg via ORAL
  Filled 2017-08-24 (×6): qty 1

## 2017-08-24 MED ORDER — POLYSACCHARIDE IRON COMPLEX 150 MG PO CAPS
150.0000 mg | ORAL_CAPSULE | Freq: Every day | ORAL | Status: DC
  Administered 2017-08-25 – 2017-08-26 (×2): 150 mg via ORAL
  Filled 2017-08-24 (×2): qty 1

## 2017-08-24 MED ORDER — ACETAMINOPHEN 500 MG PO TABS: 500.0000 mg | ORAL_TABLET | Freq: Two times a day (BID) | ORAL | Status: DC

## 2017-08-24 MED ORDER — PHENYLEPHRINE HCL 10 MG/ML IJ SOLN
INTRAVENOUS | Status: DC | PRN
  Administered 2017-08-24: 20 ug/min via INTRAVENOUS

## 2017-08-24 MED ORDER — MENTHOL 3 MG MT LOZG: 1.0000 | LOZENGE | OROMUCOSAL | Status: DC | PRN

## 2017-08-24 MED ORDER — CHLORHEXIDINE GLUCONATE 4 % EX LIQD: 60.0000 mL | Freq: Once | CUTANEOUS | Status: DC

## 2017-08-24 MED ORDER — OXYCODONE HCL 5 MG PO TABS
10.0000 mg | ORAL_TABLET | ORAL | Status: DC | PRN
  Administered 2017-08-24 – 2017-08-26 (×12): 10 mg via ORAL
  Filled 2017-08-24 (×12): qty 2

## 2017-08-24 MED ORDER — ACETAMINOPHEN 500 MG PO TABS
500.0000 mg | ORAL_TABLET | Freq: Two times a day (BID) | ORAL | Status: DC
  Administered 2017-08-24 – 2017-08-26 (×4): 500 mg via ORAL
  Filled 2017-08-24 (×4): qty 1

## 2017-08-24 MED ORDER — SODIUM CHLORIDE 0.9 % IV SOLN
1000.0000 mg | Freq: Once | INTRAVENOUS | Status: AC
  Administered 2017-08-24: 1000 mg via INTRAVENOUS
  Filled 2017-08-24: qty 10

## 2017-08-24 MED ORDER — SODIUM CHLORIDE 0.9 % IR SOLN
Status: DC | PRN
  Administered 2017-08-24: 3000 mL

## 2017-08-24 MED ORDER — PHENOL 1.4 % MT LIQD: 1.0000 | OROMUCOSAL | Status: DC | PRN

## 2017-08-24 MED ORDER — PHENYLEPHRINE 40 MCG/ML (10ML) SYRINGE FOR IV PUSH (FOR BLOOD PRESSURE SUPPORT): PREFILLED_SYRINGE | INTRAVENOUS | Status: DC | PRN

## 2017-08-24 MED ORDER — DOCUSATE SODIUM 100 MG PO CAPS
100.0000 mg | ORAL_CAPSULE | Freq: Two times a day (BID) | ORAL | Status: DC
  Administered 2017-08-24 – 2017-08-26 (×4): 100 mg via ORAL
  Filled 2017-08-24 (×4): qty 1

## 2017-08-24 MED ORDER — ONDANSETRON HCL 4 MG/2ML IJ SOLN
INTRAMUSCULAR | Status: AC
  Filled 2017-08-24: qty 2

## 2017-08-24 MED ORDER — POLYETHYLENE GLYCOL 3350 17 G PO PACK: 17.0000 g | PACK | Freq: Every day | ORAL | Status: DC | PRN

## 2017-08-24 MED ORDER — PROPOFOL 10 MG/ML IV BOLUS
INTRAVENOUS | Status: AC
  Filled 2017-08-24: qty 20

## 2017-08-24 MED ORDER — METOCLOPRAMIDE HCL 5 MG/ML IJ SOLN: 5.0000 mg | Freq: Three times a day (TID) | INTRAMUSCULAR | Status: DC | PRN

## 2017-08-24 MED ORDER — FENTANYL CITRATE (PF) 100 MCG/2ML IJ SOLN
INTRAMUSCULAR | Status: DC | PRN
  Administered 2017-08-24: 25 ug via INTRAVENOUS
  Administered 2017-08-24: 50 ug via INTRAVENOUS
  Administered 2017-08-24: 25 ug via INTRAVENOUS

## 2017-08-24 MED ORDER — FENTANYL CITRATE (PF) 250 MCG/5ML IJ SOLN
INTRAMUSCULAR | Status: AC
  Filled 2017-08-24: qty 5

## 2017-08-24 MED ORDER — DEXAMETHASONE SODIUM PHOSPHATE 10 MG/ML IJ SOLN
INTRAMUSCULAR | Status: AC
  Filled 2017-08-24: qty 1

## 2017-08-24 MED ORDER — PROPOFOL 500 MG/50ML IV EMUL
INTRAVENOUS | Status: DC | PRN
  Administered 2017-08-24: 09:00:00 via INTRAVENOUS
  Administered 2017-08-24: 200 ug/kg/min via INTRAVENOUS

## 2017-08-24 MED ORDER — ROSUVASTATIN CALCIUM 5 MG PO TABS
5.0000 mg | ORAL_TABLET | Freq: Every evening | ORAL | Status: DC
  Administered 2017-08-24 – 2017-08-25 (×2): 5 mg via ORAL
  Filled 2017-08-24 (×2): qty 1

## 2017-08-24 MED ORDER — OXYCODONE-ACETAMINOPHEN 5-325 MG PO TABS: 1.0000 | ORAL_TABLET | ORAL | 0 refills | Status: DC | PRN

## 2017-08-24 MED ORDER — GLYCOPYRROLATE 0.2 MG/ML IV SOSY
PREFILLED_SYRINGE | INTRAVENOUS | Status: DC | PRN
  Administered 2017-08-24: .2 mg via INTRAVENOUS

## 2017-08-24 MED ORDER — ASPIRIN 81 MG PO CHEW: 81.0000 mg | CHEWABLE_TABLET | Freq: Two times a day (BID) | ORAL | 0 refills | Status: DC

## 2017-08-24 MED ORDER — ACETAMINOPHEN 650 MG RE SUPP: 650.0000 mg | RECTAL | Status: DC | PRN

## 2017-08-24 MED ORDER — VITAMIN D 1000 UNITS PO TABS
2000.0000 [IU] | ORAL_TABLET | Freq: Every evening | ORAL | Status: DC
  Administered 2017-08-24 – 2017-08-25 (×2): 2000 [IU] via ORAL
  Filled 2017-08-24 (×2): qty 2

## 2017-08-24 MED ORDER — ONDANSETRON HCL 4 MG/2ML IJ SOLN: 4.0000 mg | Freq: Four times a day (QID) | INTRAMUSCULAR | Status: DC | PRN

## 2017-08-24 MED ORDER — MORPHINE SULFATE (PF) 4 MG/ML IV SOLN
2.0000 mg | INTRAVENOUS | Status: DC | PRN
  Administered 2017-08-24: 4 mg via INTRAVENOUS
  Administered 2017-08-24: 2 mg via INTRAVENOUS
  Administered 2017-08-24 – 2017-08-25 (×6): 4 mg via INTRAVENOUS
  Filled 2017-08-24 (×9): qty 1

## 2017-08-24 MED ORDER — ROPIVACAINE HCL 7.5 MG/ML IJ SOLN
INTRAMUSCULAR | Status: DC | PRN
  Administered 2017-08-24: 20 mL via PERINEURAL

## 2017-08-24 MED ORDER — 0.9 % SODIUM CHLORIDE (POUR BTL) OPTIME
TOPICAL | Status: DC | PRN
  Administered 2017-08-24: 1000 mL

## 2017-08-24 MED ORDER — METHOCARBAMOL 500 MG PO TABS: 500.0000 mg | ORAL_TABLET | Freq: Three times a day (TID) | ORAL | 1 refills | Status: DC | PRN

## 2017-08-24 MED ORDER — IRBESARTAN 300 MG PO TABS
300.0000 mg | ORAL_TABLET | Freq: Every day | ORAL | Status: DC
  Administered 2017-08-25 – 2017-08-26 (×2): 300 mg via ORAL
  Filled 2017-08-24 (×2): qty 1

## 2017-08-24 MED ORDER — ONDANSETRON HCL 4 MG PO TABS: 4.0000 mg | ORAL_TABLET | Freq: Four times a day (QID) | ORAL | Status: DC | PRN

## 2017-08-24 MED ORDER — CEFAZOLIN SODIUM-DEXTROSE 2-4 GM/100ML-% IV SOLN
2.0000 g | Freq: Four times a day (QID) | INTRAVENOUS | Status: AC
  Administered 2017-08-24 (×2): 2 g via INTRAVENOUS
  Filled 2017-08-24 (×2): qty 100

## 2017-08-24 MED ORDER — ONDANSETRON HCL 4 MG/2ML IJ SOLN
INTRAMUSCULAR | Status: DC | PRN
  Administered 2017-08-24: 4 mg via INTRAVENOUS

## 2017-08-24 MED ORDER — METHOCARBAMOL 500 MG PO TABS: 500.0000 mg | ORAL_TABLET | Freq: Four times a day (QID) | ORAL | Status: DC | PRN

## 2017-08-24 MED ORDER — BUPIVACAINE IN DEXTROSE 0.75-8.25 % IT SOLN
INTRATHECAL | Status: DC | PRN
  Administered 2017-08-24: 2 mL via INTRATHECAL

## 2017-08-24 MED ORDER — AMLODIPINE BESYLATE 5 MG PO TABS
5.0000 mg | ORAL_TABLET | Freq: Every day | ORAL | Status: DC
  Administered 2017-08-25 – 2017-08-26 (×2): 5 mg via ORAL
  Filled 2017-08-24 (×2): qty 1

## 2017-08-24 MED ORDER — LEVOTHYROXINE SODIUM 25 MCG PO TABS
162.5000 ug | ORAL_TABLET | ORAL | Status: DC
  Filled 2017-08-24: qty 7

## 2017-08-24 MED ORDER — LEVOTHYROXINE SODIUM 75 MCG PO TABS
175.0000 ug | ORAL_TABLET | ORAL | Status: DC
  Administered 2017-08-25 – 2017-08-26 (×2): 175 ug via ORAL
  Filled 2017-08-24 (×2): qty 1

## 2017-08-24 MED ORDER — METOCLOPRAMIDE HCL 5 MG PO TABS: 5.0000 mg | ORAL_TABLET | Freq: Three times a day (TID) | ORAL | Status: DC | PRN

## 2017-08-24 MED ORDER — ASPIRIN 81 MG PO CHEW: 81.0000 mg | CHEWABLE_TABLET | Freq: Two times a day (BID) | ORAL | Status: DC

## 2017-08-24 MED ORDER — OXYCODONE HCL 5 MG PO TABS
5.0000 mg | ORAL_TABLET | ORAL | Status: DC | PRN
  Administered 2017-08-24 – 2017-08-26 (×7): 5 mg via ORAL
  Filled 2017-08-24 (×7): qty 1

## 2017-08-24 MED ORDER — LEVOTHYROXINE SODIUM 75 MCG PO TABS: 150.0000 ug | ORAL_TABLET | Freq: Every day | ORAL | Status: DC

## 2017-08-24 MED ORDER — HYDROCHLOROTHIAZIDE 25 MG PO TABS
25.0000 mg | ORAL_TABLET | Freq: Every day | ORAL | Status: DC
  Administered 2017-08-25 – 2017-08-26 (×2): 25 mg via ORAL
  Filled 2017-08-24 (×2): qty 1

## 2017-08-24 MED ORDER — LIDOCAINE 2% (20 MG/ML) 5 ML SYRINGE
INTRAMUSCULAR | Status: AC
  Filled 2017-08-24: qty 5

## 2017-08-24 SURGICAL SUPPLY — 60 items
BANDAGE ESMARK 6X9 LF (GAUZE/BANDAGES/DRESSINGS) ×1 IMPLANT
BLADE SAG 18X100X1.27 (BLADE) ×2 IMPLANT
BLADE SAW SGTL 13X75X1.27 (BLADE) ×2 IMPLANT
BNDG CMPR 9X6 STRL LF SNTH (GAUZE/BANDAGES/DRESSINGS) ×1
BNDG CMPR MED 10X6 ELC LF (GAUZE/BANDAGES/DRESSINGS) ×1
BNDG ELASTIC 6X10 VLCR STRL LF (GAUZE/BANDAGES/DRESSINGS) ×2 IMPLANT
BNDG ESMARK 6X9 LF (GAUZE/BANDAGES/DRESSINGS) ×2
BNDG GAUZE ELAST 4 BULKY (GAUZE/BANDAGES/DRESSINGS) ×4 IMPLANT
BOWL SMART MIX CTS (DISPOSABLE) ×2 IMPLANT
CAP KNEE TOTAL 3 SIGMA ×1 IMPLANT
CEMENT HV SMART SET (Cement) ×4 IMPLANT
COVER SURGICAL LIGHT HANDLE (MISCELLANEOUS) ×2 IMPLANT
CUFF TOURNIQUET SINGLE 34IN LL (TOURNIQUET CUFF) ×2 IMPLANT
CUFF TOURNIQUET SINGLE 44IN (TOURNIQUET CUFF) IMPLANT
DRAPE EXTREMITY T 121X128X90 (DRAPE) ×2 IMPLANT
DRAPE HALF SHEET 40X57 (DRAPES) ×2 IMPLANT
DRAPE ORTHO SPLIT 77X108 STRL (DRAPES) ×2
DRAPE SURG ORHT 6 SPLT 77X108 (DRAPES) IMPLANT
DRAPE U-SHAPE 47X51 STRL (DRAPES) ×2 IMPLANT
DRSG ADAPTIC 3X8 NADH LF (GAUZE/BANDAGES/DRESSINGS) ×2 IMPLANT
DRSG PAD ABDOMINAL 8X10 ST (GAUZE/BANDAGES/DRESSINGS) ×2 IMPLANT
DURAPREP 26ML APPLICATOR (WOUND CARE) ×2 IMPLANT
ELECT CAUTERY BLADE 6.4 (BLADE) ×2 IMPLANT
ELECT REM PT RETURN 9FT ADLT (ELECTROSURGICAL) ×2
ELECTRODE REM PT RTRN 9FT ADLT (ELECTROSURGICAL) ×1 IMPLANT
FACESHIELD WRAPAROUND (MASK) ×2 IMPLANT
GAUZE SPONGE 4X4 12PLY STRL (GAUZE/BANDAGES/DRESSINGS) ×2 IMPLANT
GLOVE BIOGEL PI ORTHO PRO 7.5 (GLOVE) ×1
GLOVE BIOGEL PI ORTHO PRO SZ8 (GLOVE) ×1
GLOVE ORTHO TXT STRL SZ7.5 (GLOVE) ×2 IMPLANT
GLOVE PI ORTHO PRO STRL 7.5 (GLOVE) ×1 IMPLANT
GLOVE PI ORTHO PRO STRL SZ8 (GLOVE) ×1 IMPLANT
GLOVE SURG ORTHO 8.5 STRL (GLOVE) ×2 IMPLANT
GOWN STRL REUS W/ TWL XL LVL3 (GOWN DISPOSABLE) ×3 IMPLANT
GOWN STRL REUS W/TWL XL LVL3 (GOWN DISPOSABLE) ×6
HANDPIECE INTERPULSE COAX TIP (DISPOSABLE) ×2
IMMOBILIZER KNEE 22 UNIV (SOFTGOODS) ×1 IMPLANT
KIT BASIN OR (CUSTOM PROCEDURE TRAY) ×2 IMPLANT
KIT MANIFOLD (MISCELLANEOUS) ×2 IMPLANT
KIT ROOM TURNOVER OR (KITS) ×2 IMPLANT
MANIFOLD NEPTUNE II (INSTRUMENTS) ×2 IMPLANT
NS IRRIG 1000ML POUR BTL (IV SOLUTION) ×2 IMPLANT
PACK TOTAL JOINT (CUSTOM PROCEDURE TRAY) ×2 IMPLANT
PAD ABD 8X10 STRL (GAUZE/BANDAGES/DRESSINGS) IMPLANT
PAD ARMBOARD 7.5X6 YLW CONV (MISCELLANEOUS) ×4 IMPLANT
SET HNDPC FAN SPRY TIP SCT (DISPOSABLE) ×1 IMPLANT
STRIP CLOSURE SKIN 1/2X4 (GAUZE/BANDAGES/DRESSINGS) ×4 IMPLANT
SUCTION FRAZIER HANDLE 10FR (MISCELLANEOUS) ×1
SUCTION TUBE FRAZIER 10FR DISP (MISCELLANEOUS) ×1 IMPLANT
SUT MNCRL AB 3-0 PS2 18 (SUTURE) ×2 IMPLANT
SUT VIC AB 0 CT1 27 (SUTURE) ×4
SUT VIC AB 0 CT1 27XBRD ANBCTR (SUTURE) ×2 IMPLANT
SUT VIC AB 1 CT1 27 (SUTURE) ×4
SUT VIC AB 1 CT1 27XBRD ANBCTR (SUTURE) ×2 IMPLANT
SUT VIC AB 2-0 CT1 27 (SUTURE) ×4
SUT VIC AB 2-0 CT1 TAPERPNT 27 (SUTURE) ×2 IMPLANT
TOWEL OR 17X24 6PK STRL BLUE (TOWEL DISPOSABLE) ×2 IMPLANT
TOWEL OR 17X26 10 PK STRL BLUE (TOWEL DISPOSABLE) ×2 IMPLANT
TRAY CATH 16FR W/PLASTIC CATH (SET/KITS/TRAYS/PACK) IMPLANT
TRAY FOLEY W/METER SILVER 16FR (SET/KITS/TRAYS/PACK) ×1 IMPLANT

## 2017-08-24 NOTE — Anesthesia Procedure Notes (Signed)
Procedure Name: MAC Date/Time: 08/24/2017 7:39 AM Performed by: Imagene Riches, CRNA Pre-anesthesia Checklist: Patient identified, Emergency Drugs available and Suction available Patient Re-evaluated:Patient Re-evaluated prior to induction Oxygen Delivery Method: Simple face mask

## 2017-08-24 NOTE — Progress Notes (Signed)
Orthopedic Tech Progress Note Patient Details:  Troy Acosta October 31, 1950 [id number]  CPM Left Knee CPM Left Knee: On Left Knee Flexion (Degrees): 60 Left Knee Extension (Degrees): 0   Troy Acosta 08/24/2017, 10:06 AM

## 2017-08-24 NOTE — Anesthesia Postprocedure Evaluation (Signed)
Anesthesia Post Note  Patient: Troy Acosta  Procedure(s) Performed: LEFT TOTAL KNEE ARTHROPLASTY (Left Knee)     Patient location during evaluation: PACU Anesthesia Type: Regional Level of consciousness: oriented and awake and alert Pain management: pain level controlled Vital Signs Assessment: post-procedure vital signs reviewed and stable Respiratory status: spontaneous breathing, respiratory function stable and patient connected to nasal cannula oxygen Cardiovascular status: blood pressure returned to baseline and stable Postop Assessment: no headache, no backache and no apparent nausea or vomiting Anesthetic complications: no    Last Vitals:  Vitals:   08/24/17 1150 08/24/17 1250  BP: 111/67 117/71  Pulse: (!) 57 74  Resp: 16 16  Temp: 36.9 C 36.8 C  SpO2: 99% 98%    Last Pain:  Vitals:   08/24/17 1250  TempSrc: Oral  PainSc:                  Troy Acosta,Troy Acosta

## 2017-08-24 NOTE — Evaluation (Signed)
Physical Therapy Evaluation Patient Details Name: Troy Acosta MRN: [id number] DOB: 06-19-1951 Today's Date: 08/24/2017   History of Present Illness  66 y.o. male s/p L TKA   PMH includes: R TKA, A Fib, Anxiety, HTN.   Clinical Impression  Patient is s/p above surgery resulting in functional limitations due to the deficits listed below (see PT Problem List). PTA, pt was independent with all mobility and working. Pt lives in a 1 level home with his wife who is available 24/7 to support. Today pt presents with mild post op pain and weakness that limits his mobility.  Pt demonstrating good activity tolerance will progress therex and stair training next visit.  Patient will benefit from skilled PT to increase their independence and safety with mobility to allow discharge to the venue listed below.       Follow Up Recommendations Home health PT;DC plan and follow up therapy as arranged by surgeon    Equipment Recommendations  None recommended by PT    Recommendations for Other Services       Precautions / Restrictions Precautions Precautions: Knee;Fall Precaution Booklet Issued: Yes (comment) Precaution Comments: Reviewed knee precautions, no pillow under knee Restrictions Weight Bearing Restrictions: Yes LLE Weight Bearing: Weight bearing as tolerated      Mobility  Bed Mobility Overal bed mobility: Modified Independent             General bed mobility comments: supine<>sit without phsyical assistance  Transfers Overall transfer level: Needs assistance Equipment used: Rolling walker (2 wheeled) Transfers: Sit to/from Stand Sit to Stand: Supervision         General transfer comment: Pt demonstrating safe hand placement, supervision for safety and line managment.   Ambulation/Gait Ambulation/Gait assistance: Min guard Ambulation Distance (Feet): 175 Feet Assistive device: Rolling walker (2 wheeled) Gait Pattern/deviations: Step-through pattern;Antalgic Gait velocity:  Normal   General Gait Details: Pt ambulating with normal cadence, step through gait, with symetrical step length. Cues for heel strike   Stairs            Wheelchair Mobility    Modified Rankin (Stroke Patients Only)       Balance Overall balance assessment: Needs assistance Sitting-balance support: Feet unsupported;No upper extremity supported Sitting balance-Leahy Scale: Normal     Standing balance support: Bilateral upper extremity supported;During functional activity Standing balance-Leahy Scale: Fair                               Pertinent Vitals/Pain Pain Assessment: 0-10 Pain Score: 5  Pain Location: L KNEE Pain Descriptors / Indicators: Operative site guarding;Discomfort;Aching Pain Intervention(s): Limited activity within patient's tolerance;Monitored during session;Repositioned    Home Living Family/patient expects to be discharged to:: Private residence Living Arrangements: Spouse/significant other Available Help at Discharge: Family;Available 24 hours/day Type of Home: House Home Access: Stairs to enter Entrance Stairs-Rails: None Entrance Stairs-Number of Steps: 2 Home Layout: Two level;Able to live on main level with bedroom/bathroom Home Equipment: Bedside commode;Shower seat;Toilet riser;Walker - 4 wheels;Cane - single point      Prior Function Level of Independence: Independent         Comments: working     Journalist, newspaper   Dominant Hand: Right    Extremity/Trunk Assessment        Lower Extremity Assessment Lower Extremity Assessment: (RLE strength 4/5, LLE 3+/5, light touch WNL  BLE)       Communication   Communication: No difficulties  Cognition Arousal/Alertness:  Awake/alert Behavior During Therapy: WFL for tasks assessed/performed Overall Cognitive Status: Within Functional Limits for tasks assessed                                        General Comments General comments (skin integrity,  edema, etc.): VSS throughout session. Wife present. Educated patient on rehab course and disucssed prior course of care, pt reports not being pleased with OP physical therapy citing he felt he spent too much time doing passive modalites.     Exercises General Exercises - Lower Extremity Ankle Circles/Pumps: AROM;Left;10 reps Quad Sets: AROM;Both;10 reps Short Arc Quad: AROM;Both;10 reps   Assessment/Plan    PT Assessment Patient needs continued PT services  PT Problem List Decreased strength;Decreased range of motion;Decreased activity tolerance;Decreased balance;Decreased mobility       PT Treatment Interventions DME instruction;Gait training;Stair training;Functional mobility training;Therapeutic activities;Therapeutic exercise    PT Goals (Current goals can be found in the Care Plan section)  Acute Rehab PT Goals Patient Stated Goal: to return home with HHPT PT Goal Formulation: With patient/family Time For Goal Achievement: 08/28/17 Potential to Achieve Goals: Good    Frequency 7X/week   Barriers to discharge        Co-evaluation               AM-PAC PT "6 Clicks" Daily Activity  Outcome Measure Difficulty turning over in bed (including adjusting bedclothes, sheets and blankets)?: A Little Difficulty moving from lying on back to sitting on the side of the bed? : A Little Difficulty sitting down on and standing up from a chair with arms (e.g., wheelchair, bedside commode, etc,.)?: A Little Help needed moving to and from a bed to chair (including a wheelchair)?: A Little Help needed walking in hospital room?: A Little Help needed climbing 3-5 steps with a railing? : A Little 6 Click Score: 18    End of Session Equipment Utilized During Treatment: Gait belt Activity Tolerance: Patient tolerated treatment well;No increased pain Patient left: in bed;with call bell/phone within reach;with nursing/sitter in room   PT Visit Diagnosis: Unsteadiness on feet  (R26.81);Other abnormalities of gait and mobility (R26.89);Repeated falls (R29.6)    Time: 1525-1600 PT Time Calculation (min) (ACUTE ONLY): 35 min   Charges:   PT Evaluation $PT Eval Low Complexity: 1 Low PT Treatments $Gait Training: 8-22 mins   PT G Codes:        Reinaldo Berber, PT, DPT Acute Rehab Services Pager: [phone]    Reinaldo Berber 08/24/2017, 5:06 PM

## 2017-08-24 NOTE — Interval H&P Note (Signed)
History and Physical Interval Note:  08/24/2017 7:20 AM  Troy Acosta  has presented today for surgery, with the diagnosis of Left knee osteoarthritis  The various methods of treatment have been discussed with the patient and family. After consideration of risks, benefits and other options for treatment, the patient has consented to  Procedure(s): LEFT TOTAL KNEE ARTHROPLASTY (Left) as a surgical intervention .  The patient's history has been reviewed, patient examined, no change in status, stable for surgery.  I have reviewed the patient's chart and labs.  Questions were answered to the patient's satisfaction.     Jisela Merlino,STEVEN R

## 2017-08-24 NOTE — Brief Op Note (Signed)
08/24/2017  9:24 AM  PATIENT:  Troy Acosta  66 y.o. male  PRE-OPERATIVE DIAGNOSIS:  Left knee osteoarthritis, end stage  POST-OPERATIVE DIAGNOSIS:  Left knee osteoarthritis, end stage  PROCEDURE:  Procedure(s): LEFT TOTAL KNEE ARTHROPLASTY (Left) DePuy Sigma RP  SURGEON:  Surgeon(s) and Role:    [name], MD - Primary  PHYSICIAN ASSISTANT:   ASSISTANTS: Ventura Bruns, PA-C   ANESTHESIA:   regional and spinal  EBL:  100 mL   BLOOD ADMINISTERED:none  DRAINS: none   LOCAL MEDICATIONS USED:  NONE  SPECIMEN:  No Specimen  DISPOSITION OF SPECIMEN:  N/A  COUNTS:  YES  TOURNIQUET:  * Missing tourniquet times found for documented tourniquets in log: [id number] *  DICTATION: .Other Dictation: Dictation Number [id number]  PLAN OF CARE: Admit to inpatient   PATIENT DISPOSITION:  PACU - hemodynamically stable.   Delay start of Pharmacological VTE agent (>24hrs) due to surgical blood loss or risk of bleeding: no

## 2017-08-24 NOTE — Transfer of Care (Signed)
Immediate Anesthesia Transfer of Care Note  Patient: Troy Acosta  Procedure(s) Performed: LEFT TOTAL KNEE ARTHROPLASTY (Left Knee)  Patient Location: PACU  Anesthesia Type:MAC combined with regional for post-op pain  Level of Consciousness: drowsy  Airway & Oxygen Therapy: Patient Spontanous Breathing and Patient connected to face mask oxygen  Post-op Assessment: Report given to RN and Post -op Vital signs reviewed and stable  Post vital signs: Reviewed and stable  Last Vitals:  Vitals:   08/24/17 0646 08/24/17 0938  BP: 124/80 93/63  Pulse: (!) 53 65  Resp: 18 14  Temp: 36.6 C (!) 36.4 C  SpO2: 100% 97%    Last Pain:  Vitals:   08/24/17 0938  TempSrc:   PainSc: (P) Asleep      Patients Stated Pain Goal: 3 (93/79/02 4097)  Complications: No apparent anesthesia complications

## 2017-08-24 NOTE — Anesthesia Procedure Notes (Signed)
Spinal  Patient location during procedure: OR Start time: 08/24/2017 7:35 AM End time: 08/24/2017 7:42 AM Staffing Anesthesiologist: Rica Koyanagi, MD Performed: anesthesiologist  Preanesthetic Checklist Completed: patient identified, site marked, surgical consent, pre-op evaluation, timeout performed, risks and benefits discussed and monitors and equipment checked Spinal Block Patient position: sitting Prep: ChloraPrep Patient monitoring: blood pressure, continuous pulse ox, cardiac monitor and heart rate Approach: midline Location: L3-4 Injection technique: single-shot Needle Needle type: Quincke  Needle gauge: 25 G Needle length: 9 cm Needle insertion depth: 7 cm Assessment Sensory level: T6

## 2017-08-24 NOTE — Anesthesia Preprocedure Evaluation (Addendum)
Anesthesia Evaluation  Patient identified by MRN, date of birth, ID band Patient awake    Reviewed: Allergy & Precautions, NPO status , Patient's Chart, lab work & pertinent test results  History of Anesthesia Complications (+) PONV  Airway Mallampati: II  TM Distance: >3 FB Neck ROM: Full    Dental no notable dental hx.    Pulmonary shortness of breath, former smoker,    breath sounds clear to auscultation       Cardiovascular hypertension, + dysrhythmias  Rhythm:Regular Rate:Normal     Neuro/Psych  Neuromuscular disease    GI/Hepatic   Endo/Other  Hypothyroidism Morbid obesity  Renal/GU      Musculoskeletal   Abdominal   Peds  Hematology   Anesthesia Other Findings   Reproductive/Obstetrics                            Anesthesia Physical Anesthesia Plan  ASA: III  Anesthesia Plan: Regional and Spinal   Post-op Pain Management:    Induction:   PONV Risk Score and Plan: 3 and Dexamethasone, Ondansetron and Treatment may vary due to age or medical condition  Airway Management Planned: Natural Airway  Additional Equipment:   Intra-op Plan:   Post-operative Plan:   Informed Consent: I have reviewed the patients History and Physical, chart, labs and discussed the procedure including the risks, benefits and alternatives for the proposed anesthesia with the patient or authorized representative who has indicated his/her understanding and acceptance.     Plan Discussed with: CRNA  Anesthesia Plan Comments:         Anesthesia Quick Evaluation

## 2017-08-24 NOTE — Care Management Note (Signed)
Case Management Note  Patient Details  Name: Troy Acosta MRN: [id number] Date of Birth: 09-Sep-1951  Subjective/Objective:  66 yr old gentleman s/p left total knee arthroplasty.                 Action/Plan: Case manager spoke with patient and his family concerning discharge plan and DME. Patient was preoperatively setup with [facility], no changes. He has RW, 3in1 and canes.CPM will be delivered to his home at discharge.    Expected Discharge Date:    08/25/17              Expected Discharge Plan:  [place]  In-House Referral:     Discharge planning Services  CM Consult  Post Acute Care Choice:  Durable Medical Equipment, Home Health Choice offered to:  Patient, Spouse  DME Arranged:  CPM(Has RW,3in1, Canes) DME Agency:  [employer]  HH Arranged:  PT [place]:  [facility] (formerly [employer])  Status of Service:  Completed, signed off  If discussed at [employer], dates discussed:    Additional Comments:  Ninfa Meeker, RN 08/24/2017, 12:11 PM

## 2017-08-24 NOTE — Anesthesia Procedure Notes (Addendum)
Anesthesia Regional Block: Adductor canal block   Pre-Anesthetic Checklist: ,, timeout performed, Correct Patient, Correct Site, Correct Laterality, Correct Procedure, Correct Position, site marked, Risks and benefits discussed,  Surgical consent,  Pre-op evaluation,  At surgeon's request and post-op pain management  Laterality: Left and Lower  Prep: chloraprep       Needles:   Needle Type: Echogenic Stimulator Needle     Needle Length: 9cm  Needle Gauge: 21   Needle insertion depth: 7 cm   Additional Needles:   Procedures:,,,, ultrasound used (permanent image in chart),,,,  Narrative:  Start time: 08/24/2017 7:10 AM End time: 08/24/2017 7:11 AM Injection made incrementally with aspirations every 5 mL.  Performed by: Personally  Anesthesiologist: Rica Koyanagi, MD

## 2017-08-25 LAB — BASIC METABOLIC PANEL
ANION GAP: 8 (ref 5–15)
BUN: 18 mg/dL (ref 6–20)
CHLORIDE: 102 mmol/L (ref 101–111)
CO2: 27 mmol/L (ref 22–32)
CREATININE: 0.98 mg/dL (ref 0.61–1.24)
Calcium: 8.4 mg/dL — ABNORMAL LOW (ref 8.9–10.3)
GFR calc non Af Amer: >60 mL/min (ref >60)
Glucose, Bld: 138 mg/dL — ABNORMAL HIGH (ref 65–99)
Potassium: 3.5 mmol/L (ref 3.5–5.1)
SODIUM: 137 mmol/L (ref 135–145)

## 2017-08-25 LAB — CBC
HCT: 38.5 % — ABNORMAL LOW (ref 39.0–52.0)
HEMOGLOBIN: 12.5 g/dL — AB (ref 13.0–17.0)
MCH: 28.3 pg (ref 26.0–34.0)
MCHC: 32.5 g/dL (ref 30.0–36.0)
MCV: 87.1 fL (ref 78.0–100.0)
Platelets: 189 10*3/uL (ref 150–400)
RBC: 4.42 MIL/uL (ref 4.22–5.81)
RDW: 13.5 % (ref 11.5–15.5)
WBC: 13.5 10*3/uL — AB (ref 4.0–10.5)

## 2017-08-25 MED ORDER — CELECOXIB 100 MG PO CAPS
100.0000 mg | ORAL_CAPSULE | Freq: Two times a day (BID) | ORAL | Status: DC
  Administered 2017-08-25 – 2017-08-26 (×3): 100 mg via ORAL
  Filled 2017-08-25 (×3): qty 1

## 2017-08-25 NOTE — Progress Notes (Signed)
Physical Therapy Treatment Patient Details Name: Troy Acosta MRN: [id number] DOB: 1951-05-12 Today's Date: 08/25/2017    History of Present Illness 66 y.o. male s/p L TKA   PMH includes: R TKA, A Fib, Anxiety, HTN.     PT Comments    Pt is painful this PM, disappointed that he had to take more IV pain medication.  He was able to walk a good distance still down the hallway and do his HEP program, he is just not comfortable due to R knee pain this PM.  Family present and helpful.  PT will continue to follow acutely.     Follow Up Recommendations  Home health PT;DC plan and follow up therapy as arranged by surgeon     Equipment Recommendations  None recommended by PT    Recommendations for Other Services       Precautions / Restrictions Precautions Precautions: Knee;Fall Required Braces or Orthoses: Knee Immobilizer - Left Knee Immobilizer - Left: Other (comment)(in bed unless in CPM) Restrictions Weight Bearing Restrictions: Yes LLE Weight Bearing: Weight bearing as tolerated    Mobility  Bed Mobility Overal bed mobility: Modified Independent             General bed mobility comments: supine<>sit without phsyical assistance  Transfers Overall transfer level: Needs assistance Equipment used: Rolling walker (2 wheeled) Transfers: Sit to/from Stand Sit to Stand: Supervision         General transfer comment: supervision for safety  Ambulation/Gait Ambulation/Gait assistance: Min guard Ambulation Distance (Feet): 200 Feet Assistive device: Rolling walker (2 wheeled) Gait Pattern/deviations: Step-through pattern;Antalgic;Decreased weight shift to left     General Gait Details: Minimally antalgic gait pattern, min guard assist this afternoon due to pt in increased pain this PM.           Balance Overall balance assessment: Needs assistance Sitting-balance support: Feet unsupported;No upper extremity supported Sitting balance-Leahy Scale: Normal      Standing balance support: Bilateral upper extremity supported;During functional activity Standing balance-Leahy Scale: Fair                              Cognition Arousal/Alertness: Awake/alert Behavior During Therapy: WFL for tasks assessed/performed Overall Cognitive Status: Within Functional Limits for tasks assessed                                        Exercises Total Joint Exercises Ankle Circles/Pumps: AROM;Both;20 reps Quad Sets: AROM;Left;10 reps Towel Squeeze: AROM;Both;10 reps Heel Slides: AAROM;Left;10 reps Goniometric ROM: 10-80(R leg is 8-95)        Pertinent Vitals/Pain Pain Assessment: Faces Faces Pain Scale: Hurts little more Pain Location: L KNEE Pain Descriptors / Indicators: Guarding;Sore Pain Intervention(s): Limited activity within patient's tolerance;Monitored during session;Repositioned           PT Goals (current goals can now be found in the care plan section) Acute Rehab PT Goals Patient Stated Goal: to return home with HHPT PT Goal Formulation: With patient/family Time For Goal Achievement: 08/28/17 Potential to Achieve Goals: Good Progress towards PT goals: Progressing toward goals    Frequency    7X/week      PT Plan Current plan remains appropriate       AM-PAC PT "6 Clicks" Daily Activity  Outcome Measure  Difficulty turning over in bed (including adjusting bedclothes, sheets and blankets)?: A Little Difficulty  moving from lying on back to sitting on the side of the bed? : A Little Difficulty sitting down on and standing up from a chair with arms (e.g., wheelchair, bedside commode, etc,.)?: None Help needed moving to and from a bed to chair (including a wheelchair)?: None Help needed walking in hospital room?: A Little Help needed climbing 3-5 steps with a railing? : A Little 6 Click Score: 20    End of Session Equipment Utilized During Treatment: Gait belt Activity Tolerance: Patient  tolerated treatment well;No increased pain Patient left: with call bell/phone within reach;in chair;with family/visitor present Nurse Communication: Mobility status PT Visit Diagnosis: Unsteadiness on feet (R26.81);Other abnormalities of gait and mobility (R26.89);Repeated falls (R29.6)     Time: 7943-2761 PT Time Calculation (min) (ACUTE ONLY): 23 min  Charges:  $Gait Training: 8-22 mins $Therapeutic Exercise: 8-22 mins          Xoe Hoe B. Joyia Riehle, PT, DPT [phone]            08/25/2017, 4:10 PM

## 2017-08-25 NOTE — Progress Notes (Signed)
Physical Therapy Treatment Patient Details Name: Troy Acosta MRN: [id number] DOB: 07/08/1951 Today's Date: 08/25/2017    History of Present Illness 66 y.o. male s/p L TKA   PMH includes: R TKA, A Fib, Anxiety, HTN.     PT Comments    Patient is progressing well toward mobility goals. Pt tolerate gait and stair training well. Brother and sister present throughout session. Current plan remains appropriate.    Follow Up Recommendations  Home health PT;DC plan and follow up therapy as arranged by surgeon     Equipment Recommendations  None recommended by PT    Recommendations for Other Services       Precautions / Restrictions Precautions Precautions: Knee;Fall Precaution Comments: Reviewed knee precautions, no pillow under knee Restrictions Weight Bearing Restrictions: Yes LLE Weight Bearing: Weight bearing as tolerated    Mobility  Bed Mobility Overal bed mobility: Modified Independent                Transfers Overall transfer level: Needs assistance Equipment used: Rolling walker (2 wheeled) Transfers: Sit to/from Stand Sit to Stand: Supervision         General transfer comment: Pt demonstrating safe hand placement, supervision for safety and line managment.   Ambulation/Gait Ambulation/Gait assistance: Supervision Ambulation Distance (Feet): 200 Feet Assistive device: Rolling walker (2 wheeled) Gait Pattern/deviations: Step-through pattern;Antalgic;Decreased weight shift to left     General Gait Details: cues for posture and less weight bearing on bilat UE   Stairs Stairs: Yes   Stair Management: No rails;Step to pattern;Backwards;With walker Number of Stairs: 2 General stair comments: cues for sequencing and technique; assist to stabilize RW  Wheelchair Mobility    Modified Rankin (Stroke Patients Only)       Balance Overall balance assessment: Needs assistance Sitting-balance support: Feet unsupported;No upper extremity supported Sitting  balance-Leahy Scale: Normal     Standing balance support: Bilateral upper extremity supported;During functional activity Standing balance-Leahy Scale: Fair                              Cognition Arousal/Alertness: Awake/alert Behavior During Therapy: WFL for tasks assessed/performed Overall Cognitive Status: Within Functional Limits for tasks assessed                                        Exercises      General Comments        Pertinent Vitals/Pain Pain Assessment: Faces Faces Pain Scale: Hurts little more Pain Location: L KNEE Pain Descriptors / Indicators: Guarding;Sore Pain Intervention(s): Limited activity within patient's tolerance;Monitored during session;Premedicated before session;Repositioned    Home Living                      Prior Function            PT Goals (current goals can now be found in the care plan section) Acute Rehab PT Goals PT Goal Formulation: With patient/family Time For Goal Achievement: 08/28/17 Potential to Achieve Goals: Good Progress towards PT goals: Progressing toward goals    Frequency    7X/week      PT Plan Current plan remains appropriate    Co-evaluation              AM-PAC PT "6 Clicks" Daily Activity  Outcome Measure  Difficulty turning over in bed (including adjusting bedclothes, sheets  and blankets)?: A Little Difficulty moving from lying on back to sitting on the side of the bed? : A Little Difficulty sitting down on and standing up from a chair with arms (e.g., wheelchair, bedside commode, etc,.)?: A Little Help needed moving to and from a bed to chair (including a wheelchair)?: A Little Help needed walking in hospital room?: A Little Help needed climbing 3-5 steps with a railing? : A Little 6 Click Score: 18    End of Session Equipment Utilized During Treatment: Gait belt Activity Tolerance: Patient tolerated treatment well;No increased pain Patient left: with  call bell/phone within reach;in chair;with family/visitor present Nurse Communication: Mobility status PT Visit Diagnosis: Unsteadiness on feet (R26.81);Other abnormalities of gait and mobility (R26.89);Repeated falls (R29.6)     Time: 1610-9604 PT Time Calculation (min) (ACUTE ONLY): 20 min  Charges:  $Gait Training: 8-22 mins                    G Codes:       Earney Navy, PTA Pager: [phone]     Darliss Cheney 08/25/2017, 1:16 PM

## 2017-08-25 NOTE — Progress Notes (Signed)
OT Cancellation Note  Patient Details Name: Dequavion Follette MRN: [id number] DOB: 1950-12-08   Cancelled Treatment:    Reason Eval/Treat Not Completed: OT screened, no needs identified, will sign off. Spoke with pt and pt had recent knee surgery and agreeable to OT signing off.  Benito Mccreedy OTR/L 08/25/2017, 10:33 AM

## 2017-08-25 NOTE — Progress Notes (Signed)
Subjective: 1 Day Post-Op Procedure(s) (LRB): LEFT TOTAL KNEE ARTHROPLASTY (Left) Patient reports pain as moderate.  Using IV morphine and oral oxycodone.  Denies f/c/n/v. Wife at bedside.  Has not urinated yet since catheter removed at 0500.  Objective: Vital signs in last 24 hours: Temp:  [97.5 F (36.4 C)-99.3 F (37.4 C)] 98.9 F (37.2 C) (11/17 0451) Pulse Rate:  [51-96] 68 (11/17 0451) Resp:  [12-20] 16 (11/17 0451) BP: (93-128)/(56-85) 106/56 (11/17 0451) SpO2:  [92 %-99 %] 98 % (11/17 0451)  Intake/Output from previous day: 11/16 0701 - 11/17 0700 In: 2955.8 [P.O.:840; I.V.:1940.8; IV Piggyback:100] Out: 2375 [Urine:2275; Blood:100] Intake/Output this shift: No intake/output data recorded.  Recent Labs    08/25/17 0347  HGB 12.5*   Recent Labs    08/25/17 0347  WBC 13.5*  RBC 4.42  HCT 38.5*  PLT 189   Recent Labs    08/25/17 0347  NA 137  K 3.5  CL 102  CO2 27  BUN 18  CREATININE 0.98  GLUCOSE 138*  CALCIUM 8.4*   No results for input(s): LABPT, INR in the last 72 hours.  L knee dressed and dry.  NVI at L foot.  Assessment/Plan: 1 Day Post-Op Procedure(s) (LRB): LEFT TOTAL KNEE ARTHROPLASTY (Left) Up with therapy  Continue CPM per orders.  Hopefully home tomorrow.  Wean from morphine.  Troy Acosta, Troy Acosta 08/25/2017, 8:58 AM

## 2017-08-25 NOTE — Op Note (Signed)
NAMEMarland Kitchen  Troy Acosta, Troy Acosta NO.:  [id number]  MEDICAL RECORD NO.:  [id number]  LOCATION:  [facility]                         FACILITY:  [facility]  PHYSICIAN:  Doran Heater. Veverly Fells, M.D. DATE OF BIRTH:  20-Dec-1950  DATE OF PROCEDURE:  08/24/2017 DATE OF DISCHARGE:                              OPERATIVE REPORT   PREOPERATIVE DIAGNOSIS:  Left knee end-stage osteoarthritis.  POSTOPERATIVE DIAGNOSIS:  Left knee end-stage osteoarthritis.  PROCEDURE PERFORMED:  Left total knee arthroplasty using DePuy Sigma rotating platform prosthesis.  ATTENDING SURGEON:  Doran Heater. Veverly Fells, M.D.  ASSISTANT:  Abbott Pao. Dixon, P.A., who scrubbed the entire procedure and necessary for satisfactory completion of surgery.  ANESTHESIA:  Spinal anesthesia was used plus adductor canal block.  ESTIMATED BLOOD LOSS:  Minimal.  FLUID REPLACEMENT:  1500 mL crystalloid.  INSTRUMENT COUNTS:  Correct.  There were no complications.  Perioperative antibiotics were given.  INDICATIONS:  The patient is a 66 year old male with worsening left knee pain secondary to end-stage osteoarthritis.  The patient has failed all measures of conservative management and desires operative treatment to restore function and eliminate pain.  Informed consent was obtained.  DESCRIPTION OF PROCEDURE:  After an adequate level of anesthesia achieved, the patient was positioned supine on the operating room table. Left leg correctly identified.  A nonsterile tourniquet placed on the proximal thigh.  Left leg sterilely prepped and draped in usual manner. Time-out was called.  We elevated the leg and exsanguinated using the Esmarch bandage.  We elevated the tourniquet to 350 mmHg.  We then placed the knee in flexion.  A longitudinal midline incision was created with a #10 blade scalpel.  Dissection down through subcutaneous tissues. We used a fresh #10 blade for the medial parapatellar arthrotomy.  We then everted the patella, divided  the lateral patellofemoral ligaments, exposing the distal femur.  There was bone-on-bone arthritis noted.  We entered the distal femur with a step-cut drill.  We then placed intramedullary resection guide, resecting 10 mm, set on 5 degrees left with the oscillating saw.  We then sized the femur to size 5 anterior down and then performed our anterior, posterior, and chamfer cuts with the 4-in-1 block.  We removed the ACL, PCL, meniscal tissue, subluxing the tibia anteriorly and then performing our tibial cut.  We resected about 4 mm off the affected medial side with the oscillating saw perpendicular to the long axis of the tibia with minimal posterior slope for the posterior cruciate substituting prosthesis.  We irrigated thoroughly.  We then went ahead and checked our flexion and extension gaps, which were symmetric at 10 mm.  We removed excess posterior bone from the posterior femoral condyles with the lamina spreader and osteotome.  Next, we completed our tibial preparation with a modular drill and keel punch.  Next, we moved to the femur after placing our size 4 tibial component in place and marking our rotation.  We then went ahead and used the box cut guide to cut the box for the size 5 left femur.  Once we had the box cut done, we impacted our femoral component in place.  We reduced the knee with  a 10 mm polyethylene insert and had excellent stability and then went ahead and resurfaced our patella, going from 25 mm thickness down to 17, replacing with the 38 patellar button.  Once we drilled our lug holes and placed the trial patellar button in place, we ranged the knee.  Excellent patellar tracking with no-touch technique.  Removed all trial components, pulse irrigated the knee, used a drill bit to drill some hard areas on the patella and also on the medial tibia.  We then irrigated and dried the bone and then vacuum mixed DePuy high-viscosity cement on the back table and  then cemented the components into place, size 4 tibia, 5 left femur, and then the 10 mm poly spacer placed in place and then placed the knee in full extension while the cement hardened.  We also used a patellar clamp on the patellar button.  Once all the cement hardened, we removed excess cement with 0.25-inch curved osteotome and a tonsil.  We then went ahead and selected the real 12.5.  We felt we could get a slightly tighter fit in flexion with the 12.5 poly, so we selected real size 12.5 poly and placed that on the tibial tray.  Final inspection of posterior aspect of the knee joint for any debris and then reduced the knee, had a nice little snap medially, indicating nice tight medial side.  Ranged the knee, excellent patellar tracking and stability, and we were able to get full extension.  We irrigated thoroughly and then closed parapatellar arthrotomy with #1 Vicryl suture followed by 2-0 Vicryl subcutaneous closure and 4-0 Monocryl for skin.  Steri-Strips applied followed by sterile dressing.  The patient tolerated the surgery well.     Doran Heater. Veverly Fells, M.D.     SRN/MEDQ  D:  08/24/2017  T:  08/25/2017  Job:  [id number]

## 2017-08-25 NOTE — Progress Notes (Signed)
Pt states he wants his scheduled 0800 synthroid at 1130 because he did not receive it 30 minutes before his breakfast.

## 2017-08-26 LAB — CBC
HEMATOCRIT: 38.6 % — AB (ref 39.0–52.0)
Hemoglobin: 12.4 g/dL — ABNORMAL LOW (ref 13.0–17.0)
MCH: 28.1 pg (ref 26.0–34.0)
MCHC: 32.1 g/dL (ref 30.0–36.0)
MCV: 87.5 fL (ref 78.0–100.0)
PLATELETS: 164 10*3/uL (ref 150–400)
RBC: 4.41 MIL/uL (ref 4.22–5.81)
RDW: 13.6 % (ref 11.5–15.5)
WBC: 11.3 10*3/uL — ABNORMAL HIGH (ref 4.0–10.5)

## 2017-08-26 MED ORDER — CELECOXIB 100 MG PO CAPS: 100.0000 mg | ORAL_CAPSULE | Freq: Two times a day (BID) | ORAL | 0 refills | Status: DC

## 2017-08-26 MED ORDER — DOCUSATE SODIUM 100 MG PO CAPS: 100.0000 mg | ORAL_CAPSULE | Freq: Two times a day (BID) | ORAL | 0 refills | Status: DC

## 2017-08-26 MED ORDER — POLYETHYLENE GLYCOL 3350 17 G PO PACK: 17.0000 g | PACK | Freq: Every day | ORAL | 0 refills | Status: DC | PRN

## 2017-08-26 NOTE — Progress Notes (Signed)
Patient ID: Troy Acosta, male   DOB: 1951/04/27, 66 y.o.   MRN: [id number] Subjective: 2 Days Post-Op Procedure(s) (LRB): LEFT TOTAL KNEE ARTHROPLASTY (Left)    Patient reports pain as moderate.  No liquid Morphine since 10 pm last night, taking po oxycodone.  Reviewed post op course.  Has been through this before  Objective:   VITALS:   Vitals:   08/25/17 1454 08/25/17 2021  BP: 118/64 131/67  Pulse: 70 78  Resp: 16 16  Temp: 99 F (37.2 C) 98.7 F (37.1 C)  SpO2: 98% 96%    Neurovascular intact Incision: dressing C/D/I  LABS Recent Labs    08/25/17 0347 08/26/17 0431  HGB 12.5* 12.4*  HCT 38.5* 38.6*  WBC 13.5* 11.3*  PLT 189 164    Recent Labs    08/25/17 0347  NA 137  K 3.5  BUN 18  CREATININE 0.98  GLUCOSE 138*    No results for input(s): LABPT, INR in the last 72 hours.   Assessment/Plan: 2 Days Post-Op Procedure(s) (LRB): LEFT TOTAL KNEE ARTHROPLASTY (Left)   Advance diet Up with therapy  Home today after therapy likely Home PT, CPM per [facility] in 2 weeks Nursing to place aquacell dressing prior to discharge

## 2017-08-26 NOTE — Discharge Instructions (Signed)
Ice to the knee as much as possible.  Keep the incision clean and dry and covered for one week, then ok to get it wet in the shower.  Ok to put full weight on the left knee and leg.  Do not prop anything behind the knee, prop under the ankle to encourage full strengthening.  Do exercises every hour while awake for 5 minutes  Use the CPM in two hour increments - 0-90 degrees  Follow up in the office in two weeks [phone]  INSTRUCTIONS AFTER JOINT REPLACEMENT   o Remove items at home which could result in a fall. This includes throw rugs or furniture in walking pathways o ICE to the affected joint every three hours while awake for 30 minutes at a time, for at least the first 3-5 days, and then as needed for pain and swelling.  Continue to use ice for pain and swelling. You may notice swelling that will progress down to the foot and ankle.  This is normal after surgery.  Elevate your leg when you are not up walking on it.   o Continue to use the breathing machine you got in the hospital (incentive spirometer) which will help keep your temperature down.  It is common for your temperature to cycle up and down following surgery, especially at night when you are not up moving around and exerting yourself.  The breathing machine keeps your lungs expanded and your temperature down.   DIET:  As you were doing prior to hospitalization, we recommend a well-balanced diet.  DRESSING / WOUND CARE / SHOWERING  Keep the surgical dressing until follow up.  The dressing is water proof, so you can shower without any extra covering.  IF THE DRESSING FALLS OFF or the wound gets wet inside, change the dressing with sterile gauze.  Please use good hand washing techniques before changing the dressing.  Do not use any lotions or creams on the incision until instructed by your surgeon.    ACTIVITY  o Increase activity slowly as tolerated, but follow the weight bearing instructions below.   o No driving for 6  weeks or until further direction given by your physician.  You cannot drive while taking narcotics.  o No lifting or carrying greater than 10 lbs. until further directed by your surgeon. o Avoid periods of inactivity such as sitting longer than an hour when not asleep. This helps prevent blood clots.  o You may return to work once you are authorized by your doctor.     WEIGHT BEARING   Weight bearing as tolerated with assist device (walker, cane, etc) as directed, use it as long as suggested by your surgeon or therapist, typically at least 4-6 weeks.   EXERCISES  Results after joint replacement surgery are often greatly improved when you follow the exercise, range of motion and muscle strengthening exercises prescribed by your doctor. Safety measures are also important to protect the joint from further injury. Any time any of these exercises cause you to have increased pain or swelling, decrease what you are doing until you are comfortable again and then slowly increase them. If you have problems or questions, call your caregiver or physical therapist for advice.   Rehabilitation is important following a joint replacement. After just a few days of immobilization, the muscles of the leg can become weakened and shrink (atrophy).  These exercises are designed to build up the tone and strength of the thigh and leg muscles and to improve  motion. Often times heat used for twenty to thirty minutes before working out will loosen up your tissues and help with improving the range of motion but do not use heat for the first two weeks following surgery (sometimes heat can increase post-operative swelling).   These exercises can be done on a training (exercise) mat, on the floor, on a table or on a bed. Use whatever works the best and is most comfortable for you.    Use music or television while you are exercising so that the exercises are a pleasant break in your day. This will make your life better with the  exercises acting as a break in your routine that you can look forward to.   Perform all exercises about fifteen times, three times per day or as directed.  You should exercise both the operative leg and the other leg as well.  Exercises include:    Quad Sets - Tighten up the muscle on the front of the thigh (Quad) and hold for 5-10 seconds.    Straight Leg Raises - With your knee straight (if you were given a brace, keep it on), lift the leg to 60 degrees, hold for 3 seconds, and slowly lower the leg.  Perform this exercise against resistance later as your leg gets stronger.   Leg Slides: Lying on your back, slowly slide your foot toward your buttocks, bending your knee up off the floor (only go as far as is comfortable). Then slowly slide your foot back down until your leg is flat on the floor again.   Angel Wings: Lying on your back spread your legs to the side as far apart as you can without causing discomfort.   Hamstring Strength:  Lying on your back, push your heel against the floor with your leg straight by tightening up the muscles of your buttocks.  Repeat, but this time bend your knee to a comfortable angle, and push your heel against the floor.  You may put a pillow under the heel to make it more comfortable if necessary.   A rehabilitation program following joint replacement surgery can speed recovery and prevent re-injury in the future due to weakened muscles. Contact your doctor or a physical therapist for more information on knee rehabilitation.    CONSTIPATION  Constipation is defined medically as fewer than three stools per week and severe constipation as less than one stool per week.  Even if you have a regular bowel pattern at home, your normal regimen is likely to be disrupted due to multiple reasons following surgery.  Combination of anesthesia, postoperative narcotics, change in appetite and fluid intake all can affect your bowels.   YOU MUST use at least one of the  following options; they are listed in order of increasing strength to get the job done.  They are all available over the counter, and you may need to use some, POSSIBLY even all of these options:    Drink plenty of fluids (prune juice may be helpful) and high fiber foods Colace 100 mg by mouth twice a day  Senokot for constipation as directed and as needed Dulcolax (bisacodyl), take with full glass of water  Miralax (polyethylene glycol) once or twice a day as needed.  If you have tried all these things and are unable to have a bowel movement in the first 3-4 days after surgery call either your surgeon or your primary doctor.    If you experience loose stools or diarrhea, hold the medications until  you stool forms back up.  If your symptoms do not get better within 1 week or if they get worse, check with your doctor.  If you experience "the worst abdominal pain ever" or develop nausea or vomiting, please contact the office immediately for further recommendations for treatment.   ITCHING:  If you experience itching with your medications, try taking only a single pain pill, or even half a pain pill at a time.  You can also use Benadryl over the counter for itching or also to help with sleep.   TED HOSE STOCKINGS:  Use stockings on both legs until for at least 2 weeks or as directed by physician office. They may be removed at night for sleeping.  MEDICATIONS:  See your medication summary on the After Visit Summary that nursing will review with you.  You may have some home medications which will be placed on hold until you complete the course of blood thinner medication.  It is important for you to complete the blood thinner medication as prescribed.  PRECAUTIONS:  If you experience chest pain or shortness of breath - call 911 immediately for transfer to the hospital emergency department.   If you develop a fever greater that 101 F, purulent drainage from wound, increased redness or drainage from  wound, foul odor from the wound/dressing, or calf pain - CONTACT YOUR SURGEON.                                                   FOLLOW-UP APPOINTMENTS:  If you do not already have a post-op appointment, please call the office for an appointment to be seen by your surgeon.  Guidelines for how soon to be seen are listed in your After Visit Summary, but are typically between 1-4 weeks after surgery.  OTHER INSTRUCTIONS:   Knee Replacement:  Do not place pillow under knee, focus on keeping the knee straight while resting. CPM instructions: 0-90 degrees, 2 hours in the morning, 2 hours in the afternoon, and 2 hours in the evening. Place foam block, curve side up under heel at all times except when in CPM or when walking.  DO NOT modify, tear, cut, or change the foam block in any way.  MAKE SURE YOU:   Understand these instructions.   Get help right away if you are not doing well or get worse.    Thank you for letting us be a part of your medical care team.  It is a privilege we respect greatly.  We hope these instructions will help you stay on track for a fast and full recovery!

## 2017-08-26 NOTE — Progress Notes (Signed)
Physical Therapy Treatment Patient Details Name: Troy Acosta MRN: [id number] DOB: 08/08/1951 Today's Date: 08/26/2017    History of Present Illness 66 y.o. male s/p L TKA   PMH includes: R TKA, A Fib, Anxiety, HTN.     PT Comments    Continuing work on functional mobility and activity tolerance;  Troy Acosta is well versed in postop course, as he had his R knee replaced 6 months ago; Focused on L stance stability during walk; Stair training done yesterday; pt is without questions; OK for dc home from PT standpoint    Follow Up Recommendations  Home health PT;DC plan and follow up therapy as arranged by surgeon     Equipment Recommendations  None recommended by PT    Recommendations for Other Services       Precautions / Restrictions Precautions Precautions: Knee;Fall Precaution Booklet Issued: Yes (comment) Precaution Comments: Reviewed knee precautions, no pillow under knee Required Braces or Orthoses: Knee Immobilizer - Left Knee Immobilizer - Left: Other (comment)(in bed unless in CPM) Restrictions Weight Bearing Restrictions: Yes LLE Weight Bearing: Weight bearing as tolerated    Mobility  Bed Mobility Overal bed mobility: Modified Independent             General bed mobility comments: supine<>sit without phsyical assistance  Transfers Overall transfer level: Needs assistance Equipment used: Rolling walker (2 wheeled) Transfers: Sit to/from Stand Sit to Stand: Supervision         General transfer comment: supervision for safety; cues for hand placement and foot position to aid in standing from low surface  Ambulation/Gait Ambulation/Gait assistance: Min guard Ambulation Distance (Feet): 120 Feet Assistive device: Rolling walker (2 wheeled) Gait Pattern/deviations: Step-through pattern;Antalgic;Decreased weight shift to left     General Gait Details: Cues for upright posture, and to activate quad for increase stance stability   Stairs             Wheelchair Mobility    Modified Rankin (Stroke Patients Only)       Balance                                            Cognition Arousal/Alertness: Awake/alert Behavior During Therapy: WFL for tasks assessed/performed Overall Cognitive Status: Within Functional Limits for tasks assessed                                        Exercises Total Joint Exercises Quad Sets: AROM;Left;10 reps Heel Slides: AAROM;Left;5 reps Goniometric ROM: approx 6-80 degrees    General Comments        Pertinent Vitals/Pain Pain Assessment: 0-10 Pain Score: 5  Pain Location: L KNEE Pain Descriptors / Indicators: Guarding;Sore Pain Intervention(s): Monitored during session    Home Living                      Prior Function            PT Goals (current goals can now be found in the care plan section) Acute Rehab PT Goals Patient Stated Goal: to return home with HHPT PT Goal Formulation: With patient/family Time For Goal Achievement: 08/28/17 Potential to Achieve Goals: Good Progress towards PT goals: Progressing toward goals    Frequency    7X/week      PT Plan  Current plan remains appropriate    Co-evaluation              AM-PAC PT "6 Clicks" Daily Activity  Outcome Measure  Difficulty turning over in bed (including adjusting bedclothes, sheets and blankets)?: A Little Difficulty moving from lying on back to sitting on the side of the bed? : A Little Difficulty sitting down on and standing up from a chair with arms (e.g., wheelchair, bedside commode, etc,.)?: A Little Help needed moving to and from a bed to chair (including a wheelchair)?: None Help needed walking in hospital room?: None Help needed climbing 3-5 steps with a railing? : A Little 6 Click Score: 20    End of Session Equipment Utilized During Treatment: Gait belt Activity Tolerance: Patient tolerated treatment well;No increased pain Patient left: with  call bell/phone within reach;with family/visitor present(sitting on BSC by sink in bathroom to take sponge bath) Nurse Communication: Mobility status PT Visit Diagnosis: Unsteadiness on feet (R26.81);Other abnormalities of gait and mobility (R26.89);Repeated falls (R29.6)     Time: 3612-2449 PT Time Calculation (min) (ACUTE ONLY): 19 min  Charges:  $Gait Training: 8-22 mins                    G Codes:       Troy Acosta, [place]  Acute Rehabilitation Services Pager [phone] Office [place] 08/26/2017, 11:46 AM

## 2017-08-26 NOTE — Discharge Summary (Signed)
Pt given prescriptions and discharge instructions. Gone over with him and wife present, answered any questions to satisfaction. Pt has [facility] set up and will see him tomorrow. Has rest of equipment at home. Pt in no distress at discharge. Is going home via Wife.

## 2017-08-26 NOTE — Plan of Care (Signed)
  Completed/Met Education: Knowledge of General Education information will improve 08/26/2017 1107 - Completed/Met by Governor Rooks, RN

## 2017-08-27 ENCOUNTER — Encounter (HOSPITAL_COMMUNITY): Payer: Self-pay | Admitting: Orthopedic Surgery

## 2017-08-28 ENCOUNTER — Other Ambulatory Visit: Payer: Self-pay | Admitting: Family Medicine

## 2017-08-28 DIAGNOSIS — F419 Anxiety disorder, unspecified: Secondary | ICD-10-CM | POA: Diagnosis not present

## 2017-08-28 DIAGNOSIS — I10 Essential (primary) hypertension: Secondary | ICD-10-CM | POA: Diagnosis not present

## 2017-08-28 DIAGNOSIS — M316 Other giant cell arteritis: Secondary | ICD-10-CM | POA: Diagnosis not present

## 2017-08-28 DIAGNOSIS — M179 Osteoarthritis of knee, unspecified: Secondary | ICD-10-CM | POA: Diagnosis not present

## 2017-08-28 DIAGNOSIS — Z471 Aftercare following joint replacement surgery: Secondary | ICD-10-CM | POA: Diagnosis not present

## 2017-08-28 DIAGNOSIS — K449 Diaphragmatic hernia without obstruction or gangrene: Secondary | ICD-10-CM | POA: Diagnosis not present

## 2017-08-28 DIAGNOSIS — I499 Cardiac arrhythmia, unspecified: Secondary | ICD-10-CM | POA: Diagnosis not present

## 2017-08-28 DIAGNOSIS — N400 Enlarged prostate without lower urinary tract symptoms: Secondary | ICD-10-CM | POA: Diagnosis not present

## 2017-08-28 DIAGNOSIS — I429 Cardiomyopathy, unspecified: Secondary | ICD-10-CM | POA: Diagnosis not present

## 2017-08-28 DIAGNOSIS — M19079 Primary osteoarthritis, unspecified ankle and foot: Secondary | ICD-10-CM | POA: Diagnosis not present

## 2017-08-28 DIAGNOSIS — I480 Paroxysmal atrial fibrillation: Secondary | ICD-10-CM | POA: Diagnosis not present

## 2017-08-28 DIAGNOSIS — D649 Anemia, unspecified: Secondary | ICD-10-CM | POA: Diagnosis not present

## 2017-08-28 DIAGNOSIS — M19049 Primary osteoarthritis, unspecified hand: Secondary | ICD-10-CM | POA: Diagnosis not present

## 2017-08-28 NOTE — Telephone Encounter (Signed)
Last lipid 12/24/14

## 2017-08-28 NOTE — Telephone Encounter (Signed)
Patient has had lipid panels at work.  We have reviewed these and they are in the record.

## 2017-08-29 NOTE — Discharge Summary (Signed)
Physician Discharge Summary    Patient ID: Troy Acosta MRN: [id number] DOB/AGE: May 30, 1951 66 y.o.  Admit date: 08/24/2017 Discharge date: 08/26/2017   Procedures:  Procedure(s) (LRB): LEFT TOTAL KNEE ARTHROPLASTY (Left)  Attending Physician:  Dr. Esmond Plants  Admission Diagnoses:   Left knee end stage osteoarthritis  Consultants:  PT/OT  Discharge Diagnoses:  Active Problems:   S/P TKR (total knee replacement), left  Past Medical History:  Diagnosis Date  . Anxiety   . Arthritis    "all my joints" (08/24/2017)  . Diaphragmatic hernia without mention of obstruction or gangrene   . Dysrhythmia    afib, ablation- 12/2015, no problems since, off blood thinner- 09/2016  . Essential hypertension, benign   . Headache    had MRI- was consulted with Dr. Jannifer Franklin,, headache has resolved"probably related to caffeine"  . Hyperplasia of prostate   . Iron deficiency anemia, unspecified   . Malaise and fatigue   . Obesity   . Other and unspecified hyperlipidemia   . Paroxysmal atrial fibrillation (HCC)   . PONV (postoperative nausea and vomiting)   . Unspecified hypothyroidism   . Wears dentures      PCP: Chipper Herb, MD   Discharged Condition: good  Hospital Course:  Patient underwent the above stated procedure on 08/24/2017. Patient tolerated the procedure well and brought to the recovery room in good condition and subsequently to the floor. No complications during their hospital stay. No complicating wound issues during the hospital stay. Incision healing well and good early range of motion  Disposition: 01-Home or Self Care with follow up in 2 weeks  Medications: No current facility-administered medications for this encounter.    Current Outpatient Medications  Medication Sig Dispense Refill  . acetaminophen (TYLENOL) 500 MG tablet Take 500 mg by mouth 2 (two) times daily.    Marland Kitchen amLODipine (NORVASC) 5 MG tablet Take 1 tablet (5 mg total) by mouth daily. (Patient  taking differently: Take 5 mg by mouth daily at 12 noon. ) 180 tablet 3  . aspirin (ASPIRIN CHILDRENS) 81 MG chewable tablet Chew 1 tablet (81 mg total) by mouth 2 (two) times daily. (Patient taking differently: Chew 81 mg by mouth at bedtime. ) 60 tablet 0  . Cholecalciferol 2000 units CAPS Take 2,000 Units by mouth every evening.    . hydrochlorothiazide (HYDRODIURIL) 25 MG tablet Take 1 tablet (25 mg total) by mouth daily. 90 tablet 3  . iron polysaccharides (FERREX 150) 150 MG capsule Take 1 capsule (150 mg total) by mouth daily. (Patient taking differently: Take 150 mg by mouth at bedtime. ) 90 capsule 3  . levothyroxine (SYNTHROID, LEVOTHROID) 150 MCG tablet Take 1 tablet (150 mcg total) by mouth daily before breakfast. (Patient taking differently: Take 150 mcg by mouth daily before breakfast. Takes 150 mcg  and 12.5 mcg to equal 162.5 on Monday -Wednesday-Friday and 150 mcg and 25 mcg to equal 175 mcg all other days of the week) 90 tablet 3  . levothyroxine (SYNTHROID, LEVOTHROID) 25 MCG tablet TAKE ONE-HALF TABLET BY MOUTH ONCE DAILY BEFORE BREAKFAST (Patient taking differently: Take 12.5 mcg by mouth daily before breakfast. Takes 150 mcg and 12.5 mcg to equal 162.5 on Monday -Wednesday-Friday and 150 mcg and 25 mcg to equal 175 mcg all other days of the week) 45 tablet 3  . methocarbamol (ROBAXIN) 500 MG tablet Take 1 tablet (500 mg total) by mouth 3 (three) times daily as needed. (Patient taking differently: Take 500 mg by mouth 2 (  two) times daily. ) 60 tablet 1  . olmesartan (BENICAR) 40 MG tablet Take 0.5 tablets (20 mg total) by mouth 2 (two) times daily. 180 tablet 3  . aspirin (ASPIRIN CHILDRENS) 81 MG chewable tablet Chew 1 tablet (81 mg total) 2 (two) times daily by mouth. 60 tablet 0  . celecoxib (CELEBREX) 100 MG capsule Take 1 capsule (100 mg total) 2 (two) times daily by mouth. 60 capsule 0  . docusate sodium (COLACE) 100 MG capsule Take 1 capsule (100 mg total) 2 (two) times daily  by mouth. 10 capsule 0  . methocarbamol (ROBAXIN) 500 MG tablet Take 1 tablet (500 mg total) 3 (three) times daily as needed by mouth. 60 tablet 1  . oxyCODONE-acetaminophen (ROXICET) 5-325 MG tablet Take 1-2 tablets every 4 (four) hours as needed by mouth for severe pain. 40 tablet 0  . polyethylene glycol (MIRALAX / GLYCOLAX) packet Take 17 g daily as needed by mouth for mild constipation. 14 each 0  . rosuvastatin (CRESTOR) 5 MG tablet TAKE 1 TABLET BY MOUTH  DAILY 90 tablet 0   Facility-Administered Medications Ordered in Other Encounters  Medication Dose Route Frequency Provider Last Rate Last Dose  . gadopentetate dimeglumine (MAGNEVIST) injection 20 mL  20 mL Intravenous Once PRN Kathrynn Ducking, MD        Follow-up Information    [name], MD. Call in 2 week(s).   Specialty:  Orthopedic Surgery Why:  [phone] Contact information: [address] [place] [address] [phone]        [facility] Follow up.   Specialty:  [place] Why:  A representative from [facility] will contact you to arrange start date and time for your therapy. Contact information: [address] [place] [address] [phone]           Discharge Instructions    CPM   Complete by:  As directed    Continuous passive motion machine (CPM):      Use the CPM from 2 to 6 hours per day.      You may increase by 10 per day.  You may break it up into 2 or 3 sessions per day.      Use CPM for 2-3 weeks or until you are told to stop.   Call MD / Call 911   Complete by:  As directed    If you experience chest pain or shortness of breath, CALL 911 and be transported to the hospital emergency room.  If you develope a fever above 101 F, pus (white drainage) or increased drainage or redness at the wound, or calf pain, call your surgeon's office.   Constipation Prevention   Complete by:  As directed    Drink plenty of fluids.  Prune juice may  be helpful.  You may use a stool softener, such as Colace (over the counter) 100 mg twice a day.  Use MiraLax (over the counter) for constipation as needed.   Diet - low sodium heart healthy   Complete by:  As directed    Increase activity slowly as tolerated   Complete by:  As directed       Allergies as of 08/26/2017   No Known Allergies     Medication List    STOP taking these medications   LORazepam 0.5 MG tablet Commonly known as:  ATIVAN   metoprolol tartrate 25 MG tablet Commonly known as:  [employer]  TAKE these medications   acetaminophen 500 MG tablet Commonly known as:  TYLENOL Take 500 mg by mouth 2 (two) times daily. Notes to patient:  Had dose this morning, next dose will be this evening   amLODipine 5 MG tablet Commonly known as:  NORVASC Take 1 tablet (5 mg total) by mouth daily. What changed:  when to take this   aspirin 81 MG chewable tablet Commonly known as:  ASPIRIN CHILDRENS Chew 1 tablet (81 mg total) by mouth 2 (two) times daily. What changed:  when to take this Notes to patient:  Had dose this morning, next dose will be this evening   aspirin 81 MG chewable tablet Commonly known as:  ASPIRIN CHILDRENS Chew 1 tablet (81 mg total) 2 (two) times daily by mouth. What changed:  You were already taking a medication with the same name, and this prescription was added. Make sure you understand how and when to take each.   celecoxib 100 MG capsule Commonly known as:  CELEBREX Take 1 capsule (100 mg total) 2 (two) times daily by mouth. Notes to patient:  Had dose this morning, next dose will be this evening   Cholecalciferol 2000 units Caps Take 2,000 Units by mouth every evening.   docusate sodium 100 MG capsule Commonly known as:  COLACE Take 1 capsule (100 mg total) 2 (two) times daily by mouth. Notes to patient:  Had dose this morning, next dose will be this evening   hydrochlorothiazide 25 MG tablet Commonly known as:  HYDRODIURIL Take  1 tablet (25 mg total) by mouth daily. Notes to patient:  Had dose this morning   iron polysaccharides 150 MG capsule Commonly known as:  FERREX 150 Take 1 capsule (150 mg total) by mouth daily. What changed:  when to take this Notes to patient:  Had dose this morning   levothyroxine 25 MCG tablet Commonly known as:  SYNTHROID, LEVOTHROID TAKE ONE-HALF TABLET BY MOUTH ONCE DAILY BEFORE BREAKFAST What changed:    how much to take  how to take this  when to take this  additional instructions Notes to patient:  Had dose this morning   levothyroxine 150 MCG tablet Commonly known as:  SYNTHROID, LEVOTHROID Take 1 tablet (150 mcg total) by mouth daily before breakfast. What changed:  additional instructions Notes to patient:  Had dose this morning   methocarbamol 500 MG tablet Commonly known as:  ROBAXIN Take 1 tablet (500 mg total) by mouth 3 (three) times daily as needed. What changed:  when to take this Notes to patient:  Had dose at 12:30, can take again 2 more times today   methocarbamol 500 MG tablet Commonly known as:  ROBAXIN Take 1 tablet (500 mg total) 3 (three) times daily as needed by mouth. What changed:  You were already taking a medication with the same name, and this prescription was added. Make sure you understand how and when to take each.   olmesartan 40 MG tablet Commonly known as:  BENICAR Take 0.5 tablets (20 mg total) by mouth 2 (two) times daily.   oxyCODONE-acetaminophen 5-325 MG tablet Commonly known as:  ROXICET Take 1-2 tablets every 4 (four) hours as needed by mouth for severe pain. Notes to patient:  Had dose at 12:30, can have again after 4:30pm   polyethylene glycol packet Commonly known as:  MIRALAX / GLYCOLAX Take 17 g daily as needed by mouth for mild constipation.       Signed: Ventura Bruns 08/29/2017, 1:31  PM

## 2017-08-31 DIAGNOSIS — I480 Paroxysmal atrial fibrillation: Secondary | ICD-10-CM | POA: Diagnosis not present

## 2017-08-31 DIAGNOSIS — N400 Enlarged prostate without lower urinary tract symptoms: Secondary | ICD-10-CM | POA: Diagnosis not present

## 2017-08-31 DIAGNOSIS — Z471 Aftercare following joint replacement surgery: Secondary | ICD-10-CM | POA: Diagnosis not present

## 2017-08-31 DIAGNOSIS — I499 Cardiac arrhythmia, unspecified: Secondary | ICD-10-CM | POA: Diagnosis not present

## 2017-08-31 DIAGNOSIS — F419 Anxiety disorder, unspecified: Secondary | ICD-10-CM | POA: Diagnosis not present

## 2017-08-31 DIAGNOSIS — I429 Cardiomyopathy, unspecified: Secondary | ICD-10-CM | POA: Diagnosis not present

## 2017-08-31 DIAGNOSIS — M19079 Primary osteoarthritis, unspecified ankle and foot: Secondary | ICD-10-CM | POA: Diagnosis not present

## 2017-08-31 DIAGNOSIS — K449 Diaphragmatic hernia without obstruction or gangrene: Secondary | ICD-10-CM | POA: Diagnosis not present

## 2017-08-31 DIAGNOSIS — I10 Essential (primary) hypertension: Secondary | ICD-10-CM | POA: Diagnosis not present

## 2017-08-31 DIAGNOSIS — M19049 Primary osteoarthritis, unspecified hand: Secondary | ICD-10-CM | POA: Diagnosis not present

## 2017-08-31 DIAGNOSIS — D649 Anemia, unspecified: Secondary | ICD-10-CM | POA: Diagnosis not present

## 2017-08-31 DIAGNOSIS — M179 Osteoarthritis of knee, unspecified: Secondary | ICD-10-CM | POA: Diagnosis not present

## 2017-08-31 DIAGNOSIS — M316 Other giant cell arteritis: Secondary | ICD-10-CM | POA: Diagnosis not present

## 2017-09-03 DIAGNOSIS — D649 Anemia, unspecified: Secondary | ICD-10-CM | POA: Diagnosis not present

## 2017-09-03 DIAGNOSIS — I499 Cardiac arrhythmia, unspecified: Secondary | ICD-10-CM | POA: Diagnosis not present

## 2017-09-03 DIAGNOSIS — M316 Other giant cell arteritis: Secondary | ICD-10-CM | POA: Diagnosis not present

## 2017-09-03 DIAGNOSIS — M19049 Primary osteoarthritis, unspecified hand: Secondary | ICD-10-CM | POA: Diagnosis not present

## 2017-09-03 DIAGNOSIS — M179 Osteoarthritis of knee, unspecified: Secondary | ICD-10-CM | POA: Diagnosis not present

## 2017-09-03 DIAGNOSIS — Z471 Aftercare following joint replacement surgery: Secondary | ICD-10-CM | POA: Diagnosis not present

## 2017-09-03 DIAGNOSIS — I10 Essential (primary) hypertension: Secondary | ICD-10-CM | POA: Diagnosis not present

## 2017-09-03 DIAGNOSIS — M19079 Primary osteoarthritis, unspecified ankle and foot: Secondary | ICD-10-CM | POA: Diagnosis not present

## 2017-09-03 DIAGNOSIS — N400 Enlarged prostate without lower urinary tract symptoms: Secondary | ICD-10-CM | POA: Diagnosis not present

## 2017-09-03 DIAGNOSIS — I429 Cardiomyopathy, unspecified: Secondary | ICD-10-CM | POA: Diagnosis not present

## 2017-09-03 DIAGNOSIS — F419 Anxiety disorder, unspecified: Secondary | ICD-10-CM | POA: Diagnosis not present

## 2017-09-03 DIAGNOSIS — K449 Diaphragmatic hernia without obstruction or gangrene: Secondary | ICD-10-CM | POA: Diagnosis not present

## 2017-09-03 DIAGNOSIS — I480 Paroxysmal atrial fibrillation: Secondary | ICD-10-CM | POA: Diagnosis not present

## 2017-09-05 DIAGNOSIS — I10 Essential (primary) hypertension: Secondary | ICD-10-CM | POA: Diagnosis not present

## 2017-09-05 DIAGNOSIS — I499 Cardiac arrhythmia, unspecified: Secondary | ICD-10-CM | POA: Diagnosis not present

## 2017-09-05 DIAGNOSIS — M19079 Primary osteoarthritis, unspecified ankle and foot: Secondary | ICD-10-CM | POA: Diagnosis not present

## 2017-09-05 DIAGNOSIS — F419 Anxiety disorder, unspecified: Secondary | ICD-10-CM | POA: Diagnosis not present

## 2017-09-05 DIAGNOSIS — D649 Anemia, unspecified: Secondary | ICD-10-CM | POA: Diagnosis not present

## 2017-09-05 DIAGNOSIS — M179 Osteoarthritis of knee, unspecified: Secondary | ICD-10-CM | POA: Diagnosis not present

## 2017-09-05 DIAGNOSIS — M19049 Primary osteoarthritis, unspecified hand: Secondary | ICD-10-CM | POA: Diagnosis not present

## 2017-09-05 DIAGNOSIS — K449 Diaphragmatic hernia without obstruction or gangrene: Secondary | ICD-10-CM | POA: Diagnosis not present

## 2017-09-05 DIAGNOSIS — Z471 Aftercare following joint replacement surgery: Secondary | ICD-10-CM | POA: Diagnosis not present

## 2017-09-05 DIAGNOSIS — I429 Cardiomyopathy, unspecified: Secondary | ICD-10-CM | POA: Diagnosis not present

## 2017-09-05 DIAGNOSIS — I480 Paroxysmal atrial fibrillation: Secondary | ICD-10-CM | POA: Diagnosis not present

## 2017-09-05 DIAGNOSIS — N400 Enlarged prostate without lower urinary tract symptoms: Secondary | ICD-10-CM | POA: Diagnosis not present

## 2017-09-05 DIAGNOSIS — M316 Other giant cell arteritis: Secondary | ICD-10-CM | POA: Diagnosis not present

## 2017-09-07 DIAGNOSIS — K449 Diaphragmatic hernia without obstruction or gangrene: Secondary | ICD-10-CM | POA: Diagnosis not present

## 2017-09-07 DIAGNOSIS — Z471 Aftercare following joint replacement surgery: Secondary | ICD-10-CM | POA: Diagnosis not present

## 2017-09-07 DIAGNOSIS — M179 Osteoarthritis of knee, unspecified: Secondary | ICD-10-CM | POA: Diagnosis not present

## 2017-09-07 DIAGNOSIS — H2513 Age-related nuclear cataract, bilateral: Secondary | ICD-10-CM | POA: Diagnosis not present

## 2017-09-07 DIAGNOSIS — M316 Other giant cell arteritis: Secondary | ICD-10-CM | POA: Diagnosis not present

## 2017-09-07 DIAGNOSIS — I480 Paroxysmal atrial fibrillation: Secondary | ICD-10-CM | POA: Diagnosis not present

## 2017-09-07 DIAGNOSIS — H11153 Pinguecula, bilateral: Secondary | ICD-10-CM | POA: Diagnosis not present

## 2017-09-07 DIAGNOSIS — D649 Anemia, unspecified: Secondary | ICD-10-CM | POA: Diagnosis not present

## 2017-09-07 DIAGNOSIS — M19049 Primary osteoarthritis, unspecified hand: Secondary | ICD-10-CM | POA: Diagnosis not present

## 2017-09-07 DIAGNOSIS — F419 Anxiety disorder, unspecified: Secondary | ICD-10-CM | POA: Diagnosis not present

## 2017-09-07 DIAGNOSIS — M19079 Primary osteoarthritis, unspecified ankle and foot: Secondary | ICD-10-CM | POA: Diagnosis not present

## 2017-09-07 DIAGNOSIS — I429 Cardiomyopathy, unspecified: Secondary | ICD-10-CM | POA: Diagnosis not present

## 2017-09-07 DIAGNOSIS — H35362 Drusen (degenerative) of macula, left eye: Secondary | ICD-10-CM | POA: Diagnosis not present

## 2017-09-07 DIAGNOSIS — I499 Cardiac arrhythmia, unspecified: Secondary | ICD-10-CM | POA: Diagnosis not present

## 2017-09-07 DIAGNOSIS — I10 Essential (primary) hypertension: Secondary | ICD-10-CM | POA: Diagnosis not present

## 2017-09-07 DIAGNOSIS — H40013 Open angle with borderline findings, low risk, bilateral: Secondary | ICD-10-CM | POA: Diagnosis not present

## 2017-09-07 DIAGNOSIS — N400 Enlarged prostate without lower urinary tract symptoms: Secondary | ICD-10-CM | POA: Diagnosis not present

## 2017-09-11 DIAGNOSIS — Z471 Aftercare following joint replacement surgery: Secondary | ICD-10-CM | POA: Diagnosis not present

## 2017-09-11 DIAGNOSIS — Z96652 Presence of left artificial knee joint: Secondary | ICD-10-CM | POA: Diagnosis not present

## 2017-09-12 ENCOUNTER — Ambulatory Visit: Payer: BLUE CROSS/BLUE SHIELD | Attending: Orthopedic Surgery | Admitting: Physical Therapy

## 2017-09-12 DIAGNOSIS — M25561 Pain in right knee: Secondary | ICD-10-CM | POA: Diagnosis not present

## 2017-09-12 DIAGNOSIS — M25661 Stiffness of right knee, not elsewhere classified: Secondary | ICD-10-CM | POA: Insufficient documentation

## 2017-09-12 DIAGNOSIS — R600 Localized edema: Secondary | ICD-10-CM | POA: Insufficient documentation

## 2017-09-12 DIAGNOSIS — M25562 Pain in left knee: Secondary | ICD-10-CM | POA: Diagnosis not present

## 2017-09-12 DIAGNOSIS — G8929 Other chronic pain: Secondary | ICD-10-CM | POA: Diagnosis not present

## 2017-09-12 DIAGNOSIS — M25662 Stiffness of left knee, not elsewhere classified: Secondary | ICD-10-CM | POA: Diagnosis not present

## 2017-09-12 NOTE — Therapy (Signed)
[facility] [place], [place], [address] Phone: [phone]   Fax:  [phone]  Physical Therapy Evaluation  Patient Details  Name: Troy Acosta MRN: [id number] Date of Birth: [birth year] Referring Provider: Esmond Plants MD   Encounter Date: 09/12/2017  PT End of Session - 09/12/17 1224    Visit Number  1    Number of Visits  12    Date for PT Re-Evaluation  10/24/17    PT Start Time  1120    PT Stop Time  1215    PT Time Calculation (min)  55 min    Activity Tolerance  Patient tolerated treatment well;No increased pain    Behavior During Therapy  WFL for tasks assessed/performed       Past Medical History:  Diagnosis Date  . Anxiety   . Arthritis    "all my joints" (08/24/2017)  . Diaphragmatic hernia without mention of obstruction or gangrene   . Dysrhythmia    afib, ablation- 12/2015, no problems since, off blood thinner- 09/2016  . Essential hypertension, benign   . Headache    had MRI- was consulted with Dr. Jannifer Franklin,, headache has resolved"probably related to caffeine"  . Hyperplasia of prostate   . Iron deficiency anemia, unspecified   . Malaise and fatigue   . Obesity   . Other and unspecified hyperlipidemia   . Paroxysmal atrial fibrillation (HCC)   . PONV (postoperative nausea and vomiting)   . Unspecified hypothyroidism   . Wears dentures     Past Surgical History:  Procedure Laterality Date  . COLONOSCOPY WITH ESOPHAGOGASTRODUODENOSCOPY (EGD)    . ELECTROPHYSIOLOGIC STUDY N/A 12/24/2015   Afib ablation by Dr Rayann Heman  . JOINT REPLACEMENT    . KNEE ARTHROSCOPY Right ~ 2016  . LUNG BIOPSY  1990s   "no problems identified"  . MULTIPLE TOOTH EXTRACTIONS    . TOTAL KNEE ARTHROPLASTY Right 03/02/2017   Procedure: RIGHT TOTAL KNEE ARTHROPLASTY;  Surgeon: [name], MD;  Location: [facility];  Service: Orthopedics;  Laterality: Right;  . TOTAL KNEE ARTHROPLASTY Left 08/24/2017  . TOTAL KNEE ARTHROPLASTY  Left 08/24/2017   Procedure: LEFT TOTAL KNEE ARTHROPLASTY;  Surgeon: [name], MD;  Location: [facility];  Service: Orthopedics;  Laterality: Left;    There were no vitals filed for this visit.   Subjective Assessment - 09/12/17 1227    Subjective  The patient underwent a left total knee replacement on 08/24/17.  He had HH PT and is compliant to his HEP.  His pain is 6/10 today increasing with ROM.  Pain meds decrease his pain.      Pertinent History  Right total knee replacement.    How long can you walk comfortably?  Short community distances.  He is not driving yet.    Currently in Pain?  Yes    Pain Score  6     Pain Location  Knee    Pain Orientation  Left    Pain Descriptors / Indicators  Aching    Pain Type  Surgical pain    Pain Onset  1 to 4 weeks ago         [facility] PT Assessment - 09/12/17 0001      Assessment   Medical Diagnosis  Left total knee replacement.    Referring Provider  Esmond Plants MD    Onset Date/Surgical Date  -- 08/24/17 (surgery date).      Precautions   Precautions  -- No ultrasound.  Restrictions   Weight Bearing Restrictions  No      Home Environment   Living Environment  Private residence      Prior Function   Level of Independence  Independent      Observation/Other Assessments   Observations  Left knee anterior incision looks to be healing well with some decrease in mobiliyt.    Focus on Therapeutic Outcomes (FOTO)   57% limitation.      Observation/Other Assessments-Edema    Edema  Circumferential      Circumferential Edema   Circumferential - Right  LT 1 cm > RT.      AROM   Overall AROM Comments  Left knee active extension= -15 degrees and -10 degrees passive with active flexion to 83 degrees and 88 degrees pasively.      Strength   Overall Strength Comments  Left hip and knee strength= 4+/5.      Palpation   Patella mobility  -- Normal.    Palpation comment  Tender around left patella.      Ambulation/Gait   Gait  Comments  Antalgic gait wiht decreased stance time over left LE.             Objective measurements completed on examination: See above findings.      [facility] Adult PT Treatment/Exercise - 09/12/17 0001      Exercises   Exercises  Knee/Hip      Knee/Hip Exercises: Aerobic   Nustep  Level 5 x 10 minutes.      Modalities   Modalities  Electrical engineer Stimulation Location  Left knee.    Electrical Stimulation Action  IFC    Electrical Stimulation Parameters  1-10 Hz x 15 minutes.    Electrical Stimulation Goals  Edema;Pain      Vasopneumatic   Number Minutes Vasopneumatic   15 minutes    Vasopnuematic Location   -- Left knee.    Vasopneumatic Pressure  Medium               PT Short Term Goals - 09/12/17 1243      PT SHORT TERM GOAL #1   Title  STG's=LTG's.        PT Long Term Goals - 09/12/17 1244      PT LONG TERM GOAL #1   Title  Independent with a HEP.    Time  6    Period  Weeks    Status  New      PT LONG TERM GOAL #2   Title  Full active left knee extension in order to normalize gait.    Time  6    Period  Weeks    Status  New      PT LONG TERM GOAL #3   Title  Active left knee flexion to 115 degrees+ so the patient can perform functional tasks and do so with pain not > 2-3/10.    Time  6    Period  Weeks    Status  New      PT LONG TERM GOAL #4   Title  Increase left knee strength to a solid 5/5 to provide good stability for accomplishment of functional activities.    Time  6    Period  Weeks    Status  New      PT LONG TERM GOAL #5   Title  Perform a reciprocating stair gait with one railing.  Time  6    Period  Weeks    Status  New             Plan - 09/12/17 1240    Clinical Impression Statement  The patient presents to OPPT s/p left total knee replacement performed on 08/24/17.  He has a loss of left knee flexion and extension.  His edema is minimal today.  Functional  mobiliyt is impaired currently.  Patient will benefit from skilled physical therapy.    History and Personal Factors relevant to plan of care:  Right total knee replacement.    Clinical Presentation  Stable    Clinical Decision Making  Low    Rehab Potential  Excellent    PT Frequency  2x / week    PT Duration  6 weeks    PT Treatment/Interventions  ADLs/Self Care Home Management;Cryotherapy;Electrical Stimulation;Gait training;Stair training;Functional mobility training;Therapeutic activities;Therapeutic exercise;Neuromuscular re-education;Patient/family education;Passive range of motion;Manual techniques;Vasopneumatic Device    PT Next Visit Plan  Total knee replacement protocol.  PROM.  Progress to PRE's.  Electrical stimulation and vasopneumatic.    Consulted and Agree with Plan of Care  Patient       Patient will benefit from skilled therapeutic intervention in order to improve the following deficits and impairments:  Abnormal gait, Decreased activity tolerance, Decreased mobility, Decreased range of motion, Increased edema, Decreased strength, Pain  Visit Diagnosis: Chronic pain of left knee - Plan: PT plan of care cert/re-cert  Stiffness of left knee, not elsewhere classified - Plan: PT plan of care cert/re-cert  Localized edema - Plan: PT plan of care cert/re-cert  G-Codes - 99/35/70 1239    Functional Assessment Tool Used (Outpatient Only)  FOTO....57% limitation.    Functional Limitation  Mobility: Walking and moving around    Mobility: Walking and Moving Around Current Status [phone])  At least 40 percent but less than 60 percent impaired, limited or restricted    Mobility: Walking and Moving Around Goal Status [phone])  At least 1 percent but less than 20 percent impaired, limited or restricted        Problem List Patient Active Problem List   Diagnosis Date Noted  . S/P TKR (total knee replacement), left 08/24/2017  . Status post total knee replacement, right 03/02/2017   . Chronic intractable headache 08/09/2016  . Other specified hypothyroidism 03/02/2016  . A-fib ([place]) 12/24/2015  . Non-ischemic cardiomyopathy ([place]) 11/07/2015  . Long term (current) use of anticoagulants 11/07/2015  . Diaphoresis 11/01/2015  . Chest pain 11/01/2015  . PAF (paroxysmal atrial fibrillation) ([place]) 07/15/2015  . Heart palpitations 07/11/2015  . Dyspnea 02/18/2015  . Anemia, iron deficiency 09/10/2013  . Diaphragmatic hernia without mention of obstruction or gangrene   . Hypothyroidism   . Malaise and fatigue   . Arteritis, unspecified ([place])   . Iron deficiency anemia, unspecified   . Obesity   . BPH (benign prostatic hyperplasia)   . Essential hypertension, benign   . Hyperlipidemia     Tychelle Purkey, [place] MPT 09/12/2017, 12:51 PM  [facility] [address] [place], [place], [address] Phone: [phone]   Fax:  [phone]  Name: Gurley Climer MRN: [id number] Date of Birth: 11/17/1950

## 2017-09-13 ENCOUNTER — Other Ambulatory Visit: Payer: Self-pay | Admitting: Family Medicine

## 2017-09-13 DIAGNOSIS — I10 Essential (primary) hypertension: Secondary | ICD-10-CM

## 2017-09-14 ENCOUNTER — Encounter: Payer: Self-pay | Admitting: Internal Medicine

## 2017-09-14 ENCOUNTER — Ambulatory Visit (INDEPENDENT_AMBULATORY_CARE_PROVIDER_SITE_OTHER): Payer: BLUE CROSS/BLUE SHIELD | Admitting: Internal Medicine

## 2017-09-14 ENCOUNTER — Ambulatory Visit: Payer: BLUE CROSS/BLUE SHIELD | Admitting: *Deleted

## 2017-09-14 VITALS — BP 133/86 | HR 72 | Ht 71.0 in | Wt 246.6 lb

## 2017-09-14 DIAGNOSIS — M25662 Stiffness of left knee, not elsewhere classified: Secondary | ICD-10-CM

## 2017-09-14 DIAGNOSIS — M25562 Pain in left knee: Principal | ICD-10-CM

## 2017-09-14 DIAGNOSIS — M25561 Pain in right knee: Secondary | ICD-10-CM

## 2017-09-14 DIAGNOSIS — I480 Paroxysmal atrial fibrillation: Secondary | ICD-10-CM

## 2017-09-14 DIAGNOSIS — R600 Localized edema: Secondary | ICD-10-CM

## 2017-09-14 DIAGNOSIS — M25661 Stiffness of right knee, not elsewhere classified: Secondary | ICD-10-CM

## 2017-09-14 DIAGNOSIS — G8929 Other chronic pain: Secondary | ICD-10-CM

## 2017-09-14 NOTE — Therapy (Signed)
[facility] [place], [place], [address] Phone: [phone]   Fax:  [phone]  Physical Therapy Treatment  Patient Details  Name: Montey Ebel MRN: [id number] Date of Birth: 1951/10/09 Referring Provider: Esmond Plants MD   Encounter Date: 09/14/2017  PT End of Session - 09/14/17 1119    Visit Number  2    Number of Visits  12    Date for PT Re-Evaluation  10/24/17    PT Start Time  1115    PT Stop Time  1214    PT Time Calculation (min)  59 min       Past Medical History:  Diagnosis Date  . Anxiety   . Arthritis    "all my joints" (08/24/2017)  . Diaphragmatic hernia without mention of obstruction or gangrene   . Dysrhythmia    afib, ablation- 12/2015, no problems since, off blood thinner- 09/2016  . Essential hypertension, benign   . Headache    had MRI- was consulted with Dr. Jannifer Franklin,, headache has resolved"probably related to caffeine"  . Hyperplasia of prostate   . Iron deficiency anemia, unspecified   . Malaise and fatigue   . Obesity   . Other and unspecified hyperlipidemia   . Paroxysmal atrial fibrillation (HCC)   . PONV (postoperative nausea and vomiting)   . Unspecified hypothyroidism   . Wears dentures     Past Surgical History:  Procedure Laterality Date  . COLONOSCOPY WITH ESOPHAGOGASTRODUODENOSCOPY (EGD)    . ELECTROPHYSIOLOGIC STUDY N/A 12/24/2015   Afib ablation by Dr Rayann Heman  . JOINT REPLACEMENT    . KNEE ARTHROSCOPY Right ~ 2016  . LUNG BIOPSY  1990s   "no problems identified"  . MULTIPLE TOOTH EXTRACTIONS    . TOTAL KNEE ARTHROPLASTY Right 03/02/2017   Procedure: RIGHT TOTAL KNEE ARTHROPLASTY;  Surgeon: [name], MD;  Location: [facility];  Service: Orthopedics;  Laterality: Right;  . TOTAL KNEE ARTHROPLASTY Left 08/24/2017  . TOTAL KNEE ARTHROPLASTY Left 08/24/2017   Procedure: LEFT TOTAL KNEE ARTHROPLASTY;  Surgeon: [name], MD;  Location: [facility];  Service: Orthopedics;   Laterality: Left;    There were no vitals filed for this visit.  Subjective Assessment - 09/14/17 1118    Subjective  The patient underwent a left total knee replacement on 08/24/17.  He had HH PT and is compliant to his HEP.  His pain is 6/10 today increasing with ROM.  Pain meds decrease his pain.      Pertinent History  Right total knee replacement.    Limitations  Walking    How long can you sit comfortably?  2 hours    How long can you stand comfortably?  2 hours    How long can you walk comfortably?  Short community distances.  He is not driving yet.    Diagnostic tests  X-ray     Patient Stated Goals  2hrs    Currently in Pain?  Yes    Pain Score  5     Pain Location  Knee    Pain Orientation  Left    Pain Descriptors / Indicators  Aching    Pain Type  Surgical pain    Pain Onset  1 to 4 weeks ago                                PT Short Term Goals - 09/12/17 1243  PT SHORT TERM GOAL #1   Title  STG's=LTG's.        PT Long Term Goals - 09/12/17 1244      PT LONG TERM GOAL #1   Title  Independent with a HEP.    Time  6    Period  Weeks    Status  New      PT LONG TERM GOAL #2   Title  Full active left knee extension in order to normalize gait.    Time  6    Period  Weeks    Status  New      PT LONG TERM GOAL #3   Title  Active left knee flexion to 115 degrees+ so the patient can perform functional tasks and do so with pain not > 2-3/10.    Time  6    Period  Weeks    Status  New      PT LONG TERM GOAL #4   Title  Increase left knee strength to a solid 5/5 to provide good stability for accomplishment of functional activities.    Time  6    Period  Weeks    Status  New      PT LONG TERM GOAL #5   Title  Perform a reciprocating stair gait with one railing.    Time  6    Period  Weeks    Status  New            Plan - 09/14/17 1123    Clinical Impression Statement  Pt arrived today doing fairly well with LT TKR. He  was able to perform all therex today and did well. Rx focused on flexion and Extension ROM. Normal response to modalities.    Clinical Presentation  Stable    Clinical Decision Making  Low    Rehab Potential  Excellent    PT Frequency  2x / week    PT Duration  6 weeks    PT Treatment/Interventions  ADLs/Self Care Home Management;Cryotherapy;Electrical Stimulation;Gait training;Stair training;Functional mobility training;Therapeutic activities;Therapeutic exercise;Neuromuscular re-education;Patient/family education;Passive range of motion;Manual techniques;Vasopneumatic Device    PT Next Visit Plan  Total knee replacement protocol.  PROM.  Progress to PRE's.  Electrical stimulation and vasopneumatic.    PT Home Exercise Plan  Quad sets, hamstring stretching, (HEP provided by HHPT with standing hip exercises)    Consulted and Agree with Plan of Care  Patient       Patient will benefit from skilled therapeutic intervention in order to improve the following deficits and impairments:  Abnormal gait, Decreased activity tolerance, Decreased mobility, Decreased range of motion, Increased edema, Decreased strength, Pain  Visit Diagnosis: Chronic pain of left knee  Stiffness of left knee, not elsewhere classified  Localized edema  Stiffness of right knee, not elsewhere classified  Acute pain of right knee     Problem List Patient Active Problem List   Diagnosis Date Noted  . S/P TKR (total knee replacement), left 08/24/2017  . Status post total knee replacement, right 03/02/2017  . Chronic intractable headache 08/09/2016  . Other specified hypothyroidism 03/02/2016  . A-fib ([place]) 12/24/2015  . Non-ischemic cardiomyopathy ([place]) 11/07/2015  . Long term (current) use of anticoagulants 11/07/2015  . Diaphoresis 11/01/2015  . Chest pain 11/01/2015  . PAF (paroxysmal atrial fibrillation) ([place]) 07/15/2015  . Heart palpitations 07/11/2015  . Dyspnea 02/18/2015  . Anemia, iron deficiency  09/10/2013  . Diaphragmatic hernia without mention of obstruction or gangrene   .  Hypothyroidism   . Malaise and fatigue   . Arteritis, unspecified ([place])   . Iron deficiency anemia, unspecified   . Obesity   . BPH (benign prostatic hyperplasia)   . Essential hypertension, benign   . Hyperlipidemia     RAMSEUR,CHRIS, PTA 09/14/2017, 12:25 PM  [facility] [address] [place], [place], [address] Phone: [phone]   Fax:  [phone]  Name: Delbert Darley MRN: [id number] Date of Birth: 1951/06/22

## 2017-09-14 NOTE — Patient Instructions (Signed)
Medication Instructions:   Your physician recommends that you continue on your current medications as directed. Please refer to the Current Medication list given to you today.  Labwork:  NONE  Testing/Procedures:  NONE  Follow-Up: Your physician recommends that you schedule a follow-up appointment in: 1 year. Please schedule this appointment today before leaving the office.  Any Other Special Instructions Will Be Listed Below (If Applicable).  If you need a refill on your cardiac medications before your next appointment, please call your pharmacy. 

## 2017-09-14 NOTE — Progress Notes (Signed)
PCP: Chipper Herb, MD Primary Cardiologist: Dr Debara Pickett Primary EP: Dr Rayann Heman  Troy Acosta is a 66 y.o. male who presents today for routine electrophysiology followup.  Since last being seen in our clinic, the patient reports doing very well.  Today, he denies symptoms of palpitations, chest pain, shortness of breath,  lower extremity edema, dizziness, presyncope, or syncope.  The patient is otherwise without complaint today.   Past Medical History:  Diagnosis Date  . Anxiety   . Arthritis    "all my joints" (08/24/2017)  . Diaphragmatic hernia without mention of obstruction or gangrene   . Dysrhythmia    afib, ablation- 12/2015, no problems since, off blood thinner- 09/2016  . Essential hypertension, benign   . Headache    had MRI- was consulted with Dr. Jannifer Franklin,, headache has resolved"probably related to caffeine"  . Hyperplasia of prostate   . Iron deficiency anemia, unspecified   . Malaise and fatigue   . Obesity   . Other and unspecified hyperlipidemia   . Paroxysmal atrial fibrillation (HCC)   . PONV (postoperative nausea and vomiting)   . Unspecified hypothyroidism   . Wears dentures    Past Surgical History:  Procedure Laterality Date  . COLONOSCOPY WITH ESOPHAGOGASTRODUODENOSCOPY (EGD)    . ELECTROPHYSIOLOGIC STUDY N/A 12/24/2015   Afib ablation by Dr Rayann Heman  . JOINT REPLACEMENT    . KNEE ARTHROSCOPY Right ~ 2016  . LUNG BIOPSY  1990s   "no problems identified"  . MULTIPLE TOOTH EXTRACTIONS    . TOTAL KNEE ARTHROPLASTY Right 03/02/2017   Procedure: RIGHT TOTAL KNEE ARTHROPLASTY;  Surgeon: [name], MD;  Location: [facility];  Service: Orthopedics;  Laterality: Right;  . TOTAL KNEE ARTHROPLASTY Left 08/24/2017  . TOTAL KNEE ARTHROPLASTY Left 08/24/2017   Procedure: LEFT TOTAL KNEE ARTHROPLASTY;  Surgeon: [name], MD;  Location: [facility];  Service: Orthopedics;  Laterality: Left;    ROS- all systems are reviewed and negatives except as per HPI above  Current  Outpatient Medications  Medication Sig Dispense Refill  . amLODipine (NORVASC) 5 MG tablet Take 1 tablet (5 mg total) by mouth daily. (Patient taking differently: Take 5 mg by mouth daily at 12 noon. ) 180 tablet 3  . aspirin (ASPIRIN CHILDRENS) 81 MG chewable tablet Chew 1 tablet (81 mg total) 2 (two) times daily by mouth. 60 tablet 0  . Cholecalciferol 2000 units CAPS Take 2,000 Units by mouth every evening.    . docusate sodium (COLACE) 100 MG capsule Take 1 capsule (100 mg total) 2 (two) times daily by mouth. 10 capsule 0  . hydrochlorothiazide (HYDRODIURIL) 25 MG tablet Take 1 tablet (25 mg total) by mouth daily. 90 tablet 3  . iron polysaccharides (FERREX 150) 150 MG capsule Take 1 capsule (150 mg total) by mouth daily. (Patient taking differently: Take 150 mg by mouth at bedtime. ) 90 capsule 3  . levothyroxine (SYNTHROID, LEVOTHROID) 150 MCG tablet TAKE 1 TABLET BY MOUTH  DAILY BEFORE BREAKFAST 90 tablet 0  . levothyroxine (SYNTHROID, LEVOTHROID) 25 MCG tablet TAKE ONE-HALF TABLET BY  MOUTH ONCE DAILY BEFORE  BREAKFAST 45 tablet 0  . methocarbamol (ROBAXIN) 500 MG tablet Take 1 tablet (500 mg total) 3 (three) times daily as needed by mouth. 60 tablet 1  . olmesartan (BENICAR) 40 MG tablet Take 0.5 tablets (20 mg total) by mouth 2 (two) times daily. 180 tablet 3  . oxyCODONE-acetaminophen (ROXICET) 5-325 MG tablet Take 1-2 tablets every 4 (four) hours as needed  by mouth for severe pain. 40 tablet 0  . rosuvastatin (CRESTOR) 5 MG tablet TAKE 1 TABLET BY MOUTH  DAILY 90 tablet 0   No current facility-administered medications for this visit.    Facility-Administered Medications Ordered in Other Visits  Medication Dose Route Frequency Provider Last Rate Last Dose  . gadopentetate dimeglumine (MAGNEVIST) injection 20 mL  20 mL Intravenous Once PRN Kathrynn Ducking, MD        Physical Exam: Vitals:   09/14/17 0805  BP: 133/86  Pulse: 72  Weight: 246 lb 9.6 oz (111.9 kg)  Height: 5\' 11"   (1.803 m)    GEN- The patient is well appearing, alert and oriented x 3 today.   Head- normocephalic, atraumatic Eyes-  Sclera clear, conjunctiva pink Ears- hearing intact Oropharynx- clear Lungs- Clear to ausculation bilaterally, normal work of breathing Heart- Regular rate and rhythm, no murmurs, rubs or gallops, PMI not laterally displaced GI- soft, NT, ND, + BS Extremities- no clubbing, cyanosis, or edema  EKG tracing ordered today is personally reviewed and shows sinus rhythm  Assessment and Plan:  1. Paroxysmal atrial fibrillation Doing well s/p ablation off AAD therapy Very pleased with results No anticoagulation (chads2vasc score is 1)  2. HTN Stable No change required today  3. Nonischemic CM Resolved with sinus rhythm  Return to see me in a year unless problems arise  [name] MD, [facility] 09/14/2017 8:51 AM

## 2017-09-17 ENCOUNTER — Encounter: Payer: BLUE CROSS/BLUE SHIELD | Admitting: Physical Therapy

## 2017-09-19 ENCOUNTER — Encounter: Payer: Self-pay | Admitting: Physical Therapy

## 2017-09-19 ENCOUNTER — Ambulatory Visit: Payer: BLUE CROSS/BLUE SHIELD | Admitting: Physical Therapy

## 2017-09-19 DIAGNOSIS — G8929 Other chronic pain: Secondary | ICD-10-CM | POA: Diagnosis not present

## 2017-09-19 DIAGNOSIS — M25662 Stiffness of left knee, not elsewhere classified: Secondary | ICD-10-CM | POA: Diagnosis not present

## 2017-09-19 DIAGNOSIS — R600 Localized edema: Secondary | ICD-10-CM

## 2017-09-19 DIAGNOSIS — M25562 Pain in left knee: Secondary | ICD-10-CM | POA: Diagnosis not present

## 2017-09-19 DIAGNOSIS — M25661 Stiffness of right knee, not elsewhere classified: Secondary | ICD-10-CM | POA: Diagnosis not present

## 2017-09-19 DIAGNOSIS — M25561 Pain in right knee: Secondary | ICD-10-CM | POA: Diagnosis not present

## 2017-09-19 NOTE — Therapy (Signed)
[facility] [place], [place], [address] Phone: [phone]   Fax:  [phone]  Physical Therapy Treatment  Patient Details  Name: Troy Acosta MRN: [id number] Date of Birth: 04-17-1951 Referring Provider: Esmond Plants MD   Encounter Date: 09/19/2017  PT End of Session - 09/19/17 1208    Visit Number  3    Number of Visits  12    Date for PT Re-Evaluation  10/24/17    PT Start Time  1115    PT Stop Time  1206    PT Time Calculation (min)  51 min    Activity Tolerance  Patient tolerated treatment well;Patient limited by pain    Behavior During Therapy  [facility] for tasks assessed/performed       Past Medical History:  Diagnosis Date  . Anxiety   . Arthritis    "all my joints" (08/24/2017)  . Diaphragmatic hernia without mention of obstruction or gangrene   . Dysrhythmia    afib, ablation- 12/2015, no problems since, off blood thinner- 09/2016  . Essential hypertension, benign   . Headache    had MRI- was consulted with Dr. Jannifer Franklin,, headache has resolved"probably related to caffeine"  . Hyperplasia of prostate   . Iron deficiency anemia, unspecified   . Malaise and fatigue   . Obesity   . Other and unspecified hyperlipidemia   . Paroxysmal atrial fibrillation (HCC)   . PONV (postoperative nausea and vomiting)   . Unspecified hypothyroidism   . Wears dentures     Past Surgical History:  Procedure Laterality Date  . COLONOSCOPY WITH ESOPHAGOGASTRODUODENOSCOPY (EGD)    . ELECTROPHYSIOLOGIC STUDY N/A 12/24/2015   Afib ablation by Dr Rayann Heman  . JOINT REPLACEMENT    . KNEE ARTHROSCOPY Right ~ 2016  . LUNG BIOPSY  1990s   "no problems identified"  . MULTIPLE TOOTH EXTRACTIONS    . TOTAL KNEE ARTHROPLASTY Right 03/02/2017   Procedure: RIGHT TOTAL KNEE ARTHROPLASTY;  Surgeon: [name], MD;  Location: [place];  Service: Orthopedics;  Laterality: Right;  . TOTAL KNEE ARTHROPLASTY Left 08/24/2017  . TOTAL KNEE  ARTHROPLASTY Left 08/24/2017   Procedure: LEFT TOTAL KNEE ARTHROPLASTY;  Surgeon: [name], MD;  Location: [place];  Service: Orthopedics;  Laterality: Left;    There were no vitals filed for this visit.  Subjective Assessment - 09/19/17 1120    Subjective  Patient reported some increased stiffness today    Pertinent History  Right total knee replacement.    Limitations  Walking    How long can you sit comfortably?  2 hours    How long can you stand comfortably?  2 hours    How long can you walk comfortably?  Short community distances.  He is not driving yet.    Diagnostic tests  X-ray     Patient Stated Goals  2hrs    Currently in Pain?  Yes    Pain Score  10-Worst pain ever    Pain Location  Knee    Pain Orientation  Left    Pain Descriptors / Indicators  Aching    Pain Type  Surgical pain    Pain Onset  1 to 4 weeks ago    Pain Frequency  Constant    Aggravating Factors   prolong activity    Pain Relieving Factors  rest and ice         [facility] PT Assessment - 09/19/17 0001      ROM / Strength  AROM / PROM / Strength  AROM;PROM      AROM   AROM Assessment Site  Knee    Right/Left Knee  Left    Right Knee Extension  -13    Right Knee Flexion  94      PROM   PROM Assessment Site  Knee    Right/Left Knee  Left    Right Knee Extension  -8    Right Knee Flexion  100                  [facility] Adult PT Treatment/Exercise - 09/19/17 0001      Knee/Hip Exercises: Aerobic   Nustep  Level 5 x 15 minutes.(adjusted [id number])      Electrical Stimulation   Electrical Stimulation Location  Left knee.    Chartered certified accountant  IFC    Electrical Stimulation Parameters  1-10HZ  X15MIN    Electrical Stimulation Goals  Edema;Pain      Vasopneumatic   Number Minutes Vasopneumatic   15 minutes    Vasopnuematic Location   Knee    Vasopneumatic Pressure  Medium      Manual Therapy   Manual Therapy  Passive ROM    Passive ROM  PROM of LEFT knee into FLEXION  / extension with holds at end range               PT Short Term Goals - 09/12/17 1243      PT SHORT TERM GOAL #1   Title  STG's=LTG's.        PT Long Term Goals - 09/19/17 1129      PT LONG TERM GOAL #1   Title  Independent with a HEP.    Time  6    Period  Weeks    Status  On-going      PT LONG TERM GOAL #2   Title  Full active left knee extension in order to normalize gait.    Time  6    Period  Weeks    Status  On-going      PT LONG TERM GOAL #3   Title  Active left knee flexion to 115 degrees+ so the patient can perform functional tasks and do so with pain not > 2-3/10.    Time  6    Period  Weeks    Status  On-going      PT LONG TERM GOAL #4   Title  Increase left knee strength to a solid 5/5 to provide good stability for accomplishment of functional activities.    Time  6    Period  Weeks    Status  On-going      PT LONG TERM GOAL #5   Title  Perform a reciprocating stair gait with one railing.    Time  6    Period  Weeks    Status  On-going            Plan - 09/19/17 1209    Clinical Impression Statement  Patient tolerated treatment fair today. Patient reported increased pain for unknown reason. Today focused on ROM in left knee. Patient improved ROM after manual stretching. Goals ongoing at this time.    Rehab Potential  Excellent    PT Frequency  2x / week    PT Duration  6 weeks    PT Treatment/Interventions  ADLs/Self Care Home Management;Cryotherapy;Electrical Stimulation;Gait training;Stair training;Functional mobility training;Therapeutic activities;Therapeutic exercise;Neuromuscular re-education;Patient/family education;Passive range of motion;Manual techniques;Vasopneumatic Device    PT  Next Visit Plan  Total knee replacement protocol.  PROM.  Progress to PRE's.  Electrical stimulation and vasopneumatic.    Consulted and Agree with Plan of Care  Patient       Patient will benefit from skilled therapeutic intervention in order to  improve the following deficits and impairments:  Abnormal gait, Decreased activity tolerance, Decreased mobility, Decreased range of motion, Increased edema, Decreased strength, Pain  Visit Diagnosis: Chronic pain of left knee  Stiffness of left knee, not elsewhere classified  Localized edema     Problem List Patient Active Problem List   Diagnosis Date Noted  . S/P TKR (total knee replacement), left 08/24/2017  . Status post total knee replacement, right 03/02/2017  . Chronic intractable headache 08/09/2016  . Other specified hypothyroidism 03/02/2016  . A-fib ([place]) 12/24/2015  . Non-ischemic cardiomyopathy ([place]) 11/07/2015  . Long term (current) use of anticoagulants 11/07/2015  . Diaphoresis 11/01/2015  . Chest pain 11/01/2015  . PAF (paroxysmal atrial fibrillation) ([place]) 07/15/2015  . Heart palpitations 07/11/2015  . Dyspnea 02/18/2015  . Anemia, iron deficiency 09/10/2013  . Diaphragmatic hernia without mention of obstruction or gangrene   . Hypothyroidism   . Malaise and fatigue   . Arteritis, unspecified ([place])   . Iron deficiency anemia, unspecified   . Obesity   . BPH (benign prostatic hyperplasia)   . Essential hypertension, benign   . Hyperlipidemia     DUNFORD, CHRISTINA P, PTA 09/19/2017, 12:14 PM  [facility] [address] [place], [place], [address] Phone: [phone]   Fax:  [phone]  Name: Apollo Timothy MRN: [id number] Date of Birth: 18-Feb-1951

## 2017-09-21 ENCOUNTER — Encounter: Payer: Self-pay | Admitting: Physical Therapy

## 2017-09-21 ENCOUNTER — Ambulatory Visit: Payer: BLUE CROSS/BLUE SHIELD | Admitting: Physical Therapy

## 2017-09-21 DIAGNOSIS — R600 Localized edema: Secondary | ICD-10-CM

## 2017-09-21 DIAGNOSIS — M25661 Stiffness of right knee, not elsewhere classified: Secondary | ICD-10-CM | POA: Diagnosis not present

## 2017-09-21 DIAGNOSIS — G8929 Other chronic pain: Secondary | ICD-10-CM | POA: Diagnosis not present

## 2017-09-21 DIAGNOSIS — M25662 Stiffness of left knee, not elsewhere classified: Secondary | ICD-10-CM

## 2017-09-21 DIAGNOSIS — M25562 Pain in left knee: Secondary | ICD-10-CM | POA: Diagnosis not present

## 2017-09-21 DIAGNOSIS — M25561 Pain in right knee: Secondary | ICD-10-CM | POA: Diagnosis not present

## 2017-09-21 NOTE — Therapy (Signed)
[facility] [place], [place], [address] Phone: [phone]   Fax:  [phone]  Physical Therapy Treatment  Patient Details  Name: Troy Acosta MRN: [id number] Date of Birth: December 16, 1950 Referring Provider: Esmond Plants MD   Encounter Date: 09/21/2017  PT End of Session - 09/21/17 1117    Visit Number  4    Number of Visits  12    Date for PT Re-Evaluation  10/24/17    PT Start Time  1118    PT Stop Time  1218    PT Time Calculation (min)  60 min    Activity Tolerance  Patient tolerated treatment well;Patient limited by pain    Behavior During Therapy  [facility] for tasks assessed/performed       Past Medical History:  Diagnosis Date  . Anxiety   . Arthritis    "all my joints" (08/24/2017)  . Diaphragmatic hernia without mention of obstruction or gangrene   . Dysrhythmia    afib, ablation- 12/2015, no problems since, off blood thinner- 09/2016  . Essential hypertension, benign   . Headache    had MRI- was consulted with Dr. Jannifer Franklin,, headache has resolved"probably related to caffeine"  . Hyperplasia of prostate   . Iron deficiency anemia, unspecified   . Malaise and fatigue   . Obesity   . Other and unspecified hyperlipidemia   . Paroxysmal atrial fibrillation (HCC)   . PONV (postoperative nausea and vomiting)   . Unspecified hypothyroidism   . Wears dentures     Past Surgical History:  Procedure Laterality Date  . COLONOSCOPY WITH ESOPHAGOGASTRODUODENOSCOPY (EGD)    . ELECTROPHYSIOLOGIC STUDY N/A 12/24/2015   Afib ablation by Dr Rayann Heman  . JOINT REPLACEMENT    . KNEE ARTHROSCOPY Right ~ 2016  . LUNG BIOPSY  1990s   "no problems identified"  . MULTIPLE TOOTH EXTRACTIONS    . TOTAL KNEE ARTHROPLASTY Right 03/02/2017   Procedure: RIGHT TOTAL KNEE ARTHROPLASTY;  Surgeon: [name], MD;  Location: [place];  Service: Orthopedics;  Laterality: Right;  . TOTAL KNEE ARTHROPLASTY Left 08/24/2017  . TOTAL KNEE  ARTHROPLASTY Left 08/24/2017   Procedure: LEFT TOTAL KNEE ARTHROPLASTY;  Surgeon: [name], MD;  Location: [place];  Service: Orthopedics;  Laterality: Left;    There were no vitals filed for this visit.  Subjective Assessment - 09/21/17 1116    Subjective  Reports that he hasn't been able to do much due to weather but states that he is still using [employer] as the company has not picked it up yet. Reports that he has muscle tightness especially at night.    Pertinent History  Right total knee replacement.    Limitations  Walking    How long can you sit comfortably?  2 hours    How long can you stand comfortably?  2 hours    How long can you walk comfortably?  Short community distances.  He is not driving yet.    Diagnostic tests  X-ray     Patient Stated Goals  2hrs    Currently in Pain?  Yes    Pain Score  -- No pain score provided by patient    Pain Location  Knee    Pain Orientation  Left    Pain Descriptors / Indicators  Discomfort    Pain Type  Surgical pain    Pain Onset  1 to 4 weeks ago         [facility] PT Assessment - 09/21/17  0001      Assessment   Medical Diagnosis  Left total knee replacement.    Onset Date/Surgical Date  08/24/17    Next MD Visit  10/23/2017      Restrictions   Weight Bearing Restrictions  No      ROM / Strength   AROM / PROM / Strength  AROM      AROM   Overall AROM   Deficits    AROM Assessment Site  Knee    Right/Left Knee  Left    Left Knee Extension  -7    Left Knee Flexion  107                  [facility] Adult PT Treatment/Exercise - 09/21/17 0001      Knee/Hip Exercises: Aerobic   Nustep  Level 5 x 15 minutes.(adjusted seat14-12)      Knee/Hip Exercises: Standing   Forward Lunges  Right;3 sets;10 reps;4 seconds    Functional Squat  20 reps    Rocker Board  4 minutes      Modalities   Modalities  Electrical Stimulation;Vasopneumatic      Acupuncturist Location  L knee    Electrical  Stimulation Action  IFC    Electrical Stimulation Parameters  1-10 hz x15 min    Electrical Stimulation Goals  Edema;Pain      Vasopneumatic   Number Minutes Vasopneumatic   15 minutes    Vasopnuematic Location   Knee    Vasopneumatic Pressure  Medium    Vasopneumatic Temperature   34      Manual Therapy   Manual Therapy  Passive ROM    Passive ROM  PROM of L knee into flexion, extension with holds at end range                PT Short Term Goals - 09/12/17 1243      PT SHORT TERM GOAL #1   Title  STG's=LTG's.        PT Long Term Goals - 09/19/17 1129      PT LONG TERM GOAL #1   Title  Independent with a HEP.    Time  6    Period  Weeks    Status  On-going      PT LONG TERM GOAL #2   Title  Full active left knee extension in order to normalize gait.    Time  6    Period  Weeks    Status  On-going      PT LONG TERM GOAL #3   Title  Active left knee flexion to 115 degrees+ so the patient can perform functional tasks and do so with pain not > 2-3/10.    Time  6    Period  Weeks    Status  On-going      PT LONG TERM GOAL #4   Title  Increase left knee strength to a solid 5/5 to provide good stability for accomplishment of functional activities.    Time  6    Period  Weeks    Status  On-going      PT LONG TERM GOAL #5   Title  Perform a reciprocating stair gait with one railing.    Time  6    Period  Weeks    Status  On-going            Plan - 09/21/17 1215    Clinical Impression Statement  Patient tolerated today's treatment fairly well as he continues to have increased pain and lack of knee flexion. Patient able to tolerate standing ROM exercises fairly well although he reported more discomfort with squats. Patient also reported pain with end range PROM of L knee. AROM of L knee measured as 7-107 deg. Normal modalities.    Rehab Potential  Excellent    PT Frequency  2x / week    PT Duration  6 weeks    PT Treatment/Interventions  ADLs/Self Care  Home Management;Cryotherapy;Electrical Stimulation;Gait training;Stair training;Functional mobility training;Therapeutic activities;Therapeutic exercise;Neuromuscular re-education;Patient/family education;Passive range of motion;Manual techniques;Vasopneumatic Device    PT Next Visit Plan  Total knee replacement protocol.  PROM.  Progress to PRE's.  Electrical stimulation and vasopneumatic.    PT Home Exercise Plan  Quad sets, hamstring stretching, (HEP provided by HHPT with standing hip exercises)    Consulted and Agree with Plan of Care  Patient       Patient will benefit from skilled therapeutic intervention in order to improve the following deficits and impairments:  Abnormal gait, Decreased activity tolerance, Decreased mobility, Decreased range of motion, Increased edema, Decreased strength, Pain  Visit Diagnosis: Chronic pain of left knee  Stiffness of left knee, not elsewhere classified  Localized edema     Problem List Patient Active Problem List   Diagnosis Date Noted  . S/P TKR (total knee replacement), left 08/24/2017  . Status post total knee replacement, right 03/02/2017  . Chronic intractable headache 08/09/2016  . Other specified hypothyroidism 03/02/2016  . A-fib ([place]) 12/24/2015  . Non-ischemic cardiomyopathy ([place]) 11/07/2015  . Long term (current) use of anticoagulants 11/07/2015  . Diaphoresis 11/01/2015  . Chest pain 11/01/2015  . PAF (paroxysmal atrial fibrillation) ([place]) 07/15/2015  . Heart palpitations 07/11/2015  . Dyspnea 02/18/2015  . Anemia, iron deficiency 09/10/2013  . Diaphragmatic hernia without mention of obstruction or gangrene   . Hypothyroidism   . Malaise and fatigue   . Arteritis, unspecified ([place])   . Iron deficiency anemia, unspecified   . Obesity   . BPH (benign prostatic hyperplasia)   . Essential hypertension, benign   . Hyperlipidemia     Standley Brooking, PTA 09/21/2017, 12:24 PM  [facility] [address] [place], [place], [address] Phone: [phone]   Fax:  [phone]  Name: Troy Acosta MRN: [id number] Date of Birth: 07-Dec-1950

## 2017-09-24 ENCOUNTER — Encounter: Payer: Self-pay | Admitting: Physical Therapy

## 2017-09-24 ENCOUNTER — Ambulatory Visit: Payer: BLUE CROSS/BLUE SHIELD | Admitting: Physical Therapy

## 2017-09-24 DIAGNOSIS — M25561 Pain in right knee: Secondary | ICD-10-CM | POA: Diagnosis not present

## 2017-09-24 DIAGNOSIS — R600 Localized edema: Secondary | ICD-10-CM | POA: Diagnosis not present

## 2017-09-24 DIAGNOSIS — M25662 Stiffness of left knee, not elsewhere classified: Secondary | ICD-10-CM | POA: Diagnosis not present

## 2017-09-24 DIAGNOSIS — M25562 Pain in left knee: Principal | ICD-10-CM

## 2017-09-24 DIAGNOSIS — G8929 Other chronic pain: Secondary | ICD-10-CM

## 2017-09-24 DIAGNOSIS — M25661 Stiffness of right knee, not elsewhere classified: Secondary | ICD-10-CM | POA: Diagnosis not present

## 2017-09-24 NOTE — Therapy (Signed)
[facility] [place], [place], [address] Phone: [phone]   Fax:  [phone]  Physical Therapy Treatment  Patient Details  Name: Troy Acosta MRN: [id number] Date of Birth: 03-13-1951 Referring Provider: Esmond Plants MD   Encounter Date: 09/24/2017  PT End of Session - 09/24/17 0735    Visit Number  5    Number of Visits  12    Date for PT Re-Evaluation  10/24/17    PT Start Time  0734    PT Stop Time  0823    PT Time Calculation (min)  49 min    Activity Tolerance  Patient tolerated treatment well;Patient limited by pain    Behavior During Therapy  [facility] for tasks assessed/performed       Past Medical History:  Diagnosis Date  . Anxiety   . Arthritis    "all my joints" (08/24/2017)  . Diaphragmatic hernia without mention of obstruction or gangrene   . Dysrhythmia    afib, ablation- 12/2015, no problems since, off blood thinner- 09/2016  . Essential hypertension, benign   . Headache    had MRI- was consulted with Dr. Jannifer Franklin,, headache has resolved"probably related to caffeine"  . Hyperplasia of prostate   . Iron deficiency anemia, unspecified   . Malaise and fatigue   . Obesity   . Other and unspecified hyperlipidemia   . Paroxysmal atrial fibrillation (HCC)   . PONV (postoperative nausea and vomiting)   . Unspecified hypothyroidism   . Wears dentures     Past Surgical History:  Procedure Laterality Date  . COLONOSCOPY WITH ESOPHAGOGASTRODUODENOSCOPY (EGD)    . ELECTROPHYSIOLOGIC STUDY N/A 12/24/2015   Afib ablation by Dr Rayann Heman  . JOINT REPLACEMENT    . KNEE ARTHROSCOPY Right ~ 2016  . LUNG BIOPSY  1990s   "no problems identified"  . MULTIPLE TOOTH EXTRACTIONS    . TOTAL KNEE ARTHROPLASTY Right 03/02/2017   Procedure: RIGHT TOTAL KNEE ARTHROPLASTY;  Surgeon: [name], MD;  Location: [place];  Service: Orthopedics;  Laterality: Right;  . TOTAL KNEE ARTHROPLASTY Left 08/24/2017  . TOTAL KNEE  ARTHROPLASTY Left 08/24/2017   Procedure: LEFT TOTAL KNEE ARTHROPLASTY;  Surgeon: [name], MD;  Location: [place];  Service: Orthopedics;  Laterality: Left;    There were no vitals filed for this visit.  Subjective Assessment - 09/24/17 0735    Subjective  Reports continued pain and continued flexion deficits.    Pertinent History  Right total knee replacement.    Limitations  Walking    How long can you sit comfortably?  2 hours    How long can you stand comfortably?  2 hours    How long can you walk comfortably?  Short community distances.  He is not driving yet.    Diagnostic tests  X-ray     Patient Stated Goals  2hrs    Currently in Pain?  Other (Comment) No pain assessment provided by patient         [facility] PT Assessment - 09/24/17 0001      Assessment   Medical Diagnosis  Left total knee replacement.    Onset Date/Surgical Date  08/24/17    Next MD Visit  10/23/2017      Restrictions   Weight Bearing Restrictions  No      ROM / Strength   AROM / PROM / Strength  AROM      AROM   Overall AROM   Deficits    AROM  Assessment Site  Knee    Right/Left Knee  Left    Left Knee Extension  -8    Left Knee Flexion  108                  [facility] Adult PT Treatment/Exercise - 09/24/17 0001      Knee/Hip Exercises: Aerobic   Nustep  L7, seat 12-10, x15 min      Knee/Hip Exercises: Standing   Hip Flexion  AROM;Right;2 sets;10 reps    Forward Lunges  Right;3 sets;10 reps;4 seconds    Hip Abduction  AROM;Left;20 reps;Knee straight    Functional Squat  20 reps      Modalities   Modalities  Psychologist, educational Location  L knee    Electrical Stimulation Action  IFC    Electrical Stimulation Parameters  1-10 hz x15 min    Electrical Stimulation Goals  Edema;Pain      Vasopneumatic   Number Minutes Vasopneumatic   15 minutes    Vasopnuematic Location   Knee    Vasopneumatic Pressure   Medium    Vasopneumatic Temperature   34      Manual Therapy   Manual Therapy  Passive ROM    Passive ROM  PROM of L knee into flexion, extension with holds at end range                PT Short Term Goals - 09/12/17 1243      PT SHORT TERM GOAL #1   Title  STG's=LTG's.        PT Long Term Goals - 09/19/17 1129      PT LONG TERM GOAL #1   Title  Independent with a HEP.    Time  6    Period  Weeks    Status  On-going      PT LONG TERM GOAL #2   Title  Full active left knee extension in order to normalize gait.    Time  6    Period  Weeks    Status  On-going      PT LONG TERM GOAL #3   Title  Active left knee flexion to 115 degrees+ so the patient can perform functional tasks and do so with pain not > 2-3/10.    Time  6    Period  Weeks    Status  On-going      PT LONG TERM GOAL #4   Title  Increase left knee strength to a solid 5/5 to provide good stability for accomplishment of functional activities.    Time  6    Period  Weeks    Status  On-going      PT LONG TERM GOAL #5   Title  Perform a reciprocating stair gait with one railing.    Time  6    Period  Weeks    Status  On-going            Plan - 09/24/17 0818    Clinical Impression Statement  Patient tolerated today's treatment fairly well as he still has pain with ROM. Patient able to complete exercises as directed but with facial grimacing and pain with knee flexion. Patient still very painful with PROM of L knee into flexion with less pain in extension. AROM L knee measured as 8-108. Normal modalities response noted following removal of the modalities.       Rehab Potential  Excellent  PT Frequency  2x / week    PT Duration  6 weeks    PT Treatment/Interventions  ADLs/Self Care Home Management;Cryotherapy;Electrical Stimulation;Gait training;Stair training;Functional mobility training;Therapeutic activities;Therapeutic exercise;Neuromuscular re-education;Patient/family education;Passive  range of motion;Manual techniques;Vasopneumatic Device    PT Next Visit Plan  Total knee replacement protocol.  PROM.  Progress to PRE's.  Electrical stimulation and vasopneumatic.    PT Home Exercise Plan  Quad sets, hamstring stretching, (HEP provided by HHPT with standing hip exercises)    Consulted and Agree with Plan of Care  Patient       Patient will benefit from skilled therapeutic intervention in order to improve the following deficits and impairments:  Abnormal gait, Decreased activity tolerance, Decreased mobility, Decreased range of motion, Increased edema, Decreased strength, Pain  Visit Diagnosis: Chronic pain of left knee  Stiffness of left knee, not elsewhere classified  Localized edema     Problem List Patient Active Problem List   Diagnosis Date Noted  . S/P TKR (total knee replacement), left 08/24/2017  . Status post total knee replacement, right 03/02/2017  . Chronic intractable headache 08/09/2016  . Other specified hypothyroidism 03/02/2016  . A-fib ([place]) 12/24/2015  . Non-ischemic cardiomyopathy ([place]) 11/07/2015  . Long term (current) use of anticoagulants 11/07/2015  . Diaphoresis 11/01/2015  . Chest pain 11/01/2015  . PAF (paroxysmal atrial fibrillation) ([place]) 07/15/2015  . Heart palpitations 07/11/2015  . Dyspnea 02/18/2015  . Anemia, iron deficiency 09/10/2013  . Diaphragmatic hernia without mention of obstruction or gangrene   . Hypothyroidism   . Malaise and fatigue   . Arteritis, unspecified ([place])   . Iron deficiency anemia, unspecified   . Obesity   . BPH (benign prostatic hyperplasia)   . Essential hypertension, benign   . Hyperlipidemia     Standley Brooking, PTA 09/24/2017, 8:35 AM  [facility] [address] [place], [place], [address] Phone: [phone]   Fax:  [phone]  Name: Troy Acosta MRN: [id number] Date of Birth: 07-10-1951

## 2017-09-26 ENCOUNTER — Encounter: Payer: Self-pay | Admitting: Physical Therapy

## 2017-09-26 ENCOUNTER — Ambulatory Visit: Payer: BLUE CROSS/BLUE SHIELD | Admitting: Physical Therapy

## 2017-09-26 DIAGNOSIS — M25562 Pain in left knee: Secondary | ICD-10-CM | POA: Diagnosis not present

## 2017-09-26 DIAGNOSIS — R600 Localized edema: Secondary | ICD-10-CM | POA: Diagnosis not present

## 2017-09-26 DIAGNOSIS — G8929 Other chronic pain: Secondary | ICD-10-CM | POA: Diagnosis not present

## 2017-09-26 DIAGNOSIS — M25662 Stiffness of left knee, not elsewhere classified: Secondary | ICD-10-CM

## 2017-09-26 DIAGNOSIS — M25561 Pain in right knee: Secondary | ICD-10-CM | POA: Diagnosis not present

## 2017-09-26 DIAGNOSIS — M25661 Stiffness of right knee, not elsewhere classified: Secondary | ICD-10-CM | POA: Diagnosis not present

## 2017-09-26 NOTE — Therapy (Signed)
[facility] [place], [place], [address] Phone: [phone]   Fax:  [phone]  Physical Therapy Treatment  Patient Details  Name: Troy Acosta MRN: [id number] Date of Birth: [birth year] Referring Provider: Esmond Plants MD   Encounter Date: 09/26/2017  PT End of Session - 09/26/17 0732    Visit Number  6    Number of Visits  12    Date for PT Re-Evaluation  10/24/17    PT Start Time  0730    PT Stop Time  0821    PT Time Calculation (min)  51 min    Activity Tolerance  Patient tolerated treatment well    Behavior During Therapy  [facility] for tasks assessed/performed       Past Medical History:  Diagnosis Date  . Anxiety   . Arthritis    "all my joints" (08/24/2017)  . Diaphragmatic hernia without mention of obstruction or gangrene   . Dysrhythmia    afib, ablation- 12/2015, no problems since, off blood thinner- 09/2016  . Essential hypertension, benign   . Headache    had MRI- was consulted with Dr. Jannifer Franklin,, headache has resolved"probably related to caffeine"  . Hyperplasia of prostate   . Iron deficiency anemia, unspecified   . Malaise and fatigue   . Obesity   . Other and unspecified hyperlipidemia   . Paroxysmal atrial fibrillation (HCC)   . PONV (postoperative nausea and vomiting)   . Unspecified hypothyroidism   . Wears dentures     Past Surgical History:  Procedure Laterality Date  . COLONOSCOPY WITH ESOPHAGOGASTRODUODENOSCOPY (EGD)    . ELECTROPHYSIOLOGIC STUDY N/A 12/24/2015   Afib ablation by Dr Rayann Heman  . JOINT REPLACEMENT    . KNEE ARTHROSCOPY Right ~ 2016  . LUNG BIOPSY  1990s   "no problems identified"  . MULTIPLE TOOTH EXTRACTIONS    . TOTAL KNEE ARTHROPLASTY Right 03/02/2017   Procedure: RIGHT TOTAL KNEE ARTHROPLASTY;  Surgeon: [name], MD;  Location: [place];  Service: Orthopedics;  Laterality: Right;  . TOTAL KNEE ARTHROPLASTY Left 08/24/2017  . TOTAL KNEE ARTHROPLASTY Left 08/24/2017    Procedure: LEFT TOTAL KNEE ARTHROPLASTY;  Surgeon: [name], MD;  Location: [place];  Service: Orthopedics;  Laterality: Left;    There were no vitals filed for this visit.  Subjective Assessment - 09/26/17 0731    Subjective  Reports that he got up leaves on his lawnmower yesterday.    Pertinent History  Right total knee replacement.    Limitations  Walking    How long can you sit comfortably?  2 hours    How long can you stand comfortably?  2 hours    How long can you walk comfortably?  Short community distances.  He is not driving yet.    Diagnostic tests  X-ray     Patient Stated Goals  2hrs    Currently in Pain?  Yes    Pain Score  5     Pain Location  Knee    Pain Orientation  Left    Pain Descriptors / Indicators  Discomfort    Pain Type  Surgical pain    Pain Onset  1 to 4 weeks ago    Pain Frequency  Constant         [facility] PT Assessment - 09/26/17 0001      Assessment   Medical Diagnosis  Left total knee replacement.    Onset Date/Surgical Date  08/24/17    Next MD  Visit  10/23/2017      Restrictions   Weight Bearing Restrictions  No      ROM / Strength   AROM / PROM / Strength  AROM      AROM   Overall AROM   Deficits    AROM Assessment Site  Knee    Right/Left Knee  Left    Left Knee Extension  -7    Left Knee Flexion  110                  [facility] Adult PT Treatment/Exercise - 09/26/17 0001      Knee/Hip Exercises: Aerobic   Nustep  L7, seat 12 x15 min      Knee/Hip Exercises: Standing   Forward Lunges  Right;3 sets;10 reps;4 seconds    Functional Squat  20 reps      Knee/Hip Exercises: Supine   Other Supine Knee/Hip Exercises  L knee flexion at wall x30 reps      Modalities   Modalities  Electrical Stimulation;Vasopneumatic      Electrical Stimulation   Electrical Stimulation Location  L knee    Electrical Stimulation Action  IFC    Electrical Stimulation Parameters  1-10 hz x15 min    Electrical Stimulation Goals  Edema;Pain       Vasopneumatic   Number Minutes Vasopneumatic   15 minutes    Vasopnuematic Location   Knee    Vasopneumatic Pressure  Medium    Vasopneumatic Temperature   34      Manual Therapy   Manual Therapy  Passive ROM    Passive ROM  PROM of L knee into flexion, extension with holds at end range                PT Short Term Goals - 09/12/17 1243      PT SHORT TERM GOAL #1   Title  STG's=LTG's.        PT Long Term Goals - 09/19/17 1129      PT LONG TERM GOAL #1   Title  Independent with a HEP.    Time  6    Period  Weeks    Status  On-going      PT LONG TERM GOAL #2   Title  Full active left knee extension in order to normalize gait.    Time  6    Period  Weeks    Status  On-going      PT LONG TERM GOAL #3   Title  Active left knee flexion to 115 degrees+ so the patient can perform functional tasks and do so with pain not > 2-3/10.    Time  6    Period  Weeks    Status  On-going      PT LONG TERM GOAL #4   Title  Increase left knee strength to a solid 5/5 to provide good stability for accomplishment of functional activities.    Time  6    Period  Weeks    Status  On-going      PT LONG TERM GOAL #5   Title  Perform a reciprocating stair gait with one railing.    Time  6    Period  Weeks    Status  On-going            Plan - 09/26/17 0809    Clinical Impression Statement  Patient still reporting pain in L knee today but still able to complete exercises as directed.  Facial grimacing noted with standing squats and with end range L knee flexion with wall slides in supine. Pain and facial grimacing noted with PROM of L knee flexion at end range. AROM L knee measured as 7-110 deg. Normal modalities response noted following removal of the modalities.    Rehab Potential  Excellent    PT Frequency  2x / week    PT Duration  6 weeks    PT Treatment/Interventions  ADLs/Self Care Home Management;Cryotherapy;Electrical Stimulation;Gait training;Stair  training;Functional mobility training;Therapeutic activities;Therapeutic exercise;Neuromuscular re-education;Patient/family education;Passive range of motion;Manual techniques;Vasopneumatic Device    PT Next Visit Plan  Total knee replacement protocol.  PROM.  Progress to PRE's.  Electrical stimulation and vasopneumatic.    PT Home Exercise Plan  Quad sets, hamstring stretching, (HEP provided by HHPT with standing hip exercises)    Consulted and Agree with Plan of Care  Patient       Patient will benefit from skilled therapeutic intervention in order to improve the following deficits and impairments:  Abnormal gait, Decreased activity tolerance, Decreased mobility, Decreased range of motion, Increased edema, Decreased strength, Pain  Visit Diagnosis: Chronic pain of left knee  Stiffness of left knee, not elsewhere classified  Localized edema     Problem List Patient Active Problem List   Diagnosis Date Noted  . S/P TKR (total knee replacement), left 08/24/2017  . Status post total knee replacement, right 03/02/2017  . Chronic intractable headache 08/09/2016  . Other specified hypothyroidism 03/02/2016  . A-fib ([place]) 12/24/2015  . Non-ischemic cardiomyopathy ([place]) 11/07/2015  . Long term (current) use of anticoagulants 11/07/2015  . Diaphoresis 11/01/2015  . Chest pain 11/01/2015  . PAF (paroxysmal atrial fibrillation) ([place]) 07/15/2015  . Heart palpitations 07/11/2015  . Dyspnea 02/18/2015  . Anemia, iron deficiency 09/10/2013  . Diaphragmatic hernia without mention of obstruction or gangrene   . Hypothyroidism   . Malaise and fatigue   . Arteritis, unspecified ([place])   . Iron deficiency anemia, unspecified   . Obesity   . BPH (benign prostatic hyperplasia)   . Essential hypertension, benign   . Hyperlipidemia     Standley Brooking, PTA 09/26/2017, 8:23 AM  [facility] [address] [place], [place], [address] Phone:  [phone]   Fax:  [phone]  Name: Troy Acosta MRN: [id number] Date of Birth: Jun 03, 1951

## 2017-09-28 ENCOUNTER — Ambulatory Visit: Payer: BLUE CROSS/BLUE SHIELD | Admitting: Physical Therapy

## 2017-09-28 ENCOUNTER — Encounter: Payer: Self-pay | Admitting: Physical Therapy

## 2017-09-28 DIAGNOSIS — M25661 Stiffness of right knee, not elsewhere classified: Secondary | ICD-10-CM | POA: Diagnosis not present

## 2017-09-28 DIAGNOSIS — M25662 Stiffness of left knee, not elsewhere classified: Secondary | ICD-10-CM | POA: Diagnosis not present

## 2017-09-28 DIAGNOSIS — R600 Localized edema: Secondary | ICD-10-CM

## 2017-09-28 DIAGNOSIS — G8929 Other chronic pain: Secondary | ICD-10-CM

## 2017-09-28 DIAGNOSIS — M25562 Pain in left knee: Principal | ICD-10-CM

## 2017-09-28 DIAGNOSIS — M25561 Pain in right knee: Secondary | ICD-10-CM | POA: Diagnosis not present

## 2017-09-28 NOTE — Therapy (Signed)
[facility] [place], [place], [address] Phone: [phone]   Fax:  [phone]  Physical Therapy Treatment  Patient Details  Name: Troy Acosta MRN: [id number] Date of Birth: 11-23-1950 Referring Provider: Esmond Plants MD   Encounter Date: 09/28/2017  PT End of Session - 09/28/17 0823    Visit Number  7    Number of Visits  12    Date for PT Re-Evaluation  10/24/17    PT Start Time  0817    PT Stop Time  0909    PT Time Calculation (min)  52 min    Activity Tolerance  Patient tolerated treatment well    Behavior During Therapy  [facility] for tasks assessed/performed       Past Medical History:  Diagnosis Date  . Anxiety   . Arthritis    "all my joints" (08/24/2017)  . Diaphragmatic hernia without mention of obstruction or gangrene   . Dysrhythmia    afib, ablation- 12/2015, no problems since, off blood thinner- 09/2016  . Essential hypertension, benign   . Headache    had MRI- was consulted with Dr. Jannifer Franklin,, headache has resolved"probably related to caffeine"  . Hyperplasia of prostate   . Iron deficiency anemia, unspecified   . Malaise and fatigue   . Obesity   . Other and unspecified hyperlipidemia   . Paroxysmal atrial fibrillation (HCC)   . PONV (postoperative nausea and vomiting)   . Unspecified hypothyroidism   . Wears dentures     Past Surgical History:  Procedure Laterality Date  . COLONOSCOPY WITH ESOPHAGOGASTRODUODENOSCOPY (EGD)    . ELECTROPHYSIOLOGIC STUDY N/A 12/24/2015   Afib ablation by Dr Rayann Heman  . JOINT REPLACEMENT    . KNEE ARTHROSCOPY Right ~ 2016  . LUNG BIOPSY  1990s   "no problems identified"  . MULTIPLE TOOTH EXTRACTIONS    . TOTAL KNEE ARTHROPLASTY Right 03/02/2017   Procedure: RIGHT TOTAL KNEE ARTHROPLASTY;  Surgeon: [name], MD;  Location: [place];  Service: Orthopedics;  Laterality: Right;  . TOTAL KNEE ARTHROPLASTY Left 08/24/2017  . TOTAL KNEE ARTHROPLASTY Left 08/24/2017   Procedure: LEFT TOTAL KNEE ARTHROPLASTY;  Surgeon: [name], MD;  Location: [place];  Service: Orthopedics;  Laterality: Left;    There were no vitals filed for this visit.  Subjective Assessment - 09/28/17 0818    Subjective  Reports he has purposefully not taken pain meds since 8:30 pm last night.    Pertinent History  Right total knee replacement.    Limitations  Walking    How long can you sit comfortably?  2 hours    How long can you stand comfortably?  2 hours    How long can you walk comfortably?  Short community distances.  He is not driving yet.    Diagnostic tests  X-ray     Patient Stated Goals  2hrs    Currently in Pain?  Yes    Pain Score  5     Pain Location  Knee    Pain Orientation  Left    Pain Descriptors / Indicators  Discomfort    Pain Type  Surgical pain    Pain Onset  1 to 4 weeks ago    Pain Frequency  Constant         [facility] PT Assessment - 09/28/17 0001      Assessment   Medical Diagnosis  Left total knee replacement.    Onset Date/Surgical Date  08/24/17  Next MD Visit  10/23/2017      Restrictions   Weight Bearing Restrictions  No      ROM / Strength   AROM / PROM / Strength  AROM      AROM   Overall AROM   Deficits    AROM Assessment Site  Knee    Right/Left Knee  Left    Left Knee Extension  -8    Left Knee Flexion  108      PROM   PROM Assessment Site  --                  [facility] Adult PT Treatment/Exercise - 09/28/17 0001      Knee/Hip Exercises: Aerobic   Nustep  L7, seat 12 x10 min      Knee/Hip Exercises: Standing   Forward Lunges  Right;3 sets;10 reps;4 seconds    Functional Squat  20 reps      Knee/Hip Exercises: Supine   Other Supine Knee/Hip Exercises  L wall slides for knee flexion x30 reps      Modalities   Modalities  Electrical Stimulation;Vasopneumatic      Electrical Stimulation   Electrical Stimulation Location  L knee    Electrical Stimulation Action  IFC    Electrical Stimulation Parameters   1-10 hz x15 min    Electrical Stimulation Goals  Edema;Pain      Vasopneumatic   Number Minutes Vasopneumatic   15 minutes    Vasopnuematic Location   Knee    Vasopneumatic Pressure  Medium    Vasopneumatic Temperature   34      Manual Therapy   Manual Therapy  Passive ROM    Passive ROM  PROM of L knee into flexion, extension with holds at end range                PT Short Term Goals - 09/12/17 1243      PT SHORT TERM GOAL #1   Title  STG's=LTG's.        PT Long Term Goals - 09/19/17 1129      PT LONG TERM GOAL #1   Title  Independent with a HEP.    Time  6    Period  Weeks    Status  On-going      PT LONG TERM GOAL #2   Title  Full active left knee extension in order to normalize gait.    Time  6    Period  Weeks    Status  On-going      PT LONG TERM GOAL #3   Title  Active left knee flexion to 115 degrees+ so the patient can perform functional tasks and do so with pain not > 2-3/10.    Time  6    Period  Weeks    Status  On-going      PT LONG TERM GOAL #4   Title  Increase left knee strength to a solid 5/5 to provide good stability for accomplishment of functional activities.    Time  6    Period  Weeks    Status  On-going      PT LONG TERM GOAL #5   Title  Perform a reciprocating stair gait with one railing.    Time  6    Period  Weeks    Status  On-going            Plan - 09/28/17 0855    Clinical Impression Statement  Patient tolerated today's treatment well although he continues to report pain in L knee. Patient able to complete exercises for ROM improvement as directed with intermittant facial grimacing. Following PROM of L knee ROM measured as 8-108 deg today. Normal modalities response noted following removal of the modalities.    Rehab Potential  Excellent    PT Frequency  2x / week    PT Duration  6 weeks    PT Treatment/Interventions  ADLs/Self Care Home Management;Cryotherapy;Electrical Stimulation;Gait training;Stair  training;Functional mobility training;Therapeutic activities;Therapeutic exercise;Neuromuscular re-education;Patient/family education;Passive range of motion;Manual techniques;Vasopneumatic Device    PT Next Visit Plan  Total knee replacement protocol.  PROM.  Progress to PRE's.  Electrical stimulation and vasopneumatic.    PT Home Exercise Plan  Quad sets, hamstring stretching, (HEP provided by HHPT with standing hip exercises)    Consulted and Agree with Plan of Care  Patient       Patient will benefit from skilled therapeutic intervention in order to improve the following deficits and impairments:  Abnormal gait, Decreased activity tolerance, Decreased mobility, Decreased range of motion, Increased edema, Decreased strength, Pain  Visit Diagnosis: Chronic pain of left knee  Stiffness of left knee, not elsewhere classified  Localized edema     Problem List Patient Active Problem List   Diagnosis Date Noted  . S/P TKR (total knee replacement), left 08/24/2017  . Status post total knee replacement, right 03/02/2017  . Chronic intractable headache 08/09/2016  . Other specified hypothyroidism 03/02/2016  . A-fib ([place]) 12/24/2015  . Non-ischemic cardiomyopathy ([place]) 11/07/2015  . Long term (current) use of anticoagulants 11/07/2015  . Diaphoresis 11/01/2015  . Chest pain 11/01/2015  . PAF (paroxysmal atrial fibrillation) ([place]) 07/15/2015  . Heart palpitations 07/11/2015  . Dyspnea 02/18/2015  . Anemia, iron deficiency 09/10/2013  . Diaphragmatic hernia without mention of obstruction or gangrene   . Hypothyroidism   . Malaise and fatigue   . Arteritis, unspecified ([place])   . Iron deficiency anemia, unspecified   . Obesity   . BPH (benign prostatic hyperplasia)   . Essential hypertension, benign   . Hyperlipidemia     Standley Brooking, PTA 09/28/2017, 9:14 AM  [facility] [address] [place], [place], [address] Phone:  [phone]   Fax:  [phone]  Name: Lehi Phifer MRN: [id number] Date of Birth: Jul 26, 1951

## 2017-10-01 ENCOUNTER — Encounter: Payer: Self-pay | Admitting: Physical Therapy

## 2017-10-01 ENCOUNTER — Ambulatory Visit: Payer: BLUE CROSS/BLUE SHIELD | Admitting: Physical Therapy

## 2017-10-01 DIAGNOSIS — M25561 Pain in right knee: Secondary | ICD-10-CM | POA: Diagnosis not present

## 2017-10-01 DIAGNOSIS — R600 Localized edema: Secondary | ICD-10-CM

## 2017-10-01 DIAGNOSIS — M25562 Pain in left knee: Principal | ICD-10-CM

## 2017-10-01 DIAGNOSIS — M25662 Stiffness of left knee, not elsewhere classified: Secondary | ICD-10-CM | POA: Diagnosis not present

## 2017-10-01 DIAGNOSIS — G8929 Other chronic pain: Secondary | ICD-10-CM

## 2017-10-01 DIAGNOSIS — M25661 Stiffness of right knee, not elsewhere classified: Secondary | ICD-10-CM | POA: Diagnosis not present

## 2017-10-01 NOTE — Therapy (Signed)
[facility] [place], [place], [address] Phone: [phone]   Fax:  [phone]  Physical Therapy Treatment  Patient Details  Name: Troy Acosta MRN: [id number] Date of Birth: 1950-12-02 Referring Provider: Esmond Plants MD   Encounter Date: 10/01/2017  PT End of Session - 10/01/17 0725    Visit Number  8    Number of Visits  12    Date for PT Re-Evaluation  10/24/17    PT Start Time  0717    PT Stop Time  0806    PT Time Calculation (min)  49 min    Activity Tolerance  Patient tolerated treatment well    Behavior During Therapy  [facility] for tasks assessed/performed       Past Medical History:  Diagnosis Date  . Anxiety   . Arthritis    "all my joints" (08/24/2017)  . Diaphragmatic hernia without mention of obstruction or gangrene   . Dysrhythmia    afib, ablation- 12/2015, no problems since, off blood thinner- 09/2016  . Essential hypertension, benign   . Headache    had MRI- was consulted with Dr. Jannifer Franklin,, headache has resolved"probably related to caffeine"  . Hyperplasia of prostate   . Iron deficiency anemia, unspecified   . Malaise and fatigue   . Obesity   . Other and unspecified hyperlipidemia   . Paroxysmal atrial fibrillation (HCC)   . PONV (postoperative nausea and vomiting)   . Unspecified hypothyroidism   . Wears dentures     Past Surgical History:  Procedure Laterality Date  . COLONOSCOPY WITH ESOPHAGOGASTRODUODENOSCOPY (EGD)    . ELECTROPHYSIOLOGIC STUDY N/A 12/24/2015   Afib ablation by Dr Rayann Heman  . JOINT REPLACEMENT    . KNEE ARTHROSCOPY Right ~ 2016  . LUNG BIOPSY  1990s   "no problems identified"  . MULTIPLE TOOTH EXTRACTIONS    . TOTAL KNEE ARTHROPLASTY Right 03/02/2017   Procedure: RIGHT TOTAL KNEE ARTHROPLASTY;  Surgeon: [name], MD;  Location: [place];  Service: Orthopedics;  Laterality: Right;  . TOTAL KNEE ARTHROPLASTY Left 08/24/2017  . TOTAL KNEE ARTHROPLASTY Left 08/24/2017    Procedure: LEFT TOTAL KNEE ARTHROPLASTY;  Surgeon: [name], MD;  Location: [place];  Service: Orthopedics;  Laterality: Left;    There were no vitals filed for this visit.  Subjective Assessment - 10/01/17 0721    Subjective  "It hurts like hell. I haven't taken pain medication since 7 pm last night."    Pertinent History  Right total knee replacement.    Limitations  Walking    How long can you sit comfortably?  2 hours    How long can you stand comfortably?  2 hours    How long can you walk comfortably?  Short community distances.  He is not driving yet.    Diagnostic tests  X-ray     Patient Stated Goals  2hrs    Currently in Pain?  Yes    Pain Score  5     Pain Location  Knee    Pain Orientation  Left    Pain Descriptors / Indicators  Discomfort    Pain Type  Surgical pain    Pain Onset  1 to 4 weeks ago    Pain Frequency  Constant         [facility] PT Assessment - 10/01/17 0001      Assessment   Medical Diagnosis  Left total knee replacement.    Onset Date/Surgical Date  08/24/17  Next MD Visit  10/23/2017      Restrictions   Weight Bearing Restrictions  No      ROM / Strength   AROM / PROM / Strength  AROM      AROM   Overall AROM   Deficits    AROM Assessment Site  Knee    Right/Left Knee  Left    Left Knee Extension  -5    Left Knee Flexion  110                  [facility] Adult PT Treatment/Exercise - 10/01/17 0001      Knee/Hip Exercises: Aerobic   Nustep  L7, seat 12 x10 min      Knee/Hip Exercises: Standing   Forward Lunges  Right;3 sets;10 reps;2 seconds    Functional Squat  Other (comment) x30 reps      Knee/Hip Exercises: Supine   Other Supine Knee/Hip Exercises  L wall slides for knee flexion x30 reps      Modalities   Modalities  Electrical Stimulation;Vasopneumatic      Electrical Stimulation   Electrical Stimulation Location  L knee    Electrical Stimulation Action  IFC     Electrical Stimulation Parameters  1-10 hz x15 min     Electrical Stimulation Goals  Edema;Pain      Vasopneumatic   Number Minutes Vasopneumatic   15 minutes    Vasopnuematic Location   Knee    Vasopneumatic Pressure  Medium    Vasopneumatic Temperature   34      Manual Therapy   Manual Therapy  Passive ROM    Passive ROM  PROM of L knee into flexion, extension with holds at end range                PT Short Term Goals - 09/12/17 1243      PT SHORT TERM GOAL #1   Title  STG's=LTG's.        PT Long Term Goals - 09/19/17 1129      PT LONG TERM GOAL #1   Title  Independent with a HEP.    Time  6    Period  Weeks    Status  On-going      PT LONG TERM GOAL #2   Title  Full active left knee extension in order to normalize gait.    Time  6    Period  Weeks    Status  On-going      PT LONG TERM GOAL #3   Title  Active left knee flexion to 115 degrees+ so the patient can perform functional tasks and do so with pain not > 2-3/10.    Time  6    Period  Weeks    Status  On-going      PT LONG TERM GOAL #4   Title  Increase left knee strength to a solid 5/5 to provide good stability for accomplishment of functional activities.    Time  6    Period  Weeks    Status  On-going      PT LONG TERM GOAL #5   Title  Perform a reciprocating stair gait with one railing.    Time  6    Period  Weeks    Status  On-going            Plan - 10/01/17 1517    Clinical Impression Statement  Patient tolerated today's treatment well although he still reports pain  in L knee. Patient able to complete all exercises as directed without reports of pain during the exercises. Patient able to tolerate PROM with increased overpressure at end range flexion. AROM of L knee measured as 5-110 deg following PROM. Normal modalities response noted following removal of the modalities.    Rehab Potential  Excellent    PT Frequency  2x / week    PT Duration  6 weeks    PT Treatment/Interventions  ADLs/Self Care Home  Management;Cryotherapy;Electrical Stimulation;Gait training;Stair training;Functional mobility training;Therapeutic activities;Therapeutic exercise;Neuromuscular re-education;Patient/family education;Passive range of motion;Manual techniques;Vasopneumatic Device    PT Next Visit Plan  Total knee replacement protocol.  PROM.  Progress to PRE's.  Electrical stimulation and vasopneumatic.    PT Home Exercise Plan  Quad sets, hamstring stretching, (HEP provided by HHPT with standing hip exercises)    Consulted and Agree with Plan of Care  Patient       Patient will benefit from skilled therapeutic intervention in order to improve the following deficits and impairments:  Abnormal gait, Decreased activity tolerance, Decreased mobility, Decreased range of motion, Increased edema, Decreased strength, Pain  Visit Diagnosis: Chronic pain of left knee  Stiffness of left knee, not elsewhere classified  Localized edema     Problem List Patient Active Problem List   Diagnosis Date Noted  . S/P TKR (total knee replacement), left 08/24/2017  . Status post total knee replacement, right 03/02/2017  . Chronic intractable headache 08/09/2016  . Other specified hypothyroidism 03/02/2016  . A-fib ([place]) 12/24/2015  . Non-ischemic cardiomyopathy ([place]) 11/07/2015  . Long term (current) use of anticoagulants 11/07/2015  . Diaphoresis 11/01/2015  . Chest pain 11/01/2015  . PAF (paroxysmal atrial fibrillation) ([place]) 07/15/2015  . Heart palpitations 07/11/2015  . Dyspnea 02/18/2015  . Anemia, iron deficiency 09/10/2013  . Diaphragmatic hernia without mention of obstruction or gangrene   . Hypothyroidism   . Malaise and fatigue   . Arteritis, unspecified ([place])   . Iron deficiency anemia, unspecified   . Obesity   . BPH (benign prostatic hyperplasia)   . Essential hypertension, benign   . Hyperlipidemia     Standley Brooking, PTA 10/01/2017, 8:09 AM  [facility] [address] [place], [place], [address] Phone: [phone]   Fax:  [phone]  Name: Troy Acosta MRN: [id number] Date of Birth: 05-27-1951

## 2017-10-03 ENCOUNTER — Encounter: Payer: Self-pay | Admitting: Physical Therapy

## 2017-10-03 ENCOUNTER — Ambulatory Visit: Payer: BLUE CROSS/BLUE SHIELD | Admitting: Physical Therapy

## 2017-10-03 DIAGNOSIS — M25661 Stiffness of right knee, not elsewhere classified: Secondary | ICD-10-CM | POA: Diagnosis not present

## 2017-10-03 DIAGNOSIS — R600 Localized edema: Secondary | ICD-10-CM | POA: Diagnosis not present

## 2017-10-03 DIAGNOSIS — M25562 Pain in left knee: Principal | ICD-10-CM

## 2017-10-03 DIAGNOSIS — G8929 Other chronic pain: Secondary | ICD-10-CM

## 2017-10-03 DIAGNOSIS — M25561 Pain in right knee: Secondary | ICD-10-CM | POA: Diagnosis not present

## 2017-10-03 DIAGNOSIS — M25662 Stiffness of left knee, not elsewhere classified: Secondary | ICD-10-CM | POA: Diagnosis not present

## 2017-10-03 NOTE — Therapy (Signed)
[facility] [place], [place], [address] Phone: [phone]   Fax:  [phone]  Physical Therapy Treatment  Patient Details  Name: Troy Acosta MRN: [id number] Date of Birth: 1951/07/20 Referring Provider: Esmond Plants MD   Encounter Date: 10/03/2017  PT End of Session - 10/03/17 0750    Visit Number  9    Number of Visits  12    Date for PT Re-Evaluation  10/24/17    PT Start Time  0731    PT Stop Time  0824    PT Time Calculation (min)  53 min    Activity Tolerance  Patient tolerated treatment well    Behavior During Therapy  [facility] for tasks assessed/performed       Past Medical History:  Diagnosis Date  . Anxiety   . Arthritis    "all my joints" (08/24/2017)  . Diaphragmatic hernia without mention of obstruction or gangrene   . Dysrhythmia    afib, ablation- 12/2015, no problems since, off blood thinner- 09/2016  . Essential hypertension, benign   . Headache    had MRI- was consulted with Dr. Jannifer Franklin,, headache has resolved"probably related to caffeine"  . Hyperplasia of prostate   . Iron deficiency anemia, unspecified   . Malaise and fatigue   . Obesity   . Other and unspecified hyperlipidemia   . Paroxysmal atrial fibrillation (HCC)   . PONV (postoperative nausea and vomiting)   . Unspecified hypothyroidism   . Wears dentures     Past Surgical History:  Procedure Laterality Date  . COLONOSCOPY WITH ESOPHAGOGASTRODUODENOSCOPY (EGD)    . ELECTROPHYSIOLOGIC STUDY N/A 12/24/2015   Afib ablation by Dr Rayann Heman  . JOINT REPLACEMENT    . KNEE ARTHROSCOPY Right ~ 2016  . LUNG BIOPSY  1990s   "no problems identified"  . MULTIPLE TOOTH EXTRACTIONS    . TOTAL KNEE ARTHROPLASTY Right 03/02/2017   Procedure: RIGHT TOTAL KNEE ARTHROPLASTY;  Surgeon: [name], MD;  Location: [place];  Service: Orthopedics;  Laterality: Right;  . TOTAL KNEE ARTHROPLASTY Left 08/24/2017  . TOTAL KNEE ARTHROPLASTY Left 08/24/2017    Procedure: LEFT TOTAL KNEE ARTHROPLASTY;  Surgeon: [name], MD;  Location: [place];  Service: Orthopedics;  Laterality: Left;    There were no vitals filed for this visit.  Subjective Assessment - 10/03/17 0750    Subjective  Reports that he was finally able to use his stationary bike at home and is wanting to work up to using for longer duration.    Pertinent History  Right total knee replacement.    Limitations  Walking    How long can you sit comfortably?  2 hours    How long can you stand comfortably?  2 hours    How long can you walk comfortably?  Short community distances.  He is not driving yet.    Diagnostic tests  X-ray     Patient Stated Goals  2hrs    Currently in Pain?  Yes    Pain Score  5     Pain Location  Knee    Pain Orientation  Left    Pain Descriptors / Indicators  Discomfort    Pain Type  Surgical pain    Pain Onset  1 to 4 weeks ago    Pain Frequency  Constant         [facility] PT Assessment - 10/03/17 0001      Assessment   Medical Diagnosis  Left total knee  replacement.    Onset Date/Surgical Date  08/24/17    Hand Dominance  Right    Next MD Visit  10/23/2017      Restrictions   Weight Bearing Restrictions  No      ROM / Strength   AROM / PROM / Strength  AROM      AROM   Overall AROM   Deficits    AROM Assessment Site  Knee    Right/Left Knee  Left    Left Knee Extension  -8    Left Knee Flexion  107                  [facility] Adult PT Treatment/Exercise - 10/03/17 0001      Knee/Hip Exercises: Aerobic   Nustep  L7, seat 12 x10 min      Knee/Hip Exercises: Standing   Forward Lunges  Right;3 sets;10 reps;2 seconds    Hip Abduction  AROM;Left;Knee straight;3 sets;10 reps    Functional Squat  Other (comment) x30 reps      Knee/Hip Exercises: Supine   Other Supine Knee/Hip Exercises  L wall slides for knee flexion x30 reps      Modalities   Modalities  Electrical Stimulation;Vasopneumatic      Electrical Stimulation    Electrical Stimulation Location  L knee    Electrical Stimulation Action  IFC    Electrical Stimulation Parameters  1-10 hz x15 min    Electrical Stimulation Goals  Edema;Pain      Vasopneumatic   Number Minutes Vasopneumatic   15 minutes    Vasopnuematic Location   Knee    Vasopneumatic Pressure  Medium    Vasopneumatic Temperature   34      Manual Therapy   Manual Therapy  Passive ROM    Passive ROM  PROM of L knee into flexion, extension with holds at end range                PT Short Term Goals - 09/12/17 1243      PT SHORT TERM GOAL #1   Title  STG's=LTG's.        PT Long Term Goals - 09/19/17 1129      PT LONG TERM GOAL #1   Title  Independent with a HEP.    Time  6    Period  Weeks    Status  On-going      PT LONG TERM GOAL #2   Title  Full active left knee extension in order to normalize gait.    Time  6    Period  Weeks    Status  On-going      PT LONG TERM GOAL #3   Title  Active left knee flexion to 115 degrees+ so the patient can perform functional tasks and do so with pain not > 2-3/10.    Time  6    Period  Weeks    Status  On-going      PT LONG TERM GOAL #4   Title  Increase left knee strength to a solid 5/5 to provide good stability for accomplishment of functional activities.    Time  6    Period  Weeks    Status  On-going      PT LONG TERM GOAL #5   Title  Perform a reciprocating stair gait with one railing.    Time  6    Period  Weeks    Status  On-going  Plan - 10/03/17 0827    Clinical Impression Statement  Patient presented in clinic with continued reports of pain in L knee. Patient able to complete all exercises as directed without complaint of pain. Patient encouraged to promote increased knee flexion with wall slides by sliding close to wall. Tightness at end range of L knee flexion noted today during PROM. Normal modalities response noted following removal of the modalities.    Rehab Potential  Excellent     PT Frequency  2x / week    PT Duration  6 weeks    PT Treatment/Interventions  ADLs/Self Care Home Management;Cryotherapy;Electrical Stimulation;Gait training;Stair training;Functional mobility training;Therapeutic activities;Therapeutic exercise;Neuromuscular re-education;Patient/family education;Passive range of motion;Manual techniques;Vasopneumatic Device    PT Next Visit Plan  Total knee replacement protocol.  PROM.  Progress to PRE's.  Electrical stimulation and vasopneumatic.    PT Home Exercise Plan  Quad sets, hamstring stretching, (HEP provided by HHPT with standing hip exercises)    Consulted and Agree with Plan of Care  Patient       Patient will benefit from skilled therapeutic intervention in order to improve the following deficits and impairments:  Abnormal gait, Decreased activity tolerance, Decreased mobility, Decreased range of motion, Increased edema, Decreased strength, Pain  Visit Diagnosis: Chronic pain of left knee  Stiffness of left knee, not elsewhere classified  Localized edema     Problem List Patient Active Problem List   Diagnosis Date Noted  . S/P TKR (total knee replacement), left 08/24/2017  . Status post total knee replacement, right 03/02/2017  . Chronic intractable headache 08/09/2016  . Other specified hypothyroidism 03/02/2016  . A-fib ([place]) 12/24/2015  . Non-ischemic cardiomyopathy ([place]) 11/07/2015  . Long term (current) use of anticoagulants 11/07/2015  . Diaphoresis 11/01/2015  . Chest pain 11/01/2015  . PAF (paroxysmal atrial fibrillation) ([place]) 07/15/2015  . Heart palpitations 07/11/2015  . Dyspnea 02/18/2015  . Anemia, iron deficiency 09/10/2013  . Diaphragmatic hernia without mention of obstruction or gangrene   . Hypothyroidism   . Malaise and fatigue   . Arteritis, unspecified ([place])   . Iron deficiency anemia, unspecified   . Obesity   . BPH (benign prostatic hyperplasia)   . Essential hypertension, benign   . Hyperlipidemia      Standley Brooking, PTA 10/03/2017, 8:31 AM  [facility] [address] [place], [place], [address] Phone: [phone]   Fax:  [phone]  Name: Saron Tweed MRN: [id number] Date of Birth: [birth year]

## 2017-10-05 ENCOUNTER — Ambulatory Visit (INDEPENDENT_AMBULATORY_CARE_PROVIDER_SITE_OTHER): Payer: BLUE CROSS/BLUE SHIELD | Admitting: Family Medicine

## 2017-10-05 ENCOUNTER — Ambulatory Visit: Payer: BLUE CROSS/BLUE SHIELD | Admitting: *Deleted

## 2017-10-05 ENCOUNTER — Encounter: Payer: Self-pay | Admitting: Family Medicine

## 2017-10-05 VITALS — BP 115/70 | HR 78 | Temp 97.7°F | Ht 71.0 in | Wt 252.0 lb

## 2017-10-05 DIAGNOSIS — Z0000 Encounter for general adult medical examination without abnormal findings: Secondary | ICD-10-CM | POA: Diagnosis not present

## 2017-10-05 DIAGNOSIS — M25562 Pain in left knee: Secondary | ICD-10-CM | POA: Diagnosis not present

## 2017-10-05 DIAGNOSIS — M25662 Stiffness of left knee, not elsewhere classified: Secondary | ICD-10-CM

## 2017-10-05 DIAGNOSIS — D508 Other iron deficiency anemias: Secondary | ICD-10-CM

## 2017-10-05 DIAGNOSIS — M25561 Pain in right knee: Secondary | ICD-10-CM | POA: Diagnosis not present

## 2017-10-05 DIAGNOSIS — I428 Other cardiomyopathies: Secondary | ICD-10-CM

## 2017-10-05 DIAGNOSIS — I10 Essential (primary) hypertension: Secondary | ICD-10-CM

## 2017-10-05 DIAGNOSIS — R600 Localized edema: Secondary | ICD-10-CM

## 2017-10-05 DIAGNOSIS — E034 Atrophy of thyroid (acquired): Secondary | ICD-10-CM

## 2017-10-05 DIAGNOSIS — M25661 Stiffness of right knee, not elsewhere classified: Secondary | ICD-10-CM | POA: Diagnosis not present

## 2017-10-05 DIAGNOSIS — E7849 Other hyperlipidemia: Secondary | ICD-10-CM

## 2017-10-05 DIAGNOSIS — G8929 Other chronic pain: Secondary | ICD-10-CM

## 2017-10-05 DIAGNOSIS — I480 Paroxysmal atrial fibrillation: Secondary | ICD-10-CM

## 2017-10-05 LAB — MICROSCOPIC EXAMINATION
BACTERIA UA: NONE SEEN
Epithelial Cells (non renal): NONE SEEN /hpf (ref 0–10)
RBC, UA: NONE SEEN /hpf (ref 0–?)
Renal Epithel, UA: NONE SEEN /hpf
WBC UA: NONE SEEN /HPF (ref 0–?)

## 2017-10-05 LAB — URINALYSIS, COMPLETE
Bilirubin, UA: NEGATIVE
Glucose, UA: NEGATIVE
LEUKOCYTES UA: NEGATIVE
NITRITE UA: NEGATIVE
PH UA: 5 (ref 5.0–7.5)
Protein, UA: NEGATIVE
RBC UA: NEGATIVE
Specific Gravity, UA: >1.03 — ABNORMAL HIGH (ref 1.005–1.030)
Urobilinogen, Ur: 0.2 mg/dL (ref 0.2–1.0)

## 2017-10-05 MED ORDER — LEVOTHYROXINE SODIUM 25 MCG PO TABS: ORAL_TABLET | ORAL | 3 refills | Status: DC

## 2017-10-05 MED ORDER — LEVOTHYROXINE SODIUM 150 MCG PO TABS: ORAL_TABLET | ORAL | 3 refills | Status: DC

## 2017-10-05 MED ORDER — POLYSACCHARIDE IRON COMPLEX 150 MG PO CAPS: 150.0000 mg | ORAL_CAPSULE | Freq: Every day | ORAL | 3 refills | Status: DC

## 2017-10-05 MED ORDER — AMLODIPINE BESYLATE 5 MG PO TABS: 5.0000 mg | ORAL_TABLET | Freq: Every day | ORAL | 3 refills | Status: DC

## 2017-10-05 MED ORDER — HYDROCHLOROTHIAZIDE 25 MG PO TABS: 25.0000 mg | ORAL_TABLET | Freq: Every day | ORAL | 3 refills | Status: DC

## 2017-10-05 MED ORDER — ROSUVASTATIN CALCIUM 5 MG PO TABS: 5.0000 mg | ORAL_TABLET | Freq: Every day | ORAL | 3 refills | Status: DC

## 2017-10-05 MED ORDER — OLMESARTAN MEDOXOMIL 40 MG PO TABS: 20.0000 mg | ORAL_TABLET | Freq: Two times a day (BID) | ORAL | 3 refills | Status: DC

## 2017-10-05 NOTE — Therapy (Signed)
[facility] [place], [place], [address] Phone: [phone]   Fax:  [phone]  Physical Therapy Treatment  Patient Details  Name: Troy Acosta MRN: [id number] Date of Birth: 07-06-1951 Referring Provider: Esmond Plants MD   Encounter Date: 10/05/2017  PT End of Session - 10/05/17 0811    Visit Number  10    Number of Visits  12    Date for PT Re-Evaluation  10/24/17    PT Start Time  0815    PT Stop Time  7106    PT Time Calculation (min)  59 min       Past Medical History:  Diagnosis Date  . Anxiety   . Arthritis    "all my joints" (08/24/2017)  . Diaphragmatic hernia without mention of obstruction or gangrene   . Dysrhythmia    afib, ablation- 12/2015, no problems since, off blood thinner- 09/2016  . Essential hypertension, benign   . Headache    had MRI- was consulted with Dr. Jannifer Franklin,, headache has resolved"probably related to caffeine"  . Hyperplasia of prostate   . Iron deficiency anemia, unspecified   . Malaise and fatigue   . Obesity   . Other and unspecified hyperlipidemia   . Paroxysmal atrial fibrillation (HCC)   . PONV (postoperative nausea and vomiting)   . Unspecified hypothyroidism   . Wears dentures     Past Surgical History:  Procedure Laterality Date  . COLONOSCOPY WITH ESOPHAGOGASTRODUODENOSCOPY (EGD)    . ELECTROPHYSIOLOGIC STUDY N/A 12/24/2015   Afib ablation by Dr Rayann Heman  . JOINT REPLACEMENT    . KNEE ARTHROSCOPY Right ~ 2016  . LUNG BIOPSY  1990s   "no problems identified"  . MULTIPLE TOOTH EXTRACTIONS    . TOTAL KNEE ARTHROPLASTY Right 03/02/2017   Procedure: RIGHT TOTAL KNEE ARTHROPLASTY;  Surgeon: [name], MD;  Location: [facility];  Service: Orthopedics;  Laterality: Right;  . TOTAL KNEE ARTHROPLASTY Left 08/24/2017  . TOTAL KNEE ARTHROPLASTY Left 08/24/2017   Procedure: LEFT TOTAL KNEE ARTHROPLASTY;  Surgeon: [name], MD;  Location: [facility];  Service: Orthopedics;   Laterality: Left;    There were no vitals filed for this visit.  Subjective Assessment - 10/05/17 0809    Subjective  Reports that he was finally able to use his stationary bike at home and is wanting to work up to using for longer duration. Did ok after last Rx.    Pertinent History  Right total knee replacement.    Limitations  Walking    How long can you sit comfortably?  2 hours    How long can you stand comfortably?  2 hours    How long can you walk comfortably?  Short community distances.  He is not driving yet.    Diagnostic tests  X-ray     Patient Stated Goals  2hrs    Currently in Pain?  Yes    Pain Score  5     Pain Location  Knee    Pain Orientation  Left    Pain Descriptors / Indicators  Discomfort    Pain Type  Surgical pain    Pain Onset  1 to 4 weeks ago    Pain Frequency  Constant                      [facility] Adult PT Treatment/Exercise - 10/05/17 0001      Knee/Hip Exercises: Aerobic   Nustep  L7, seat 11- 12  x15 min      Knee/Hip Exercises: Standing   Forward Lunges  Right;3 sets;10 reps;2 seconds    Functional Squat  -- x30 reps    Rocker Board  3 minutes      Modalities   Modalities  Electrical Stimulation;Vasopneumatic      Electrical Stimulation   Electrical Stimulation Location  L knee  IFC x 15 mins 1-10hz     Electrical Stimulation Goals  Edema;Pain      Vasopneumatic   Number Minutes Vasopneumatic   15 minutes    Vasopnuematic Location   Knee    Vasopneumatic Pressure  Medium    Vasopneumatic Temperature   34      Manual Therapy   Manual Therapy  Passive ROM    Passive ROM  PROM of L knee into flexion, extension with holds at end range                PT Short Term Goals - 09/12/17 1243      PT SHORT TERM GOAL #1   Title  STG's=LTG's.        PT Long Term Goals - 09/19/17 1129      PT LONG TERM GOAL #1   Title  Independent with a HEP.    Time  6    Period  Weeks    Status  On-going      PT LONG TERM GOAL  #2   Title  Full active left knee extension in order to normalize gait.    Time  6    Period  Weeks    Status  On-going      PT LONG TERM GOAL #3   Title  Active left knee flexion to 115 degrees+ so the patient can perform functional tasks and do so with pain not > 2-3/10.    Time  6    Period  Weeks    Status  On-going      PT LONG TERM GOAL #4   Title  Increase left knee strength to a solid 5/5 to provide good stability for accomplishment of functional activities.    Time  6    Period  Weeks    Status  On-going      PT LONG TERM GOAL #5   Title  Perform a reciprocating stair gait with one railing.    Time  6    Period  Weeks    Status  On-going            Plan - 10/05/17 3976    Clinical Impression Statement  Pt arrived today doing fairly well. He was able to perform all LE exs/act's with minimal increase in pain. Rx focused on ROM for flexion and extension. Pain at end-range with both flexion and extension. Flexion ROM to 100 degrees today    Rehab Potential  Excellent    PT Frequency  2x / week    PT Duration  6 weeks    PT Next Visit Plan  Total knee replacement protocol.  PROM.  Progress to PRE's.  Electrical stimulation and vasopneumatic.    PT Home Exercise Plan  Quad sets, hamstring stretching, (HEP provided by HHPT with standing hip exercises)    Consulted and Agree with Plan of Care  Patient       Patient will benefit from skilled therapeutic intervention in order to improve the following deficits and impairments:  Abnormal gait, Decreased activity tolerance, Decreased mobility, Decreased range of motion, Increased edema, Decreased strength,  Pain  Visit Diagnosis: Chronic pain of left knee  Stiffness of left knee, not elsewhere classified  Localized edema     Problem List Patient Active Problem List   Diagnosis Date Noted  . S/P TKR (total knee replacement), left 08/24/2017  . Status post total knee replacement, right 03/02/2017  . Chronic  intractable headache 08/09/2016  . Other specified hypothyroidism 03/02/2016  . A-fib ([place]) 12/24/2015  . Non-ischemic cardiomyopathy ([place]) 11/07/2015  . Long term (current) use of anticoagulants 11/07/2015  . Diaphoresis 11/01/2015  . Chest pain 11/01/2015  . PAF (paroxysmal atrial fibrillation) ([place]) 07/15/2015  . Heart palpitations 07/11/2015  . Dyspnea 02/18/2015  . Anemia, iron deficiency 09/10/2013  . Diaphragmatic hernia without mention of obstruction or gangrene   . Hypothyroidism   . Malaise and fatigue   . Arteritis, unspecified ([place])   . Iron deficiency anemia, unspecified   . Obesity   . BPH (benign prostatic hyperplasia)   . Essential hypertension, benign   . Hyperlipidemia     Deonne Rooks,CHRIS, PTA 10/05/2017, 10:12 AM  [facility] [place], [place], [address] Phone: [phone]   Fax:  [phone]  Name: Troy Acosta MRN: [id number] Date of Birth: [birth year]

## 2017-10-05 NOTE — Patient Instructions (Addendum)
Continue current medications. Continue good therapeutic lifestyle changes which include good diet and exercise. Fall precautions discussed with patient. If an FOBT was given today- please return it to our front desk. If you are over 64 years old - you may need Prevnar 80 or the adult Pneumonia vaccine.  **Flu shots are available--- please call and schedule a FLU-CLINIC appointment**  After your visit with [place] today you will receive a survey in the mail or online from [employer] regarding your care with [place]. Please take a moment to fill this out. Your feedback is very important to [place] as you can help [place] better understand your patient needs as well as improve your experience and satisfaction. WE CARE ABOUT YOU!!!   Continue with physical therapy and follow-up with orthopedist as planned Continue to get eye exams every couple of years Continue to follow-up with cardiology We will call you with the results of the urinalysis and thyroid panel as soon as those results become available Continue with iron replacement

## 2017-10-05 NOTE — Progress Notes (Signed)
Subjective:    Patient ID: Troy Acosta, male    DOB: [birth year], 66 y.o.   MRN: [id number]  HPI Patient is here today for annual wellness exam and follow up of chronic medical problems which includes hyperlipidemia, hypothyroid, and hypertension. He is taking medication regularly.  Troy Acosta comes in today for his annual physical exam.  He is recently seen the cardiologist and will not see him again for a year for follow-up of his paroxysmal atrial fibrillation status post ablation and he is doing well with this.  He will see the cardiologist again in 1 year.  That note was reviewed.  He also has had a total left knee replacement and this was done about the middle of November.  He is requesting a refill on several of his medicines.  He has had lab work done and this will be reviewed with him during the visit.  This was done at an outside lab.  A lipid panel had an LDL see cholesterol that was good at 57 and triglycerides are good at 94 with a low HDL cholesterol at 36.  The blood sugar was good at 91 and renal function was good with normal electrolytes and potassium and a normal creatinine at 0.98.  All liver function tests were normal.  The patient is taking iron and his iron studies were within normal limits.  The CBC had a normal white blood cell count with a good hemoglobin at 13.9 and an adequate platelet count.  The PSA was stable at a low range at 1.3.  The vitamin D level was good at 43.  The TSH was normal at 0.85.  All this lab work will be scanned into his record.  He also brings in blood pressures for review and all of these are excellent and they will also be scanned into his record.  The patient is doing well with no specific complaints.  Patient is pleasant and relaxed and in good spirits.  He still recovering from his left knee replacement and will not go back to work until around the first week of February.  He plans to retire when he turns 40 which would be next December.  He is in good spirits as  I said and denies having any chest pain pressure palpitations or recurrence of atrial fibrillation.  He denies any shortness of breath.  He denies any trouble with his bowel movements other than some constipation but because of taking medication occasionally for pain in the left knee.  Otherwise his bowel habits are stable and he has not seen any blood but he does take iron in the stools or dark-colored.  He has no nausea vomiting or heartburn.  He is passing his water without problems.  He has had his eyes examined recently has been to the dermatologist and the podiatrist.  He is concerned about his thyroid and would like to have that repeated today even though he had a TSH that was done in September that was normal.  He is requesting refills on several of his medicines.  His blood pressure by the nurse today was excellent at 115/70 in the home readings were also good and these were scanned into the record.    Patient Active Problem List   Diagnosis Date Noted  . S/P TKR (total knee replacement), left 08/24/2017  . Status post total knee replacement, right 03/02/2017  . Chronic intractable headache 08/09/2016  . Other specified hypothyroidism 03/02/2016  . A-fib ([place]) 12/24/2015  . Non-ischemic  cardiomyopathy ([place]) 11/07/2015  . Long term (current) use of anticoagulants 11/07/2015  . Diaphoresis 11/01/2015  . Chest pain 11/01/2015  . PAF (paroxysmal atrial fibrillation) ([place]) 07/15/2015  . Heart palpitations 07/11/2015  . Dyspnea 02/18/2015  . Anemia, iron deficiency 09/10/2013  . Diaphragmatic hernia without mention of obstruction or gangrene   . Hypothyroidism   . Malaise and fatigue   . Arteritis, unspecified ([place])   . Iron deficiency anemia, unspecified   . Obesity   . BPH (benign prostatic hyperplasia)   . Essential hypertension, benign   . Hyperlipidemia    Outpatient Encounter Medications as of 10/05/2017  Medication Sig  . amLODipine (NORVASC) 5 MG tablet Take 1 tablet (5 mg  total) by mouth daily. (Patient taking differently: Take 5 mg by mouth daily at 12 noon. )  . aspirin (ASPIRIN CHILDRENS) 81 MG chewable tablet Chew 1 tablet (81 mg total) 2 (two) times daily by mouth.  . Cholecalciferol 2000 units CAPS Take 2,000 Units by mouth every evening.  . hydrochlorothiazide (HYDRODIURIL) 25 MG tablet Take 1 tablet (25 mg total) by mouth daily.  . iron polysaccharides (FERREX 150) 150 MG capsule Take 1 capsule (150 mg total) by mouth daily. (Patient taking differently: Take 150 mg by mouth at bedtime. )  . levothyroxine (SYNTHROID, LEVOTHROID) 150 MCG tablet TAKE 1 TABLET BY MOUTH  DAILY BEFORE BREAKFAST  . levothyroxine (SYNTHROID, LEVOTHROID) 25 MCG tablet TAKE ONE-HALF TABLET BY  MOUTH ONCE DAILY BEFORE  BREAKFAST  . methocarbamol (ROBAXIN) 500 MG tablet Take 1 tablet (500 mg total) 3 (three) times daily as needed by mouth.  . olmesartan (BENICAR) 40 MG tablet Take 0.5 tablets (20 mg total) by mouth 2 (two) times daily.  Marland Kitchen oxyCODONE-acetaminophen (ROXICET) 5-325 MG tablet Take 1-2 tablets every 4 (four) hours as needed by mouth for severe pain.  . rosuvastatin (CRESTOR) 5 MG tablet TAKE 1 TABLET BY MOUTH  DAILY  . [DISCONTINUED] docusate sodium (COLACE) 100 MG capsule Take 1 capsule (100 mg total) 2 (two) times daily by mouth.   Facility-Administered Encounter Medications as of 10/05/2017  Medication  . gadopentetate dimeglumine (MAGNEVIST) injection 20 mL      Review of Systems  Constitutional: Negative.   HENT: Negative.   Eyes: Negative.   Respiratory: Negative.   Cardiovascular: Negative.   Gastrointestinal: Negative.   Endocrine: Negative.   Genitourinary: Negative.   Musculoskeletal: Arthralgias: recent left knee replacement (6 wks ago)  Skin: Negative.   Allergic/Immunologic: Negative.   Neurological: Negative.   Hematological: Negative.   Psychiatric/Behavioral: Negative.        Objective:   Physical Exam  Constitutional: He is oriented to  person, place, and time. He appears well-developed and well-nourished. No distress.  The patient is pleasant and relaxed and doing well and recuperating well from his left knee replacement.  He has a good handle on all of his health maintenance issues and keeps up-to-date with them.  HENT:  Head: Normocephalic and atraumatic.  Right Ear: External ear normal.  Left Ear: External ear normal.  Mouth/Throat: Oropharynx is clear and moist. No oropharyngeal exudate.  Slight nasal congestion bilaterally  Eyes: Conjunctivae and EOM are normal. Pupils are equal, round, and reactive to light. Right eye exhibits no discharge. Left eye exhibits no discharge. No scleral icterus.  Neck: Normal range of motion. Neck supple. No thyromegaly present.  No thyroid enlargement or bruits or anterior cervical adenopathy  Cardiovascular: Normal rate, regular rhythm, normal heart sounds and intact distal pulses.  No murmur heard. The heart is regular at 72/min  Pulmonary/Chest: Effort normal and breath sounds normal. No respiratory distress. He has no wheezes. He has no rales. He exhibits no tenderness.  No axillary adenopathy and no chest wall masses and lungs were clear anteriorly and posteriorly  Abdominal: Soft. Bowel sounds are normal. He exhibits no mass. There is no tenderness. There is no rebound and no guarding.  Mild abdominal obesity without masses bruits or inguinal adenopathy  Genitourinary: Rectum normal, prostate normal and penis normal.  Genitourinary Comments: Prostate was minimally enlarged but soft and smooth with no lumps or masses.  No rectal masses.  No inguinal hernias palpable.  The external genitalia were within normal limits.  Musculoskeletal: He exhibits edema. He exhibits no tenderness.  Minimal swelling that is postop related in both knees but with good mobility and healing normally  Lymphadenopathy:    He has no cervical adenopathy.  Neurological: He is alert and oriented to person,  place, and time. He has normal reflexes. No cranial nerve deficit.  Skin: Skin is warm and dry. No rash noted.  Psychiatric: He has a normal mood and affect. His behavior is normal. Judgment and thought content normal.  Nursing note and vitals reviewed.   BP 115/70 (BP Location: Right Arm)   Pulse 78   Temp 97.7 F (36.5 C) (Oral)   Ht 5\' 11"  (1.803 m)   Wt 252 lb (114.3 kg)   BMI 35.15 kg/m         Assessment & Plan:  1. Essential hypertension -Blood pressure is good today and the patient will continue with his current treatment regimen in addition to watching his sodium intake. - levothyroxine (SYNTHROID, LEVOTHROID) 150 MCG tablet; TAKE 1 TABLET BY MOUTH  DAILY BEFORE BREAKFAST  Dispense: 90 tablet; Refill: 3 - iron polysaccharides (FERREX 150) 150 MG capsule; Take 1 capsule (150 mg total) by mouth daily.  Dispense: 90 capsule; Refill: 3  2. Annual physical exam -Recent labs were reviewed and all of these were good including his blood sugar hemoglobin PSA liver tests kidney function test. - Thyroid Panel With TSH - Urinalysis, Complete  3. Other hyperlipidemia -All cholesterol numbers are good and patient will continue with current treatment  4. Hypothyroidism due to acquired atrophy of thyroid -Repeat thyroid profile today as we have had problems getting this regulated and will make sure that everything is stable. - Thyroid Panel With TSH  5. Paroxysmal atrial fibrillation (HCC) -Continue follow-up with cardiology  6. Essential hypertension, benign -His blood pressure is good today and he will continue with current treatment - amLODipine (NORVASC) 5 MG tablet; Take 1 tablet (5 mg total) by mouth daily.  Dispense: 90 tablet; Refill: 3  7. Non-ischemic cardiomyopathy ([place]) -Follow-up yearly with cardiology  8. Iron deficiency anemia secondary to inadequate dietary iron intake -Patient has been iron deficient for years and with iron replacement he has done well with  maintaining his hemoglobin within the normal range.  He had a colonoscopy about 2 years ago and will be due another one 10 years out from his last one.  Meds ordered this encounter  Medications  . levothyroxine (SYNTHROID, LEVOTHROID) 150 MCG tablet    Sig: TAKE 1 TABLET BY MOUTH  DAILY BEFORE BREAKFAST    Dispense:  90 tablet    Refill:  3  . levothyroxine (SYNTHROID, LEVOTHROID) 25 MCG tablet    Sig: Take 1/2 tab three times weekly    Dispense:  45 tablet  Refill:  3  . iron polysaccharides (FERREX 150) 150 MG capsule    Sig: Take 1 capsule (150 mg total) by mouth daily.    Dispense:  90 capsule    Refill:  3  . amLODipine (NORVASC) 5 MG tablet    Sig: Take 1 tablet (5 mg total) by mouth daily.    Dispense:  90 tablet    Refill:  3  . rosuvastatin (CRESTOR) 5 MG tablet    Sig: Take 1 tablet (5 mg total) by mouth daily.    Dispense:  90 tablet    Refill:  3  . olmesartan (BENICAR) 40 MG tablet    Sig: Take 0.5 tablets (20 mg total) by mouth 2 (two) times daily.    Dispense:  180 tablet    Refill:  3  . hydrochlorothiazide (HYDRODIURIL) 25 MG tablet    Sig: Take 1 tablet (25 mg total) by mouth daily.    Dispense:  90 tablet    Refill:  3   Patient Instructions  Continue current medications. Continue good therapeutic lifestyle changes which include good diet and exercise. Fall precautions discussed with patient. If an FOBT was given today- please return it to our front desk. If you are over 48 years old - you may need Prevnar 83 or the adult Pneumonia vaccine.  **Flu shots are available--- please call and schedule a FLU-CLINIC appointment**  After your visit with [place] today you will receive a survey in the mail or online from [employer] regarding your care with [place]. Please take a moment to fill this out. Your feedback is very important to [place] as you can help [place] better understand your patient needs as well as improve your experience and satisfaction. WE CARE ABOUT YOU!!!    Continue with physical therapy and follow-up with orthopedist as planned Continue to get eye exams every couple of years Continue to follow-up with cardiology We will call you with the results of the urinalysis and thyroid panel as soon as those results become available Continue with iron replacement  Arrie Senate MD

## 2017-10-06 LAB — THYROID PANEL WITH TSH
Free Thyroxine Index: 2 (ref 1.2–4.9)
T3 Uptake Ratio: 26 % (ref 24–39)
T4, Total: 7.8 ug/dL (ref 4.5–12.0)
TSH: 9.24 u[IU]/mL — ABNORMAL HIGH (ref 0.450–4.500)

## 2017-10-08 ENCOUNTER — Encounter: Payer: Self-pay | Admitting: Physical Therapy

## 2017-10-08 ENCOUNTER — Ambulatory Visit: Payer: BLUE CROSS/BLUE SHIELD | Admitting: Physical Therapy

## 2017-10-08 ENCOUNTER — Ambulatory Visit: Payer: BLUE CROSS/BLUE SHIELD | Admitting: Family Medicine

## 2017-10-08 DIAGNOSIS — M25561 Pain in right knee: Secondary | ICD-10-CM | POA: Diagnosis not present

## 2017-10-08 DIAGNOSIS — G8929 Other chronic pain: Secondary | ICD-10-CM

## 2017-10-08 DIAGNOSIS — M25662 Stiffness of left knee, not elsewhere classified: Secondary | ICD-10-CM

## 2017-10-08 DIAGNOSIS — R600 Localized edema: Secondary | ICD-10-CM

## 2017-10-08 DIAGNOSIS — M25562 Pain in left knee: Principal | ICD-10-CM

## 2017-10-08 DIAGNOSIS — M25661 Stiffness of right knee, not elsewhere classified: Secondary | ICD-10-CM | POA: Diagnosis not present

## 2017-10-08 NOTE — Therapy (Signed)
[facility] [place], [place], [address] Phone: [phone]   Fax:  [phone]  Physical Therapy Treatment  Patient Details  Name: Troy Acosta MRN: [id number] Date of Birth: 07/19/1951 Referring Provider: Esmond Plants MD   Encounter Date: 10/08/2017  PT End of Session - 10/08/17 0809    Visit Number  11    Number of Visits  12    Date for PT Re-Evaluation  10/24/17    PT Start Time  0729    PT Stop Time  0825    PT Time Calculation (min)  56 min    Activity Tolerance  Patient tolerated treatment well    Behavior During Therapy  [facility] for tasks assessed/performed       Past Medical History:  Diagnosis Date  . Anxiety   . Arthritis    "all my joints" (08/24/2017)  . Diaphragmatic hernia without mention of obstruction or gangrene   . Dysrhythmia    afib, ablation- 12/2015, no problems since, off blood thinner- 09/2016  . Essential hypertension, benign   . Headache    had MRI- was consulted with Dr. Jannifer Franklin,, headache has resolved"probably related to caffeine"  . Hyperplasia of prostate   . Iron deficiency anemia, unspecified   . Malaise and fatigue   . Obesity   . Other and unspecified hyperlipidemia   . Paroxysmal atrial fibrillation (HCC)   . PONV (postoperative nausea and vomiting)   . Unspecified hypothyroidism   . Wears dentures     Past Surgical History:  Procedure Laterality Date  . COLONOSCOPY WITH ESOPHAGOGASTRODUODENOSCOPY (EGD)    . ELECTROPHYSIOLOGIC STUDY N/A 12/24/2015   Afib ablation by Dr Rayann Heman  . JOINT REPLACEMENT    . KNEE ARTHROSCOPY Right ~ 2016  . LUNG BIOPSY  1990s   "no problems identified"  . MULTIPLE TOOTH EXTRACTIONS    . TOTAL KNEE ARTHROPLASTY Right 03/02/2017   Procedure: RIGHT TOTAL KNEE ARTHROPLASTY;  Surgeon: [name], MD;  Location: [place];  Service: Orthopedics;  Laterality: Right;  . TOTAL KNEE ARTHROPLASTY Left 08/24/2017  . TOTAL KNEE ARTHROPLASTY Left 08/24/2017    Procedure: LEFT TOTAL KNEE ARTHROPLASTY;  Surgeon: [name], MD;  Location: [place];  Service: Orthopedics;  Laterality: Left;    There were no vitals filed for this visit.  Subjective Assessment - 10/08/17 0733    Subjective  Patient arrived with repored progress and some ongoing discomfort    Pertinent History  Right total knee replacement.    Limitations  Walking    How long can you sit comfortably?  2 hours    How long can you stand comfortably?  2 hours    How long can you walk comfortably?  Short community distances.  He is not driving yet.    Diagnostic tests  X-ray     Patient Stated Goals  2hrs    Currently in Pain?  Yes    Pain Score  5     Pain Location  Knee    Pain Orientation  Left    Pain Descriptors / Indicators  Discomfort    Pain Type  Surgical pain    Pain Onset  1 to 4 weeks ago    Aggravating Factors   prolong activity    Pain Relieving Factors  at rest         [facility] PT Assessment - 10/08/17 0001      AROM   AROM Assessment Site  Knee    Right/Left Knee  Left    Left Knee Extension  -5    Left Knee Flexion  90      PROM   PROM Assessment Site  Knee    Right/Left Knee  Left    Left Knee Extension  -8    Left Knee Flexion  105                  [facility] Adult PT Treatment/Exercise - 10/08/17 0001      Knee/Hip Exercises: Aerobic   Nustep  L7, seat 11- 12 x15 min      Knee/Hip Exercises: Standing   Lateral Step Up  Left;3 sets;10 reps;Step Height: 6"    Forward Step Up  3 sets;10 reps;Step Height: 6";Left    Rocker Board  3 minutes      Electrical Stimulation   Electrical Stimulation Location  L knee  IFC x 15 mins 1-10hz     Electrical Stimulation Goals  Edema;Pain      Vasopneumatic   Number Minutes Vasopneumatic   15 minutes    Vasopnuematic Location   Knee    Vasopneumatic Pressure  Medium      Manual Therapy   Manual Therapy  Passive ROM    Passive ROM  PROM of L knee into flexion, extension with holds at end range                 PT Short Term Goals - 09/12/17 1243      PT SHORT TERM GOAL #1   Title  STG's=LTG's.        PT Long Term Goals - 09/19/17 1129      PT LONG TERM GOAL #1   Title  Independent with a HEP.    Time  6    Period  Weeks    Status  On-going      PT LONG TERM GOAL #2   Title  Full active left knee extension in order to normalize gait.    Time  6    Period  Weeks    Status  On-going      PT LONG TERM GOAL #3   Title  Active left knee flexion to 115 degrees+ so the patient can perform functional tasks and do so with pain not > 2-3/10.    Time  6    Period  Weeks    Status  On-going      PT LONG TERM GOAL #4   Title  Increase left knee strength to a solid 5/5 to provide good stability for accomplishment of functional activities.    Time  6    Period  Weeks    Status  On-going      PT LONG TERM GOAL #5   Title  Perform a reciprocating stair gait with one railing.    Time  6    Period  Weeks    Status  On-going            Plan - 10/08/17 1245    Clinical Impression Statement  Patient tolerated treatment well today. Patient continues to progress with exercises and ROM. Patient had increased tightness today and complaints of ache due to weather. Patient reported doing self stretches as instructed. Goals progressing yet ongoing.     Rehab Potential  Excellent    PT Frequency  2x / week    PT Duration  6 weeks    PT Treatment/Interventions  ADLs/Self Care Home Management;Cryotherapy;Electrical Stimulation;Gait training;Stair training;Functional mobility training;Therapeutic activities;Therapeutic exercise;Neuromuscular re-education;Patient/family education;Passive  range of motion;Manual techniques;Vasopneumatic Device    PT Next Visit Plan  Total knee replacement protocol.  PROM.  Progress to PRE's.  Electrical stimulation and vasopneumatic.    Consulted and Agree with Plan of Care  Patient       Patient will benefit from skilled therapeutic intervention  in order to improve the following deficits and impairments:  Abnormal gait, Decreased activity tolerance, Decreased mobility, Decreased range of motion, Increased edema, Decreased strength, Pain  Visit Diagnosis: Chronic pain of left knee  Stiffness of left knee, not elsewhere classified  Localized edema     Problem List Patient Active Problem List   Diagnosis Date Noted  . S/P TKR (total knee replacement), left 08/24/2017  . Status post total knee replacement, right 03/02/2017  . Chronic intractable headache 08/09/2016  . Other specified hypothyroidism 03/02/2016  . A-fib ([place]) 12/24/2015  . Non-ischemic cardiomyopathy ([place]) 11/07/2015  . Long term (current) use of anticoagulants 11/07/2015  . Diaphoresis 11/01/2015  . Chest pain 11/01/2015  . PAF (paroxysmal atrial fibrillation) ([place]) 07/15/2015  . Heart palpitations 07/11/2015  . Dyspnea 02/18/2015  . Anemia, iron deficiency 09/10/2013  . Diaphragmatic hernia without mention of obstruction or gangrene   . Hypothyroidism   . Malaise and fatigue   . Arteritis, unspecified ([place])   . Iron deficiency anemia, unspecified   . Obesity   . BPH (benign prostatic hyperplasia)   . Essential hypertension, benign   . Hyperlipidemia     Ladean Raya, PTA 10/08/17 8:27 AM  [facility] [address] [place], [place], [address] Phone: [phone]   Fax:  [phone]  Name: Sultan Pargas MRN: [id number] Date of Birth: [birth year]

## 2017-10-11 ENCOUNTER — Encounter: Payer: Self-pay | Admitting: Physical Therapy

## 2017-10-11 ENCOUNTER — Ambulatory Visit: Payer: BLUE CROSS/BLUE SHIELD | Attending: Orthopedic Surgery | Admitting: Physical Therapy

## 2017-10-11 DIAGNOSIS — M25562 Pain in left knee: Secondary | ICD-10-CM | POA: Insufficient documentation

## 2017-10-11 DIAGNOSIS — G8929 Other chronic pain: Secondary | ICD-10-CM | POA: Diagnosis not present

## 2017-10-11 DIAGNOSIS — M25662 Stiffness of left knee, not elsewhere classified: Secondary | ICD-10-CM | POA: Diagnosis not present

## 2017-10-11 DIAGNOSIS — R600 Localized edema: Secondary | ICD-10-CM | POA: Diagnosis not present

## 2017-10-11 NOTE — Therapy (Signed)
[facility] [place], [place], [address] Phone: [phone]   Fax:  [phone]  Physical Therapy Treatment  Patient Details  Name: Troy Acosta MRN: [id number] Date of Birth: 08-18-1951 Referring Provider: Esmond Plants MD   Encounter Date: 10/11/2017  PT End of Session - 10/11/17 0748    Visit Number  12    Number of Visits  18    Date for PT Re-Evaluation  11/14/17    PT Start Time  0731    PT Stop Time  0826    PT Time Calculation (min)  55 min    Activity Tolerance  Patient tolerated treatment well    Behavior During Therapy  [facility] for tasks assessed/performed       Past Medical History:  Diagnosis Date  . Anxiety   . Arthritis    "all my joints" (08/24/2017)  . Diaphragmatic hernia without mention of obstruction or gangrene   . Dysrhythmia    afib, ablation- 12/2015, no problems since, off blood thinner- 09/2016  . Essential hypertension, benign   . Headache    had MRI- was consulted with Dr. Jannifer Franklin,, headache has resolved"probably related to caffeine"  . Hyperplasia of prostate   . Iron deficiency anemia, unspecified   . Malaise and fatigue   . Obesity   . Other and unspecified hyperlipidemia   . Paroxysmal atrial fibrillation (HCC)   . PONV (postoperative nausea and vomiting)   . Unspecified hypothyroidism   . Wears dentures     Past Surgical History:  Procedure Laterality Date  . COLONOSCOPY WITH ESOPHAGOGASTRODUODENOSCOPY (EGD)    . ELECTROPHYSIOLOGIC STUDY N/A 12/24/2015   Afib ablation by Dr Rayann Heman  . JOINT REPLACEMENT    . KNEE ARTHROSCOPY Right ~ 2016  . LUNG BIOPSY  1990s   "no problems identified"  . MULTIPLE TOOTH EXTRACTIONS    . TOTAL KNEE ARTHROPLASTY Right 03/02/2017   Procedure: RIGHT TOTAL KNEE ARTHROPLASTY;  Surgeon: [name], MD;  Location: [place];  Service: Orthopedics;  Laterality: Right;  . TOTAL KNEE ARTHROPLASTY Left 08/24/2017  . TOTAL KNEE ARTHROPLASTY Left 08/24/2017   Procedure: LEFT TOTAL KNEE ARTHROPLASTY;  Surgeon: [name], MD;  Location: [place];  Service: Orthopedics;  Laterality: Left;    There were no vitals filed for this visit.  Subjective Assessment - 10/11/17 0748    Subjective  Reports he needs to go back to work next month and has worked himself down to 2 pain pills at night.    Pertinent History  Right total knee replacement.    Limitations  Walking    How long can you sit comfortably?  2 hours    How long can you stand comfortably?  2 hours    How long can you walk comfortably?  Short community distances.  He is not driving yet.    Diagnostic tests  X-ray     Patient Stated Goals  2hrs    Currently in Pain?  Other (Comment) No pain assessment provided by patient         [facility] PT Assessment - 10/11/17 0001      Assessment   Medical Diagnosis  Left total knee replacement.    Onset Date/Surgical Date  08/24/17    Hand Dominance  Right    Next MD Visit  10/23/2017      Restrictions   Weight Bearing Restrictions  No      ROM / Strength   AROM / PROM / Strength  AROM  AROM   Overall AROM   Deficits    AROM Assessment Site  Knee    Right/Left Knee  Left    Left Knee Extension  -8    Left Knee Flexion  111                  [facility] Adult PT Treatment/Exercise - 10/11/17 0001      Knee/Hip Exercises: Aerobic   Nustep  L8 x15 min      Knee/Hip Exercises: Machines for Strengthening   Cybex Knee Extension  10# 3x10 reps    Cybex Leg Press  2 pl, seat 6 x30 reps      Knee/Hip Exercises: Standing   Forward Lunges  Right;3 sets;10 reps;2 seconds    Lateral Step Up  Left;2 sets;10 reps;Hand Hold: 2;Step Height: 6"    Forward Step Up  Left;2 sets;10 reps;Hand Hold: 2;Step Height: 6"      Modalities   Modalities  Psychologist, educational Location  L knee    Electrical Stimulation Action  IFC    Electrical Stimulation Parameters  1-10 hz x15 min     Electrical Stimulation Goals  Edema;Pain      Vasopneumatic   Number Minutes Vasopneumatic   15 minutes    Vasopnuematic Location   Knee    Vasopneumatic Pressure  Medium    Vasopneumatic Temperature   51      Manual Therapy   Manual Therapy  Passive ROM    Passive ROM  PROM of L knee into flexion, extension with holds at end range                PT Short Term Goals - 09/12/17 1243      PT SHORT TERM GOAL #1   Title  STG's=LTG's.        PT Long Term Goals - 09/19/17 1129      PT LONG TERM GOAL #1   Title  Independent with a HEP.    Time  6    Period  Weeks    Status  On-going      PT LONG TERM GOAL #2   Title  Full active left knee extension in order to normalize gait.    Time  6    Period  Weeks    Status  On-going      PT LONG TERM GOAL #3   Title  Active left knee flexion to 115 degrees+ so the patient can perform functional tasks and do so with pain not > 2-3/10.    Time  6    Period  Weeks    Status  On-going      PT LONG TERM GOAL #4   Title  Increase left knee strength to a solid 5/5 to provide good stability for accomplishment of functional activities.    Time  6    Period  Weeks    Status  On-going      PT LONG TERM GOAL #5   Title  Perform a reciprocating stair gait with one railing.    Time  6    Period  Weeks    Status  On-going            Plan - 10/11/17 0818    Clinical Impression Statement  Patient progressed to resistance/machine strengthening and for reinforcement of ROM. Patient had no complaints of pain during treatment although he reported difficulty with knee flexion positioning  on leg press. AROM of R knee measured as 8-111 deg. Normal modalities response noted following removal of the modalities.    Rehab Potential  Excellent    PT Frequency  2x / week    PT Duration  6 weeks    PT Treatment/Interventions  ADLs/Self Care Home Management;Cryotherapy;Electrical Stimulation;Gait training;Stair training;Functional  mobility training;Therapeutic activities;Therapeutic exercise;Neuromuscular re-education;Patient/family education;Passive range of motion;Manual techniques;Vasopneumatic Device    PT Next Visit Plan  Total knee replacement protocol.  PROM.  Progress to PRE's.  Electrical stimulation and vasopneumatic.    PT Home Exercise Plan  Quad sets, hamstring stretching, (HEP provided by HHPT with standing hip exercises)    Consulted and Agree with Plan of Care  Patient       Patient will benefit from skilled therapeutic intervention in order to improve the following deficits and impairments:  Abnormal gait, Decreased activity tolerance, Decreased mobility, Decreased range of motion, Increased edema, Decreased strength, Pain  Visit Diagnosis: Chronic pain of left knee  Stiffness of left knee, not elsewhere classified  Localized edema     Problem List Patient Active Problem List   Diagnosis Date Noted  . S/P TKR (total knee replacement), left 08/24/2017  . Status post total knee replacement, right 03/02/2017  . Chronic intractable headache 08/09/2016  . Other specified hypothyroidism 03/02/2016  . A-fib ([place]) 12/24/2015  . Non-ischemic cardiomyopathy ([place]) 11/07/2015  . Long term (current) use of anticoagulants 11/07/2015  . Diaphoresis 11/01/2015  . Chest pain 11/01/2015  . PAF (paroxysmal atrial fibrillation) ([place]) 07/15/2015  . Heart palpitations 07/11/2015  . Dyspnea 02/18/2015  . Anemia, iron deficiency 09/10/2013  . Diaphragmatic hernia without mention of obstruction or gangrene   . Hypothyroidism   . Malaise and fatigue   . Arteritis, unspecified ([place])   . Iron deficiency anemia, unspecified   . Obesity   . BPH (benign prostatic hyperplasia)   . Essential hypertension, benign   . Hyperlipidemia     Standley Brooking, PTA 10/11/2017, 8:36 AM  [facility] [address] [place], [place], [address] Phone: [phone]   Fax:   [phone]  Name: Troy Acosta MRN: [id number] Date of Birth: Oct 27, 1950

## 2017-10-15 ENCOUNTER — Encounter: Payer: Self-pay | Admitting: Physical Therapy

## 2017-10-15 ENCOUNTER — Ambulatory Visit: Payer: BLUE CROSS/BLUE SHIELD | Admitting: Physical Therapy

## 2017-10-15 DIAGNOSIS — M25662 Stiffness of left knee, not elsewhere classified: Secondary | ICD-10-CM | POA: Diagnosis not present

## 2017-10-15 DIAGNOSIS — R600 Localized edema: Secondary | ICD-10-CM | POA: Diagnosis not present

## 2017-10-15 DIAGNOSIS — G8929 Other chronic pain: Secondary | ICD-10-CM

## 2017-10-15 DIAGNOSIS — M25562 Pain in left knee: Principal | ICD-10-CM

## 2017-10-15 NOTE — Therapy (Signed)
[facility] [place], [place], [address] Phone: [phone]   Fax:  [phone]  Physical Therapy Treatment  Patient Details  Name: Troy Acosta MRN: [id number] Date of Birth: 04/20/1951 Referring Provider: Esmond Plants MD   Encounter Date: 10/15/2017  PT End of Session - 10/15/17 0741    Visit Number  13    Number of Visits  18    Date for PT Re-Evaluation  11/14/17    PT Start Time  0732    PT Stop Time  0828    PT Time Calculation (min)  56 min    Activity Tolerance  Patient tolerated treatment well    Behavior During Therapy  [facility] for tasks assessed/performed       Past Medical History:  Diagnosis Date  . Anxiety   . Arthritis    "all my joints" (08/24/2017)  . Diaphragmatic hernia without mention of obstruction or gangrene   . Dysrhythmia    afib, ablation- 12/2015, no problems since, off blood thinner- 09/2016  . Essential hypertension, benign   . Headache    had MRI- was consulted with Dr. Jannifer Franklin,, headache has resolved"probably related to caffeine"  . Hyperplasia of prostate   . Iron deficiency anemia, unspecified   . Malaise and fatigue   . Obesity   . Other and unspecified hyperlipidemia   . Paroxysmal atrial fibrillation (HCC)   . PONV (postoperative nausea and vomiting)   . Unspecified hypothyroidism   . Wears dentures     Past Surgical History:  Procedure Laterality Date  . COLONOSCOPY WITH ESOPHAGOGASTRODUODENOSCOPY (EGD)    . ELECTROPHYSIOLOGIC STUDY N/A 12/24/2015   Afib ablation by Dr Rayann Heman  . JOINT REPLACEMENT    . KNEE ARTHROSCOPY Right ~ 2016  . LUNG BIOPSY  1990s   "no problems identified"  . MULTIPLE TOOTH EXTRACTIONS    . TOTAL KNEE ARTHROPLASTY Right 03/02/2017   Procedure: RIGHT TOTAL KNEE ARTHROPLASTY;  Surgeon: [name], MD;  Location: [place];  Service: Orthopedics;  Laterality: Right;  . TOTAL KNEE ARTHROPLASTY Left 08/24/2017  . TOTAL KNEE ARTHROPLASTY Left 08/24/2017   Procedure: LEFT TOTAL KNEE ARTHROPLASTY;  Surgeon: [name], MD;  Location: [place];  Service: Orthopedics;  Laterality: Left;    There were no vitals filed for this visit.  Subjective Assessment - 10/15/17 0736    Subjective  Reports that he has just now gotten on his bike at home. Reports still taking 2 pain pills prior to bed.    Pertinent History  Right total knee replacement.    Limitations  Walking    How long can you sit comfortably?  2 hours    How long can you stand comfortably?  2 hours    How long can you walk comfortably?  Short community distances.  He is not driving yet.    Diagnostic tests  X-ray     Patient Stated Goals  2hrs    Currently in Pain?  Yes    Pain Score  4     Pain Location  Knee    Pain Orientation  Left    Pain Descriptors / Indicators  Discomfort    Pain Type  Surgical pain    Pain Onset  1 to 4 weeks ago         [facility] PT Assessment - 10/15/17 0001      Assessment   Medical Diagnosis  Left total knee replacement.    Onset Date/Surgical Date  08/24/17  Hand Dominance  Right    Next MD Visit  10/23/2017      Restrictions   Weight Bearing Restrictions  No      ROM / Strength   AROM / PROM / Strength  AROM      AROM   Overall AROM   Deficits    AROM Assessment Site  Knee    Right/Left Knee  Left    Left Knee Extension  -7    Left Knee Flexion  105                  [facility] Adult PT Treatment/Exercise - 10/15/17 0001      Knee/Hip Exercises: Aerobic   Stationary Bike  L1 ,seat 13 x15 min      Knee/Hip Exercises: Machines for Strengthening   Cybex Knee Extension  20# 3x10 reps    Cybex Knee Flexion  40# 4x10 reps    Cybex Leg Press  2 pl, seat 6 x30 reps      Knee/Hip Exercises: Standing   Forward Lunges  Right;3 sets;10 reps;2 seconds    Lateral Step Up  Left;10 reps;Hand Hold: 2;Step Height: 6";3 sets    Forward Step Up  Left;10 reps;Hand Hold: 2;Step Height: 6";3 sets      Modalities   Modalities  Publishing copy Location  L knee    Electrical Stimulation Action  IFC    Electrical Stimulation Parameters  1-10 hz x15 min    Electrical Stimulation Goals  Edema;Pain      Vasopneumatic   Number Minutes Vasopneumatic   15 minutes    Vasopnuematic Location   Knee    Vasopneumatic Pressure  Medium    Vasopneumatic Temperature   51      Manual Therapy   Manual Therapy  Passive ROM    Passive ROM  PROM of L knee into flexion, extension with holds at end range                PT Short Term Goals - 09/12/17 1243      PT SHORT TERM GOAL #1   Title  STG's=LTG's.        PT Long Term Goals - 09/19/17 1129      PT LONG TERM GOAL #1   Title  Independent with a HEP.    Time  6    Period  Weeks    Status  On-going      PT LONG TERM GOAL #2   Title  Full active left knee extension in order to normalize gait.    Time  6    Period  Weeks    Status  On-going      PT LONG TERM GOAL #3   Title  Active left knee flexion to 115 degrees+ so the patient can perform functional tasks and do so with pain not > 2-3/10.    Time  6    Period  Weeks    Status  On-going      PT LONG TERM GOAL #4   Title  Increase left knee strength to a solid 5/5 to provide good stability for accomplishment of functional activities.    Time  6    Period  Weeks    Status  On-going      PT LONG TERM GOAL #5   Title  Perform a reciprocating stair gait with one railing.    Time  6  Period  Weeks    Status  On-going            Plan - 10/15/17 0819    Clinical Impression Statement  Patient tolerated today's treatment well as he was guided through strengthening exercises that would assist with ROM. No complaints of knee pain or fatigue with exercises today during treatment. Stationary bike initiated today with seat adjusted for ROM. AROM of L knee measured as 7-105 deg. Goals on-going secondary to ROM and strength deficits. Normal  modalities response noted following removal of the modalities.    Rehab Potential  Excellent    PT Frequency  2x / week    PT Duration  6 weeks    PT Treatment/Interventions  ADLs/Self Care Home Management;Cryotherapy;Electrical Stimulation;Gait training;Stair training;Functional mobility training;Therapeutic activities;Therapeutic exercise;Neuromuscular re-education;Patient/family education;Passive range of motion;Manual techniques;Vasopneumatic Device    PT Next Visit Plan  Total knee replacement protocol.  PROM.  Progress to PRE's.  Electrical stimulation and vasopneumatic.    PT Home Exercise Plan  Quad sets, hamstring stretching, (HEP provided by HHPT with standing hip exercises)    Consulted and Agree with Plan of Care  Patient       Patient will benefit from skilled therapeutic intervention in order to improve the following deficits and impairments:  Abnormal gait, Decreased activity tolerance, Decreased mobility, Decreased range of motion, Increased edema, Decreased strength, Pain  Visit Diagnosis: Chronic pain of left knee  Stiffness of left knee, not elsewhere classified  Localized edema     Problem List Patient Active Problem List   Diagnosis Date Noted  . S/P TKR (total knee replacement), left 08/24/2017  . Status post total knee replacement, right 03/02/2017  . Chronic intractable headache 08/09/2016  . Other specified hypothyroidism 03/02/2016  . A-fib ([place]) 12/24/2015  . Non-ischemic cardiomyopathy ([place]) 11/07/2015  . Long term (current) use of anticoagulants 11/07/2015  . Diaphoresis 11/01/2015  . Chest pain 11/01/2015  . PAF (paroxysmal atrial fibrillation) ([place]) 07/15/2015  . Heart palpitations 07/11/2015  . Dyspnea 02/18/2015  . Anemia, iron deficiency 09/10/2013  . Diaphragmatic hernia without mention of obstruction or gangrene   . Hypothyroidism   . Malaise and fatigue   . Arteritis, unspecified ([place])   . Iron deficiency anemia, unspecified   . Obesity    . BPH (benign prostatic hyperplasia)   . Essential hypertension, benign   . Hyperlipidemia     Standley Brooking, PTA 10/15/2017, 8:30 AM  [facility] [address] [place], [place], [address] Phone: [phone]   Fax:  [phone]  Name: Troy Acosta MRN: [id number] Date of Birth: 1951/06/22

## 2017-10-18 ENCOUNTER — Ambulatory Visit: Payer: BLUE CROSS/BLUE SHIELD | Admitting: Physical Therapy

## 2017-10-18 ENCOUNTER — Encounter: Payer: Self-pay | Admitting: Physical Therapy

## 2017-10-18 DIAGNOSIS — R600 Localized edema: Secondary | ICD-10-CM | POA: Diagnosis not present

## 2017-10-18 DIAGNOSIS — G8929 Other chronic pain: Secondary | ICD-10-CM

## 2017-10-18 DIAGNOSIS — M25662 Stiffness of left knee, not elsewhere classified: Secondary | ICD-10-CM | POA: Diagnosis not present

## 2017-10-18 DIAGNOSIS — M25562 Pain in left knee: Principal | ICD-10-CM

## 2017-10-18 NOTE — Therapy (Signed)
[facility] [place], [place], [address] Phone: [phone]   Fax:  [phone]  Physical Therapy Treatment  Patient Details  Name: Troy Acosta MRN: [id number] Date of Birth: 1950/10/25 Referring Provider: Esmond Plants MD   Encounter Date: 10/18/2017  PT End of Session - 10/18/17 0809    Visit Number  14    Number of Visits  18    Date for PT Re-Evaluation  11/14/17    PT Start Time  0730    PT Stop Time  0827    PT Time Calculation (min)  57 min    Activity Tolerance  Patient tolerated treatment well    Behavior During Therapy  [facility] for tasks assessed/performed       Past Medical History:  Diagnosis Date  . Anxiety   . Arthritis    "all my joints" (08/24/2017)  . Diaphragmatic hernia without mention of obstruction or gangrene   . Dysrhythmia    afib, ablation- 12/2015, no problems since, off blood thinner- 09/2016  . Essential hypertension, benign   . Headache    had MRI- was consulted with Dr. Jannifer Franklin,, headache has resolved"probably related to caffeine"  . Hyperplasia of prostate   . Iron deficiency anemia, unspecified   . Malaise and fatigue   . Obesity   . Other and unspecified hyperlipidemia   . Paroxysmal atrial fibrillation (HCC)   . PONV (postoperative nausea and vomiting)   . Unspecified hypothyroidism   . Wears dentures     Past Surgical History:  Procedure Laterality Date  . COLONOSCOPY WITH ESOPHAGOGASTRODUODENOSCOPY (EGD)    . ELECTROPHYSIOLOGIC STUDY N/A 12/24/2015   Afib ablation by Dr Rayann Heman  . JOINT REPLACEMENT    . KNEE ARTHROSCOPY Right ~ 2016  . LUNG BIOPSY  1990s   "no problems identified"  . MULTIPLE TOOTH EXTRACTIONS    . TOTAL KNEE ARTHROPLASTY Right 03/02/2017   Procedure: RIGHT TOTAL KNEE ARTHROPLASTY;  Surgeon: [name], MD;  Location: [place];  Service: Orthopedics;  Laterality: Right;  . TOTAL KNEE ARTHROPLASTY Left 08/24/2017  . TOTAL KNEE ARTHROPLASTY Left 08/24/2017   Procedure: LEFT TOTAL KNEE ARTHROPLASTY;  Surgeon: [name], MD;  Location: [place];  Service: Orthopedics;  Laterality: Left;    There were no vitals filed for this visit.  Subjective Assessment - 10/18/17 0744    Subjective  Patient arrived with c/o stiffness in knee    Pertinent History  Right total knee replacement.    Limitations  Walking    How long can you sit comfortably?  2 hours    How long can you stand comfortably?  2 hours    How long can you walk comfortably?  Short community distances.  He is not driving yet.    Diagnostic tests  X-ray     Patient Stated Goals  2hrs    Currently in Pain?  Yes    Pain Score  5     Pain Location  Knee    Pain Orientation  Left    Pain Descriptors / Indicators  Discomfort;Tightness;Sore    Pain Type  Surgical pain    Pain Onset  1 to 4 weeks ago    Pain Frequency  Constant    Aggravating Factors   any prolong activity    Pain Relieving Factors  at rest and meds         [facility] PT Assessment - 10/18/17 0001      AROM   AROM Assessment Site  Knee    Right/Left Knee  Left    Left Knee Extension  -14    Left Knee Flexion  95      PROM   PROM Assessment Site  Knee    Right/Left Knee  Left    Left Knee Extension  -9    Left Knee Flexion  104                  [facility] Adult PT Treatment/Exercise - 10/18/17 0001      Knee/Hip Exercises: Aerobic   Stationary Bike  L1 ,seat 14-12 x15 min      Knee/Hip Exercises: Machines for Strengthening   Cybex Knee Extension  20# 3x10 reps    Cybex Knee Flexion  40# 4x10 reps      Electrical Stimulation   Electrical Stimulation Location  L knee    Electrical Stimulation Action  IFC    Electrical Stimulation Parameters  1-10hz  x40min    Electrical Stimulation Goals  Edema;Pain      Vasopneumatic   Number Minutes Vasopneumatic   15 minutes    Vasopnuematic Location   Knee    Vasopneumatic Pressure  Medium      Manual Therapy   Manual Therapy  Passive ROM    Passive ROM   PROM of L knee into flexion, extension with holds at end range                PT Short Term Goals - 09/12/17 1243      PT SHORT TERM GOAL #1   Title  STG's=LTG's.        PT Long Term Goals - 09/19/17 1129      PT LONG TERM GOAL #1   Title  Independent with a HEP.    Time  6    Period  Weeks    Status  On-going      PT LONG TERM GOAL #2   Title  Full active left knee extension in order to normalize gait.    Time  6    Period  Weeks    Status  On-going      PT LONG TERM GOAL #3   Title  Active left knee flexion to 115 degrees+ so the patient can perform functional tasks and do so with pain not > 2-3/10.    Time  6    Period  Weeks    Status  On-going      PT LONG TERM GOAL #4   Title  Increase left knee strength to a solid 5/5 to provide good stability for accomplishment of functional activities.    Time  6    Period  Weeks    Status  On-going      PT LONG TERM GOAL #5   Title  Perform a reciprocating stair gait with one railing.    Time  6    Period  Weeks    Status  On-going            Plan - 10/18/17 0810    Clinical Impression Statement  Patient tolerated treatment well today. Patient reported he dropped soap bar in shower and picked it up causing a pain" catch" in low back. Patient has ongoing reported c/o tightness and discomfort in right knee. Today focused on ROM for right knee flexion and esp ext manual stretching. Patient able to improve range after stetching today. Patient returns to MD next week for f/u appt. Current goals ongoing.    Rehab  Potential  Excellent    PT Frequency  2x / week    PT Duration  6 weeks    PT Treatment/Interventions  ADLs/Self Care Home Management;Cryotherapy;Electrical Stimulation;Gait training;Stair training;Functional mobility training;Therapeutic activities;Therapeutic exercise;Neuromuscular re-education;Patient/family education;Passive range of motion;Manual techniques;Vasopneumatic Device    PT Next Visit Plan   Total knee replacement protocol.  PROM.  Progress to PRE's.  Electrical stimulation and vasopneumatic. (MD. Veverly Fells 10/23/17)    Consulted and Agree with Plan of Care  Patient       Patient will benefit from skilled therapeutic intervention in order to improve the following deficits and impairments:  Abnormal gait, Decreased activity tolerance, Decreased mobility, Decreased range of motion, Increased edema, Decreased strength, Pain  Visit Diagnosis: Chronic pain of left knee  Stiffness of left knee, not elsewhere classified  Localized edema     Problem List Patient Active Problem List   Diagnosis Date Noted  . S/P TKR (total knee replacement), left 08/24/2017  . Status post total knee replacement, right 03/02/2017  . Chronic intractable headache 08/09/2016  . Other specified hypothyroidism 03/02/2016  . A-fib ([place]) 12/24/2015  . Non-ischemic cardiomyopathy ([place]) 11/07/2015  . Long term (current) use of anticoagulants 11/07/2015  . Diaphoresis 11/01/2015  . Chest pain 11/01/2015  . PAF (paroxysmal atrial fibrillation) ([place]) 07/15/2015  . Heart palpitations 07/11/2015  . Dyspnea 02/18/2015  . Anemia, iron deficiency 09/10/2013  . Diaphragmatic hernia without mention of obstruction or gangrene   . Hypothyroidism   . Malaise and fatigue   . Arteritis, unspecified ([place])   . Iron deficiency anemia, unspecified   . Obesity   . BPH (benign prostatic hyperplasia)   . Essential hypertension, benign   . Hyperlipidemia     Caitlin Ainley P, PTA 10/18/2017, 8:45 AM  [facility], [place], [address] Phone: [phone]   Fax:  [phone]  Name: Pavle Wiler MRN: [id number] Date of Birth: April 07, 1951

## 2017-10-19 ENCOUNTER — Encounter: Payer: Self-pay | Admitting: Nurse Practitioner

## 2017-10-19 ENCOUNTER — Ambulatory Visit: Payer: BLUE CROSS/BLUE SHIELD | Admitting: Nurse Practitioner

## 2017-10-19 VITALS — BP 130/85 | HR 75 | Temp 98.7°F | Ht 71.0 in | Wt 251.0 lb

## 2017-10-19 DIAGNOSIS — M545 Low back pain, unspecified: Secondary | ICD-10-CM

## 2017-10-19 MED ORDER — METHYLPREDNISOLONE ACETATE 80 MG/ML IJ SUSP
80.0000 mg | Freq: Once | INTRAMUSCULAR | Status: AC
  Administered 2017-10-19: 80 mg via INTRAMUSCULAR

## 2017-10-19 NOTE — Progress Notes (Signed)
   Subjective:    Patient ID: Troy Acosta, male    DOB: Feb 15, 1951, 67 y.o.   MRN: [id number]  HPI  Patient come sin today c/o back pain. He said he bent over to pick up a bar of soap and pulled something in his back yesterday. Has gotten no better today despite taking flexeril and hydrocodone ( he has these from his knee surgery 2 months ago). He has also been laying around on heating pad. Pain is constant in lower back, does not radiate. Feels better if laying flat   Review of Systems  Constitutional: Negative.   HENT: Negative.   Respiratory: Negative.   Cardiovascular: Negative.   Musculoskeletal: Positive for back pain.  Neurological: Negative.   Psychiatric/Behavioral: Negative.   All other systems reviewed and are negative.      Objective:   Physical Exam  Constitutional: He is oriented to person, place, and time. He appears well-developed and well-nourished. No distress.  Cardiovascular: Normal rate and regular rhythm.  Pulmonary/Chest: Effort normal and breath sounds normal.  Abdominal: Soft. Bowel sounds are normal.  Musculoskeletal:  FROM of lumbar spine with pain on flexion and extension. Pain is worse when going from sitting to standing (-) SLR bil Motor strength and sensation distally intact  Neurological: He is alert and oriented to person, place, and time.  Skin: Skin is warm.  Psychiatric: He has a normal mood and affect. His behavior is normal. Judgment and thought content normal.   BP 130/85   Pulse 75   Temp 98.7 F (37.1 C) (Oral)   Ht 5\' 11"  (1.803 m)   Wt 251 lb (113.9 kg)   BMI 35.01 kg/m       Assessment & Plan:   1. Acute left-sided low back pain without sciatica    Meds ordered this encounter  Medications  . methylPREDNISolone acetate (DEPO-MEDROL) injection 80 mg   Continue robaxin and oain meds as rx Continue heat Back stretches RTO prn  Mary-Margaret Hassell Done, FNP

## 2017-10-19 NOTE — Patient Instructions (Signed)

## 2017-10-22 ENCOUNTER — Encounter: Payer: Self-pay | Admitting: Physical Therapy

## 2017-10-22 ENCOUNTER — Ambulatory Visit: Payer: BLUE CROSS/BLUE SHIELD | Admitting: Physical Therapy

## 2017-10-22 DIAGNOSIS — M25662 Stiffness of left knee, not elsewhere classified: Secondary | ICD-10-CM | POA: Diagnosis not present

## 2017-10-22 DIAGNOSIS — R600 Localized edema: Secondary | ICD-10-CM | POA: Diagnosis not present

## 2017-10-22 DIAGNOSIS — G8929 Other chronic pain: Secondary | ICD-10-CM

## 2017-10-22 DIAGNOSIS — M25562 Pain in left knee: Principal | ICD-10-CM

## 2017-10-22 NOTE — Therapy (Signed)
[facility] [place], [place], [address] Phone: [phone]   Fax:  [phone]  Physical Therapy Treatment  Patient Details  Name: Troy Acosta MRN: [id number] Date of Birth: 01-Nov-1950 Referring Provider: Esmond Plants MD   Encounter Date: 10/22/2017  PT End of Session - 10/22/17 0734    Visit Number  15    Number of Visits  18    Date for PT Re-Evaluation  11/14/17    PT Start Time  0731    PT Stop Time  0825    PT Time Calculation (min)  54 min    Activity Tolerance  Patient tolerated treatment well    Behavior During Therapy  [facility] for tasks assessed/performed       Past Medical History:  Diagnosis Date  . Anxiety   . Arthritis    "all my joints" (08/24/2017)  . Diaphragmatic hernia without mention of obstruction or gangrene   . Dysrhythmia    afib, ablation- 12/2015, no problems since, off blood thinner- 09/2016  . Essential hypertension, benign   . Headache    had MRI- was consulted with Dr. Jannifer Franklin,, headache has resolved"probably related to caffeine"  . Hyperplasia of prostate   . Iron deficiency anemia, unspecified   . Malaise and fatigue   . Obesity   . Other and unspecified hyperlipidemia   . Paroxysmal atrial fibrillation (HCC)   . PONV (postoperative nausea and vomiting)   . Unspecified hypothyroidism   . Wears dentures     Past Surgical History:  Procedure Laterality Date  . COLONOSCOPY WITH ESOPHAGOGASTRODUODENOSCOPY (EGD)    . ELECTROPHYSIOLOGIC STUDY N/A 12/24/2015   Afib ablation by Dr Rayann Heman  . JOINT REPLACEMENT    . KNEE ARTHROSCOPY Right ~ 2016  . LUNG BIOPSY  1990s   "no problems identified"  . MULTIPLE TOOTH EXTRACTIONS    . TOTAL KNEE ARTHROPLASTY Right 03/02/2017   Procedure: RIGHT TOTAL KNEE ARTHROPLASTY;  Surgeon: [name], MD;  Location: [place];  Service: Orthopedics;  Laterality: Right;  . TOTAL KNEE ARTHROPLASTY Left 08/24/2017  . TOTAL KNEE ARTHROPLASTY Left 08/24/2017   Procedure: LEFT TOTAL KNEE ARTHROPLASTY;  Surgeon: [name], MD;  Location: [place];  Service: Orthopedics;  Laterality: Left;    There were no vitals filed for this visit.  Subjective Assessment - 10/22/17 0733    Subjective  Reports that he has done something to his back and reports that he had a shot put in it last week. Reports he has been unable to do any exercises at home since back injury.    Pertinent History  Right total knee replacement.    Limitations  Walking    How long can you sit comfortably?  2 hours    How long can you stand comfortably?  2 hours    How long can you walk comfortably?  Short community distances.  He is not driving yet.    Diagnostic tests  X-ray     Patient Stated Goals  2hrs    Currently in Pain?  Yes    Pain Score  5     Pain Location  Knee    Pain Orientation  Left    Pain Descriptors / Indicators  Discomfort    Pain Type  Surgical pain    Pain Onset  1 to 4 weeks ago    Pain Frequency  Constant         [facility] PT Assessment - 10/22/17 0001  Assessment   Medical Diagnosis  Left total knee replacement.    Onset Date/Surgical Date  08/24/17    Hand Dominance  Right    Next MD Visit  10/23/2017      Restrictions   Weight Bearing Restrictions  No      ROM / Strength   AROM / PROM / Strength  AROM      AROM   Overall AROM   Deficits    AROM Assessment Site  Knee    Right/Left Knee  Left    Left Knee Extension  -7    Left Knee Flexion  105                  [facility] Adult PT Treatment/Exercise - 10/22/17 0001      Knee/Hip Exercises: Aerobic   Stationary Bike  L1, seat 11 x15 min      Knee/Hip Exercises: Machines for Strengthening   Cybex Knee Extension  20# 3x10 reps    Cybex Knee Flexion  40# 4x10 reps      Knee/Hip Exercises: Standing   Forward Lunges  Left;2 sets;10 reps;3 seconds    Hip Abduction  AROM;Both;2 sets;10 reps;Knee straight    Lateral Step Up  Left;2 sets;10 reps;Hand Hold: 2;Step Height: 8"     Forward Step Up  Left;2 sets;10 reps;Hand Hold: 2;Step Height: 8"      Modalities   Modalities  Psychologist, educational Location  L knee    Electrical Stimulation Action  IFC    Electrical Stimulation Parameters  1-10 hz x15 min    Electrical Stimulation Goals  Pain      Vasopneumatic   Number Minutes Vasopneumatic   15 minutes    Vasopnuematic Location   Knee    Vasopneumatic Pressure  Medium    Vasopneumatic Temperature   34      Manual Therapy   Manual Therapy  Passive ROM    Passive ROM  PROM of L knee into flexion, extension with holds at end range                PT Short Term Goals - 09/12/17 1243      PT SHORT TERM GOAL #1   Title  STG's=LTG's.        PT Long Term Goals - 10/22/17 0813      PT LONG TERM GOAL #1   Title  Independent with a HEP.    Time  6    Period  Weeks    Status  On-going      PT LONG TERM GOAL #2   Title  Full active left knee extension in order to normalize gait.    Time  6    Period  Weeks    Status  On-going AROM L knee extension -7 deg from neutral 10/22/2017      PT LONG TERM GOAL #3   Title  Active left knee flexion to 115 degrees+ so the patient can perform functional tasks and do so with pain not > 2-3/10.    Time  6    Period  Weeks    Status  On-going AROM L knee flexion 105 deg 10/22/2017      PT LONG TERM GOAL #4   Title  Increase left knee strength to a solid 5/5 to provide good stability for accomplishment of functional activities.    Time  6    Period  Weeks  Status  On-going      PT LONG TERM GOAL #5   Title  Perform a reciprocating stair gait with one railing.    Time  6    Period  Weeks    Status  On-going            Plan - 10/22/17 9735    Clinical Impression Statement  Patient tolerated today's treatment fairly well as he sustained a L low back injury last week. Patient unable to complete exercises at home secondary to pain.  Patient able to complete exercises as directed in clinic today without any complaint of any increased L knee pain or LBP. Patient continues to report discomfort with PROM of the L knee. AROM of L knee measured as 7-105 deg. Normal modalities response noted following removal of the modalities.     Rehab Potential  Excellent    PT Frequency  2x / week    PT Duration  6 weeks    PT Treatment/Interventions  ADLs/Self Care Home Management;Cryotherapy;Electrical Stimulation;Gait training;Stair training;Functional mobility training;Therapeutic activities;Therapeutic exercise;Neuromuscular re-education;Patient/family education;Passive range of motion;Manual techniques;Vasopneumatic Device    PT Next Visit Plan  Total knee replacement protocol.  PROM.  Progress to PRE's.  Electrical stimulation and vasopneumatic. (MD. Veverly Fells 10/23/17)    PT Home Exercise Plan  Quad sets, hamstring stretching, (HEP provided by HHPT with standing hip exercises)    Consulted and Agree with Plan of Care  Patient       Patient will benefit from skilled therapeutic intervention in order to improve the following deficits and impairments:  Abnormal gait, Decreased activity tolerance, Decreased mobility, Decreased range of motion, Increased edema, Decreased strength, Pain  Visit Diagnosis: Chronic pain of left knee  Stiffness of left knee, not elsewhere classified  Localized edema     Problem List Patient Active Problem List   Diagnosis Date Noted  . S/P TKR (total knee replacement), left 08/24/2017  . Status post total knee replacement, right 03/02/2017  . Chronic intractable headache 08/09/2016  . Other specified hypothyroidism 03/02/2016  . A-fib ([place]) 12/24/2015  . Non-ischemic cardiomyopathy ([place]) 11/07/2015  . Long term (current) use of anticoagulants 11/07/2015  . Diaphoresis 11/01/2015  . Chest pain 11/01/2015  . PAF (paroxysmal atrial fibrillation) ([place]) 07/15/2015  . Heart palpitations 07/11/2015  .  Dyspnea 02/18/2015  . Anemia, iron deficiency 09/10/2013  . Diaphragmatic hernia without mention of obstruction or gangrene   . Hypothyroidism   . Malaise and fatigue   . Arteritis, unspecified ([place])   . Iron deficiency anemia, unspecified   . Obesity   . BPH (benign prostatic hyperplasia)   . Essential hypertension, benign   . Hyperlipidemia     Standley Brooking, PTA 10/22/17 8:28 AM   [facility] [address] [place], [place], [address] Phone: [phone]   Fax:  [phone]  Name: Troy Acosta MRN: [id number] Date of Birth: [birth year]

## 2017-10-25 ENCOUNTER — Encounter: Payer: Self-pay | Admitting: Physical Therapy

## 2017-10-25 ENCOUNTER — Ambulatory Visit: Payer: BLUE CROSS/BLUE SHIELD | Admitting: Physical Therapy

## 2017-10-25 DIAGNOSIS — M25662 Stiffness of left knee, not elsewhere classified: Secondary | ICD-10-CM | POA: Diagnosis not present

## 2017-10-25 DIAGNOSIS — M25562 Pain in left knee: Principal | ICD-10-CM

## 2017-10-25 DIAGNOSIS — G8929 Other chronic pain: Secondary | ICD-10-CM | POA: Diagnosis not present

## 2017-10-25 DIAGNOSIS — R600 Localized edema: Secondary | ICD-10-CM | POA: Diagnosis not present

## 2017-10-25 NOTE — Therapy (Signed)
[facility] [place], [place], [address] Phone: [phone]   Fax:  [phone]  Physical Therapy Treatment  Patient Details  Name: Troy Acosta MRN: [id number] Date of Birth: Sep 11, 1951 Referring Provider: Esmond Plants MD   Encounter Date: 10/25/2017  PT End of Session - 10/25/17 0744    Visit Number  16    Number of Visits  18    Date for PT Re-Evaluation  11/14/17    PT Start Time  0731    PT Stop Time  0823    PT Time Calculation (min)  52 min    Activity Tolerance  Patient tolerated treatment well    Behavior During Therapy  [facility] for tasks assessed/performed       Past Medical History:  Diagnosis Date  . Anxiety   . Arthritis    "all my joints" (08/24/2017)  . Diaphragmatic hernia without mention of obstruction or gangrene   . Dysrhythmia    afib, ablation- 12/2015, no problems since, off blood thinner- 09/2016  . Essential hypertension, benign   . Headache    had MRI- was consulted with Dr. Jannifer Franklin,, headache has resolved"probably related to caffeine"  . Hyperplasia of prostate   . Iron deficiency anemia, unspecified   . Malaise and fatigue   . Obesity   . Other and unspecified hyperlipidemia   . Paroxysmal atrial fibrillation (HCC)   . PONV (postoperative nausea and vomiting)   . Unspecified hypothyroidism   . Wears dentures     Past Surgical History:  Procedure Laterality Date  . COLONOSCOPY WITH ESOPHAGOGASTRODUODENOSCOPY (EGD)    . ELECTROPHYSIOLOGIC STUDY N/A 12/24/2015   Afib ablation by Dr Rayann Heman  . JOINT REPLACEMENT    . KNEE ARTHROSCOPY Right ~ 2016  . LUNG BIOPSY  1990s   "no problems identified"  . MULTIPLE TOOTH EXTRACTIONS    . TOTAL KNEE ARTHROPLASTY Right 03/02/2017   Procedure: RIGHT TOTAL KNEE ARTHROPLASTY;  Surgeon: [name], MD;  Location: [place];  Service: Orthopedics;  Laterality: Right;  . TOTAL KNEE ARTHROPLASTY Left 08/24/2017  . TOTAL KNEE ARTHROPLASTY Left 08/24/2017    Procedure: LEFT TOTAL KNEE ARTHROPLASTY;  Surgeon: [name], MD;  Location: [place];  Service: Orthopedics;  Laterality: Left;    There were no vitals filed for this visit.  Subjective Assessment - 10/25/17 0743    Subjective  Reports that MD says that he will have a manipulation next wednesday and thinks that his back issues is from his knee issues.    Pertinent History  Right total knee replacement.    Limitations  Walking    How long can you sit comfortably?  2 hours    How long can you stand comfortably?  2 hours    How long can you walk comfortably?  Short community distances.  He is not driving yet.    Diagnostic tests  X-ray     Patient Stated Troy Acosta         [facility] PT Assessment - 10/25/17 0001      Assessment   Medical Diagnosis  Left total knee replacement.    Onset Date/Surgical Date  08/24/17    Hand Dominance  Right    Next MD Visit  10/31/2017      Restrictions   Weight Bearing Restrictions  No                  [facility] Adult PT Treatment/Exercise - 10/25/17 0001  Knee/Hip Exercises: Aerobic   Stationary Bike  L3 x15 min      Knee/Hip Exercises: Machines for Strengthening   Cybex Knee Extension  20# 3x10 reps    Cybex Knee Flexion  40# 4x10 reps    Cybex Leg Press  3 pl, seat 9 x30 reps      Knee/Hip Exercises: Standing   Forward Lunges  Left;2 sets;10 reps;3 seconds    Terminal Knee Extension  Strengthening;Left;20 reps;Limitations    Terminal Knee Extension Limitations  Pink XTS      Modalities   Modalities  Electrical Stimulation;Vasopneumatic      Electrical Stimulation   Electrical Stimulation Location  L knee    Electrical Stimulation Action  IFC    Electrical Stimulation Parameters  1-10 hz x15 min    Electrical Stimulation Goals  Pain      Vasopneumatic   Number Minutes Vasopneumatic   15 minutes    Vasopnuematic Location   Knee    Vasopneumatic Pressure  Medium    Vasopneumatic Temperature   34                PT Short Term Goals - 09/12/17 1243      PT SHORT TERM GOAL #1   Title  STG's=LTG's.        PT Long Term Goals - 10/22/17 0813      PT LONG TERM GOAL #1   Title  Independent with a HEP.    Time  6    Period  Weeks    Status  On-going      PT LONG TERM GOAL #2   Title  Full active left knee extension in order to normalize gait.    Time  6    Period  Weeks    Status  On-going AROM L knee extension -7 deg from neutral 10/22/2017      PT LONG TERM GOAL #3   Title  Active left knee flexion to 115 degrees+ so the patient can perform functional tasks and do so with pain not > 2-3/10.    Time  6    Period  Weeks    Status  On-going AROM L knee flexion 105 deg 10/22/2017      PT LONG TERM GOAL #4   Title  Increase left knee strength to a solid 5/5 to provide good stability for accomplishment of functional activities.    Time  6    Period  Weeks    Status  On-going      PT LONG TERM GOAL #5   Title  Perform a reciprocating stair gait with one railing.    Time  6    Period  Weeks    Status  On-going            Plan - 10/25/17 0810    Clinical Impression Statement  Patient tolerated today's treatment well with reports upon arrival of manipulation of his L knee. Patient able to complete exercises as directed without reports of any pain with exercises. Limited progression since L TKR secondary to pain and ROM limits. Normal modalities response noted following removal of the modalities.    Rehab Potential  Excellent    PT Frequency  2x / week    PT Duration  6 weeks    PT Treatment/Interventions  ADLs/Self Care Home Management;Cryotherapy;Electrical Stimulation;Gait training;Stair training;Functional mobility training;Therapeutic activities;Therapeutic exercise;Neuromuscular re-education;Patient/family education;Passive range of motion;Manual techniques;Vasopneumatic Device    PT Next Visit Plan  Total knee replacement protocol.  PROM.  Progress to PRE's.   Electrical stimulation and vasopneumatic. (MD. Veverly Fells 10/23/17)    PT Home Exercise Plan  Quad sets, hamstring stretching, (HEP provided by HHPT with standing hip exercises)    Consulted and Agree with Plan of Care  Patient       Patient will benefit from skilled therapeutic intervention in order to improve the following deficits and impairments:  Abnormal gait, Decreased activity tolerance, Decreased mobility, Decreased range of motion, Increased edema, Decreased strength, Pain  Visit Diagnosis: Chronic pain of left knee  Stiffness of left knee, not elsewhere classified  Localized edema     Problem List Patient Active Problem List   Diagnosis Date Noted  . S/P TKR (total knee replacement), left 08/24/2017  . Status post total knee replacement, right 03/02/2017  . Chronic intractable headache 08/09/2016  . Other specified hypothyroidism 03/02/2016  . A-fib ([place]) 12/24/2015  . Non-ischemic cardiomyopathy ([place]) 11/07/2015  . Long term (current) use of anticoagulants 11/07/2015  . Diaphoresis 11/01/2015  . Chest pain 11/01/2015  . PAF (paroxysmal atrial fibrillation) ([place]) 07/15/2015  . Heart palpitations 07/11/2015  . Dyspnea 02/18/2015  . Anemia, iron deficiency 09/10/2013  . Diaphragmatic hernia without mention of obstruction or gangrene   . Hypothyroidism   . Malaise and fatigue   . Arteritis, unspecified ([place])   . Iron deficiency anemia, unspecified   . Obesity   . BPH (benign prostatic hyperplasia)   . Essential hypertension, benign   . Hyperlipidemia     Standley Brooking, PTA 10/25/2017, 8:53 AM  [facility] [address] [place], [place], [address] Phone: [phone]   Fax:  [phone]  Name: Daxtin Leiker MRN: [id number] Date of Birth: [birth year]

## 2017-10-29 ENCOUNTER — Ambulatory Visit: Payer: BLUE CROSS/BLUE SHIELD | Admitting: Physical Therapy

## 2017-10-29 ENCOUNTER — Encounter: Payer: Self-pay | Admitting: Physical Therapy

## 2017-10-29 DIAGNOSIS — R600 Localized edema: Secondary | ICD-10-CM | POA: Diagnosis not present

## 2017-10-29 DIAGNOSIS — G8929 Other chronic pain: Secondary | ICD-10-CM | POA: Diagnosis not present

## 2017-10-29 DIAGNOSIS — M25662 Stiffness of left knee, not elsewhere classified: Secondary | ICD-10-CM | POA: Diagnosis not present

## 2017-10-29 DIAGNOSIS — M25562 Pain in left knee: Principal | ICD-10-CM

## 2017-10-29 NOTE — Therapy (Signed)
[facility] [place], [place], [address] Phone: [phone]   Fax:  [phone]  Physical Therapy Treatment  Patient Details  Name: Troy Acosta MRN: [id number] Date of Birth: 1951-01-10 Referring Provider: Esmond Plants MD   Encounter Date: 10/29/2017  PT End of Session - 10/29/17 0740    Visit Number  17    Number of Visits  18    Date for PT Re-Evaluation  11/14/17    PT Start Time  0734    PT Stop Time  0827    PT Time Calculation (min)  53 min    Activity Tolerance  Patient tolerated treatment well    Behavior During Therapy  [facility] for tasks assessed/performed       Past Medical History:  Diagnosis Date  . Anxiety   . Arthritis    "all my joints" (08/24/2017)  . Diaphragmatic hernia without mention of obstruction or gangrene   . Dysrhythmia    afib, ablation- 12/2015, no problems since, off blood thinner- 09/2016  . Essential hypertension, benign   . Headache    had MRI- was consulted with Dr. Jannifer Franklin,, headache has resolved"probably related to caffeine"  . Hyperplasia of prostate   . Iron deficiency anemia, unspecified   . Malaise and fatigue   . Obesity   . Other and unspecified hyperlipidemia   . Paroxysmal atrial fibrillation (HCC)   . PONV (postoperative nausea and vomiting)   . Unspecified hypothyroidism   . Wears dentures     Past Surgical History:  Procedure Laterality Date  . COLONOSCOPY WITH ESOPHAGOGASTRODUODENOSCOPY (EGD)    . ELECTROPHYSIOLOGIC STUDY N/A 12/24/2015   Afib ablation by Dr Rayann Heman  . JOINT REPLACEMENT    . KNEE ARTHROSCOPY Right ~ 2016  . LUNG BIOPSY  1990s   "no problems identified"  . MULTIPLE TOOTH EXTRACTIONS    . TOTAL KNEE ARTHROPLASTY Right 03/02/2017   Procedure: RIGHT TOTAL KNEE ARTHROPLASTY;  Surgeon: [name], MD;  Location: [place];  Service: Orthopedics;  Laterality: Right;  . TOTAL KNEE ARTHROPLASTY Left 08/24/2017  . TOTAL KNEE ARTHROPLASTY Left 08/24/2017    Procedure: LEFT TOTAL KNEE ARTHROPLASTY;  Surgeon: [name], MD;  Location: [place];  Service: Orthopedics;  Laterality: Left;    There were no vitals filed for this visit.  Subjective Assessment - 10/29/17 0739    Subjective  Reports that his manipulation is Wednesday. "I wish it was today."    Pertinent History  Right total knee replacement.    Limitations  Walking    How long can you sit comfortably?  2 hours    How long can you stand comfortably?  2 hours    How long can you walk comfortably?  Short community distances.  He is not driving yet.    Diagnostic tests  X-ray     Patient Stated Goals  2hrs    Currently in Pain?  Other (Comment) No pain assessment provided by patient         [facility] PT Assessment - 10/29/17 0001      Assessment   Medical Diagnosis  Left total knee replacement.    Onset Date/Surgical Date  08/24/17    Hand Dominance  Right    Next MD Visit  10/31/2017 for manipulation      Restrictions   Weight Bearing Restrictions  No      ROM / Strength   AROM / PROM / Strength  AROM      AROM  Overall AROM   Deficits    AROM Assessment Site  Knee    Right/Left Knee  Left    Left Knee Extension  -12    Left Knee Flexion  105                  [facility] Adult PT Treatment/Exercise - 10/29/17 0001      Knee/Hip Exercises: Stretches   Passive Hamstring Stretch  Left;3 reps;30 seconds      Knee/Hip Exercises: Aerobic   Stationary Bike  L1 x12 min      Knee/Hip Exercises: Machines for Strengthening   Cybex Knee Extension  20# 3x10 reps    Cybex Knee Flexion  40# 3x10 reps    Cybex Leg Press  3 pl, seat 9 x30 reps      Knee/Hip Exercises: Standing   Wall Squat  20 reps      Modalities   Modalities  Psychologist, educational Location  L knee    Electrical Stimulation Action  IFC    Electrical Stimulation Parameters  1-10 hz x15 min    Electrical Stimulation Goals  Pain       Vasopneumatic   Number Minutes Vasopneumatic   15 minutes    Vasopnuematic Location   Knee    Vasopneumatic Pressure  Medium    Vasopneumatic Temperature   34      Manual Therapy   Manual Therapy  Passive ROM    Passive ROM  PROM of L knee into flexion, extension with holds at end range                PT Short Term Goals - 09/12/17 1243      PT SHORT TERM GOAL #1   Title  STG's=LTG's.        PT Long Term Goals - 10/22/17 0813      PT LONG TERM GOAL #1   Title  Independent with a HEP.    Time  6    Period  Weeks    Status  On-going      PT LONG TERM GOAL #2   Title  Full active left knee extension in order to normalize gait.    Time  6    Period  Weeks    Status  On-going AROM L knee extension -7 deg from neutral 10/22/2017      PT LONG TERM GOAL #3   Title  Active left knee flexion to 115 degrees+ so the patient can perform functional tasks and do so with pain not > 2-3/10.    Time  6    Period  Weeks    Status  On-going AROM L knee flexion 105 deg 10/22/2017      PT LONG TERM GOAL #4   Title  Increase left knee strength to a solid 5/5 to provide good stability for accomplishment of functional activities.    Time  6    Period  Weeks    Status  On-going      PT LONG TERM GOAL #5   Title  Perform a reciprocating stair gait with one railing.    Time  6    Period  Weeks    Status  On-going            Plan - 10/29/17 4132    Clinical Impression Statement  Patient still limited with L knee ROM which patient is scheduled for a L knee  manipulation on 10/31/2017. Patient able to complete all exercises as directed although limited with ROM. AROM of L knee measured as 12-105 deg after HS stretch and PROM. Normal modalities response noted following removal of the modalites.    Rehab Potential  Excellent    PT Frequency  2x / week    PT Duration  6 weeks    PT Treatment/Interventions  ADLs/Self Care Home Management;Cryotherapy;Electrical Stimulation;Gait  training;Stair training;Functional mobility training;Therapeutic activities;Therapeutic exercise;Neuromuscular re-education;Patient/family education;Passive range of motion;Manual techniques;Vasopneumatic Device    PT Next Visit Plan  Continue per MPT POC following manipulation.    PT Home Exercise Plan  Quad sets, hamstring stretching, (HEP provided by HHPT with standing hip exercises)    Consulted and Agree with Plan of Care  Patient       Patient will benefit from skilled therapeutic intervention in order to improve the following deficits and impairments:  Abnormal gait, Decreased activity tolerance, Decreased mobility, Decreased range of motion, Increased edema, Decreased strength, Pain  Visit Diagnosis: Chronic pain of left knee  Stiffness of left knee, not elsewhere classified  Localized edema     Problem List Patient Active Problem List   Diagnosis Date Noted  . S/P TKR (total knee replacement), left 08/24/2017  . Status post total knee replacement, right 03/02/2017  . Chronic intractable headache 08/09/2016  . Other specified hypothyroidism 03/02/2016  . A-fib ([place]) 12/24/2015  . Non-ischemic cardiomyopathy ([place]) 11/07/2015  . Long term (current) use of anticoagulants 11/07/2015  . Diaphoresis 11/01/2015  . Chest pain 11/01/2015  . PAF (paroxysmal atrial fibrillation) ([place]) 07/15/2015  . Heart palpitations 07/11/2015  . Dyspnea 02/18/2015  . Anemia, iron deficiency 09/10/2013  . Diaphragmatic hernia without mention of obstruction or gangrene   . Hypothyroidism   . Malaise and fatigue   . Arteritis, unspecified ([place])   . Iron deficiency anemia, unspecified   . Obesity   . BPH (benign prostatic hyperplasia)   . Essential hypertension, benign   . Hyperlipidemia     Standley Brooking, PTA 10/29/2017, 8:34 AM  [facility] [address] [place], [place], [address] Phone: [phone]   Fax:  [phone]  Name: Beatriz Settles MRN: [id number] Date of Birth: 28-Aug-1951

## 2017-10-31 DIAGNOSIS — Z96652 Presence of left artificial knee joint: Secondary | ICD-10-CM | POA: Diagnosis not present

## 2017-10-31 DIAGNOSIS — M25562 Pain in left knee: Secondary | ICD-10-CM | POA: Diagnosis not present

## 2017-10-31 DIAGNOSIS — M24662 Ankylosis, left knee: Secondary | ICD-10-CM | POA: Diagnosis not present

## 2017-10-31 DIAGNOSIS — G8918 Other acute postprocedural pain: Secondary | ICD-10-CM | POA: Diagnosis not present

## 2017-11-01 ENCOUNTER — Ambulatory Visit: Payer: BLUE CROSS/BLUE SHIELD | Admitting: Physical Therapy

## 2017-11-01 DIAGNOSIS — M25662 Stiffness of left knee, not elsewhere classified: Secondary | ICD-10-CM | POA: Diagnosis not present

## 2017-11-01 DIAGNOSIS — M25562 Pain in left knee: Principal | ICD-10-CM

## 2017-11-01 DIAGNOSIS — G8929 Other chronic pain: Secondary | ICD-10-CM | POA: Diagnosis not present

## 2017-11-01 DIAGNOSIS — R600 Localized edema: Secondary | ICD-10-CM | POA: Diagnosis not present

## 2017-11-01 NOTE — Therapy (Signed)
[facility] [place], [place], [address] Phone: [phone]   Fax:  [phone]  Physical Therapy Treatment  Patient Details  Name: Troy Acosta MRN: [id number] Date of Birth: 1950-11-07 Referring Provider: Esmond Plants MD   Encounter Date: 11/01/2017  PT End of Session - 11/01/17 0734    Visit Number  18    Number of Visits  30    Date for PT Re-Evaluation  12/12/17    PT Start Time  0733    PT Stop Time  0855    PT Time Calculation (min)  82 min    Activity Tolerance  Patient tolerated treatment well    Behavior During Therapy  [facility] for tasks assessed/performed       Past Medical History:  Diagnosis Date  . Anxiety   . Arthritis    "all my joints" (08/24/2017)  . Diaphragmatic hernia without mention of obstruction or gangrene   . Dysrhythmia    afib, ablation- 12/2015, no problems since, off blood thinner- 09/2016  . Essential hypertension, benign   . Headache    had MRI- was consulted with Dr. Jannifer Franklin,, headache has resolved"probably related to caffeine"  . Hyperplasia of prostate   . Iron deficiency anemia, unspecified   . Malaise and fatigue   . Obesity   . Other and unspecified hyperlipidemia   . Paroxysmal atrial fibrillation (HCC)   . PONV (postoperative nausea and vomiting)   . Unspecified hypothyroidism   . Wears dentures     Past Surgical History:  Procedure Laterality Date  . COLONOSCOPY WITH ESOPHAGOGASTRODUODENOSCOPY (EGD)    . ELECTROPHYSIOLOGIC STUDY N/A 12/24/2015   Afib ablation by Dr Rayann Heman  . JOINT REPLACEMENT    . KNEE ARTHROSCOPY Right ~ 2016  . LUNG BIOPSY  1990s   "no problems identified"  . MULTIPLE TOOTH EXTRACTIONS    . TOTAL KNEE ARTHROPLASTY Right 03/02/2017   Procedure: RIGHT TOTAL KNEE ARTHROPLASTY;  Surgeon: [name], MD;  Location: [place];  Service: Orthopedics;  Laterality: Right;  . TOTAL KNEE ARTHROPLASTY Left 08/24/2017  . TOTAL KNEE ARTHROPLASTY Left 08/24/2017    Procedure: LEFT TOTAL KNEE ARTHROPLASTY;  Surgeon: [name], MD;  Location: [place];  Service: Orthopedics;  Laterality: Left;    There were no vitals filed for this visit.  Subjective Assessment - 11/01/17 0736    Subjective  Patient reports having a manipulation yesterday. Patient arrived using crutches. He states when he bears weight on left LE, his knee buckles under. Patient reported taking pain medication prior to PT.    Pertinent History  Right total knee replacement/ Left manipulaton    Limitations  Walking;Standing    Currently in Pain?  Yes    Pain Orientation  Left    Pain Type  Surgical pain    Pain Onset  Yesterday    Pain Frequency  Constant         [facility] PT Assessment - 11/01/17 0001      ROM / Strength   AROM / PROM / Strength  PROM;AROM      AROM   Overall AROM   Deficits    AROM Assessment Site  Knee    Right/Left Knee  Left    Left Knee Extension  -7    Left Knee Flexion  107      PROM   Left Knee Extension  -5    Left Knee Flexion  110      Strength   Overall Strength  Comments  Left Straight leg raise attempted, extension lag 90 degree                  [facility] Adult PT Treatment/Exercise - 11/01/17 0001      Knee/Hip Exercises: Aerobic   Nustep  Nustep, L6 x10 mins, seat 15 then seat 13 to improve knee flexion      Knee/Hip Exercises: Supine   Quad Sets  Strengthening;Left;2 sets;10 reps 5" hold; limited quad activation    Heel Slides  AROM;Left;2 sets;20 reps    Straight Leg Raises  Strengthening attempted, unable to perform      Modalities   Modalities  Electrical engineer Stimulation Location  L Knee    Electrical Stimulation Action  VMS    Electrical Stimulation Parameters  Cocontraction 10 on/10 off for x15 min, with patient performing quad sets with bolster under knee for quad activation    Electrical Stimulation Goals  Strength      Vasopneumatic   Number Minutes Vasopneumatic    10 minutes    Vasopnuematic Location   Knee    Vasopneumatic Pressure  Medium    Vasopneumatic Temperature   34      Manual Therapy   Manual Therapy  Passive ROM    Passive ROM  PROM of L knee into flexion, extension with holds at end range              PT Education - 11/01/17 0847    Education provided  Yes    Education Details  Heel slides, knee extension, knee flexion    Person(s) Educated  Patient    Methods  Explanation;Demonstration;Handout    Comprehension  Verbalized understanding;Returned demonstration       PT Short Term Goals - 09/12/17 1243      PT SHORT TERM GOAL #1   Title  STG's=LTG's.        PT Long Term Goals - 10/22/17 0813      PT LONG TERM GOAL #1   Title  Independent with a HEP.    Time  6    Period  Weeks    Status  On-going      PT LONG TERM GOAL #2   Title  Full active left knee extension in order to normalize gait.    Time  6    Period  Weeks    Status  On-going AROM L knee extension -7 deg from neutral 10/22/2017      PT LONG TERM GOAL #3   Title  Active left knee flexion to 115 degrees+ so the patient can perform functional tasks and do so with pain not > 2-3/10.    Time  6    Period  Weeks    Status  On-going AROM L knee flexion 105 deg 10/22/2017      PT LONG TERM GOAL #4   Title  Increase left knee strength to a solid 5/5 to provide good stability for accomplishment of functional activities.    Time  6    Period  Weeks    Status  On-going      PT LONG TERM GOAL #5   Title  Perform a reciprocating stair gait with one railing.    Time  6    Period  Weeks    Status  On-going            Plan - 11/01/17 0829    Clinical Impression Statement  Patient was able to complete exercises without an increase of pain. Patient demonstrated very poor quad activation during quad exercises and patient later reported he had a nerve block during the manipulation. Patient's ROM assessed AROM 107 degrees flexion, -10 degrees extension.  Left SLR extension lag 90 degrees. Patient provided with HEP for strength and ROM. Patient instructed to ice and elevate for pain and edema control after exercise. Patient reported understanding. No adverse effects found upon removal of modalities.    History and Personal Factors relevant to plan of care:  Right knee replacement, left knee manipulation 10/31/17    Clinical Presentation  Stable    Clinical Decision Making  Low    Rehab Potential  Excellent    PT Frequency  3x / week    PT Duration  6 weeks    PT Treatment/Interventions  ADLs/Self Care Home Management;Cryotherapy;Electrical Stimulation;Gait training;Stair training;Functional mobility training;Therapeutic activities;Therapeutic exercise;Neuromuscular re-education;Patient/family education;Passive range of motion;Manual techniques;Vasopneumatic Device    PT Next Visit Plan  VMS to quadricep muscle for quad activation, therapeutic exercises to improve range of motion.    PT Home Exercise Plan  Quad sets, heel slides in supine and sitting.    Consulted and Agree with Plan of Care  Patient       Patient will benefit from skilled therapeutic intervention in order to improve the following deficits and impairments:  Abnormal gait, Decreased activity tolerance, Decreased mobility, Decreased range of motion, Increased edema, Decreased strength, Pain  Visit Diagnosis: Chronic pain of left knee - Plan: PT plan of care cert/re-cert  Stiffness of left knee, not elsewhere classified - Plan: PT plan of care cert/re-cert     Problem List Patient Active Problem List   Diagnosis Date Noted  . S/P TKR (total knee replacement), left 08/24/2017  . Status post total knee replacement, right 03/02/2017  . Chronic intractable headache 08/09/2016  . Other specified hypothyroidism 03/02/2016  . A-fib ([place]) 12/24/2015  . Non-ischemic cardiomyopathy ([place]) 11/07/2015  . Long term (current) use of anticoagulants 11/07/2015  . Diaphoresis 11/01/2015   . Chest pain 11/01/2015  . PAF (paroxysmal atrial fibrillation) ([place]) 07/15/2015  . Heart palpitations 07/11/2015  . Dyspnea 02/18/2015  . Anemia, iron deficiency 09/10/2013  . Diaphragmatic hernia without mention of obstruction or gangrene   . Hypothyroidism   . Malaise and fatigue   . Arteritis, unspecified ([place])   . Iron deficiency anemia, unspecified   . Obesity   . BPH (benign prostatic hyperplasia)   . Essential hypertension, benign   . Hyperlipidemia     Gabriela Eves, PT, DPT 11/01/2017, 10:39 AM  [facility] [address] [place], [place], [address] Phone: [phone]   Fax:  [phone]  Name: Troy Acosta MRN: [id number] Date of Birth: [birth year]

## 2017-11-02 ENCOUNTER — Encounter: Payer: Self-pay | Admitting: Physical Therapy

## 2017-11-02 ENCOUNTER — Ambulatory Visit: Payer: BLUE CROSS/BLUE SHIELD | Admitting: Physical Therapy

## 2017-11-02 DIAGNOSIS — M25562 Pain in left knee: Principal | ICD-10-CM

## 2017-11-02 DIAGNOSIS — R600 Localized edema: Secondary | ICD-10-CM | POA: Diagnosis not present

## 2017-11-02 DIAGNOSIS — G8929 Other chronic pain: Secondary | ICD-10-CM

## 2017-11-02 DIAGNOSIS — M25662 Stiffness of left knee, not elsewhere classified: Secondary | ICD-10-CM

## 2017-11-02 NOTE — Therapy (Signed)
[facility] [place], [place], [address] Phone: [phone]   Fax:  [phone]  Physical Therapy Treatment  Patient Details  Name: Troy Acosta MRN: [id number] Date of Birth: May 25, 1951 Referring Provider: Esmond Plants MD   Encounter Date: 11/02/2017  PT End of Session - 11/02/17 1155    Visit Number  19    Number of Visits  30    Date for PT Re-Evaluation  12/12/17    PT Start Time  1030    PT Stop Time  1139    PT Time Calculation (min)  69 min       Past Medical History:  Diagnosis Date  . Anxiety   . Arthritis    "all my joints" (08/24/2017)  . Diaphragmatic hernia without mention of obstruction or gangrene   . Dysrhythmia    afib, ablation- 12/2015, no problems since, off blood thinner- 09/2016  . Essential hypertension, benign   . Headache    had MRI- was consulted with Dr. Jannifer Franklin,, headache has resolved"probably related to caffeine"  . Hyperplasia of prostate   . Iron deficiency anemia, unspecified   . Malaise and fatigue   . Obesity   . Other and unspecified hyperlipidemia   . Paroxysmal atrial fibrillation (HCC)   . PONV (postoperative nausea and vomiting)   . Unspecified hypothyroidism   . Wears dentures     Past Surgical History:  Procedure Laterality Date  . COLONOSCOPY WITH ESOPHAGOGASTRODUODENOSCOPY (EGD)    . ELECTROPHYSIOLOGIC STUDY N/A 12/24/2015   Afib ablation by Dr Rayann Heman  . JOINT REPLACEMENT    . KNEE ARTHROSCOPY Right ~ 2016  . LUNG BIOPSY  1990s   "no problems identified"  . MULTIPLE TOOTH EXTRACTIONS    . TOTAL KNEE ARTHROPLASTY Right 03/02/2017   Procedure: RIGHT TOTAL KNEE ARTHROPLASTY;  Surgeon: [name], MD;  Location: [place];  Service: Orthopedics;  Laterality: Right;  . TOTAL KNEE ARTHROPLASTY Left 08/24/2017  . TOTAL KNEE ARTHROPLASTY Left 08/24/2017   Procedure: LEFT TOTAL KNEE ARTHROPLASTY;  Surgeon: [name], MD;  Location: [place];  Service: Orthopedics;   Laterality: Left;    There were no vitals filed for this visit.  Subjective Assessment - 11/02/17 1204    Subjective  I'm doing better today but stillneed the crutches.    Pain Score  4     Pain Orientation  Left    Pain Descriptors / Indicators  Discomfort    Pain Onset  Yesterday                      [facility] Adult PT Treatment/Exercise - 11/02/17 0001      Knee/Hip Exercises: Aerobic   Stationary Bike  Level 1 x 15 minutes.      Knee/Hip Exercises: Supine   Quad Sets Limitations  Quad sets performed for 20 minutes with 10 sec holds and 10 sec rest x 20 minutes facilitated with VMS (4 electrodes on co-contract).      Vasopneumatic   Number Minutes Vasopneumatic   20 minutes    Vasopnuematic Location   -- Left knee.    Vasopneumatic Pressure  Medium      Manual Therapy   Manual Therapy  Passive ROM    Passive ROM  PROM of left knee x 5 minutes in supine.             PT Education - 11/01/17 0847    Education provided  Yes    Education Details  Heel slides, knee extension, knee flexion    Person(s) Educated  Patient    Methods  Explanation;Demonstration;Handout    Comprehension  Verbalized understanding;Returned demonstration       PT Short Term Goals - 09/12/17 1243      PT SHORT TERM GOAL #1   Title  STG's=LTG's.        PT Long Term Goals - 10/22/17 0813      PT LONG TERM GOAL #1   Title  Independent with a HEP.    Time  6    Period  Weeks    Status  On-going      PT LONG TERM GOAL #2   Title  Full active left knee extension in order to normalize gait.    Time  6    Period  Weeks    Status  On-going AROM L knee extension -7 deg from neutral 10/22/2017      PT LONG TERM GOAL #3   Title  Active left knee flexion to 115 degrees+ so the patient can perform functional tasks and do so with pain not > 2-3/10.    Time  6    Period  Weeks    Status  On-going AROM L knee flexion 105 deg 10/22/2017      PT LONG TERM GOAL #4   Title  Increase  left knee strength to a solid 5/5 to provide good stability for accomplishment of functional activities.    Time  6    Period  Weeks    Status  On-going      PT LONG TERM GOAL #5   Title  Perform a reciprocating stair gait with one railing.    Time  6    Period  Weeks    Status  On-going            Plan - 11/02/17 1210    Clinical Impression Statement  The patient was able to perform a SLR without lag today.    PT Treatment/Interventions  ADLs/Self Care Home Management;Cryotherapy;Electrical Stimulation;Gait training;Stair training;Functional mobility training;Therapeutic activities;Therapeutic exercise;Neuromuscular re-education;Patient/family education;Passive range of motion;Manual techniques;Vasopneumatic Device       Patient will benefit from skilled therapeutic intervention in order to improve the following deficits and impairments:  Abnormal gait, Decreased activity tolerance, Decreased mobility, Decreased range of motion, Increased edema, Decreased strength, Pain  Visit Diagnosis: Chronic pain of left knee  Stiffness of left knee, not elsewhere classified  Localized edema     Problem List Patient Active Problem List   Diagnosis Date Noted  . S/P TKR (total knee replacement), left 08/24/2017  . Status post total knee replacement, right 03/02/2017  . Chronic intractable headache 08/09/2016  . Other specified hypothyroidism 03/02/2016  . A-fib ([place]) 12/24/2015  . Non-ischemic cardiomyopathy ([place]) 11/07/2015  . Long term (current) use of anticoagulants 11/07/2015  . Diaphoresis 11/01/2015  . Chest pain 11/01/2015  . PAF (paroxysmal atrial fibrillation) ([place]) 07/15/2015  . Heart palpitations 07/11/2015  . Dyspnea 02/18/2015  . Anemia, iron deficiency 09/10/2013  . Diaphragmatic hernia without mention of obstruction or gangrene   . Hypothyroidism   . Malaise and fatigue   . Arteritis, unspecified ([place])   . Iron deficiency anemia, unspecified   . Obesity   .  BPH (benign prostatic hyperplasia)   . Essential hypertension, benign   . Hyperlipidemia     Shanera Meske, [place] MPT 11/02/2017, 12:11 PM  [facility] [address] [place], [place], [address] Phone: [phone]  Fax:  [phone]  Name: Troy Acosta MRN: [id number] Date of Birth: 05/29/1951

## 2017-11-05 ENCOUNTER — Ambulatory Visit: Payer: BLUE CROSS/BLUE SHIELD | Admitting: Physical Therapy

## 2017-11-05 DIAGNOSIS — G8929 Other chronic pain: Secondary | ICD-10-CM

## 2017-11-05 DIAGNOSIS — M25662 Stiffness of left knee, not elsewhere classified: Secondary | ICD-10-CM | POA: Diagnosis not present

## 2017-11-05 DIAGNOSIS — M25562 Pain in left knee: Principal | ICD-10-CM

## 2017-11-05 DIAGNOSIS — R600 Localized edema: Secondary | ICD-10-CM | POA: Diagnosis not present

## 2017-11-05 NOTE — Therapy (Signed)
[facility] [place], [place], [address] Phone: [phone]   Fax:  [phone]  Physical Therapy Treatment  Patient Details  Name: Troy Acosta MRN: [id number] Date of Birth: 1951-02-05 Referring Provider: Esmond Plants MD   Encounter Date: 11/05/2017  PT End of Session - 11/05/17 1300    Visit Number  20    Number of Visits  30    Date for PT Re-Evaluation  12/12/17    PT Start Time  1030    PT Stop Time  1120    PT Time Calculation (min)  50 min    Activity Tolerance  Patient tolerated treatment well    Behavior During Therapy  [facility] for tasks assessed/performed       Past Medical History:  Diagnosis Date  . Anxiety   . Arthritis    "all my joints" (08/24/2017)  . Diaphragmatic hernia without mention of obstruction or gangrene   . Dysrhythmia    afib, ablation- 12/2015, no problems since, off blood thinner- 09/2016  . Essential hypertension, benign   . Headache    had MRI- was consulted with Dr. Jannifer Franklin,, headache has resolved"probably related to caffeine"  . Hyperplasia of prostate   . Iron deficiency anemia, unspecified   . Malaise and fatigue   . Obesity   . Other and unspecified hyperlipidemia   . Paroxysmal atrial fibrillation (HCC)   . PONV (postoperative nausea and vomiting)   . Unspecified hypothyroidism   . Wears dentures     Past Surgical History:  Procedure Laterality Date  . COLONOSCOPY WITH ESOPHAGOGASTRODUODENOSCOPY (EGD)    . ELECTROPHYSIOLOGIC STUDY N/A 12/24/2015   Afib ablation by Dr Rayann Heman  . JOINT REPLACEMENT    . KNEE ARTHROSCOPY Right ~ 2016  . LUNG BIOPSY  1990s   "no problems identified"  . MULTIPLE TOOTH EXTRACTIONS    . TOTAL KNEE ARTHROPLASTY Right 03/02/2017   Procedure: RIGHT TOTAL KNEE ARTHROPLASTY;  Surgeon: [name], MD;  Location: [place];  Service: Orthopedics;  Laterality: Right;  . TOTAL KNEE ARTHROPLASTY Left 08/24/2017  . TOTAL KNEE ARTHROPLASTY Left 08/24/2017    Procedure: LEFT TOTAL KNEE ARTHROPLASTY;  Surgeon: [name], MD;  Location: [place];  Service: Orthopedics;  Laterality: Left;    There were no vitals filed for this visit.  Subjective Assessment - 11/05/17 1305    Subjective  pt arriving to therapy reporting doing much better and able to lift his Left leg.     Pertinent History  Right total knee replacement/ Left manipulaton    Limitations  Walking;Standing    Diagnostic tests  X-ray     Patient Stated Goals  2hrs    Currently in Pain?  Yes    Pain Score  3     Pain Orientation  Left    Pain Descriptors / Indicators  Aching;Sore    Pain Type  Surgical pain    Pain Onset  1 to 4 weeks ago    Pain Frequency  Constant         [facility] PT Assessment - 11/05/17 0001      Assessment   Medical Diagnosis  left total knee replacement    Onset Date/Surgical Date  08/24/17    Hand Dominance  Right    Next MD Visit  10/31/17      Restrictions   Weight Bearing Restrictions  No      AROM   Overall AROM   Deficits    AROM Assessment Site  Knee    Right/Left Knee  Left    Left Knee Extension  8    Left Knee Flexion  105      PROM   Left Knee Extension  5    Left Knee Flexion  110      Strength   Overall Strength Comments  Pt able to perform a SLR with no lag                  [facility] Adult PT Treatment/Exercise - 11/05/17 0001      Knee/Hip Exercises: Aerobic   Stationary Bike  Level 4 x 15 minutes.      Knee/Hip Exercises: Machines for Strengthening   Cybex Leg Press  2 plates x 20 reps, 2 sets      Knee/Hip Exercises: Standing   Forward Lunges  Left;2 sets;10 reps    Forward Step Up  Both;15 reps;Hand Hold: 2;Step Height: 8"    Step Down  Right;20 reps;Hand Hold: 2;Step Height: 8"    Wall Squat  10 reps    Rocker Board  2 minutes      Knee/Hip Exercises: Prone   Hamstring Curl  5 reps      Electrical Stimulation   Electrical Stimulation Location  L knee    Electrical Stimulation Action  Pre-mod     Electrical Stimulation Parameters  80-150 Hz x 15 minutes    Electrical Stimulation Goals  Edema;Pain      Vasopneumatic   Number Minutes Vasopneumatic   15 minutes    Vasopneumatic Pressure  Medium    Vasopneumatic Temperature   34      Manual Therapy   Manual Therapy  Passive ROM    Passive ROM  Prone extension with overpressure with distanal hamstring tendon STW x 10 minutes             PT Education - 11/05/17 1306    Education provided  Yes    Education Details  Prone knee hangs with weights    Person(s) Educated  Patient    Methods  Explanation    Comprehension  Verbalized understanding;Returned demonstration       PT Short Term Goals - 09/12/17 1243      PT SHORT TERM GOAL #1   Title  STG's=LTG's.        PT Long Term Goals - 11/05/17 1303      PT LONG TERM GOAL #1   Title  Independent with a HEP.    Period  Weeks    Status  On-going      PT LONG TERM GOAL #2   Title  Full active left knee extension in order to normalize gait.    Status  On-going      PT LONG TERM GOAL #3   Title  Active left knee flexion to 115 degrees+ so the patient can perform functional tasks and do so with pain not > 2-3/10.    Status  On-going      PT LONG TERM GOAL #4   Title  Increase left knee strength to a solid 5/5 to provide good stability for accomplishment of functional activities.    Status  On-going      PT LONG TERM GOAL #5   Title  Perform a reciprocating stair gait with one railing.    Status  On-going      PT LONG TERM GOAL #6   Title  Pt will be albe to improve R knee pain </= 3/10 with  amb and ADL's.     Status  On-going            Plan - 11/05/17 1300    Clinical Impression Statement  Pt tolerating exercises well today and is able to deomonstrate a SLR after his nerve block wore off. Pt still amb with antalgic gait pattern with decreased stance time on left LE. Pt tolreating all exericses well and normal response to modalities and vaso. Pt with left  knee flexion of 110 passively and 104 actively.  Continue with skilled PT.     PT Frequency  3x / week    PT Duration  6 weeks    PT Next Visit Plan  therapeutic exercises to improve range of motion.    PT Home Exercise Plan  Quad sets, heel slides in supine and sitting.    Consulted and Agree with Plan of Care  Patient       Patient will benefit from skilled therapeutic intervention in order to improve the following deficits and impairments:  Abnormal gait, Decreased activity tolerance, Decreased mobility, Decreased range of motion, Increased edema, Decreased strength, Pain  Visit Diagnosis: Chronic pain of left knee  Stiffness of left knee, not elsewhere classified  Localized edema     Problem List Patient Active Problem List   Diagnosis Date Noted  . S/P TKR (total knee replacement), left 08/24/2017  . Status post total knee replacement, right 03/02/2017  . Chronic intractable headache 08/09/2016  . Other specified hypothyroidism 03/02/2016  . A-fib ([place]) 12/24/2015  . Non-ischemic cardiomyopathy ([place]) 11/07/2015  . Long term (current) use of anticoagulants 11/07/2015  . Diaphoresis 11/01/2015  . Chest pain 11/01/2015  . PAF (paroxysmal atrial fibrillation) ([place]) 07/15/2015  . Heart palpitations 07/11/2015  . Dyspnea 02/18/2015  . Anemia, iron deficiency 09/10/2013  . Diaphragmatic hernia without mention of obstruction or gangrene   . Hypothyroidism   . Malaise and fatigue   . Arteritis, unspecified ([place])   . Iron deficiency anemia, unspecified   . Obesity   . BPH (benign prostatic hyperplasia)   . Essential hypertension, benign   . Hyperlipidemia     [name], PT 11/05/2017, 1:09 PM  [facility] [place], [place], [address] Phone: [phone]   Fax:  [phone]  Name: Troy Acosta MRN: [id number] Date of Birth: 1951-03-08

## 2017-11-07 ENCOUNTER — Ambulatory Visit: Payer: BLUE CROSS/BLUE SHIELD | Admitting: Physical Therapy

## 2017-11-07 ENCOUNTER — Encounter: Payer: Self-pay | Admitting: Physical Therapy

## 2017-11-07 DIAGNOSIS — M25662 Stiffness of left knee, not elsewhere classified: Secondary | ICD-10-CM | POA: Diagnosis not present

## 2017-11-07 DIAGNOSIS — G8929 Other chronic pain: Secondary | ICD-10-CM | POA: Diagnosis not present

## 2017-11-07 DIAGNOSIS — R600 Localized edema: Secondary | ICD-10-CM

## 2017-11-07 DIAGNOSIS — M25562 Pain in left knee: Secondary | ICD-10-CM | POA: Diagnosis not present

## 2017-11-07 NOTE — Therapy (Signed)
[facility] [place], [place], [address] Phone: [phone]   Fax:  [phone]  Physical Therapy Treatment  Patient Details  Name: Troy Acosta MRN: [id number] Date of Birth: 04-04-1951 Referring Provider: Esmond Plants MD   Encounter Date: 11/07/2017  PT End of Session - 11/07/17 1113    Visit Number  21    Number of Visits  30    Date for PT Re-Evaluation  12/12/17    PT Start Time  1030    PT Stop Time  1129    PT Time Calculation (min)  59 min    Activity Tolerance  Patient tolerated treatment well    Behavior During Therapy  [facility] for tasks assessed/performed       Past Medical History:  Diagnosis Date  . Anxiety   . Arthritis    "all my joints" (08/24/2017)  . Diaphragmatic hernia without mention of obstruction or gangrene   . Dysrhythmia    afib, ablation- 12/2015, no problems since, off blood thinner- 09/2016  . Essential hypertension, benign   . Headache    had MRI- was consulted with Dr. Jannifer Franklin,, headache has resolved"probably related to caffeine"  . Hyperplasia of prostate   . Iron deficiency anemia, unspecified   . Malaise and fatigue   . Obesity   . Other and unspecified hyperlipidemia   . Paroxysmal atrial fibrillation (HCC)   . PONV (postoperative nausea and vomiting)   . Unspecified hypothyroidism   . Wears dentures     Past Surgical History:  Procedure Laterality Date  . COLONOSCOPY WITH ESOPHAGOGASTRODUODENOSCOPY (EGD)    . ELECTROPHYSIOLOGIC STUDY N/A 12/24/2015   Afib ablation by Dr Rayann Heman  . JOINT REPLACEMENT    . KNEE ARTHROSCOPY Right ~ 2016  . LUNG BIOPSY  1990s   "no problems identified"  . MULTIPLE TOOTH EXTRACTIONS    . TOTAL KNEE ARTHROPLASTY Right 03/02/2017   Procedure: RIGHT TOTAL KNEE ARTHROPLASTY;  Surgeon: [name], MD;  Location: [place];  Service: Orthopedics;  Laterality: Right;  . TOTAL KNEE ARTHROPLASTY Left 08/24/2017  . TOTAL KNEE ARTHROPLASTY Left 08/24/2017   Procedure: LEFT TOTAL KNEE ARTHROPLASTY;  Surgeon: [name], MD;  Location: [place];  Service: Orthopedics;  Laterality: Left;    There were no vitals filed for this visit.  Subjective Assessment - 11/07/17 1041    Subjective  Patient reported walking a half mile yesterday which caused increased discomfort today    Pertinent History  Right total knee replacement/ Left manipulaton    Limitations  Walking;Standing    How long can you sit comfortably?  2 hours    How long can you stand comfortably?  2 hours    How long can you walk comfortably?  Short community distances.  He is not driving yet.    Diagnostic tests  X-ray     Patient Stated Goals  2hrs    Pain Score  4     Pain Location  Knee    Pain Orientation  Left    Pain Descriptors / Indicators  Discomfort;Aching;Sore    Pain Type  Surgical pain    Pain Onset  More than a month ago    Pain Frequency  Intermittent    Aggravating Factors   prolong activity         [facility] PT Assessment - 11/07/17 0001      AROM   AROM Assessment Site  Knee    Right/Left Knee  Left    Left  Knee Extension  -10    Left Knee Flexion  98      PROM   PROM Assessment Site  Knee    Right/Left Knee  Left    Left Knee Extension  -6    Left Knee Flexion  111                  [facility] Adult PT Treatment/Exercise - 11/07/17 0001      Knee/Hip Exercises: Aerobic   Stationary Bike  Level 4 x 15 minutes.      Knee/Hip Exercises: Standing   Lateral Step Up  20 reps;Left;Hand Hold: 1;Step Height: 8"    Forward Step Up  3 sets;10 reps;Left;Hand Hold: 1;Step Height: 8"    Step Down  Left;Hand Hold: 1;15 reps;Step Height: 8"    Rocker Board  2 minutes      Acupuncturist Location  L knee    Electrical Stimulation Action  IFC    Electrical Stimulation Parameters  1-10hz  x35min    Electrical Stimulation Goals  Edema;Pain      Vasopneumatic   Number Minutes Vasopneumatic   15 minutes    Vasopnuematic  Location   Knee    Vasopneumatic Pressure  Medium      Manual Therapy   Manual Therapy  Passive ROM    Passive ROM  manual PROM for left knee flexion with low load holds then ext with overpressure               PT Short Term Goals - 09/12/17 1243      PT SHORT TERM GOAL #1   Title  STG's=LTG's.        PT Long Term Goals - 11/05/17 1303      PT LONG TERM GOAL #1   Title  Independent with a HEP.    Period  Weeks    Status  On-going      PT LONG TERM GOAL #2   Title  Full active left knee extension in order to normalize gait.    Status  On-going      PT LONG TERM GOAL #3   Title  Active left knee flexion to 115 degrees+ so the patient can perform functional tasks and do so with pain not > 2-3/10.    Status  On-going      PT LONG TERM GOAL #4   Title  Increase left knee strength to a solid 5/5 to provide good stability for accomplishment of functional activities.    Status  On-going      PT LONG TERM GOAL #5   Title  Perform a reciprocating stair gait with one railing.    Status  On-going      PT LONG TERM GOAL #6   Title  Pt will be albe to improve R knee pain </= 3/10 with amb and ADL's.     Status  On-going            Plan - 11/07/17 1116    Clinical Impression Statement  Patient tolerated treatment well today. Patient has ongoing tightness in left knee. Focused on left knee stretching and strengthening today. Patient doing self stretches daily. Goals ongoing today.     Rehab Potential  Excellent    PT Frequency  3x / week    PT Duration  6 weeks    PT Treatment/Interventions  ADLs/Self Care Home Management;Cryotherapy;Electrical Stimulation;Gait training;Stair training;Functional mobility training;Therapeutic activities;Therapeutic exercise;Neuromuscular re-education;Patient/family education;Passive range of motion;Manual techniques;Vasopneumatic  Device    PT Next Visit Plan  cont with POC for therapeutic exercises to improve range of motion.     Consulted and Agree with Plan of Care  Patient       Patient will benefit from skilled therapeutic intervention in order to improve the following deficits and impairments:  Abnormal gait, Decreased activity tolerance, Decreased mobility, Decreased range of motion, Increased edema, Decreased strength, Pain  Visit Diagnosis: Chronic pain of left knee  Stiffness of left knee, not elsewhere classified  Localized edema     Problem List Patient Active Problem List   Diagnosis Date Noted  . S/P TKR (total knee replacement), left 08/24/2017  . Status post total knee replacement, right 03/02/2017  . Chronic intractable headache 08/09/2016  . Other specified hypothyroidism 03/02/2016  . A-fib ([place]) 12/24/2015  . Non-ischemic cardiomyopathy ([place]) 11/07/2015  . Long term (current) use of anticoagulants 11/07/2015  . Diaphoresis 11/01/2015  . Chest pain 11/01/2015  . PAF (paroxysmal atrial fibrillation) ([place]) 07/15/2015  . Heart palpitations 07/11/2015  . Dyspnea 02/18/2015  . Anemia, iron deficiency 09/10/2013  . Diaphragmatic hernia without mention of obstruction or gangrene   . Hypothyroidism   . Malaise and fatigue   . Arteritis, unspecified ([place])   . Iron deficiency anemia, unspecified   . Obesity   . BPH (benign prostatic hyperplasia)   . Essential hypertension, benign   . Hyperlipidemia     DUNFORD, CHRISTINA P, PTA 11/07/2017, 11:19 AM  [facility] [place], [place], [address] Phone: [phone]   Fax:  [phone]  Name: Mahad Newstrom MRN: [id number] Date of Birth: 05-22-1951

## 2017-11-08 ENCOUNTER — Encounter: Payer: BLUE CROSS/BLUE SHIELD | Admitting: Physical Therapy

## 2017-11-09 ENCOUNTER — Ambulatory Visit: Payer: BLUE CROSS/BLUE SHIELD | Attending: Orthopedic Surgery | Admitting: Physical Therapy

## 2017-11-09 ENCOUNTER — Encounter: Payer: Self-pay | Admitting: Physical Therapy

## 2017-11-09 DIAGNOSIS — R600 Localized edema: Secondary | ICD-10-CM | POA: Insufficient documentation

## 2017-11-09 DIAGNOSIS — M25662 Stiffness of left knee, not elsewhere classified: Secondary | ICD-10-CM | POA: Insufficient documentation

## 2017-11-09 DIAGNOSIS — G8929 Other chronic pain: Secondary | ICD-10-CM | POA: Insufficient documentation

## 2017-11-09 DIAGNOSIS — M25562 Pain in left knee: Secondary | ICD-10-CM | POA: Insufficient documentation

## 2017-11-09 NOTE — Therapy (Signed)
[facility] [place], [place], [address] Phone: [phone]   Fax:  [phone]  Physical Therapy Treatment  Patient Details  Name: Troy Acosta MRN: [id number] Date of Birth: [birth year] Referring Provider: Esmond Plants MD   Encounter Date: 11/09/2017  PT End of Session - 11/09/17 0913    Visit Number  22    Number of Visits  30    Date for PT Re-Evaluation  12/12/17    PT Start Time  0901    PT Stop Time  0957    PT Time Calculation (min)  56 min    Activity Tolerance  Patient tolerated treatment well    Behavior During Therapy  [facility] for tasks assessed/performed       Past Medical History:  Diagnosis Date  . Anxiety   . Arthritis    "all my joints" (08/24/2017)  . Diaphragmatic hernia without mention of obstruction or gangrene   . Dysrhythmia    afib, ablation- 12/2015, no problems since, off blood thinner- 09/2016  . Essential hypertension, benign   . Headache    had MRI- was consulted with Dr. Jannifer Franklin,, headache has resolved"probably related to caffeine"  . Hyperplasia of prostate   . Iron deficiency anemia, unspecified   . Malaise and fatigue   . Obesity   . Other and unspecified hyperlipidemia   . Paroxysmal atrial fibrillation (HCC)   . PONV (postoperative nausea and vomiting)   . Unspecified hypothyroidism   . Wears dentures     Past Surgical History:  Procedure Laterality Date  . COLONOSCOPY WITH ESOPHAGOGASTRODUODENOSCOPY (EGD)    . ELECTROPHYSIOLOGIC STUDY N/A 12/24/2015   Afib ablation by Dr Rayann Heman  . JOINT REPLACEMENT    . KNEE ARTHROSCOPY Right ~ 2016  . LUNG BIOPSY  1990s   "no problems identified"  . MULTIPLE TOOTH EXTRACTIONS    . TOTAL KNEE ARTHROPLASTY Right 03/02/2017   Procedure: RIGHT TOTAL KNEE ARTHROPLASTY;  Surgeon: [name], MD;  Location: [place];  Service: Orthopedics;  Laterality: Right;  . TOTAL KNEE ARTHROPLASTY Left 08/24/2017  . TOTAL KNEE ARTHROPLASTY Left 08/24/2017    Procedure: LEFT TOTAL KNEE ARTHROPLASTY;  Surgeon: [name], MD;  Location: [place];  Service: Orthopedics;  Laterality: Left;    There were no vitals filed for this visit.  Subjective Assessment - 11/09/17 0905    Subjective  Reports that he still hurts and has been walking half a mile daily and by the time he is done he has throbbing pain. Still reports difficulty sleeping.    Pertinent History  Right total knee replacement/ Left manipulaton    Limitations  Walking;Standing    How long can you sit comfortably?  2 hours    How long can you stand comfortably?  2 hours    How long can you walk comfortably?  Short community distances.  He is not driving yet.    Diagnostic tests  X-ray     Patient Stated Goals  2hrs    Currently in Pain?  Yes    Pain Score  5     Pain Location  Knee    Pain Orientation  Left    Pain Descriptors / Indicators  Sore    Pain Type  Surgical pain    Pain Onset  More than a month ago         [facility] PT Assessment - 11/09/17 0001      Assessment   Medical Diagnosis  left total knee replacement  Onset Date/Surgical Date  08/24/17    Hand Dominance  Right    Next MD Visit  11/14/2017      Restrictions   Weight Bearing Restrictions  No                  [facility] Adult PT Treatment/Exercise - 11/09/17 0001      Knee/Hip Exercises: Aerobic   Stationary Bike  Level 4 x 15 minutes.      Knee/Hip Exercises: Machines for Strengthening   Cybex Knee Extension  20# 3x10 reps    Cybex Knee Flexion  40# 3x10 reps    Cybex Leg Press  --      Knee/Hip Exercises: Standing   Lateral Step Up  Left;3 sets;10 reps;Hand Hold: 2;Step Height: 6"    Forward Step Up  3 sets;10 reps;Left;Hand Hold: 1;Step Height: 8"    Step Down  Left;2 sets;10 reps;Hand Hold: 2;Step Height: 4"      Modalities   Modalities  Psychologist, educational Location  L knee    Electrical Stimulation Action  IFC     Electrical Stimulation Parameters  1-10 hz x15 min    Electrical Stimulation Goals  Edema;Pain      Vasopneumatic   Number Minutes Vasopneumatic   15 minutes    Vasopnuematic Location   Knee    Vasopneumatic Pressure  Medium    Vasopneumatic Temperature   34               PT Short Term Goals - 09/12/17 1243      PT SHORT TERM GOAL #1   Title  STG's=LTG's.        PT Long Term Goals - 11/05/17 1303      PT LONG TERM GOAL #1   Title  Independent with a HEP.    Period  Weeks    Status  On-going      PT LONG TERM GOAL #2   Title  Full active left knee extension in order to normalize gait.    Status  On-going      PT LONG TERM GOAL #3   Title  Active left knee flexion to 115 degrees+ so the patient can perform functional tasks and do so with pain not > 2-3/10.    Status  On-going      PT LONG TERM GOAL #4   Title  Increase left knee strength to a solid 5/5 to provide good stability for accomplishment of functional activities.    Status  On-going      PT LONG TERM GOAL #5   Title  Perform a reciprocating stair gait with one railing.    Status  On-going      PT LONG TERM GOAL #6   Title  Pt will be albe to improve R knee pain </= 3/10 with amb and ADL's.     Status  On-going            Plan - 11/09/17 0947    Clinical Impression Statement  Patient tolerated today's treatment well today with no complaints during any exercises. Patient still complaining of L knee pain and burning throughout the day but also during ambulation. No extensor lag observed today in treatment. Normal modalities response noted following removal of the modalities.    Rehab Potential  Excellent    PT Frequency  3x / week    PT Duration  6 weeks    PT  Treatment/Interventions  ADLs/Self Care Home Management;Cryotherapy;Electrical Stimulation;Gait training;Stair training;Functional mobility training;Therapeutic activities;Therapeutic exercise;Neuromuscular re-education;Patient/family  education;Passive range of motion;Manual techniques;Vasopneumatic Device    PT Next Visit Plan  cont with POC for therapeutic exercises to improve range of motion.    PT Home Exercise Plan  Quad sets, heel slides in supine and sitting.    Consulted and Agree with Plan of Care  Patient       Patient will benefit from skilled therapeutic intervention in order to improve the following deficits and impairments:  Abnormal gait, Decreased activity tolerance, Decreased mobility, Decreased range of motion, Increased edema, Decreased strength, Pain  Visit Diagnosis: Chronic pain of left knee  Stiffness of left knee, not elsewhere classified  Localized edema     Problem List Patient Active Problem List   Diagnosis Date Noted  . S/P TKR (total knee replacement), left 08/24/2017  . Status post total knee replacement, right 03/02/2017  . Chronic intractable headache 08/09/2016  . Other specified hypothyroidism 03/02/2016  . A-fib ([place]) 12/24/2015  . Non-ischemic cardiomyopathy ([place]) 11/07/2015  . Long term (current) use of anticoagulants 11/07/2015  . Diaphoresis 11/01/2015  . Chest pain 11/01/2015  . PAF (paroxysmal atrial fibrillation) ([place]) 07/15/2015  . Heart palpitations 07/11/2015  . Dyspnea 02/18/2015  . Anemia, iron deficiency 09/10/2013  . Diaphragmatic hernia without mention of obstruction or gangrene   . Hypothyroidism   . Malaise and fatigue   . Arteritis, unspecified ([place])   . Iron deficiency anemia, unspecified   . Obesity   . BPH (benign prostatic hyperplasia)   . Essential hypertension, benign   . Hyperlipidemia     Standley Brooking, PTA 11/09/2017, 10:03 AM  [facility] [address] [place], [place], [address] Phone: [phone]   Fax:  [phone]  Name: Troy Acosta MRN: [id number] Date of Birth: [birth year]

## 2017-11-12 ENCOUNTER — Encounter: Payer: Self-pay | Admitting: Physical Therapy

## 2017-11-12 ENCOUNTER — Ambulatory Visit: Payer: BLUE CROSS/BLUE SHIELD | Admitting: Physical Therapy

## 2017-11-12 DIAGNOSIS — M25562 Pain in left knee: Secondary | ICD-10-CM | POA: Diagnosis not present

## 2017-11-12 DIAGNOSIS — R600 Localized edema: Secondary | ICD-10-CM

## 2017-11-12 DIAGNOSIS — G8929 Other chronic pain: Secondary | ICD-10-CM | POA: Diagnosis not present

## 2017-11-12 DIAGNOSIS — M25662 Stiffness of left knee, not elsewhere classified: Secondary | ICD-10-CM

## 2017-11-12 NOTE — Therapy (Signed)
[facility] [place], [place], [address] Phone: [phone]   Fax:  [phone]  Physical Therapy Treatment  Patient Details  Name: Troy Acosta MRN: [id number] Date of Birth: [birth year] Referring Provider: Esmond Plants MD   Encounter Date: 11/12/2017  PT End of Session - 11/12/17 0741    Visit Number  23    Number of Visits  30    Date for PT Re-Evaluation  12/12/17    PT Start Time  0730    PT Stop Time  0821    PT Time Calculation (min)  51 min    Activity Tolerance  Patient tolerated treatment well    Behavior During Therapy  [facility] for tasks assessed/performed       Past Medical History:  Diagnosis Date  . Anxiety   . Arthritis    "all my joints" (08/24/2017)  . Diaphragmatic hernia without mention of obstruction or gangrene   . Dysrhythmia    afib, ablation- 12/2015, no problems since, off blood thinner- 09/2016  . Essential hypertension, benign   . Headache    had MRI- was consulted with Dr. Jannifer Franklin,, headache has resolved"probably related to caffeine"  . Hyperplasia of prostate   . Iron deficiency anemia, unspecified   . Malaise and fatigue   . Obesity   . Other and unspecified hyperlipidemia   . Paroxysmal atrial fibrillation (HCC)   . PONV (postoperative nausea and vomiting)   . Unspecified hypothyroidism   . Wears dentures     Past Surgical History:  Procedure Laterality Date  . COLONOSCOPY WITH ESOPHAGOGASTRODUODENOSCOPY (EGD)    . ELECTROPHYSIOLOGIC STUDY N/A 12/24/2015   Afib ablation by Dr Rayann Heman  . JOINT REPLACEMENT    . KNEE ARTHROSCOPY Right ~ 2016  . LUNG BIOPSY  1990s   "no problems identified"  . MULTIPLE TOOTH EXTRACTIONS    . TOTAL KNEE ARTHROPLASTY Right 03/02/2017   Procedure: RIGHT TOTAL KNEE ARTHROPLASTY;  Surgeon: [name], MD;  Location: [place];  Service: Orthopedics;  Laterality: Right;  . TOTAL KNEE ARTHROPLASTY Left 08/24/2017  . TOTAL KNEE ARTHROPLASTY Left 08/24/2017   Procedure: LEFT TOTAL KNEE ARTHROPLASTY;  Surgeon: [name], MD;  Location: [place];  Service: Orthopedics;  Laterality: Left;    There were no vitals filed for this visit.  Subjective Assessment - 11/12/17 0741    Subjective  Still having trouble sleeping. Reports after leaving PT the stiffness will return but is working on prone hangs 3 times a day and on stationary bike.    Pertinent History  Right total knee replacement/ Left manipulaton    Limitations  Walking;Standing    How long can you sit comfortably?  2 hours    How long can you stand comfortably?  2 hours    How long can you walk comfortably?  Short community distances.  He is not driving yet.    Diagnostic tests  X-ray     Patient Stated Goals  2hrs    Currently in Pain?  Yes    Pain Score  5     Pain Location  Knee    Pain Orientation  Left    Pain Descriptors / Indicators  Discomfort    Pain Type  Surgical pain    Pain Onset  More than a month ago         [facility] PT Assessment - 11/12/17 0001      Assessment   Medical Diagnosis  left total knee replacement  Onset Date/Surgical Date  08/24/17    Hand Dominance  Right    Next MD Visit  11/14/2017      Restrictions   Weight Bearing Restrictions  No      ROM / Strength   AROM / PROM / Strength  AROM      AROM   Overall AROM   Deficits    AROM Assessment Site  Knee    Right/Left Knee  Left    Left Knee Extension  -8    Left Knee Flexion  113                  [facility] Adult PT Treatment/Exercise - 11/12/17 0001      Knee/Hip Exercises: Aerobic   Stationary Bike  L3, seat 10 x13 min      Knee/Hip Exercises: Machines for Strengthening   Cybex Knee Extension  20# 3x10 reps    Cybex Knee Flexion  40# 3x10 reps    Cybex Leg Press  2.5 pl ,seat 5 x30 reps      Knee/Hip Exercises: Standing   Forward Step Up  3 sets;10 reps;Left;Hand Hold: 1;Step Height: 8"    Step Down  Left;2 sets;10 reps;Hand Hold: 2;Step Height: 4"      Modalities    Modalities  Psychologist, educational Location  L knee    Electrical Stimulation Action  IFC    Electrical Stimulation Parameters  1-10 hz x15 min    Electrical Stimulation Goals  Pain      Vasopneumatic   Number Minutes Vasopneumatic   15 minutes    Vasopnuematic Location   Knee    Vasopneumatic Pressure  Medium    Vasopneumatic Temperature   34      Manual Therapy   Manual Therapy  Passive ROM    Passive ROM  PROM of L knee into flex, ext with holds atend range for ROM               PT Short Term Goals - 09/12/17 1243      PT SHORT TERM GOAL #1   Title  STG's=LTG's.        PT Long Term Goals - 11/12/17 0815      PT LONG TERM GOAL #1   Title  Independent with a HEP.    Period  Weeks    Status  Achieved      PT LONG TERM GOAL #2   Title  Full active left knee extension in order to normalize gait.    Status  On-going -8 deg from neutral 11/12/2017      PT LONG TERM GOAL #3   Title  Active left knee flexion to 115 degrees+ so the patient can perform functional tasks and do so with pain not > 2-3/10.    Status  On-going 113 deg flexion 11/12/2017      PT LONG TERM GOAL #4   Title  Increase left knee strength to a solid 5/5 to provide good stability for accomplishment of functional activities.    Status  On-going      PT LONG TERM GOAL #5   Title  Perform a reciprocating stair gait with one railing.    Status  On-going      PT LONG TERM GOAL #6   Title  Pt will be albe to improve R knee pain </= 3/10 with amb and ADL's.     Status  On-going  Plan - 11/12/17 0818    Clinical Impression Statement  Patient tolerated today's treatment well with reports of L knee pain upon arrival but no reports of any increased L knee pain with exercises. Patient able to complete all exercises as directed with only reports of LBP with heel dot. AROM of L knee measured as 8-113 deg. Normal modalities  response noted following removal of the modalities.    Rehab Potential  Excellent    PT Frequency  3x / week    PT Duration  6 weeks    PT Treatment/Interventions  ADLs/Self Care Home Management;Cryotherapy;Electrical Stimulation;Gait training;Stair training;Functional mobility training;Therapeutic activities;Therapeutic exercise;Neuromuscular re-education;Patient/family education;Passive range of motion;Manual techniques;Vasopneumatic Device    PT Next Visit Plan  cont with POC for therapeutic exercises to improve range of motion.    PT Home Exercise Plan  Quad sets, heel slides in supine and sitting.    Consulted and Agree with Plan of Care  Patient       Patient will benefit from skilled therapeutic intervention in order to improve the following deficits and impairments:  Abnormal gait, Decreased activity tolerance, Decreased mobility, Decreased range of motion, Increased edema, Decreased strength, Pain  Visit Diagnosis: Chronic pain of left knee  Stiffness of left knee, not elsewhere classified  Localized edema     Problem List Patient Active Problem List   Diagnosis Date Noted  . S/P TKR (total knee replacement), left 08/24/2017  . Status post total knee replacement, right 03/02/2017  . Chronic intractable headache 08/09/2016  . Other specified hypothyroidism 03/02/2016  . A-fib ([place]) 12/24/2015  . Non-ischemic cardiomyopathy ([place]) 11/07/2015  . Long term (current) use of anticoagulants 11/07/2015  . Diaphoresis 11/01/2015  . Chest pain 11/01/2015  . PAF (paroxysmal atrial fibrillation) ([place]) 07/15/2015  . Heart palpitations 07/11/2015  . Dyspnea 02/18/2015  . Anemia, iron deficiency 09/10/2013  . Diaphragmatic hernia without mention of obstruction or gangrene   . Hypothyroidism   . Malaise and fatigue   . Arteritis, unspecified ([place])   . Iron deficiency anemia, unspecified   . Obesity   . BPH (benign prostatic hyperplasia)   . Essential hypertension, benign   .  Hyperlipidemia     Standley Brooking, PTA 11/12/2017, 8:27 AM  [facility] [address] [place], [place], [address] Phone: [phone]   Fax:  [phone]  Name: Troy Acosta MRN: [id number] Date of Birth: Oct 11, 1950

## 2017-11-14 ENCOUNTER — Encounter: Payer: Self-pay | Admitting: Physical Therapy

## 2017-11-14 ENCOUNTER — Ambulatory Visit: Payer: BLUE CROSS/BLUE SHIELD | Admitting: Physical Therapy

## 2017-11-14 DIAGNOSIS — R600 Localized edema: Secondary | ICD-10-CM

## 2017-11-14 DIAGNOSIS — E039 Hypothyroidism, unspecified: Secondary | ICD-10-CM | POA: Diagnosis not present

## 2017-11-14 DIAGNOSIS — G8929 Other chronic pain: Secondary | ICD-10-CM | POA: Diagnosis not present

## 2017-11-14 DIAGNOSIS — M25562 Pain in left knee: Secondary | ICD-10-CM | POA: Diagnosis not present

## 2017-11-14 DIAGNOSIS — M25662 Stiffness of left knee, not elsewhere classified: Secondary | ICD-10-CM

## 2017-11-14 NOTE — Therapy (Signed)
[facility] [place], [place], [address] Phone: [phone]   Fax:  [phone]  Physical Therapy Treatment  Patient Details  Name: Troy Acosta MRN: [id number] Date of Birth: [birth year] Referring Provider: Esmond Plants MD   Encounter Date: 11/14/2017  PT End of Session - 11/14/17 0751    Visit Number  24    Number of Visits  30    Date for PT Re-Evaluation  12/12/17    PT Start Time  0730    PT Stop Time  0829    PT Time Calculation (min)  59 min    Activity Tolerance  Patient tolerated treatment well    Behavior During Therapy  [facility] for tasks assessed/performed       Past Medical History:  Diagnosis Date  . Anxiety   . Arthritis    "all my joints" (08/24/2017)  . Diaphragmatic hernia without mention of obstruction or gangrene   . Dysrhythmia    afib, ablation- 12/2015, no problems since, off blood thinner- 09/2016  . Essential hypertension, benign   . Headache    had MRI- was consulted with Dr. Jannifer Franklin,, headache has resolved"probably related to caffeine"  . Hyperplasia of prostate   . Iron deficiency anemia, unspecified   . Malaise and fatigue   . Obesity   . Other and unspecified hyperlipidemia   . Paroxysmal atrial fibrillation (HCC)   . PONV (postoperative nausea and vomiting)   . Unspecified hypothyroidism   . Wears dentures     Past Surgical History:  Procedure Laterality Date  . COLONOSCOPY WITH ESOPHAGOGASTRODUODENOSCOPY (EGD)    . ELECTROPHYSIOLOGIC STUDY N/A 12/24/2015   Afib ablation by Dr Rayann Heman  . JOINT REPLACEMENT    . KNEE ARTHROSCOPY Right ~ 2016  . LUNG BIOPSY  1990s   "no problems identified"  . MULTIPLE TOOTH EXTRACTIONS    . TOTAL KNEE ARTHROPLASTY Right 03/02/2017   Procedure: RIGHT TOTAL KNEE ARTHROPLASTY;  Surgeon: [name], MD;  Location: [place];  Service: Orthopedics;  Laterality: Right;  . TOTAL KNEE ARTHROPLASTY Left 08/24/2017  . TOTAL KNEE ARTHROPLASTY Left 08/24/2017   Procedure: LEFT TOTAL KNEE ARTHROPLASTY;  Surgeon: [name], MD;  Location: [place];  Service: Orthopedics;  Laterality: Left;    There were no vitals filed for this visit.  Subjective Assessment - 11/14/17 0750    Subjective  Reports this knee began hurting a lot early this morning.    Pertinent History  Right total knee replacement/ Left manipulaton    Limitations  Walking;Standing    How long can you sit comfortably?  2 hours    How long can you stand comfortably?  2 hours    How long can you walk comfortably?  Short community distances.  He is not driving yet.    Diagnostic tests  X-ray     Patient Stated Goals  2hrs    Currently in Pain?  Yes    Pain Score  3     Pain Location  Knee    Pain Orientation  Left    Pain Descriptors / Indicators  Discomfort    Pain Type  Surgical pain    Pain Onset  More than a month ago         [facility] PT Assessment - 11/14/17 0001      Assessment   Medical Diagnosis  left total knee replacement    Onset Date/Surgical Date  08/24/17    Hand Dominance  Right    Next  MD Visit  11/14/2017      Restrictions   Weight Bearing Restrictions  No      ROM / Strength   AROM / PROM / Strength  AROM      AROM   Overall AROM   Deficits    AROM Assessment Site  Knee    Right/Left Knee  Left    Left Knee Extension  -7    Left Knee Flexion  114                  [facility] Adult PT Treatment/Exercise - 11/14/17 0001      Knee/Hip Exercises: Aerobic   Stationary Bike  x15 min      Knee/Hip Exercises: Machines for Strengthening   Cybex Knee Extension  20# 3x10 reps    Cybex Knee Flexion  50# 4x10 reps    Cybex Leg Press  2.5 pl ,seat 5 x30 reps      Knee/Hip Exercises: Standing   Forward Step Up  3 sets;10 reps;Left;Hand Hold: 1;Step Height: 8"    Step Down  Left;2 sets;10 reps;Hand Hold: 2;Step Height: 4"      Modalities   Modalities  Psychologist, educational Location   L knee    Electrical Stimulation Action  IFC    Electrical Stimulation Parameters  1-10 hz x15 min    Electrical Stimulation Goals  Pain      Vasopneumatic   Number Minutes Vasopneumatic   15 minutes    Vasopnuematic Location   Knee    Vasopneumatic Pressure  Medium    Vasopneumatic Temperature   34               PT Short Term Goals - 09/12/17 1243      PT SHORT TERM GOAL #1   Title  STG's=LTG's.        PT Long Term Goals - 11/14/17 0802      PT LONG TERM GOAL #1   Title  Independent with a HEP.    Period  Weeks    Status  Achieved      PT LONG TERM GOAL #2   Title  Full active left knee extension in order to normalize gait.    Status  Not Met -7 deg from neutral 11/14/2017      PT LONG TERM GOAL #3   Title  Active left knee flexion to 115 degrees+ so the patient can perform functional tasks and do so with pain not > 2-3/10.    Status  Partially Met 114 deg flexion 11/14/2017      PT LONG TERM GOAL #4   Title  Increase left knee strength to a solid 5/5 to provide good stability for accomplishment of functional activities.    Status  Achieved      PT LONG TERM GOAL #5   Title  Perform a reciprocating stair gait with one railing.    Status  Achieved      PT LONG TERM GOAL #6   Title  Pt will be albe to improve R knee pain </= 3/10 with amb and ADL's.     Status  Not Met 5/10 pain with ADLs 11/14/2017            Plan - 11/14/17 0817    Clinical Impression Statement  Patient tolerated today's treatment well and arrived with reports of increased pain at approximately 4 am. Patient able to complete all machine  strengthening exercises without complaints of pain. Only minimal VCing required to reinforce proper technique for heel dot. AROM of L knee measured as 7-114 deg. Patient able to reciprically ambulate stairs which patient reports he is comfortable with because he has stairs at home. L knee MMT measured as 5/5 throughout today. Normal modalities response noted  following removal of modalities for pain. Patient very active at home with outside and exercises for L knee imrpovement.    Rehab Potential  Excellent    PT Frequency  3x / week    PT Duration  6 weeks    PT Treatment/Interventions  ADLs/Self Care Home Management;Cryotherapy;Electrical Stimulation;Gait training;Stair training;Functional mobility training;Therapeutic activities;Therapeutic exercise;Neuromuscular re-education;Patient/family education;Passive range of motion;Manual techniques;Vasopneumatic Device    PT Next Visit Plan  Continue per MD discretion.    PT Home Exercise Plan  Quad sets, heel slides in supine and sitting.    Consulted and Agree with Plan of Care  Patient       Patient will benefit from skilled therapeutic intervention in order to improve the following deficits and impairments:  Abnormal gait, Decreased activity tolerance, Decreased mobility, Decreased range of motion, Increased edema, Decreased strength, Pain  Visit Diagnosis: Chronic pain of left knee  Stiffness of left knee, not elsewhere classified  Localized edema     Problem List Patient Active Problem List   Diagnosis Date Noted  . S/P TKR (total knee replacement), left 08/24/2017  . Status post total knee replacement, right 03/02/2017  . Chronic intractable headache 08/09/2016  . Other specified hypothyroidism 03/02/2016  . A-fib ([place]) 12/24/2015  . Non-ischemic cardiomyopathy ([place]) 11/07/2015  . Long term (current) use of anticoagulants 11/07/2015  . Diaphoresis 11/01/2015  . Chest pain 11/01/2015  . PAF (paroxysmal atrial fibrillation) ([place]) 07/15/2015  . Heart palpitations 07/11/2015  . Dyspnea 02/18/2015  . Anemia, iron deficiency 09/10/2013  . Diaphragmatic hernia without mention of obstruction or gangrene   . Hypothyroidism   . Malaise and fatigue   . Arteritis, unspecified ([place])   . Iron deficiency anemia, unspecified   . Obesity   . BPH (benign prostatic hyperplasia)   . Essential  hypertension, benign   . Hyperlipidemia     Standley Brooking, PTA 11/14/17 8:30 AM   [facility] [place], [place], [address] Phone: [phone]   Fax:  [phone]  Name: Leonidus Rowand MRN: [id number] Date of Birth: 1951/06/23

## 2017-11-16 ENCOUNTER — Ambulatory Visit: Payer: BLUE CROSS/BLUE SHIELD | Admitting: *Deleted

## 2017-11-16 DIAGNOSIS — G8929 Other chronic pain: Secondary | ICD-10-CM

## 2017-11-16 DIAGNOSIS — M25662 Stiffness of left knee, not elsewhere classified: Secondary | ICD-10-CM | POA: Diagnosis not present

## 2017-11-16 DIAGNOSIS — R600 Localized edema: Secondary | ICD-10-CM

## 2017-11-16 DIAGNOSIS — M25562 Pain in left knee: Secondary | ICD-10-CM | POA: Diagnosis not present

## 2017-11-16 NOTE — Therapy (Signed)
[facility] [place], [place], [address] Phone: [phone]   Fax:  [phone]  Physical Therapy Treatment  Patient Details  Name: Troy Acosta MRN: [id number] Date of Birth: 1951/09/29 Referring Provider: Esmond Plants MD   Encounter Date: 11/16/2017  PT End of Session - 11/16/17 1055    Visit Number  25    Number of Visits  30    Date for PT Re-Evaluation  12/12/17    PT Start Time  1030    PT Stop Time  1128    PT Time Calculation (min)  58 min       Past Medical History:  Diagnosis Date  . Anxiety   . Arthritis    "all my joints" (08/24/2017)  . Diaphragmatic hernia without mention of obstruction or gangrene   . Dysrhythmia    afib, ablation- 12/2015, no problems since, off blood thinner- 09/2016  . Essential hypertension, benign   . Headache    had MRI- was consulted with Dr. Jannifer Franklin,, headache has resolved"probably related to caffeine"  . Hyperplasia of prostate   . Iron deficiency anemia, unspecified   . Malaise and fatigue   . Obesity   . Other and unspecified hyperlipidemia   . Paroxysmal atrial fibrillation (HCC)   . PONV (postoperative nausea and vomiting)   . Unspecified hypothyroidism   . Wears dentures     Past Surgical History:  Procedure Laterality Date  . COLONOSCOPY WITH ESOPHAGOGASTRODUODENOSCOPY (EGD)    . ELECTROPHYSIOLOGIC STUDY N/A 12/24/2015   Afib ablation by Dr Rayann Heman  . JOINT REPLACEMENT    . KNEE ARTHROSCOPY Right ~ 2016  . LUNG BIOPSY  1990s   "no problems identified"  . MULTIPLE TOOTH EXTRACTIONS    . TOTAL KNEE ARTHROPLASTY Right 03/02/2017   Procedure: RIGHT TOTAL KNEE ARTHROPLASTY;  Surgeon: [name], MD;  Location: [place];  Service: Orthopedics;  Laterality: Right;  . TOTAL KNEE ARTHROPLASTY Left 08/24/2017  . TOTAL KNEE ARTHROPLASTY Left 08/24/2017   Procedure: LEFT TOTAL KNEE ARTHROPLASTY;  Surgeon: [name], MD;  Location: [place];  Service: Orthopedics;   Laterality: Left;    There were no vitals filed for this visit.  Subjective Assessment - 11/16/17 1051    Subjective  LT knee sore 3/10. BTW for 4 hours a day on Monday    Pertinent History  Right total knee replacement/ Left manipulaton    Limitations  Walking;Standing    How long can you sit comfortably?  2 hours    How long can you stand comfortably?  2 hours    How long can you walk comfortably?  Short community distances.  He is not driving yet.    Diagnostic tests  X-ray     Patient Stated Goals  2hrs    Currently in Pain?  Yes    Pain Score  3     Pain Orientation  Left    Pain Descriptors / Indicators  Discomfort    Pain Onset  More than a month ago                      [facility] Adult PT Treatment/Exercise - 11/16/17 0001      Knee/Hip Exercises: Aerobic   Stationary Bike  x15 min      Knee/Hip Exercises: Machines for Strengthening   Cybex Knee Extension  20# 3x10 reps    Cybex Knee Flexion  50# 4x10 reps    Cybex Leg Press  2.5 pl ,  seat 5 x30 reps      Modalities   Modalities  Electrical Stimulation;Vasopneumatic      Electrical Stimulation   Electrical Stimulation Location  L knee IFC 1-10 hz x 15 mins    Electrical Stimulation Goals  Pain      Vasopneumatic   Number Minutes Vasopneumatic   15 minutes    Vasopnuematic Location   Knee    Vasopneumatic Pressure  Medium    Vasopneumatic Temperature   34               PT Short Term Goals - 09/12/17 1243      PT SHORT TERM GOAL #1   Title  STG's=LTG's.        PT Long Term Goals - 11/14/17 0802      PT LONG TERM GOAL #1   Title  Independent with a HEP.    Period  Weeks    Status  Achieved      PT LONG TERM GOAL #2   Title  Full active left knee extension in order to normalize gait.    Status  Not Met -7 deg from neutral 11/14/2017      PT LONG TERM GOAL #3   Title  Active left knee flexion to 115 degrees+ so the patient can perform functional tasks and do so with pain not >  2-3/10.    Status  Partially Met 114 deg flexion 11/14/2017      PT LONG TERM GOAL #4   Title  Increase left knee strength to a solid 5/5 to provide good stability for accomplishment of functional activities.    Status  Achieved      PT LONG TERM GOAL #5   Title  Perform a reciprocating stair gait with one railing.    Status  Achieved      PT LONG TERM GOAL #6   Title  Pt will be albe to improve R knee pain </= 3/10 with amb and ADL's.     Status  Not Met 5/10 pain with ADLs 11/14/2017            Plan - 11/18/17 1140    Clinical Impression Statement  Pt arrived today not feeling well due to head-cold. He was still able to perform therex for ROM and strengthening for LT knee. ROM was to 115 degrees of flexion today. Normal modality response today. BTW 4 hr day next week. FOTO 38% limited    Rehab Potential  Excellent    PT Frequency  3x / week    PT Duration  6 weeks    PT Treatment/Interventions  ADLs/Self Care Home Management;Cryotherapy;Electrical Stimulation;Gait training;Stair training;Functional mobility training;Therapeutic activities;Therapeutic exercise;Neuromuscular re-education;Patient/family education;Passive range of motion;Manual techniques;Vasopneumatic Device    PT Next Visit Plan  Continue per MD discretion.    PT Home Exercise Plan  Quad sets, heel slides in supine and sitting.       Patient will benefit from skilled therapeutic intervention in order to improve the following deficits and impairments:  Abnormal gait, Decreased activity tolerance, Decreased mobility, Decreased range of motion, Increased edema, Decreased strength, Pain  Visit Diagnosis: Chronic pain of left knee  Stiffness of left knee, not elsewhere classified  Localized edema   G-Codes - November 18, 2017 1220    Functional Assessment Tool Used (Outpatient Only)  FOTO visit 25  38% limitation       Problem List Patient Active Problem List   Diagnosis Date Noted  . S/P TKR (total knee  replacement), left 08/24/2017  . Status post total knee replacement, right 03/02/2017  . Chronic intractable headache 08/09/2016  . Other specified hypothyroidism 03/02/2016  . A-fib ([place]) 12/24/2015  . Non-ischemic cardiomyopathy ([place]) 11/07/2015  . Long term (current) use of anticoagulants 11/07/2015  . Diaphoresis 11/01/2015  . Chest pain 11/01/2015  . PAF (paroxysmal atrial fibrillation) ([place]) 07/15/2015  . Heart palpitations 07/11/2015  . Dyspnea 02/18/2015  . Anemia, iron deficiency 09/10/2013  . Diaphragmatic hernia without mention of obstruction or gangrene   . Hypothyroidism   . Malaise and fatigue   . Arteritis, unspecified ([place])   . Iron deficiency anemia, unspecified   . Obesity   . BPH (benign prostatic hyperplasia)   . Essential hypertension, benign   . Hyperlipidemia     Abeeha Twist,CHRIS, PTA 11/16/2017, 12:21 PM  [facility] [address] [place], [place], [address] Phone: [phone]   Fax:  [phone]  Name: Troy Acosta MRN: [id number] Date of Birth: 1951/05/21

## 2017-11-17 ENCOUNTER — Encounter: Payer: Self-pay | Admitting: Pediatrics

## 2017-11-17 ENCOUNTER — Ambulatory Visit: Payer: BLUE CROSS/BLUE SHIELD | Admitting: Pediatrics

## 2017-11-17 VITALS — BP 130/81 | HR 87 | Temp 97.5°F | Ht 71.0 in | Wt 252.6 lb

## 2017-11-17 DIAGNOSIS — G4700 Insomnia, unspecified: Secondary | ICD-10-CM | POA: Diagnosis not present

## 2017-11-17 DIAGNOSIS — J069 Acute upper respiratory infection, unspecified: Secondary | ICD-10-CM

## 2017-11-17 DIAGNOSIS — R197 Diarrhea, unspecified: Secondary | ICD-10-CM

## 2017-11-17 DIAGNOSIS — F419 Anxiety disorder, unspecified: Secondary | ICD-10-CM

## 2017-11-17 NOTE — Progress Notes (Signed)
  Subjective:   Patient ID: Troy Acosta, male    DOB: 1951/10/01, 67 y.o.   MRN: [id number] CC: Diarrhea (12 hours wed night ); Cough (x 3 days); and Fever (101.9 thur morning)  HPI: Troy Acosta is a 67 y.o. male presenting for Diarrhea (12 hours wed night ); Cough (x 3 days); and Fever (101.9 thur morning)  Diarrhea: about 10 episodes 3 nights ago. No blood in stool. Normal stool this morning. Appetite down yesterday, back to normal today Coughing a lot past few days, better today, no SOB, non-productive Fever three days ago, none past two days Feeling better today than yesterday but still tired, not back to normal yet  Having a hard time sleeping Says his knee has been keeping him up some Takes muscle relaxer usually at night, sometimes pain medication at night Gets up every 30 minutes Feels tired during the day Doesn't think he snores Past few months since he has been out of work has had sleep trouble  Thinks about how people at work are getting on a lot at night, has a hard time shutting off his mind Going back to work parttime in 2 days  Knee continues to bother him  Relevant past medical, surgical, family and social history reviewed. Allergies and medications reviewed and updated. Social History   Tobacco Use  Smoking Status Former Smoker  . Years: 22.00  . Types: Cigars  . Last attempt to quit: 12/06/1992  . Years since quitting: 24.9  Smokeless Tobacco Never Used   ROS: Per HPI   Objective:    BP 130/81   Pulse 87   Temp (!) 97.5 F (36.4 C) (Oral)   Ht 5\' 11"  (1.803 m)   Wt 252 lb 9.6 oz (114.6 kg)   SpO2 97%   BMI 35.23 kg/m   Wt Readings from Last 3 Encounters:  11/17/17 252 lb 9.6 oz (114.6 kg)  10/19/17 251 lb (113.9 kg)  10/05/17 252 lb (114.3 kg)    Gen: NAD, alert, cooperative with exam, NCAT EYES: EOMI, no conjunctival injection, or no icterus ENT:  TMs dull gray b/l, OP without erythema LYMPH: no cervical LAD CV: NRRR, normal S1/S2, no murmur,  distal pulses 2+ b/l Resp: CTABL, no wheezes, normal WOB Abd: +BS, soft, NTND. no guarding or organomegaly Ext: No edema, warm Neuro: Alert and oriented, strength equal b/l UE and LE, coordination grossly normal MSK: normal muscle bulk  Assessment & Plan:  Troy Acosta was seen today for diarrhea, cough and fever.  Diagnoses and all orders for this visit:  Acute URI Improving. Symptom care discussed.  Diarrhea, unspecified type Resolved, may be related to viral syndrome   Insomnia, unspecified type Discussed sleep hygiene, can try melatonin. Pt describes fair amount of anxiety, worry keeps him awake most nights. Worse since out of work with knee pain, going back to work next week.   Anxiety Pt not interested in medication or counseling now. Will d/w pcp if ongoing issue  I spent 25 minutes with the patient with over 50% of the encounter time dedicated to counseling on the above problems.   Follow up plan: Return if symptoms worsen or fail to improve. Assunta Found, MD [place]

## 2017-11-19 ENCOUNTER — Ambulatory Visit: Payer: BLUE CROSS/BLUE SHIELD | Admitting: Physical Therapy

## 2017-11-19 ENCOUNTER — Encounter: Payer: Self-pay | Admitting: Physical Therapy

## 2017-11-19 DIAGNOSIS — M25562 Pain in left knee: Principal | ICD-10-CM

## 2017-11-19 DIAGNOSIS — R600 Localized edema: Secondary | ICD-10-CM

## 2017-11-19 DIAGNOSIS — M25662 Stiffness of left knee, not elsewhere classified: Secondary | ICD-10-CM | POA: Diagnosis not present

## 2017-11-19 DIAGNOSIS — G8929 Other chronic pain: Secondary | ICD-10-CM | POA: Diagnosis not present

## 2017-11-19 NOTE — Therapy (Signed)
[facility] [place], [place], [address] Phone: [phone]   Fax:  [phone]  Physical Therapy Treatment  Patient Details  Name: Troy Acosta MRN: [id number] Date of Birth: 01-05-1951 Referring Provider: Esmond Plants MD   Encounter Date: 11/19/2017  PT End of Session - 11/19/17 1435    Visit Number  26    Number of Visits  30    Date for PT Re-Evaluation  12/12/17    PT Start Time  8182    PT Stop Time  1432    PT Time Calculation (min)  47 min    Activity Tolerance  Patient tolerated treatment well    Behavior During Therapy  [facility] for tasks assessed/performed       Past Medical History:  Diagnosis Date  . Anxiety   . Arthritis    "all my joints" (08/24/2017)  . Diaphragmatic hernia without mention of obstruction or gangrene   . Dysrhythmia    afib, ablation- 12/2015, no problems since, off blood thinner- 09/2016  . Essential hypertension, benign   . Headache    had MRI- was consulted with Dr. Jannifer Franklin,, headache has resolved"probably related to caffeine"  . Hyperplasia of prostate   . Iron deficiency anemia, unspecified   . Malaise and fatigue   . Obesity   . Other and unspecified hyperlipidemia   . Paroxysmal atrial fibrillation (HCC)   . PONV (postoperative nausea and vomiting)   . Unspecified hypothyroidism   . Wears dentures     Past Surgical History:  Procedure Laterality Date  . COLONOSCOPY WITH ESOPHAGOGASTRODUODENOSCOPY (EGD)    . ELECTROPHYSIOLOGIC STUDY N/A 12/24/2015   Afib ablation by Dr Rayann Heman  . JOINT REPLACEMENT    . KNEE ARTHROSCOPY Right ~ 2016  . LUNG BIOPSY  1990s   "no problems identified"  . MULTIPLE TOOTH EXTRACTIONS    . TOTAL KNEE ARTHROPLASTY Right 03/02/2017   Procedure: RIGHT TOTAL KNEE ARTHROPLASTY;  Surgeon: [name], MD;  Location: [place];  Service: Orthopedics;  Laterality: Right;  . TOTAL KNEE ARTHROPLASTY Left 08/24/2017  . TOTAL KNEE ARTHROPLASTY Left 08/24/2017    Procedure: LEFT TOTAL KNEE ARTHROPLASTY;  Surgeon: [name], MD;  Location: [place];  Service: Orthopedics;  Laterality: Left;    There were no vitals filed for this visit.  Subjective Assessment - 11/19/17 1348    Subjective  Reports that he went back to work today and states that his knee is sore from walking on the concrete floors. States that he is down to 1 pain pill a day.    Pertinent History  Right total knee replacement/ Left manipulaton    Limitations  Walking;Standing    How long can you sit comfortably?  2 hours    How long can you stand comfortably?  2 hours    How long can you walk comfortably?  Short community distances.  He is not driving yet.    Diagnostic tests  X-ray     Patient Stated Goals  2hrs    Currently in Pain?  Yes    Pain Score  4     Pain Location  Knee    Pain Orientation  Left    Pain Descriptors / Indicators  Sore;Discomfort    Pain Type  Surgical pain    Pain Onset  More than a month ago         [facility] PT Assessment - 11/19/17 0001      Assessment   Medical Diagnosis  left total knee replacement    Onset Date/Surgical Date  08/24/17    Hand Dominance  Right    Next MD Visit  12/11/2017      Restrictions   Weight Bearing Restrictions  No                  [facility] Adult PT Treatment/Exercise - 11/19/17 0001      Knee/Hip Exercises: Aerobic   Stationary Bike  x15 min      Knee/Hip Exercises: Machines for Strengthening   Cybex Knee Extension  20# 3x10 reps    Cybex Knee Flexion  40# 3x10 reps    Cybex Leg Press  3 pl, seat 7 x30 reps      Knee/Hip Exercises: Standing   Step Down  Left;20 reps;Hand Hold: 2;Step Height: 4" forward and lateral heel dot    Wall Squat  5 reps;3 seconds      Modalities   Modalities  Electrical Stimulation;Vasopneumatic      Electrical Stimulation   Electrical Stimulation Location  L knee    Electrical Stimulation Action  IFC    Electrical Stimulation Parameters  1-10 hz x15 min    Electrical  Stimulation Goals  Pain      Vasopneumatic   Number Minutes Vasopneumatic   15 minutes    Vasopnuematic Location   Knee    Vasopneumatic Pressure  Medium    Vasopneumatic Temperature   34               PT Short Term Goals - 09/12/17 1243      PT SHORT TERM GOAL #1   Title  STG's=LTG's.        PT Long Term Goals - 11/14/17 0802      PT LONG TERM GOAL #1   Title  Independent with a HEP.    Period  Weeks    Status  Achieved      PT LONG TERM GOAL #2   Title  Full active left knee extension in order to normalize gait.    Status  Not Met -7 deg from neutral 11/14/2017      PT LONG TERM GOAL #3   Title  Active left knee flexion to 115 degrees+ so the patient can perform functional tasks and do so with pain not > 2-3/10.    Status  Partially Met 114 deg flexion 11/14/2017      PT LONG TERM GOAL #4   Title  Increase left knee strength to a solid 5/5 to provide good stability for accomplishment of functional activities.    Status  Achieved      PT LONG TERM GOAL #5   Title  Perform a reciprocating stair gait with one railing.    Status  Achieved      PT LONG TERM GOAL #6   Title  Pt will be albe to improve R knee pain </= 3/10 with amb and ADL's.     Status  Not Met 5/10 pain with ADLs 11/14/2017            Plan - 11/19/17 1425    Clinical Impression Statement  Patient tolerated today's treatment well although he reported increased soreness from returning to work today. Patient had no complaint with any exercises today other than wall squats and heel dots. Patient reported B knee discomfort with wall squats and fatigue with heel dots. Normal modalities response noted following removal of the modalities.    Rehab Potential  Excellent  PT Frequency  3x / week    PT Duration  6 weeks    PT Treatment/Interventions  ADLs/Self Care Home Management;Cryotherapy;Electrical Stimulation;Gait training;Stair training;Functional mobility training;Therapeutic  activities;Therapeutic exercise;Neuromuscular re-education;Patient/family education;Passive range of motion;Manual techniques;Vasopneumatic Device    PT Next Visit Plan  Continue per MD discretion.    PT Home Exercise Plan  Quad sets, heel slides in supine and sitting.    Consulted and Agree with Plan of Care  Patient       Patient will benefit from skilled therapeutic intervention in order to improve the following deficits and impairments:  Abnormal gait, Decreased activity tolerance, Decreased mobility, Decreased range of motion, Increased edema, Decreased strength, Pain  Visit Diagnosis: Chronic pain of left knee  Stiffness of left knee, not elsewhere classified  Localized edema     Problem List Patient Active Problem List   Diagnosis Date Noted  . S/P TKR (total knee replacement), left 08/24/2017  . Status post total knee replacement, right 03/02/2017  . Chronic intractable headache 08/09/2016  . Other specified hypothyroidism 03/02/2016  . A-fib ([place]) 12/24/2015  . Non-ischemic cardiomyopathy ([place]) 11/07/2015  . Long term (current) use of anticoagulants 11/07/2015  . Diaphoresis 11/01/2015  . Chest pain 11/01/2015  . PAF (paroxysmal atrial fibrillation) ([place]) 07/15/2015  . Heart palpitations 07/11/2015  . Dyspnea 02/18/2015  . Anemia, iron deficiency 09/10/2013  . Diaphragmatic hernia without mention of obstruction or gangrene   . Hypothyroidism   . Malaise and fatigue   . Arteritis, unspecified ([place])   . Iron deficiency anemia, unspecified   . Obesity   . BPH (benign prostatic hyperplasia)   . Essential hypertension, benign   . Hyperlipidemia     Standley Brooking, PTA 11/19/2017, 3:24 PM  [facility] [address] [place], [place], [address] Phone: [phone]   Fax:  [phone]  Name: Troy Acosta MRN: [id number] Date of Birth: 06/20/1951

## 2017-11-21 ENCOUNTER — Encounter: Payer: Self-pay | Admitting: Physical Therapy

## 2017-11-21 ENCOUNTER — Other Ambulatory Visit: Payer: BLUE CROSS/BLUE SHIELD

## 2017-11-21 ENCOUNTER — Ambulatory Visit: Payer: BLUE CROSS/BLUE SHIELD | Admitting: Physical Therapy

## 2017-11-21 DIAGNOSIS — M25562 Pain in left knee: Principal | ICD-10-CM

## 2017-11-21 DIAGNOSIS — R600 Localized edema: Secondary | ICD-10-CM

## 2017-11-21 DIAGNOSIS — E039 Hypothyroidism, unspecified: Secondary | ICD-10-CM

## 2017-11-21 DIAGNOSIS — R5383 Other fatigue: Secondary | ICD-10-CM

## 2017-11-21 DIAGNOSIS — M25662 Stiffness of left knee, not elsewhere classified: Secondary | ICD-10-CM

## 2017-11-21 DIAGNOSIS — G8929 Other chronic pain: Secondary | ICD-10-CM | POA: Diagnosis not present

## 2017-11-21 NOTE — Therapy (Signed)
[facility] [place], [place], [address] Phone: [phone]   Fax:  [phone]  Physical Therapy Treatment  Patient Details  Name: Troy Acosta MRN: [id number] Date of Birth: 01-09-1951 Referring Provider: Esmond Plants MD   Encounter Date: 11/21/2017  PT End of Session - 11/21/17 1034    Visit Number  27    Number of Visits  30    Date for PT Re-Evaluation  12/12/17    PT Start Time  1030    PT Stop Time  1125    PT Time Calculation (min)  55 min    Activity Tolerance  Patient tolerated treatment well    Behavior During Therapy  [facility] for tasks assessed/performed       Past Medical History:  Diagnosis Date  . Anxiety   . Arthritis    "all my joints" (08/24/2017)  . Diaphragmatic hernia without mention of obstruction or gangrene   . Dysrhythmia    afib, ablation- 12/2015, no problems since, off blood thinner- 09/2016  . Essential hypertension, benign   . Headache    had MRI- was consulted with Dr. Jannifer Franklin,, headache has resolved"probably related to caffeine"  . Hyperplasia of prostate   . Iron deficiency anemia, unspecified   . Malaise and fatigue   . Obesity   . Other and unspecified hyperlipidemia   . Paroxysmal atrial fibrillation (HCC)   . PONV (postoperative nausea and vomiting)   . Unspecified hypothyroidism   . Wears dentures     Past Surgical History:  Procedure Laterality Date  . COLONOSCOPY WITH ESOPHAGOGASTRODUODENOSCOPY (EGD)    . ELECTROPHYSIOLOGIC STUDY N/A 12/24/2015   Afib ablation by Dr Rayann Heman  . JOINT REPLACEMENT    . KNEE ARTHROSCOPY Right ~ 2016  . LUNG BIOPSY  1990s   "no problems identified"  . MULTIPLE TOOTH EXTRACTIONS    . TOTAL KNEE ARTHROPLASTY Right 03/02/2017   Procedure: RIGHT TOTAL KNEE ARTHROPLASTY;  Surgeon: [name], MD;  Location: [place];  Service: Orthopedics;  Laterality: Right;  . TOTAL KNEE ARTHROPLASTY Left 08/24/2017  . TOTAL KNEE ARTHROPLASTY Left 08/24/2017    Procedure: LEFT TOTAL KNEE ARTHROPLASTY;  Surgeon: [name], MD;  Location: [place];  Service: Orthopedics;  Laterality: Left;    There were no vitals filed for this visit.  Subjective Assessment - 11/21/17 1034    Subjective  Reports that he has not taken any pain medication in two days. Having more trouble out of his thyroid.    Pertinent History  Right total knee replacement/ Left manipulaton    Limitations  Walking;Standing    How long can you sit comfortably?  2 hours    How long can you stand comfortably?  2 hours    How long can you walk comfortably?  Short community distances.  He is not driving yet.    Diagnostic tests  X-ray     Patient Stated Goals  2hrs    Currently in Pain?  Yes    Pain Score  5     Pain Location  Knee    Pain Orientation  Left    Pain Descriptors / Indicators  Discomfort    Pain Type  Surgical pain    Pain Onset  More than a month ago         [facility] PT Assessment - 11/21/17 0001      Assessment   Medical Diagnosis  left total knee replacement    Onset Date/Surgical Date  08/24/17  Hand Dominance  Right    Next MD Visit  12/11/2017      Restrictions   Weight Bearing Restrictions  No                  [facility] Adult PT Treatment/Exercise - 11/21/17 0001      Knee/Hip Exercises: Aerobic   Stationary Bike  x15 min      Knee/Hip Exercises: Machines for Strengthening   Cybex Knee Extension  10# 3x10 reps    Cybex Knee Flexion  50# 3x10 reps LLE only    Cybex Leg Press  3 pl, seat 7 x30 reps      Knee/Hip Exercises: Standing   Terminal Knee Extension  Strengthening;Left;20 reps    Terminal Knee Extension Limitations  orange XTS    Walking with Sports Cord  3D walking with orange XTS x10 reps each side      Modalities   Modalities  Electrical Stimulation;Vasopneumatic      Electrical Stimulation   Electrical Stimulation Location  L knee    Electrical Stimulation Action  IFC    Electrical Stimulation Parameters  1-10 hz x15 min     Electrical Stimulation Goals  Pain      Vasopneumatic   Number Minutes Vasopneumatic   15 minutes    Vasopnuematic Location   Knee    Vasopneumatic Pressure  Medium    Vasopneumatic Temperature   34               PT Short Term Goals - 09/12/17 1243      PT SHORT TERM GOAL #1   Title  STG's=LTG's.        PT Long Term Goals - 11/14/17 0802      PT LONG TERM GOAL #1   Title  Independent with a HEP.    Period  Weeks    Status  Achieved      PT LONG TERM GOAL #2   Title  Full active left knee extension in order to normalize gait.    Status  Not Met -7 deg from neutral 11/14/2017      PT LONG TERM GOAL #3   Title  Active left knee flexion to 115 degrees+ so the patient can perform functional tasks and do so with pain not > 2-3/10.    Status  Partially Met 114 deg flexion 11/14/2017      PT LONG TERM GOAL #4   Title  Increase left knee strength to a solid 5/5 to provide good stability for accomplishment of functional activities.    Status  Achieved      PT LONG TERM GOAL #5   Title  Perform a reciprocating stair gait with one railing.    Status  Achieved      PT LONG TERM GOAL #6   Title  Pt will be albe to improve R knee pain </= 3/10 with amb and ADL's.     Status  Not Met 5/10 pain with ADLs 11/14/2017            Plan - 11/21/17 1134    Clinical Impression Statement  Patient presented in clinic with greater complaint of thryroid problems than knee issues. Patient able to tolerate machine strengthening with LLE only  with no complaints. Stabilization exercise attempted today with resisted walking. Normal modalities response noted following removal of the modalities.       Rehab Potential  Excellent    PT Frequency  3x / week  PT Duration  6 weeks    PT Treatment/Interventions  ADLs/Self Care Home Management;Cryotherapy;Electrical Stimulation;Gait training;Stair training;Functional mobility training;Therapeutic activities;Therapeutic exercise;Neuromuscular  re-education;Patient/family education;Passive range of motion;Manual techniques;Vasopneumatic Device    PT Next Visit Plan  Continue per MD discretion.    PT Home Exercise Plan  Quad sets, heel slides in supine and sitting.    Consulted and Agree with Plan of Care  Patient       Patient will benefit from skilled therapeutic intervention in order to improve the following deficits and impairments:  Abnormal gait, Decreased activity tolerance, Decreased mobility, Decreased range of motion, Increased edema, Decreased strength, Pain  Visit Diagnosis: Chronic pain of left knee  Stiffness of left knee, not elsewhere classified  Localized edema     Problem List Patient Active Problem List   Diagnosis Date Noted  . S/P TKR (total knee replacement), left 08/24/2017  . Status post total knee replacement, right 03/02/2017  . Chronic intractable headache 08/09/2016  . Other specified hypothyroidism 03/02/2016  . A-fib ([place]) 12/24/2015  . Non-ischemic cardiomyopathy ([place]) 11/07/2015  . Long term (current) use of anticoagulants 11/07/2015  . Diaphoresis 11/01/2015  . Chest pain 11/01/2015  . PAF (paroxysmal atrial fibrillation) ([place]) 07/15/2015  . Heart palpitations 07/11/2015  . Dyspnea 02/18/2015  . Anemia, iron deficiency 09/10/2013  . Diaphragmatic hernia without mention of obstruction or gangrene   . Hypothyroidism   . Malaise and fatigue   . Arteritis, unspecified ([place])   . Iron deficiency anemia, unspecified   . Obesity   . BPH (benign prostatic hyperplasia)   . Essential hypertension, benign   . Hyperlipidemia     Standley Brooking, PTA 11/21/2017, 11:46 AM  [facility] [address] [place], [place], [address] Phone: [phone]   Fax:  [phone]  Name: Troy Acosta MRN: [id number] Date of Birth: [birth year]

## 2017-11-22 LAB — THYROID PANEL WITH TSH
FREE THYROXINE INDEX: 2.2 (ref 1.2–4.9)
T3 UPTAKE RATIO: 26 % (ref 24–39)
T4, Total: 8.6 ug/dL (ref 4.5–12.0)
TSH: 4.18 u[IU]/mL (ref 0.450–4.500)

## 2017-11-23 ENCOUNTER — Encounter: Payer: Self-pay | Admitting: Physical Therapy

## 2017-11-23 ENCOUNTER — Ambulatory Visit: Payer: BLUE CROSS/BLUE SHIELD | Admitting: Physical Therapy

## 2017-11-23 DIAGNOSIS — M25562 Pain in left knee: Secondary | ICD-10-CM | POA: Diagnosis not present

## 2017-11-23 DIAGNOSIS — M25662 Stiffness of left knee, not elsewhere classified: Secondary | ICD-10-CM

## 2017-11-23 DIAGNOSIS — R600 Localized edema: Secondary | ICD-10-CM

## 2017-11-23 DIAGNOSIS — G8929 Other chronic pain: Secondary | ICD-10-CM | POA: Diagnosis not present

## 2017-11-23 NOTE — Therapy (Signed)
[facility] [place], [place], [address] Phone: [phone]   Fax:  [phone]  Physical Therapy Treatment  Patient Details  Name: Troy Acosta MRN: [id number] Date of Birth: [birth year] Referring Provider: Esmond Plants MD   Encounter Date: 11/23/2017  PT End of Session - 11/23/17 1033    Visit Number  28    Number of Visits  30    Date for PT Re-Evaluation  12/12/17    PT Start Time  1029    PT Stop Time  1122    PT Time Calculation (min)  53 min    Activity Tolerance  Patient tolerated treatment well    Behavior During Therapy  [facility] for tasks assessed/performed       Past Medical History:  Diagnosis Date  . Anxiety   . Arthritis    "all my joints" (08/24/2017)  . Diaphragmatic hernia without mention of obstruction or gangrene   . Dysrhythmia    afib, ablation- 12/2015, no problems since, off blood thinner- 09/2016  . Essential hypertension, benign   . Headache    had MRI- was consulted with Dr. Jannifer Franklin,, headache has resolved"probably related to caffeine"  . Hyperplasia of prostate   . Iron deficiency anemia, unspecified   . Malaise and fatigue   . Obesity   . Other and unspecified hyperlipidemia   . Paroxysmal atrial fibrillation (HCC)   . PONV (postoperative nausea and vomiting)   . Unspecified hypothyroidism   . Wears dentures     Past Surgical History:  Procedure Laterality Date  . COLONOSCOPY WITH ESOPHAGOGASTRODUODENOSCOPY (EGD)    . ELECTROPHYSIOLOGIC STUDY N/A 12/24/2015   Afib ablation by Dr Rayann Heman  . JOINT REPLACEMENT    . KNEE ARTHROSCOPY Right ~ 2016  . LUNG BIOPSY  1990s   "no problems identified"  . MULTIPLE TOOTH EXTRACTIONS    . TOTAL KNEE ARTHROPLASTY Right 03/02/2017   Procedure: RIGHT TOTAL KNEE ARTHROPLASTY;  Surgeon: [name], MD;  Location: [place];  Service: Orthopedics;  Laterality: Right;  . TOTAL KNEE ARTHROPLASTY Left 08/24/2017  . TOTAL KNEE ARTHROPLASTY Left 08/24/2017    Procedure: LEFT TOTAL KNEE ARTHROPLASTY;  Surgeon: [name], MD;  Location: [place];  Service: Orthopedics;  Laterality: Left;    There were no vitals filed for this visit.  Subjective Assessment - 11/23/17 1032    Subjective  "Hurts like hell and stiff." Reports he has not taken any pain medication in 4 days.    Pertinent History  Right total knee replacement/ Left manipulaton    Limitations  Walking;Standing    How long can you sit comfortably?  2 hours    How long can you stand comfortably?  2 hours    How long can you walk comfortably?  Short community distances.  He is not driving yet.    Diagnostic tests  X-ray     Patient Stated [name]         [facility] PT Assessment - 11/23/17 0001      Assessment   Medical Diagnosis  left total knee replacement    Onset Date/Surgical Date  08/24/17    Hand Dominance  Right    Next MD Visit  12/11/2017      Restrictions   Weight Bearing Restrictions  No                  [facility] Adult PT Treatment/Exercise - 11/23/17 0001      Knee/Hip Exercises: Aerobic  Stationary Bike  L3, seat 10-9 x15 min      Knee/Hip Exercises: Machines for Strengthening   Cybex Knee Extension  10# 3x10 reps for LLE only    Cybex Knee Flexion  30# 3x10 reps LLE only    Cybex Leg Press  3 pl, seat 7 x30 reps VCs for predominate LLE use      Knee/Hip Exercises: Standing   Step Down  Left;Hand Hold: 2;Step Height: 4" limited by L knee tightness      Modalities   Modalities  Electrical Stimulation;Cryotherapy      Cryotherapy   Number Minutes Cryotherapy  15 Minutes    Cryotherapy Location  Knee    Type of Cryotherapy  Ice pack      Electrical Stimulation   Electrical Stimulation Location  L knee    Electrical Stimulation Action  IFC    Electrical Stimulation Parameters  80-150 hz x15 min    Electrical Stimulation Goals  Pain      Manual Therapy   Manual Therapy  Myofascial release    Myofascial Release  IASTW to L knee surounding the  patella as well as distal quad, ITB, hip adductors               PT Short Term Goals - 09/12/17 1243      PT SHORT TERM GOAL #1   Title  STG's=LTG's.        PT Long Term Goals - 11/14/17 0802      PT LONG TERM GOAL #1   Title  Independent with a HEP.    Period  Weeks    Status  Achieved      PT LONG TERM GOAL #2   Title  Full active left knee extension in order to normalize gait.    Status  Not Met -7 deg from neutral 11/14/2017      PT LONG TERM GOAL #3   Title  Active left knee flexion to 115 degrees+ so the patient can perform functional tasks and do so with pain not > 2-3/10.    Status  Partially Met 114 deg flexion 11/14/2017      PT LONG TERM GOAL #4   Title  Increase left knee strength to a solid 5/5 to provide good stability for accomplishment of functional activities.    Status  Achieved      PT LONG TERM GOAL #5   Title  Perform a reciprocating stair gait with one railing.    Status  Achieved      PT LONG TERM GOAL #6   Title  Pt will be albe to improve R knee pain </= 3/10 with amb and ADL's.     Status  Not Met 5/10 pain with ADLs 11/14/2017            Plan - 11/23/17 1121    Clinical Impression Statement  Patient presented in clinic with continued L knee complaints of pain and stiffness. Patient guided through strengthening exercises in which LLE only used or emphasis on LLE use. Patient then attempted forward L step down to which he was limited with due to L knee tightness. IASTW completed to L knee which responded with a great deal of redness throughout the L knee. Patient educated that soreness could present following IASTW. Normal modalities response noted following removal of the modalities.    Rehab Potential  Excellent    PT Frequency  3x / week    PT Duration  6 weeks  PT Treatment/Interventions  ADLs/Self Care Home Management;Cryotherapy;Electrical Stimulation;Gait training;Stair training;Functional mobility training;Therapeutic  activities;Therapeutic exercise;Neuromuscular re-education;Patient/family education;Passive range of motion;Manual techniques;Vasopneumatic Device    PT Next Visit Plan  Continue per MD discretion.    PT Home Exercise Plan  Quad sets, heel slides in supine and sitting.    Consulted and Agree with Plan of Care  Patient       Patient will benefit from skilled therapeutic intervention in order to improve the following deficits and impairments:  Abnormal gait, Decreased activity tolerance, Decreased mobility, Decreased range of motion, Increased edema, Decreased strength, Pain  Visit Diagnosis: Chronic pain of left knee  Stiffness of left knee, not elsewhere classified  Localized edema     Problem List Patient Active Problem List   Diagnosis Date Noted  . S/P TKR (total knee replacement), left 08/24/2017  . Status post total knee replacement, right 03/02/2017  . Chronic intractable headache 08/09/2016  . Other specified hypothyroidism 03/02/2016  . A-fib ([place]) 12/24/2015  . Non-ischemic cardiomyopathy ([place]) 11/07/2015  . Long term (current) use of anticoagulants 11/07/2015  . Diaphoresis 11/01/2015  . Chest pain 11/01/2015  . PAF (paroxysmal atrial fibrillation) ([place]) 07/15/2015  . Heart palpitations 07/11/2015  . Dyspnea 02/18/2015  . Anemia, iron deficiency 09/10/2013  . Diaphragmatic hernia without mention of obstruction or gangrene   . Hypothyroidism   . Malaise and fatigue   . Arteritis, unspecified ([place])   . Iron deficiency anemia, unspecified   . Obesity   . BPH (benign prostatic hyperplasia)   . Essential hypertension, benign   . Hyperlipidemia     Standley Brooking, PTA 11/23/2017, 11:27 AM  [facility] [address] [place], [place], [address] Phone: [phone]   Fax:  [phone]  Name: Troy Acosta MRN: [id number] Date of Birth: November 20, 1950

## 2017-11-26 ENCOUNTER — Ambulatory Visit: Payer: BLUE CROSS/BLUE SHIELD | Admitting: Physical Therapy

## 2017-11-26 DIAGNOSIS — M25562 Pain in left knee: Secondary | ICD-10-CM | POA: Diagnosis not present

## 2017-11-26 DIAGNOSIS — G8929 Other chronic pain: Secondary | ICD-10-CM | POA: Diagnosis not present

## 2017-11-26 DIAGNOSIS — M25662 Stiffness of left knee, not elsewhere classified: Secondary | ICD-10-CM | POA: Diagnosis not present

## 2017-11-26 DIAGNOSIS — R600 Localized edema: Secondary | ICD-10-CM | POA: Diagnosis not present

## 2017-11-26 NOTE — Therapy (Signed)
[facility] [place], [place], [address] Phone: [phone]   Fax:  [phone]  Physical Therapy Treatment  Patient Details  Name: Troy Acosta MRN: [id number] Date of Birth: 09-Nov-1950 Referring Provider: Esmond Plants MD   Encounter Date: 11/26/2017  PT End of Session - 11/26/17 1037    Visit Number  29    Number of Visits  30    Date for PT Re-Evaluation  12/12/17    PT Start Time  1030    PT Stop Time  1132    PT Time Calculation (min)  62 min    Activity Tolerance  Patient tolerated treatment well    Behavior During Therapy  [facility] for tasks assessed/performed       Past Medical History:  Diagnosis Date  . Anxiety   . Arthritis    "all my joints" (08/24/2017)  . Diaphragmatic hernia without mention of obstruction or gangrene   . Dysrhythmia    afib, ablation- 12/2015, no problems since, off blood thinner- 09/2016  . Essential hypertension, benign   . Headache    had MRI- was consulted with Dr. Jannifer Franklin,, headache has resolved"probably related to caffeine"  . Hyperplasia of prostate   . Iron deficiency anemia, unspecified   . Malaise and fatigue   . Obesity   . Other and unspecified hyperlipidemia   . Paroxysmal atrial fibrillation (HCC)   . PONV (postoperative nausea and vomiting)   . Unspecified hypothyroidism   . Wears dentures     Past Surgical History:  Procedure Laterality Date  . COLONOSCOPY WITH ESOPHAGOGASTRODUODENOSCOPY (EGD)    . ELECTROPHYSIOLOGIC STUDY N/A 12/24/2015   Afib ablation by Dr Rayann Heman  . JOINT REPLACEMENT    . KNEE ARTHROSCOPY Right ~ 2016  . LUNG BIOPSY  1990s   "no problems identified"  . MULTIPLE TOOTH EXTRACTIONS    . TOTAL KNEE ARTHROPLASTY Right 03/02/2017   Procedure: RIGHT TOTAL KNEE ARTHROPLASTY;  Surgeon: [name], MD;  Location: [place];  Service: Orthopedics;  Laterality: Right;  . TOTAL KNEE ARTHROPLASTY Left 08/24/2017  . TOTAL KNEE ARTHROPLASTY Left 08/24/2017    Procedure: LEFT TOTAL KNEE ARTHROPLASTY;  Surgeon: [name], MD;  Location: [place];  Service: Orthopedics;  Laterality: Left;    There were no vitals filed for this visit.  Subjective Assessment - 11/26/17 1037    Subjective  "Hurts like hell". He states he walked a lot this past few days.    Pertinent History  Right total knee replacement/ Left manipulaton    Currently in Pain?  Yes still off pain pills    Pain Score  6     Pain Location  Knee    Pain Orientation  Left    Pain Descriptors / Indicators  Discomfort    Pain Type  Surgical pain                      [facility] Adult PT Treatment/Exercise - 11/26/17 0001      Ambulation/Gait   Ambulation/Gait  Yes    Ambulation/Gait Assistance  7: Independent    Ambulation Distance (Feet)  200 Feet    Assistive device  None    Gait Pattern  Decreased stance time - left;Decreased step length - right    Ambulation Surface  Level    Gait Comments  Able to normalize with increased step/stride length; climbs ousided stairs reciprocally      Knee/Hip Exercises: Aerobic   Stationary Bike  L3,  seat 10-9 x15 min      Knee/Hip Exercises: Standing   Step Down  Right;1 set;10 reps;Hand Hold: 2;Step Height: 8" with reverse L step up (no circumduction)    Walking with Sports Cord  4D walking with orange XTS x10 reps each side      Modalities   Modalities  Electrical Stimulation;Cryotherapy      Cryotherapy   Number Minutes Cryotherapy  15 Minutes    Cryotherapy Location  Knee    Type of Cryotherapy  Ice pack      Electrical Stimulation   Electrical Stimulation Location  L knee    Electrical Stimulation Action  IFC    Electrical Stimulation Parameters  80-150 Hz x 15 min    Electrical Stimulation Goals  Pain      Manual Therapy   Manual Therapy  Myofascial release    Manual therapy comments  patellar mobs WNL    Myofascial Release  IASTW to L knee surounding the patella as well as distal quad, ITB, hip adductors                PT Short Term Goals - 09/12/17 1243      PT SHORT TERM GOAL #1   Title  STG's=LTG's.        PT Long Term Goals - 11/14/17 0802      PT LONG TERM GOAL #1   Title  Independent with a HEP.    Period  Weeks    Status  Achieved      PT LONG TERM GOAL #2   Title  Full active left knee extension in order to normalize gait.    Status  Not Met -7 deg from neutral 11/14/2017      PT LONG TERM GOAL #3   Title  Active left knee flexion to 115 degrees+ so the patient can perform functional tasks and do so with pain not > 2-3/10.    Status  Partially Met 114 deg flexion 11/14/2017      PT LONG TERM GOAL #4   Title  Increase left knee strength to a solid 5/5 to provide good stability for accomplishment of functional activities.    Status  Achieved      PT LONG TERM GOAL #5   Title  Perform a reciprocating stair gait with one railing.    Status  Achieved      PT LONG TERM GOAL #6   Title  Pt will be albe to improve R knee pain </= 3/10 with amb and ADL's.     Status  Not Met 5/10 pain with ADLs 11/14/2017            Plan - 11/26/17 1119    Clinical Impression Statement  Patient did well with TE and gait today. He can normalize gait with enlarged step/stride length, but easily returns to antalgic gait pattern without cueing. He is able to reciprocally ascend and descend stairs. He also states that the knee is warmed up so is "working better". He says he can sit down for a short period and the knee stiffens right back up. He responded well to IASTM again with main tightness being in L distal ITB and lateral quad.     PT Treatment/Interventions  ADLs/Self Care Home Management;Cryotherapy;Electrical Stimulation;Gait training;Stair training;Functional mobility training;Therapeutic activities;Therapeutic exercise;Neuromuscular re-education;Patient/family education;Passive range of motion;Manual techniques;Vasopneumatic Device    PT Next Visit Plan  Complete FOTO. D/C to HEP.         Patient will  benefit from skilled therapeutic intervention in order to improve the following deficits and impairments:  Abnormal gait, Decreased activity tolerance, Decreased mobility, Decreased range of motion, Increased edema, Decreased strength, Pain  Visit Diagnosis: Chronic pain of left knee  Stiffness of left knee, not elsewhere classified     Problem List Patient Active Problem List   Diagnosis Date Noted  . S/P TKR (total knee replacement), left 08/24/2017  . Status post total knee replacement, right 03/02/2017  . Chronic intractable headache 08/09/2016  . Other specified hypothyroidism 03/02/2016  . A-fib ([place]) 12/24/2015  . Non-ischemic cardiomyopathy ([place]) 11/07/2015  . Long term (current) use of anticoagulants 11/07/2015  . Diaphoresis 11/01/2015  . Chest pain 11/01/2015  . PAF (paroxysmal atrial fibrillation) ([place]) 07/15/2015  . Heart palpitations 07/11/2015  . Dyspnea 02/18/2015  . Anemia, iron deficiency 09/10/2013  . Diaphragmatic hernia without mention of obstruction or gangrene   . Hypothyroidism   . Malaise and fatigue   . Arteritis, unspecified ([place])   . Iron deficiency anemia, unspecified   . Obesity   . BPH (benign prostatic hyperplasia)   . Essential hypertension, benign   . Hyperlipidemia     Madelyn Flavors PT 11/26/2017, 11:31 AM  [facility] [address] [place], [place], [address] Phone: [phone]   Fax:  [phone]  Name: Troy Acosta MRN: [id number] Date of Birth: Dec 04, 1950

## 2017-11-28 ENCOUNTER — Ambulatory Visit: Payer: BLUE CROSS/BLUE SHIELD | Admitting: Family Medicine

## 2017-11-28 ENCOUNTER — Ambulatory Visit (INDEPENDENT_AMBULATORY_CARE_PROVIDER_SITE_OTHER): Payer: BLUE CROSS/BLUE SHIELD

## 2017-11-28 ENCOUNTER — Encounter: Payer: Self-pay | Admitting: Family Medicine

## 2017-11-28 VITALS — BP 138/82 | HR 75 | Temp 97.7°F | Ht 71.0 in | Wt 255.0 lb

## 2017-11-28 DIAGNOSIS — R059 Cough, unspecified: Secondary | ICD-10-CM

## 2017-11-28 DIAGNOSIS — E034 Atrophy of thyroid (acquired): Secondary | ICD-10-CM

## 2017-11-28 DIAGNOSIS — R05 Cough: Secondary | ICD-10-CM

## 2017-11-28 DIAGNOSIS — R5383 Other fatigue: Secondary | ICD-10-CM | POA: Diagnosis not present

## 2017-11-28 LAB — GLUCOSE HEMOCUE WAIVED: GLU HEMOCUE WAIVED: 98 mg/dL (ref 65–99)

## 2017-11-28 LAB — BAYER DCA HB A1C WAIVED: HB A1C (BAYER DCA - WAIVED): 5.6 % (ref <7.0)

## 2017-11-28 NOTE — Patient Instructions (Signed)
Continue with humidification and nasal saline Continue with Mucinex as needed We will arrange for you to see an endocrinologist regarding your thyroid levels It may be that we have to go with a brand name thyroid medication instead of different generics Continue to take the current dose of thyroid medicine and continue to take it on an empty stomach Drink plenty of fluids and stay well-hydrated

## 2017-11-28 NOTE — Progress Notes (Signed)
Subjective:    Patient ID: Troy Acosta, male    DOB: August 08, 1951, 67 y.o.   MRN: [id number]  HPI Patient here today for fatigue and thyroid issues.  Patient is currently taking 175 mcg of thyroid every day.  He still has these fatigue issues and he cannot seem to explain.  He is checked his oxygen level and heart rates and these are always good when he has fatigue.  He still has a nighttime cough but is better from a recent chest cold but we will get a chest x-ray today.  He saw the cardiologist early in December and was given a good report by him along with an EKG and told to come back again in 1 year.  He denies any chest pain or shortness of breath other than this cough and congestion he has had recently.  He denies any trouble with his stomach including nausea vomiting diarrhea or blood in the stool.  He has chronic iron deficiency and his stools are dark in color.  He is passing his water without problems.  He seems to have the complaint that every time he has some type of surgery he has issues with his thyroid after that.  He does bring some lab work and from his workplace and the TSH from the workplace was 8.88 and this was done on 6 February subsequently on the last visit here about 1 week ago on 13 February the TSH was completely within normal limits.  I am not sure exactly what to believe.  He does say that his thyroid medicine is taken on an empty stomach every morning.  Looking back over his past record he has had a few blood sugars that were elevated and I will just get a stat blood sugar today along with an A1c.  A recheck of his blood pressure today was good in the right arm sitting large cuff at 138/82.    Patient Active Problem List   Diagnosis Date Noted  . S/P TKR (total knee replacement), left 08/24/2017  . Status post total knee replacement, right 03/02/2017  . Chronic intractable headache 08/09/2016  . Other specified hypothyroidism 03/02/2016  . A-fib ([place]) 12/24/2015  .  Non-ischemic cardiomyopathy ([place]) 11/07/2015  . Long term (current) use of anticoagulants 11/07/2015  . Diaphoresis 11/01/2015  . Chest pain 11/01/2015  . PAF (paroxysmal atrial fibrillation) ([place]) 07/15/2015  . Heart palpitations 07/11/2015  . Dyspnea 02/18/2015  . Anemia, iron deficiency 09/10/2013  . Diaphragmatic hernia without mention of obstruction or gangrene   . Hypothyroidism   . Malaise and fatigue   . Arteritis, unspecified ([place])   . Iron deficiency anemia, unspecified   . Obesity   . BPH (benign prostatic hyperplasia)   . Essential hypertension, benign   . Hyperlipidemia    Outpatient Encounter Medications as of 11/28/2017  Medication Sig  . amLODipine (NORVASC) 5 MG tablet Take 1 tablet (5 mg total) by mouth daily.  Marland Kitchen aspirin (ASPIRIN CHILDRENS) 81 MG chewable tablet Chew 1 tablet (81 mg total) 2 (two) times daily by mouth. (Patient taking differently: Chew 81 mg by mouth daily. )  . Cholecalciferol 2000 units CAPS Take 2,000 Units by mouth every evening.  . hydrochlorothiazide (HYDRODIURIL) 25 MG tablet Take 1 tablet (25 mg total) by mouth daily.  . iron polysaccharides (FERREX 150) 150 MG capsule Take 1 capsule (150 mg total) by mouth daily.  Marland Kitchen levothyroxine (SYNTHROID, LEVOTHROID) 150 MCG tablet TAKE 1 TABLET BY MOUTH  DAILY BEFORE  BREAKFAST (Patient taking differently: Take 150 mcg by mouth daily before breakfast. TAKE 1 TABLET BY MOUTH  DAILY BEFORE BREAKFAST)  . levothyroxine (SYNTHROID, LEVOTHROID) 25 MCG tablet Take 25 mcg by mouth daily before breakfast. Take with the 150 mg to = 175 mcg everyday  . olmesartan (BENICAR) 40 MG tablet Take 0.5 tablets (20 mg total) by mouth 2 (two) times daily.  . rosuvastatin (CRESTOR) 5 MG tablet Take 1 tablet (5 mg total) by mouth daily.  . [DISCONTINUED] methocarbamol (ROBAXIN) 500 MG tablet Take 1 tablet (500 mg total) 3 (three) times daily as needed by mouth. (Patient taking differently: Take 500 mg by mouth daily. )  .  [DISCONTINUED] oxyCODONE-acetaminophen (ROXICET) 5-325 MG tablet Take 1-2 tablets every 4 (four) hours as needed by mouth for severe pain. (Patient not taking: Reported on 11/26/2017)   Facility-Administered Encounter Medications as of 11/28/2017  Medication  . gadopentetate dimeglumine (MAGNEVIST) injection 20 mL      Review of Systems  Constitutional: Positive for fatigue.  HENT: Negative.   Eyes: Negative.   Respiratory: Negative.   Cardiovascular: Negative.   Gastrointestinal: Negative.   Endocrine: Negative.   Genitourinary: Negative.   Musculoskeletal: Negative.   Skin: Negative.   Allergic/Immunologic: Negative.   Neurological: Positive for weakness.  Hematological: Negative.   Psychiatric/Behavioral: Negative.        Objective:   Physical Exam  Constitutional: He is oriented to person, place, and time. He appears well-developed and well-nourished. No distress.  HENT:  Head: Normocephalic and atraumatic.  Right Ear: External ear normal.  Left Ear: External ear normal.  Nose: Nose normal.  Mouth/Throat: Oropharynx is clear and moist. No oropharyngeal exudate.  Eyes: Conjunctivae and EOM are normal. Pupils are equal, round, and reactive to light. Right eye exhibits no discharge. Left eye exhibits no discharge. No scleral icterus.  Neck: Normal range of motion. Neck supple. No thyromegaly present.  Cardiovascular: Normal rate, regular rhythm and normal heart sounds.  No murmur heard. Heart is regular at 72/min  Pulmonary/Chest: Effort normal and breath sounds normal. No respiratory distress. He has no wheezes. He has no rales.  Dry cough  Abdominal: Soft. Bowel sounds are normal. He exhibits no mass. There is no tenderness. There is no rebound and no guarding.  Musculoskeletal: Normal range of motion. He exhibits no edema.  Lymphadenopathy:    He has no cervical adenopathy.  Neurological: He is alert and oriented to person, place, and time. He has normal reflexes. No  cranial nerve deficit.  Skin: Skin is warm and dry. No rash noted.  Psychiatric: He has a normal mood and affect. His behavior is normal. Judgment and thought content normal.  Nursing note and vitals reviewed.  Chest x-ray with results pending==== BP (!) 149/89 (BP Location: Left Arm)   Pulse 75   Temp 97.7 F (36.5 C) (Oral)   Ht 5\' 11"  (1.803 m)   Wt 255 lb (115.7 kg)   BMI 35.57 kg/m        Assessment & Plan:  1. Hypothyroidism due to acquired atrophy of thyroid -Fluctuating thyroid levels -Episodes of fatigue - Thyroid Panel With TSH  2. Fatigue, unspecified type - Glucose Hemocue Waived - Vitamin B12 - Thyroid Panel With TSH - Bayer DCA Hb A1c Waived  3. Cough - DG Chest 2 View; Future  Patient Instructions  Continue with humidification and nasal saline Continue with Mucinex as needed We will arrange for you to see an endocrinologist regarding your thyroid levels It  may be that we have to go with a brand name thyroid medication instead of different generics Continue to take the current dose of thyroid medicine and continue to take it on an empty stomach Drink plenty of fluids and stay well-hydrated  Arrie Senate MD

## 2017-11-29 ENCOUNTER — Ambulatory Visit: Payer: BLUE CROSS/BLUE SHIELD | Admitting: Physical Therapy

## 2017-11-29 ENCOUNTER — Encounter: Payer: Self-pay | Admitting: Physical Therapy

## 2017-11-29 DIAGNOSIS — G8929 Other chronic pain: Secondary | ICD-10-CM

## 2017-11-29 DIAGNOSIS — M25562 Pain in left knee: Secondary | ICD-10-CM | POA: Diagnosis not present

## 2017-11-29 DIAGNOSIS — M25662 Stiffness of left knee, not elsewhere classified: Secondary | ICD-10-CM

## 2017-11-29 DIAGNOSIS — R600 Localized edema: Secondary | ICD-10-CM

## 2017-11-29 LAB — THYROID PANEL WITH TSH
Free Thyroxine Index: 2.1 (ref 1.2–4.9)
T3 UPTAKE RATIO: 25 % (ref 24–39)
T4, Total: 8.2 ug/dL (ref 4.5–12.0)
TSH: 4.8 u[IU]/mL — AB (ref 0.450–4.500)

## 2017-11-29 LAB — VITAMIN B12: VITAMIN B 12: 272 pg/mL (ref 232–1245)

## 2017-11-29 NOTE — Therapy (Signed)
[facility] [place], [place], [address] Phone: [phone]   Fax:  [phone]  Physical Therapy Treatment  Patient Details  Name: Troy Acosta MRN: [id number] Date of Birth: 1951/05/30 Referring Provider: Esmond Plants MD   Encounter Date: 11/29/2017  PT End of Session - 11/29/17 1042    Visit Number  30    Number of Visits  30    Date for PT Re-Evaluation  12/12/17    PT Start Time  1032    PT Stop Time  1125    PT Time Calculation (min)  53 min    Activity Tolerance  Patient tolerated treatment well    Behavior During Therapy  [facility] for tasks assessed/performed       Past Medical History:  Diagnosis Date  . Anxiety   . Arthritis    "all my joints" (08/24/2017)  . Diaphragmatic hernia without mention of obstruction or gangrene   . Dysrhythmia    afib, ablation- 12/2015, no problems since, off blood thinner- 09/2016  . Essential hypertension, benign   . Headache    had MRI- was consulted with Dr. Jannifer Franklin,, headache has resolved"probably related to caffeine"  . Hyperplasia of prostate   . Iron deficiency anemia, unspecified   . Malaise and fatigue   . Obesity   . Other and unspecified hyperlipidemia   . Paroxysmal atrial fibrillation (HCC)   . PONV (postoperative nausea and vomiting)   . Unspecified hypothyroidism   . Wears dentures     Past Surgical History:  Procedure Laterality Date  . COLONOSCOPY WITH ESOPHAGOGASTRODUODENOSCOPY (EGD)    . ELECTROPHYSIOLOGIC STUDY N/A 12/24/2015   Afib ablation by Dr Rayann Heman  . JOINT REPLACEMENT    . KNEE ARTHROSCOPY Right ~ 2016  . LUNG BIOPSY  1990s   "no problems identified"  . MULTIPLE TOOTH EXTRACTIONS    . TOTAL KNEE ARTHROPLASTY Right 03/02/2017   Procedure: RIGHT TOTAL KNEE ARTHROPLASTY;  Surgeon: [name], MD;  Location: [facility];  Service: Orthopedics;  Laterality: Right;  . TOTAL KNEE ARTHROPLASTY Left 08/24/2017  . TOTAL KNEE ARTHROPLASTY Left 08/24/2017    Procedure: LEFT TOTAL KNEE ARTHROPLASTY;  Surgeon: [name], MD;  Location: [facility];  Service: Orthopedics;  Laterality: Left;    There were no vitals filed for this visit.  Subjective Assessment - 11/29/17 1041    Subjective  Reports that his knee still hurts and is tight like normal.    Pertinent History  Right total knee replacement/ Left manipulaton    Limitations  Walking;Standing    How long can you sit comfortably?  2 hours    How long can you stand comfortably?  2 hours    How long can you walk comfortably?  Short community distances.  He is not driving yet.    Diagnostic tests  X-ray     Patient Stated Goals  2hrs    Currently in Pain?  Yes    Pain Score  5     Pain Location  Knee    Pain Orientation  Left    Pain Descriptors / Indicators  Discomfort    Pain Type  Surgical pain    Pain Onset  More than a month ago         [facility] PT Assessment - 11/29/17 0001      Assessment   Medical Diagnosis  left total knee replacement    Onset Date/Surgical Date  08/24/17    Hand Dominance  Right  Next MD Visit  12/11/2017      Restrictions   Weight Bearing Restrictions  No      ROM / Strength   AROM / PROM / Strength  AROM      AROM   Overall AROM   Deficits    AROM Assessment Site  Knee    Right/Left Knee  Left    Left Knee Extension  -6    Left Knee Flexion  111                  [facility] Adult PT Treatment/Exercise - 11/29/17 0001      Knee/Hip Exercises: Aerobic   Stationary Bike  Seat 7 x13 min      Knee/Hip Exercises: Machines for Strengthening   Cybex Knee Extension  20# 4x10 reps    Cybex Knee Flexion  40# 4x10 reps      Modalities   Modalities  Psychologist, educational Location  L knee    Electrical Stimulation Action  IFC    Electrical Stimulation Parameters  80-150 hz x15 min    Electrical Stimulation Goals  Pain      Vasopneumatic   Number Minutes Vasopneumatic   15  minutes    Vasopnuematic Location   Knee    Vasopneumatic Pressure  Medium    Vasopneumatic Temperature   34      Manual Therapy   Manual Therapy  Passive ROM    Passive ROM  PROM of L knee into flex, ext with holds atend range for ROM               PT Short Term Goals - 09/12/17 1243      PT SHORT TERM GOAL #1   Title  STG's=LTG's.        PT Long Term Goals - 11/29/17 1149      PT LONG TERM GOAL #1   Title  Independent with a HEP.    Period  Weeks    Status  Achieved      PT LONG TERM GOAL #2   Title  Full active left knee extension in order to normalize gait.    Status  Not Met -6 deg from neutral 11/29/2017      PT LONG TERM GOAL #3   Title  Active left knee flexion to 115 degrees+ so the patient can perform functional tasks and do so with pain not > 2-3/10.    Status  Partially Met 111 deg flexion 11/29/2017      PT LONG TERM GOAL #4   Title  Increase left knee strength to a solid 5/5 to provide good stability for accomplishment of functional activities.    Status  Achieved      PT LONG TERM GOAL #5   Title  Perform a reciprocating stair gait with one railing.    Status  Achieved      PT LONG TERM GOAL #6   Title  Pt will be albe to improve R knee pain </= 3/10 with amb and ADL's.     Status  Not Met 5/10 pain with ADLs 11/29/2017            Plan - 11/29/17 1150    Clinical Impression Statement  Patient tolerated today's treatment and has progressed slowly in regards to L knee ROM. Patient still reporting pain with ambulation and with activities thus not meeting LTG regarding pain. Patient reports diligence with  actvities and exercises at home. AROM measured at D/C as 6-111 deg. Normal modalities response noted following removal of the modalities.    Rehab Potential  Excellent    PT Frequency  3x / week    PT Duration  6 weeks    PT Treatment/Interventions  ADLs/Self Care Home Management;Cryotherapy;Electrical Stimulation;Gait training;Stair  training;Functional mobility training;Therapeutic activities;Therapeutic exercise;Neuromuscular re-education;Patient/family education;Passive range of motion;Manual techniques;Vasopneumatic Device    PT Next Visit Plan  D/C summary required.    PT Home Exercise Plan  Quad sets, heel slides in supine and sitting.    Consulted and Agree with Plan of Care  Patient       Patient will benefit from skilled therapeutic intervention in order to improve the following deficits and impairments:  Abnormal gait, Decreased activity tolerance, Decreased mobility, Decreased range of motion, Increased edema, Decreased strength, Pain  Visit Diagnosis: Chronic pain of left knee  Stiffness of left knee, not elsewhere classified  Localized edema     Problem List Patient Active Problem List   Diagnosis Date Noted  . S/P TKR (total knee replacement), left 08/24/2017  . Status post total knee replacement, right 03/02/2017  . Chronic intractable headache 08/09/2016  . Other specified hypothyroidism 03/02/2016  . A-fib ([place]) 12/24/2015  . Non-ischemic cardiomyopathy ([place]) 11/07/2015  . Long term (current) use of anticoagulants 11/07/2015  . Diaphoresis 11/01/2015  . Chest pain 11/01/2015  . PAF (paroxysmal atrial fibrillation) ([place]) 07/15/2015  . Heart palpitations 07/11/2015  . Dyspnea 02/18/2015  . Anemia, iron deficiency 09/10/2013  . Diaphragmatic hernia without mention of obstruction or gangrene   . Hypothyroidism   . Malaise and fatigue   . Arteritis, unspecified ([place])   . Iron deficiency anemia, unspecified   . Obesity   . BPH (benign prostatic hyperplasia)   . Essential hypertension, benign   . Hyperlipidemia     Standley Brooking, PTA 11/29/17 12:03 PM   [facility] [address] [place], [place], [address] Phone: [phone]   Fax:  [phone]  Name: Troy Acosta MRN: [id number] Date of Birth: 1951-05-12 PHYSICAL THERAPY DISCHARGE  SUMMARY  Visits from Start of Care: 30.  Current functional level related to goals / functional outcomes: See above.   Remaining deficits: Continued pain and loss of left knee ROM.   Education / Equipment: HEP. Plan: Patient agrees to discharge.  Patient goals were partially met. Patient is being discharged due to the physician's request.  ?????          Mali Applegate MPT

## 2017-12-05 ENCOUNTER — Telehealth: Payer: Self-pay | Admitting: Family Medicine

## 2017-12-05 DIAGNOSIS — E039 Hypothyroidism, unspecified: Secondary | ICD-10-CM

## 2017-12-05 MED ORDER — SYNTHROID 25 MCG PO TABS: ORAL_TABLET | ORAL | 1 refills | Status: DC

## 2017-12-05 MED ORDER — SYNTHROID 175 MCG PO TABS: 175.0000 ug | ORAL_TABLET | Freq: Every day | ORAL | 1 refills | Status: DC

## 2017-12-05 NOTE — Telephone Encounter (Signed)
Spoke with pt about labs and thyroid. He did get a call from DR Nicoletta Dress and he can not see pt until 02/05/18. He wants Dr. Laurance Flatten to address the thyroid in the mean time. He feels bad and he is willing to try a different strength and / or name brand.

## 2017-12-05 NOTE — Telephone Encounter (Signed)
Called pt - he is currently taking a total of 175 mcg everyday.

## 2017-12-05 NOTE — Telephone Encounter (Signed)
Please confirm current medication dose++++ we will need to increase this slightly and we should go with a brand name medicine like Synthroid.  Let me know exactly what he is taking now and will call this new prescription in for him and recommend repeating the thyroid profile in 4 weeks.

## 2017-12-05 NOTE — Telephone Encounter (Signed)
meds sent in and pt aware and clear on directions

## 2017-12-05 NOTE — Telephone Encounter (Signed)
Switch to brand name Synthroid 175 mcg daily and add Synthroid 12.5 mg 1 daily as directed.  As directed in this situation would be 1 of the 12.5 mcg on Monday Wednesday and Friday and take the 175 mcg daily every day of the week.  Recheck thyroid profile in 4 weeks and forward copy to Dr. Nicoletta Dress at that time along with the direction changed today so that he is aware of this when he sees the patient.

## 2017-12-12 DIAGNOSIS — D509 Iron deficiency anemia, unspecified: Secondary | ICD-10-CM | POA: Diagnosis not present

## 2017-12-12 DIAGNOSIS — Z719 Counseling, unspecified: Secondary | ICD-10-CM | POA: Diagnosis not present

## 2017-12-12 DIAGNOSIS — E782 Mixed hyperlipidemia: Secondary | ICD-10-CM | POA: Diagnosis not present

## 2017-12-12 DIAGNOSIS — Z008 Encounter for other general examination: Secondary | ICD-10-CM | POA: Diagnosis not present

## 2017-12-12 DIAGNOSIS — E039 Hypothyroidism, unspecified: Secondary | ICD-10-CM | POA: Diagnosis not present

## 2018-01-09 DIAGNOSIS — E039 Hypothyroidism, unspecified: Secondary | ICD-10-CM | POA: Diagnosis not present

## 2018-01-10 DIAGNOSIS — M79672 Pain in left foot: Secondary | ICD-10-CM | POA: Diagnosis not present

## 2018-01-10 DIAGNOSIS — M779 Enthesopathy, unspecified: Secondary | ICD-10-CM | POA: Diagnosis not present

## 2018-02-12 DIAGNOSIS — M1712 Unilateral primary osteoarthritis, left knee: Secondary | ICD-10-CM | POA: Diagnosis not present

## 2018-02-25 DIAGNOSIS — I10 Essential (primary) hypertension: Secondary | ICD-10-CM | POA: Diagnosis not present

## 2018-04-03 ENCOUNTER — Telehealth: Payer: Self-pay | Admitting: Family Medicine

## 2018-04-03 NOTE — Telephone Encounter (Signed)
Pt aware of appt made

## 2018-04-05 ENCOUNTER — Ambulatory Visit: Payer: BLUE CROSS/BLUE SHIELD | Admitting: Family Medicine

## 2018-04-05 ENCOUNTER — Encounter: Payer: Self-pay | Admitting: Family Medicine

## 2018-04-05 VITALS — BP 127/78 | HR 69 | Temp 97.9°F | Ht 71.0 in | Wt 256.0 lb

## 2018-04-05 DIAGNOSIS — I428 Other cardiomyopathies: Secondary | ICD-10-CM | POA: Diagnosis not present

## 2018-04-05 DIAGNOSIS — E7849 Other hyperlipidemia: Secondary | ICD-10-CM

## 2018-04-05 DIAGNOSIS — E034 Atrophy of thyroid (acquired): Secondary | ICD-10-CM

## 2018-04-05 DIAGNOSIS — E538 Deficiency of other specified B group vitamins: Secondary | ICD-10-CM

## 2018-04-05 DIAGNOSIS — I10 Essential (primary) hypertension: Secondary | ICD-10-CM

## 2018-04-05 DIAGNOSIS — R6889 Other general symptoms and signs: Secondary | ICD-10-CM | POA: Diagnosis not present

## 2018-04-05 DIAGNOSIS — I480 Paroxysmal atrial fibrillation: Secondary | ICD-10-CM | POA: Diagnosis not present

## 2018-04-05 DIAGNOSIS — R5383 Other fatigue: Secondary | ICD-10-CM

## 2018-04-05 DIAGNOSIS — E559 Vitamin D deficiency, unspecified: Secondary | ICD-10-CM

## 2018-04-05 DIAGNOSIS — D508 Other iron deficiency anemias: Secondary | ICD-10-CM

## 2018-04-05 NOTE — Patient Instructions (Addendum)
Continue current medications. Continue good therapeutic lifestyle changes which include good diet and exercise. Fall precautions discussed with patient. If an FOBT was given today- please return it to our front desk. If you are over 65 years old - you may need Prevnar 35 or the adult Pneumonia vaccine.  **Flu shots are available--- please call and schedule a FLU-CLINIC appointment**  After your visit with [place] today you will receive a survey in the mail or online from [employer] regarding your care with [place]. Please take a moment to fill this out. Your feedback is very important to [place] as you can help [place] better understand your patient needs as well as improve your experience and satisfaction. WE CARE ABOUT YOU!!!   Please make all efforts to lose weight through diet and exercise Reduce the carbs in your diet Drink more water Avoid sweets and sugar is much as possible Eat more fresh fruits and more nuts We will arrange for you to see the endocrinologist to follow-up on your thyroid issues Keep follow-up appointment with cardiology

## 2018-04-05 NOTE — Progress Notes (Signed)
Subjective:    Patient ID: Troy Acosta, male    DOB: 05-14-1951, 67 y.o.   MRN: [id number]  HPI Pt here for follow up and management of chronic medical problems which includes hypothyroid, hypertension and hyperlipidemia. He is taking medication regularly.  The patient is doing well overall and does complain of increased fatigue and especially problems with working in the heat and sweating a lot.  He never got to go see the endocrinologist because of his ongoing problems with his thyroid and we will re-arrange this so that he can see one just to make sure that everything is being done correctly with his thyroid medication we will make this appointment for him.  Today we will also make sure we get a B12 level in addition to his regular blood work.  He did have some pulmonary function test at work which showed some decline in pulmonary function but has not smoked for over 20 years.  He does have a history of having a fungal type pneumonia several years ago but this cleared.  The patient denies any chest pain pressure tightness or palpitations.  He denies any trouble with swallowing heartburn indigestion nausea vomiting diarrhea blood in the stool or black tarry bowel movements.  He is passing his water without problems.  He continues to see the cardiologist, Dr. Rayann Heman yearly because of his past history of an ablation.  His vital signs today are stable.  His weight is 256 pounds and his BMI is 35.58.  Since he has 2 or more comorbid medical conditions this would put him in the morbid obese category.  We will remind him of this during the visit today.     Patient Active Problem List   Diagnosis Date Noted  . S/P TKR (total knee replacement), left 08/24/2017  . Status post total knee replacement, right 03/02/2017  . Chronic intractable headache 08/09/2016  . Other specified hypothyroidism 03/02/2016  . A-fib ([place]) 12/24/2015  . Non-ischemic cardiomyopathy ([place]) 11/07/2015  . Long term (current) use of  anticoagulants 11/07/2015  . Diaphoresis 11/01/2015  . Chest pain 11/01/2015  . PAF (paroxysmal atrial fibrillation) ([place]) 07/15/2015  . Heart palpitations 07/11/2015  . Dyspnea 02/18/2015  . Anemia, iron deficiency 09/10/2013  . Diaphragmatic hernia without mention of obstruction or gangrene   . Hypothyroidism   . Malaise and fatigue   . Arteritis, unspecified ([place])   . Iron deficiency anemia, unspecified   . Obesity   . BPH (benign prostatic hyperplasia)   . Essential hypertension, benign   . Hyperlipidemia    Outpatient Encounter Medications as of 04/05/2018  Medication Sig  . acetaminophen (TYLENOL) 500 MG tablet Take 500 mg by mouth 3 (three) times daily.  Marland Kitchen amLODipine (NORVASC) 5 MG tablet Take 1 tablet (5 mg total) by mouth daily.  Marland Kitchen aspirin EC 81 MG tablet Take 81 mg by mouth daily.  . Cholecalciferol 2000 units CAPS Take 2,000 Units by mouth every evening.  . hydrochlorothiazide (HYDRODIURIL) 25 MG tablet Take 1 tablet (25 mg total) by mouth daily.  . iron polysaccharides (FERREX 150) 150 MG capsule Take 1 capsule (150 mg total) by mouth daily.  Marland Kitchen olmesartan (BENICAR) 40 MG tablet Take 0.5 tablets (20 mg total) by mouth 2 (two) times daily.  . rosuvastatin (CRESTOR) 5 MG tablet Take 1 tablet (5 mg total) by mouth daily.  Marland Kitchen SYNTHROID 175 MCG tablet Take 1 tablet (175 mcg total) by mouth daily before breakfast.  . SYNTHROID 25 MCG tablet Take  one tab po qd as directed  . [DISCONTINUED] aspirin (ASPIRIN CHILDRENS) 81 MG chewable tablet Chew 1 tablet (81 mg total) 2 (two) times daily by mouth. (Patient taking differently: Chew 81 mg by mouth daily. )   Facility-Administered Encounter Medications as of 04/05/2018  Medication  . gadopentetate dimeglumine (MAGNEVIST) injection 20 mL     Review of Systems  Constitutional: Positive for fatigue.  HENT: Negative.   Eyes: Negative.   Respiratory: Negative.   Cardiovascular: Negative.   Gastrointestinal: Negative.   Endocrine:  Negative.   Genitourinary: Negative.   Musculoskeletal: Negative.   Skin: Negative.   Allergic/Immunologic: Negative.   Neurological: Negative.   Hematological: Negative.   Psychiatric/Behavioral: Negative.        Objective:   Physical Exam  Constitutional: He is oriented to person, place, and time. He appears well-developed and well-nourished.  The patient is pleasant and relaxed and tries to maintain good health care as much as possible  HENT:  Head: Normocephalic and atraumatic.  Right Ear: External ear normal.  Left Ear: External ear normal.  Nose: Nose normal.  Mouth/Throat: Oropharynx is clear and moist. No oropharyngeal exudate.  Eyes: Pupils are equal, round, and reactive to light. Conjunctivae and EOM are normal. Right eye exhibits no discharge. Left eye exhibits no discharge. No scleral icterus.  Neck: Normal range of motion. Neck supple. No thyromegaly present.  No bruits thyromegaly or anterior cervical adenopathy  Cardiovascular: Normal rate, regular rhythm, normal heart sounds and intact distal pulses.  No murmur heard. The heart is regular at 60/min  Pulmonary/Chest: Effort normal and breath sounds normal. No respiratory distress. He has no wheezes. He has no rales. He exhibits no tenderness.  Clear anteriorly and posteriorly and no axillary adenopathy  Abdominal: Soft. Bowel sounds are normal. He exhibits no mass. There is no tenderness. There is no rebound and no guarding.  Abdominal obesity without masses tenderness organ enlargement bruits or inguinal adenopathy  Musculoskeletal: Normal range of motion. He exhibits no edema.  Lymphadenopathy:    He has no cervical adenopathy.  Neurological: He is alert and oriented to person, place, and time. He has normal reflexes. No cranial nerve deficit.  Skin: Skin is warm and dry. No rash noted.  Psychiatric: He has a normal mood and affect. His behavior is normal. Judgment and thought content normal.  The patient's mood  affect and behavior are all normal  Nursing note and vitals reviewed.   BP 127/78 (BP Location: Right Arm)   Pulse 69   Temp 97.9 F (36.6 C) (Oral)   Ht _0  (1.803 m)   Wt 256 lb (116.1 kg)   SpO2 97%   BMI 35.70 kg/m        Assessment & Plan:  1. Hypothyroidism due to acquired atrophy of thyroid -The patient has had ongoing symptoms related to his thyroid gland and now he is having fatigue and increased sweating and heat intolerance.  We will get another opinion from the endocrinologist to make sure there were managing this condition appropriately. - Thyroid Panel With TSH - Ambulatory referral to Endocrinology  2. Essential hypertension -Pressure is good today and he will continue with current treatment - BMP8+EGFR - Hepatic function panel  3. Other hyperlipidemia -Continue with cholesterol management pending results of lab work - Lipid panel  4. Paroxysmal atrial fibrillation (HCC) -No history of any increase bouts of heart arrhythmia and patient is feeling well and keeps his appointment with cardiology once yearly  5. Iron  deficiency anemia secondary to inadequate dietary iron intake -This is been a long-term problem and we will continue with his current iron replacement pending results of his lab work - Anemia Profile B - Vitamin B12  6. Fatigue, unspecified type - Ambulatory referral to Endocrinology  7. Vitamin D deficiency - VITAMIN D 25 Hydroxy (Vit-D Deficiency, Fractures)  8. Low vitamin B12 level - Vitamin B12  9. Non-ischemic cardiomyopathy ([place]) -Follow-up with cardiology as planned  10. Morbid obesity ([place]) -Patient understands the importance of watching carbs in his diet getting proper exercise drinking more water and through diet efforts and exercise efforts at losing weight, this was discussed with him thoroughly.  Patient Instructions  Continue current medications. Continue good therapeutic lifestyle changes which include good diet and  exercise. Fall precautions discussed with patient. If an FOBT was given today- please return it to our front desk. If you are over 22 years old - you may need Prevnar 13 or the adult Pneumonia vaccine.  **Flu shots are available--- please call and schedule a FLU-CLINIC appointment**  After your visit with [place] today you will receive a survey in the mail or online from [employer] regarding your care with [place]. Please take a moment to fill this out. Your feedback is very important to [place] as you can help [place] better understand your patient needs as well as improve your experience and satisfaction. WE CARE ABOUT YOU!!!   Please make all efforts to lose weight through diet and exercise Reduce the carbs in your diet Drink more water Avoid sweets and sugar is much as possible Eat more fresh fruits and more nuts We will arrange for you to see the endocrinologist to follow-up on your thyroid issues Keep follow-up appointment with cardiology  Arrie Senate MD

## 2018-04-06 LAB — ANEMIA PROFILE B
Basophils Absolute: 0 10*3/uL (ref 0.0–0.2)
Basos: 0 %
EOS (ABSOLUTE): 0.1 10*3/uL (ref 0.0–0.4)
Eos: 1 %
FOLATE: 14.7 ng/mL (ref >3.0)
Ferritin: 229 ng/mL (ref 30–400)
Hematocrit: 42.2 % (ref 37.5–51.0)
Hemoglobin: 14.7 g/dL (ref 13.0–17.7)
IMMATURE GRANS (ABS): 0 10*3/uL (ref 0.0–0.1)
Immature Granulocytes: 0 %
Iron Saturation: 25 % (ref 15–55)
Iron: 76 ug/dL (ref 38–169)
LYMPHS ABS: 2.1 10*3/uL (ref 0.7–3.1)
Lymphs: 26 %
MCH: 29 pg (ref 26.6–33.0)
MCHC: 34.8 g/dL (ref 31.5–35.7)
MCV: 83 fL (ref 79–97)
MONOS ABS: 0.6 10*3/uL (ref 0.1–0.9)
Monocytes: 8 %
NEUTROS PCT: 65 %
Neutrophils Absolute: 5.2 10*3/uL (ref 1.4–7.0)
PLATELETS: 216 10*3/uL (ref 150–450)
RBC: 5.07 x10E6/uL (ref 4.14–5.80)
RDW: 14.2 % (ref 12.3–15.4)
Retic Ct Pct: 1.1 % (ref 0.6–2.6)
Total Iron Binding Capacity: 310 ug/dL (ref 250–450)
UIBC: 234 ug/dL (ref 111–343)
Vitamin B-12: 309 pg/mL (ref 232–1245)
WBC: 8.1 10*3/uL (ref 3.4–10.8)

## 2018-04-06 LAB — LIPID PANEL
CHOL/HDL RATIO: 3.7 ratio (ref 0.0–5.0)
Cholesterol, Total: 123 mg/dL (ref 100–199)
HDL: 33 mg/dL — AB (ref >39)
LDL CALC: 66 mg/dL (ref 0–99)
TRIGLYCERIDES: 118 mg/dL (ref 0–149)
VLDL CHOLESTEROL CAL: 24 mg/dL (ref 5–40)

## 2018-04-06 LAB — BMP8+EGFR
BUN/Creatinine Ratio: 19 (ref 10–24)
BUN: 21 mg/dL (ref 8–27)
CO2: 25 mmol/L (ref 20–29)
CREATININE: 1.09 mg/dL (ref 0.76–1.27)
Calcium: 9.6 mg/dL (ref 8.6–10.2)
Chloride: 98 mmol/L (ref 96–106)
GFR calc Af Amer: 82 mL/min/{1.73_m2} (ref >59)
GFR, EST NON AFRICAN AMERICAN: 71 mL/min/{1.73_m2} (ref >59)
Glucose: 93 mg/dL (ref 65–99)
Potassium: 4.1 mmol/L (ref 3.5–5.2)
Sodium: 140 mmol/L (ref 134–144)

## 2018-04-06 LAB — HEPATIC FUNCTION PANEL
ALBUMIN: 4.5 g/dL (ref 3.6–4.8)
ALT: 22 IU/L (ref 0–44)
AST: 18 IU/L (ref 0–40)
Alkaline Phosphatase: 78 IU/L (ref 39–117)
Bilirubin Total: 0.5 mg/dL (ref 0.0–1.2)
Bilirubin, Direct: 0.16 mg/dL (ref 0.00–0.40)
Total Protein: 7.3 g/dL (ref 6.0–8.5)

## 2018-04-06 LAB — THYROID PANEL WITH TSH
Free Thyroxine Index: 2.4 (ref 1.2–4.9)
T3 Uptake Ratio: 27 % (ref 24–39)
T4 TOTAL: 9 ug/dL (ref 4.5–12.0)
TSH: 2.07 u[IU]/mL (ref 0.450–4.500)

## 2018-04-06 LAB — VITAMIN D 25 HYDROXY (VIT D DEFICIENCY, FRACTURES): Vit D, 25-Hydroxy: 39.6 ng/mL (ref 30.0–100.0)

## 2018-04-09 ENCOUNTER — Other Ambulatory Visit: Payer: BLUE CROSS/BLUE SHIELD

## 2018-04-09 DIAGNOSIS — Z1211 Encounter for screening for malignant neoplasm of colon: Secondary | ICD-10-CM

## 2018-04-11 LAB — FECAL OCCULT BLOOD, IMMUNOCHEMICAL: FECAL OCCULT BLD: NEGATIVE

## 2018-04-16 DIAGNOSIS — E039 Hypothyroidism, unspecified: Secondary | ICD-10-CM | POA: Diagnosis not present

## 2018-04-16 DIAGNOSIS — Z6834 Body mass index (BMI) 34.0-34.9, adult: Secondary | ICD-10-CM | POA: Diagnosis not present

## 2018-04-16 DIAGNOSIS — Z8639 Personal history of other endocrine, nutritional and metabolic disease: Secondary | ICD-10-CM | POA: Diagnosis not present

## 2018-04-18 ENCOUNTER — Ambulatory Visit: Payer: BLUE CROSS/BLUE SHIELD | Admitting: Family Medicine

## 2018-04-18 ENCOUNTER — Encounter: Payer: Self-pay | Admitting: Family Medicine

## 2018-04-18 VITALS — BP 124/70 | HR 67 | Temp 97.1°F | Ht 71.0 in | Wt 253.2 lb

## 2018-04-18 DIAGNOSIS — M545 Low back pain, unspecified: Secondary | ICD-10-CM

## 2018-04-18 MED ORDER — METHYLPREDNISOLONE ACETATE 80 MG/ML IJ SUSP: 80.0000 mg | Freq: Once | INTRAMUSCULAR | Status: DC

## 2018-04-18 MED ORDER — METHYLPREDNISOLONE ACETATE 80 MG/ML IJ SUSP
80.0000 mg | Freq: Once | INTRAMUSCULAR | Status: AC
  Administered 2018-04-18: 80 mg via INTRAMUSCULAR

## 2018-04-18 NOTE — Progress Notes (Signed)
   HPI  Patient presents today with left-sided low back pain.  Patient states that he has had steadily worsening dull achy left-sided back pain in the same area for about 3 to 4 months.  This started after he had his knees replaced over the last year.  Patient denies any injury. Over the last 3 to 4 days is been much worse.  Today he went to mow his lawn on a riding mower and had extreme pain.  Patient describes left-sided low back pain without radiation.  No leg symptoms or radiation to the legs. He Tylenol 500 mg 3 times daily for his knee pain.  PMH: Smoking status noted ROS: Per HPI  Objective: BP 124/70   Pulse 67   Temp (!) 97.1 F (36.2 C) (Oral)   Ht 5\' 11"  (1.803 m)   Wt 253 lb 3.2 oz (114.9 kg)   BMI 35.31 kg/m  Gen: NAD, alert, cooperative with exam HEENT: NCAT CV: RRR, good S1/S2, no murmur Resp: CTABL, no wheezes, non-labored Ext: No edema, warm Neuro: Alert and oriented, No gross deficits MSK:  Mild TTp of Lateral L sided lumbar area over the PSIS  Assessment and plan:  #Low back pain without sciatica Patient has tried some Soma at home which she had leftover from previous prescription.  This helped somewhat. Given IM Depo-Medrol today to reduce inflammation Likely muscle spasm Discussed supportive care, return to clinic with any concerns  Previous XR showed OA changes in lumbar spine.    Laroy Apple, MD [facility] 04/18/2018, 3:44 PM

## 2018-04-18 NOTE — Patient Instructions (Signed)
Great to see you!  Come back if you have any concerns

## 2018-05-14 ENCOUNTER — Other Ambulatory Visit: Payer: Self-pay | Admitting: Family Medicine

## 2018-06-05 DIAGNOSIS — E782 Mixed hyperlipidemia: Secondary | ICD-10-CM | POA: Diagnosis not present

## 2018-06-05 DIAGNOSIS — Z139 Encounter for screening, unspecified: Secondary | ICD-10-CM | POA: Diagnosis not present

## 2018-06-05 DIAGNOSIS — Z0000 Encounter for general adult medical examination without abnormal findings: Secondary | ICD-10-CM | POA: Diagnosis not present

## 2018-06-05 DIAGNOSIS — R7301 Impaired fasting glucose: Secondary | ICD-10-CM | POA: Diagnosis not present

## 2018-06-05 DIAGNOSIS — Z79899 Other long term (current) drug therapy: Secondary | ICD-10-CM | POA: Diagnosis not present

## 2018-06-05 DIAGNOSIS — D509 Iron deficiency anemia, unspecified: Secondary | ICD-10-CM | POA: Diagnosis not present

## 2018-06-05 DIAGNOSIS — E039 Hypothyroidism, unspecified: Secondary | ICD-10-CM | POA: Diagnosis not present

## 2018-06-12 DIAGNOSIS — Z712 Person consulting for explanation of examination or test findings: Secondary | ICD-10-CM | POA: Diagnosis not present

## 2018-06-12 DIAGNOSIS — E039 Hypothyroidism, unspecified: Secondary | ICD-10-CM | POA: Diagnosis not present

## 2018-06-12 DIAGNOSIS — D509 Iron deficiency anemia, unspecified: Secondary | ICD-10-CM | POA: Diagnosis not present

## 2018-06-12 DIAGNOSIS — R7982 Elevated C-reactive protein (CRP): Secondary | ICD-10-CM | POA: Diagnosis not present

## 2018-06-17 DIAGNOSIS — R7982 Elevated C-reactive protein (CRP): Secondary | ICD-10-CM | POA: Diagnosis not present

## 2018-06-17 DIAGNOSIS — Z719 Counseling, unspecified: Secondary | ICD-10-CM | POA: Diagnosis not present

## 2018-06-17 DIAGNOSIS — D509 Iron deficiency anemia, unspecified: Secondary | ICD-10-CM | POA: Diagnosis not present

## 2018-06-17 DIAGNOSIS — E039 Hypothyroidism, unspecified: Secondary | ICD-10-CM | POA: Diagnosis not present

## 2018-06-26 DIAGNOSIS — I10 Essential (primary) hypertension: Secondary | ICD-10-CM | POA: Diagnosis not present

## 2018-07-02 DIAGNOSIS — Z23 Encounter for immunization: Secondary | ICD-10-CM | POA: Diagnosis not present

## 2018-07-11 ENCOUNTER — Ambulatory Visit: Payer: BLUE CROSS/BLUE SHIELD | Admitting: Family

## 2018-07-11 ENCOUNTER — Ambulatory Visit (INDEPENDENT_AMBULATORY_CARE_PROVIDER_SITE_OTHER): Payer: BLUE CROSS/BLUE SHIELD

## 2018-07-11 ENCOUNTER — Encounter: Payer: Self-pay | Admitting: Family

## 2018-07-11 VITALS — BP 136/84 | HR 64 | Temp 97.4°F | Ht 71.0 in | Wt 251.8 lb

## 2018-07-11 DIAGNOSIS — M545 Low back pain, unspecified: Secondary | ICD-10-CM

## 2018-07-11 DIAGNOSIS — G8929 Other chronic pain: Secondary | ICD-10-CM | POA: Diagnosis not present

## 2018-07-11 MED ORDER — BACLOFEN 10 MG PO TABS: 10.0000 mg | ORAL_TABLET | Freq: Three times a day (TID) | ORAL | 0 refills | Status: DC

## 2018-07-11 MED ORDER — KETOROLAC TROMETHAMINE 60 MG/2ML IM SOLN
60.0000 mg | Freq: Once | INTRAMUSCULAR | Status: AC
  Administered 2018-07-11: 60 mg via INTRAMUSCULAR

## 2018-07-11 MED ORDER — NAPROXEN 500 MG PO TABS: 500.0000 mg | ORAL_TABLET | Freq: Two times a day (BID) | ORAL | 1 refills | Status: DC

## 2018-07-11 MED ORDER — METHYLPREDNISOLONE ACETATE 80 MG/ML IJ SUSP
80.0000 mg | Freq: Once | INTRAMUSCULAR | Status: AC
  Administered 2018-07-11: 80 mg via INTRAMUSCULAR

## 2018-07-11 NOTE — Addendum Note (Signed)
Addended by: Evelina Dun A on: 07/11/2018 04:54 PM   Modules accepted: Level of Service

## 2018-07-11 NOTE — Progress Notes (Addendum)
   Subjective:    Patient ID: Troy Acosta, male    DOB: 17-Jul-1951, 67 y.o.   MRN: [id number]  Chief Complaint  Patient presents with  . Back Pain    Back Pain  This is a chronic problem. The current episode started more than 1 month ago. The problem occurs intermittently. The problem has been waxing and waning since onset. The pain is present in the lumbar spine. The quality of the pain is described as aching. The pain does not radiate. The pain is at a severity of 7/10. The pain is moderate. The symptoms are aggravated by sitting and standing. Pertinent negatives include no bladder incontinence, bowel incontinence, leg pain or weakness. He has tried bed rest, NSAIDs and walking for the symptoms. The treatment provided mild relief.      Review of Systems  Gastrointestinal: Negative for bowel incontinence.  Genitourinary: Negative for bladder incontinence.  Musculoskeletal: Positive for back pain.  Neurological: Negative for weakness.  All other systems reviewed and are negative.      Objective:   Physical Exam  Constitutional: He is oriented to person, place, and time. He appears well-developed and well-nourished. No distress.  HENT:  Head: Normocephalic.  Eyes: Pupils are equal, round, and reactive to light. Right eye exhibits no discharge. Left eye exhibits no discharge.  Neck: Normal range of motion. Neck supple. No thyromegaly present.  Cardiovascular: Normal rate, regular rhythm, normal heart sounds and intact distal pulses.  No murmur heard. Pulmonary/Chest: Effort normal and breath sounds normal. No respiratory distress. He has no wheezes.  Abdominal: Soft. Bowel sounds are normal. He exhibits no distension. There is no tenderness.  Musculoskeletal: He exhibits tenderness. He exhibits no edema.  Pain in lower lumbar with flexion and extension  Neurological: He is alert and oriented to person, place, and time. He has normal reflexes. No cranial nerve deficit.  Skin: Skin is  warm and dry. No rash noted. No erythema.  Psychiatric: He has a normal mood and affect. His behavior is normal. Judgment and thought content normal.  Vitals reviewed.  Lumbar x-ray- Arthritis, no acute findings, Preliminary reading by Evelina Dun, FNP [facility]   BP 136/84   Pulse 64   Temp (!) 97.4 F (36.3 C) (Oral)   Ht 5\' 11"  (1.803 m)   Wt 251 lb 12.8 oz (114.2 kg)   BMI 35.12 kg/m      Assessment & Plan:  Troy Acosta comes in today with chief complaint of Back Pain   Diagnosis and orders addressed:  1. Chronic low back pain without sciatica, unspecified back pain laterality Rest Ice ROM exercises No other NSAID's and sedation precautions discussed RTO if symptoms worsen or do not improve - DG Lumbar Spine 2-3 Views; Future - methylPREDNISolone acetate (DEPO-MEDROL) injection 80 mg - ketorolac (TORADOL) injection 60 mg - baclofen (LIORESAL) 10 MG tablet; Take 1 tablet (10 mg total) by mouth 3 (three) times daily.  Dispense: 30 each; Refill: 0 - naproxen (NAPROSYN) 500 MG tablet; Take 1 tablet (500 mg total) by mouth 2 (two) times daily with a meal.  Dispense: 60 tablet; Refill: [place], FNP

## 2018-07-11 NOTE — Patient Instructions (Signed)

## 2018-09-13 ENCOUNTER — Ambulatory Visit: Payer: BLUE CROSS/BLUE SHIELD | Admitting: Internal Medicine

## 2018-09-13 ENCOUNTER — Encounter: Payer: Self-pay | Admitting: Internal Medicine

## 2018-09-13 VITALS — BP 149/87 | HR 62 | Ht 71.0 in | Wt 259.2 lb

## 2018-09-13 DIAGNOSIS — E663 Overweight: Secondary | ICD-10-CM | POA: Diagnosis not present

## 2018-09-13 DIAGNOSIS — I10 Essential (primary) hypertension: Secondary | ICD-10-CM | POA: Diagnosis not present

## 2018-09-13 DIAGNOSIS — H40013 Open angle with borderline findings, low risk, bilateral: Secondary | ICD-10-CM | POA: Diagnosis not present

## 2018-09-13 DIAGNOSIS — I480 Paroxysmal atrial fibrillation: Secondary | ICD-10-CM

## 2018-09-13 DIAGNOSIS — H2513 Age-related nuclear cataract, bilateral: Secondary | ICD-10-CM | POA: Diagnosis not present

## 2018-09-13 DIAGNOSIS — H35033 Hypertensive retinopathy, bilateral: Secondary | ICD-10-CM | POA: Diagnosis not present

## 2018-09-13 DIAGNOSIS — H25013 Cortical age-related cataract, bilateral: Secondary | ICD-10-CM | POA: Diagnosis not present

## 2018-09-13 NOTE — Progress Notes (Signed)
PCP: Chipper Herb, MD Primary Cardiologist: Dr Debara Pickett Primary EP: Dr Rayann Heman  Troy Acosta is a 67 y.o. male who presents today for routine electrophysiology followup.  Since last being seen in our clinic, the patient reports doing very well.  Today, he denies symptoms of palpitations, chest pain, shortness of breath,  lower extremity edema, dizziness, presyncope, or syncope.  The patient is otherwise without complaint today.   Past Medical History:  Diagnosis Date  . Anxiety   . Arthritis    "all my joints" (08/24/2017)  . Diaphragmatic hernia without mention of obstruction or gangrene   . Dysrhythmia    afib, ablation- 12/2015, no problems since, off blood thinner- 09/2016  . Essential hypertension, benign   . Headache    had MRI- was consulted with Dr. Jannifer Franklin,, headache has resolved"probably related to caffeine"  . Hyperplasia of prostate   . Iron deficiency anemia, unspecified   . Malaise and fatigue   . Obesity   . Other and unspecified hyperlipidemia   . Paroxysmal atrial fibrillation (HCC)   . PONV (postoperative nausea and vomiting)   . Unspecified hypothyroidism   . Wears dentures    Past Surgical History:  Procedure Laterality Date  . COLONOSCOPY WITH ESOPHAGOGASTRODUODENOSCOPY (EGD)    . ELECTROPHYSIOLOGIC STUDY N/A 12/24/2015   Afib ablation by Dr Rayann Heman  . JOINT REPLACEMENT    . KNEE ARTHROSCOPY Right ~ 2016  . LUNG BIOPSY  1990s   "no problems identified"  . MULTIPLE TOOTH EXTRACTIONS    . TOTAL KNEE ARTHROPLASTY Right 03/02/2017   Procedure: RIGHT TOTAL KNEE ARTHROPLASTY;  Surgeon: [name], MD;  Location: [place];  Service: Orthopedics;  Laterality: Right;  . TOTAL KNEE ARTHROPLASTY Left 08/24/2017  . TOTAL KNEE ARTHROPLASTY Left 08/24/2017   Procedure: LEFT TOTAL KNEE ARTHROPLASTY;  Surgeon: [name], MD;  Location: [place];  Service: Orthopedics;  Laterality: Left;    ROS- all systems are reviewed and negatives except as per HPI above  Current  Outpatient Medications  Medication Sig Dispense Refill  . acetaminophen (TYLENOL) 500 MG tablet Take 500 mg by mouth 3 (three) times daily.    Marland Kitchen amLODipine (NORVASC) 5 MG tablet Take 1 tablet (5 mg total) by mouth daily. 90 tablet 3  . aspirin EC 81 MG tablet Take 81 mg by mouth daily.    . Cholecalciferol 2000 units CAPS Take 2,000 Units by mouth every evening.    . hydrochlorothiazide (HYDRODIURIL) 25 MG tablet Take 1 tablet (25 mg total) by mouth daily. 90 tablet 3  . iron polysaccharides (FERREX 150) 150 MG capsule Take 1 capsule (150 mg total) by mouth daily. 90 capsule 3  . olmesartan (BENICAR) 40 MG tablet Take 0.5 tablets (20 mg total) by mouth 2 (two) times daily. 180 tablet 3  . rosuvastatin (CRESTOR) 5 MG tablet Take 1 tablet (5 mg total) by mouth daily. 90 tablet 3  . SYNTHROID 175 MCG tablet TAKE 1 TABLET BY MOUTH  DAILY BEFORE BREAKFAST 90 tablet 3   No current facility-administered medications for this visit.    Facility-Administered Medications Ordered in Other Visits  Medication Dose Route Frequency Provider Last Rate Last Dose  . gadopentetate dimeglumine (MAGNEVIST) injection 20 mL  20 mL Intravenous Once PRN Kathrynn Ducking, MD        Physical Exam: Vitals:   09/13/18 0827  BP: (!) 149/87  Pulse: 62  Weight: 259 lb 3.2 oz (117.6 kg)  Height: 5\' 11"  (1.803 m)  GEN- The patient is well appearing, alert and oriented x 3 today.   Head- normocephalic, atraumatic Eyes-  Sclera clear, conjunctiva pink Ears- hearing intact Oropharynx- clear Lungs- Clear to ausculation bilaterally, normal work of breathing Heart- Regular rate and rhythm, no murmurs, rubs or gallops, PMI not laterally displaced GI- soft, NT, ND, + BS Extremities- no clubbing, cyanosis, or edema  Wt Readings from Last 3 Encounters:  09/13/18 259 lb 3.2 oz (117.6 kg)  07/11/18 251 lb 12.8 oz (114.2 kg)  04/18/18 253 lb 3.2 oz (114.9 kg)    EKG tracing ordered today is personally reviewed and  shows sinus rhythm  Assessment and Plan:  1. Paroxysmal atrial fibrillation Doing well post ablation off AAD therapy chads2vasc score is 2.  I have discussed AHA/HRs guidelines for  Anticoagulation at length today.  He is aware that guidelines would advise [place] therapy.  He is clear in his decision to decline.  2. HTN Elevated today I have advised increase in norvasc which he declines to do today.  3. Tachycardia mediated CM Ef normalized with sinus rhythm Echo 2017 reviewed, consider repeat echo in a year  4. Overweight Body mass index is 36.15 kg/m. He has gained substantial weight over the past year.  We discussed at length today.  Return in a year  [name] MD, [facility] 09/13/2018 9:09 AM

## 2018-09-13 NOTE — Patient Instructions (Signed)
Medication Instructions:  Continue all current medications.  Labwork: none  Testing/Procedures: none  Follow-Up: 1 year   Any Other Special Instructions Will Be Listed Below (If Applicable).  If you need a refill on your cardiac medications before your next appointment, please call your pharmacy.  

## 2018-09-14 ENCOUNTER — Ambulatory Visit: Payer: BLUE CROSS/BLUE SHIELD | Admitting: Family Medicine

## 2018-09-14 ENCOUNTER — Encounter: Payer: Self-pay | Admitting: Family Medicine

## 2018-09-14 ENCOUNTER — Other Ambulatory Visit: Payer: Self-pay | Admitting: Family Medicine

## 2018-09-14 VITALS — BP 139/85 | HR 61 | Temp 97.4°F | Ht 71.0 in | Wt 255.8 lb

## 2018-09-14 DIAGNOSIS — I119 Hypertensive heart disease without heart failure: Secondary | ICD-10-CM | POA: Diagnosis not present

## 2018-09-14 DIAGNOSIS — I10 Essential (primary) hypertension: Secondary | ICD-10-CM

## 2018-09-14 NOTE — Patient Instructions (Signed)
DASH Eating Plan DASH stands for "Dietary Approaches to Stop Hypertension." The DASH eating plan is a healthy eating plan that has been shown to reduce high blood pressure (hypertension). It may also reduce your risk for type 2 diabetes, heart disease, and stroke. The DASH eating plan may also help with weight loss. What are tips for following this plan? General guidelines  Avoid eating more than 2,300 mg (milligrams) of salt (sodium) a day. If you have hypertension, you may need to reduce your sodium intake to 1,500 mg a day.  Limit alcohol intake to no more than 1 drink a day for nonpregnant women and 2 drinks a day for men. One drink equals 12 oz of beer, 5 oz of wine, or 1 oz of hard liquor.  Work with your health care provider to maintain a healthy body weight or to lose weight. Ask what an ideal weight is for you.  Get at least 30 minutes of exercise that causes your heart to beat faster (aerobic exercise) most days of the week. Activities may include walking, swimming, or biking.  Work with your health care provider or diet and nutrition specialist (dietitian) to adjust your eating plan to your individual calorie needs. Reading food labels  Check food labels for the amount of sodium per serving. Choose foods with less than 5 percent of the Daily Value of sodium. Generally, foods with less than 300 mg of sodium per serving fit into this eating plan.  To find whole grains, look for the word "whole" as the first word in the ingredient list. Shopping  Buy products labeled as "low-sodium" or "no salt added."  Buy fresh foods. Avoid canned foods and premade or frozen meals. Cooking  Avoid adding salt when cooking. Use salt-free seasonings or herbs instead of table salt or sea salt. Check with your health care provider or pharmacist before using salt substitutes.  Do not fry foods. Cook foods using healthy methods such as baking, boiling, grilling, and broiling instead.  Cook with  heart-healthy oils, such as olive, canola, soybean, or sunflower oil. Meal planning   Eat a balanced diet that includes: ? 5 or more servings of fruits and vegetables each day. At each meal, try to fill half of your plate with fruits and vegetables. ? Up to 6-8 servings of whole grains each day. ? Less than 6 oz of lean meat, poultry, or fish each day. A 3-oz serving of meat is about the same size as a deck of cards. One egg equals 1 oz. ? 2 servings of low-fat dairy each day. ? A serving of nuts, seeds, or beans 5 times each week. ? Heart-healthy fats. Healthy fats called Omega-3 fatty acids are found in foods such as flaxseeds and coldwater fish, like sardines, salmon, and mackerel.  Limit how much you eat of the following: ? Canned or prepackaged foods. ? Food that is high in trans fat, such as fried foods. ? Food that is high in saturated fat, such as fatty meat. ? Sweets, desserts, sugary drinks, and other foods with added sugar. ? Full-fat dairy products.  Do not salt foods before eating.  Try to eat at least 2 vegetarian meals each week.  Eat more home-cooked food and less restaurant, buffet, and fast food.  When eating at a restaurant, ask that your food be prepared with less salt or no salt, if possible. What foods are recommended? The items listed may not be a complete list. Talk with your dietitian about what   dietary choices are best for you. Grains Whole-grain or whole-wheat bread. Whole-grain or whole-wheat pasta. Brown rice. Oatmeal. Quinoa. Bulgur. Whole-grain and low-sodium cereals. Pita bread. Low-fat, low-sodium crackers. Whole-wheat flour tortillas. Vegetables Fresh or frozen vegetables (raw, steamed, roasted, or grilled). Low-sodium or reduced-sodium tomato and vegetable juice. Low-sodium or reduced-sodium tomato sauce and tomato paste. Low-sodium or reduced-sodium canned vegetables. Fruits All fresh, dried, or frozen fruit. Canned fruit in natural juice (without  added sugar). Meat and other protein foods Skinless chicken or turkey. Ground chicken or turkey. Pork with fat trimmed off. Fish and seafood. Egg whites. Dried beans, peas, or lentils. Unsalted nuts, nut butters, and seeds. Unsalted canned beans. Lean cuts of beef with fat trimmed off. Low-sodium, lean deli meat. Dairy Low-fat (1%) or fat-free (skim) milk. Fat-free, low-fat, or reduced-fat cheeses. Nonfat, low-sodium ricotta or cottage cheese. Low-fat or nonfat yogurt. Low-fat, low-sodium cheese. Fats and oils Soft margarine without trans fats. Vegetable oil. Low-fat, reduced-fat, or light mayonnaise and salad dressings (reduced-sodium). Canola, safflower, olive, soybean, and sunflower oils. Avocado. Seasoning and other foods Herbs. Spices. Seasoning mixes without salt. Unsalted popcorn and pretzels. Fat-free sweets. What foods are not recommended? The items listed may not be a complete list. Talk with your dietitian about what dietary choices are best for you. Grains Baked goods made with fat, such as croissants, muffins, or some breads. Dry pasta or rice meal packs. Vegetables Creamed or fried vegetables. Vegetables in a cheese sauce. Regular canned vegetables (not low-sodium or reduced-sodium). Regular canned tomato sauce and paste (not low-sodium or reduced-sodium). Regular tomato and vegetable juice (not low-sodium or reduced-sodium). Pickles. Olives. Fruits Canned fruit in a light or heavy syrup. Fried fruit. Fruit in cream or butter sauce. Meat and other protein foods Fatty cuts of meat. Ribs. Fried meat. Bacon. Sausage. Bologna and other processed lunch meats. Salami. Fatback. Hotdogs. Bratwurst. Salted nuts and seeds. Canned beans with added salt. Canned or smoked fish. Whole eggs or egg yolks. Chicken or turkey with skin. Dairy Whole or 2% milk, cream, and half-and-half. Whole or full-fat cream cheese. Whole-fat or sweetened yogurt. Full-fat cheese. Nondairy creamers. Whipped toppings.  Processed cheese and cheese spreads. Fats and oils Butter. Stick margarine. Lard. Shortening. Ghee. Bacon fat. Tropical oils, such as coconut, palm kernel, or palm oil. Seasoning and other foods Salted popcorn and pretzels. Onion salt, garlic salt, seasoned salt, table salt, and sea salt. Worcestershire sauce. Tartar sauce. Barbecue sauce. Teriyaki sauce. Soy sauce, including reduced-sodium. Steak sauce. Canned and packaged gravies. Fish sauce. Oyster sauce. Cocktail sauce. Horseradish that you find on the shelf. Ketchup. Mustard. Meat flavorings and tenderizers. Bouillon cubes. Hot sauce and Tabasco sauce. Premade or packaged marinades. Premade or packaged taco seasonings. Relishes. Regular salad dressings. Where to find more information:  National Heart, Lung, and Blood Institute: www.nhlbi.nih.gov  American Heart Association: www.heart.org Summary  The DASH eating plan is a healthy eating plan that has been shown to reduce high blood pressure (hypertension). It may also reduce your risk for type 2 diabetes, heart disease, and stroke.  With the DASH eating plan, you should limit salt (sodium) intake to 2,300 mg a day. If you have hypertension, you may need to reduce your sodium intake to 1,500 mg a day.  When on the DASH eating plan, aim to eat more fresh fruits and vegetables, whole grains, lean proteins, low-fat dairy, and heart-healthy fats.  Work with your health care provider or diet and nutrition specialist (dietitian) to adjust your eating plan to your individual   calorie needs. This information is not intended to replace advice given to you by your health care provider. Make sure you discuss any questions you have with your health care provider. Document Released: 09/14/2011 Document Revised: 09/18/2016 Document Reviewed: 09/18/2016 Elsevier Interactive Patient Education  2018 Elsevier Inc.  

## 2018-09-14 NOTE — Progress Notes (Signed)
Subjective:  Patient ID: Troy Acosta, male    DOB: [birth year]  Age: 67 y.o. MRN: [id number]  CC: Hypertension   HPI Troy Acosta presents for concern for elevated blood pressure.  He has had it is high as 951 systolic at home recently.  He was advised by his cardiologist yesterday that he should lose weight and get his blood pressure down because it was about 884 systolic yesterday in that office.  This morning it was approximately the same so he decided he better be checked to avoid having a heart attack.  He is not having any chest pain or shortness of breath.  No palpitations.  No distress.  Depression screen [facility] 2/9 09/14/2018 07/11/2018 04/18/2018  Decreased Interest 0 0 0  Down, Depressed, Hopeless 0 0 0  PHQ - 2 Score 0 0 0    History Troy Acosta has a past medical history of Anxiety, Arthritis, Diaphragmatic hernia without mention of obstruction or gangrene, Dysrhythmia, Essential hypertension, benign, Headache, Hyperplasia of prostate, Iron deficiency anemia, unspecified, Malaise and fatigue, Obesity, Other and unspecified hyperlipidemia, Paroxysmal atrial fibrillation ([place]), PONV (postoperative nausea and vomiting), Unspecified hypothyroidism, and Wears dentures.   He has a past surgical history that includes Cardiac catheterization (N/A, 12/24/2015); Total knee arthroplasty (Right, 03/02/2017); Multiple tooth extractions; Colonoscopy with esophagogastroduodenoscopy (egd); Total knee arthroplasty (Left, 08/24/2017); Joint replacement; Knee arthroscopy (Right, ~ 2016); Lung biopsy (1990s); and Total knee arthroplasty (Left, 08/24/2017).   His family history includes COPD in his father; Cancer in his brother; Crohn's disease in his sister; Diabetes in his mother; Heart disease in his sister; Heart failure in his mother; Hypertension in his mother and sister; Lung cancer in his father and paternal grandfather; Thyroid disease in his sister; Tuberous sclerosis in his father.He reports that he quit smoking  about 25 years ago. His smoking use included cigars. He quit after 22.00 years of use. He has never used smokeless tobacco. He reports that he does not drink alcohol or use drugs.    ROS Review of Systems  Constitutional: Negative for fever.  Respiratory: Negative for shortness of breath.   Cardiovascular: Negative for chest pain.  Musculoskeletal: Negative for arthralgias.  Skin: Negative for rash.    Objective:  BP 139/85   Pulse 61   Temp (!) 97.4 F (36.3 C) (Oral)   Ht 5\' 11"  (1.803 m)   Wt 255 lb 12.8 oz (116 kg)   BMI 35.68 kg/m   BP Readings from Last 3 Encounters:  09/14/18 139/85  09/13/18 (!) 149/87  07/11/18 136/84    Wt Readings from Last 3 Encounters:  09/14/18 255 lb 12.8 oz (116 kg)  09/13/18 259 lb 3.2 oz (117.6 kg)  07/11/18 251 lb 12.8 oz (114.2 kg)     Physical Exam  Constitutional: He appears well-developed and well-nourished.  HENT:  Head: Normocephalic and atraumatic.  Right Ear: Tympanic membrane and external ear normal. No decreased hearing is noted.  Left Ear: Tympanic membrane and external ear normal. No decreased hearing is noted.  Mouth/Throat: No oropharyngeal exudate or posterior oropharyngeal erythema.  Eyes: Pupils are equal, round, and reactive to light.  Neck: Normal range of motion. Neck supple.  Cardiovascular: Normal rate and regular rhythm.  No murmur heard. Pulmonary/Chest: Breath sounds normal. No respiratory distress.  Abdominal: Soft. Bowel sounds are normal. He exhibits no mass. There is no tenderness.  Vitals reviewed.     Assessment & Plan:   Troy Acosta was seen today for hypertension.  Diagnoses and all  orders for this visit:  Hypertensive heart disease without CHF    I reassured the patient today that he was in no imminent danger.  However I would like to see a trend of his blood pressure coming down into the 161W systolic and 96E diastolic.  To accomplish this we discussed strategies for weight loss and salt  avoidance.  He was given a printout of the DASH eating program   I am having Troy Acosta maintain his Cholecalciferol, iron polysaccharides, amLODipine, rosuvastatin, olmesartan, hydrochlorothiazide, aspirin EC, acetaminophen, and SYNTHROID.  Allergies as of 09/14/2018   No Known Allergies     Medication List        Accurate as of 09/14/18 12:51 PM. Always use your most recent med list.          acetaminophen 500 MG tablet Commonly known as:  TYLENOL Take 500 mg by mouth 3 (three) times daily.   amLODipine 5 MG tablet Commonly known as:  NORVASC Take 1 tablet (5 mg total) by mouth daily.   aspirin EC 81 MG tablet Take 81 mg by mouth daily.   Cholecalciferol 50 MCG (2000 UT) Caps Take 2,000 Units by mouth every evening.   hydrochlorothiazide 25 MG tablet Commonly known as:  HYDRODIURIL Take 1 tablet (25 mg total) by mouth daily.   iron polysaccharides 150 MG capsule Commonly known as:  NIFEREX Take 1 capsule (150 mg total) by mouth daily.   olmesartan 40 MG tablet Commonly known as:  BENICAR Take 0.5 tablets (20 mg total) by mouth 2 (two) times daily.   rosuvastatin 5 MG tablet Commonly known as:  CRESTOR Take 1 tablet (5 mg total) by mouth daily.   SYNTHROID 175 MCG tablet Generic drug:  levothyroxine TAKE 1 TABLET BY MOUTH  DAILY BEFORE BREAKFAST        Follow-up: Return in about 1 month (around 10/15/2018) for hypertension with Dr. Laurance Flatten.  Claretta Fraise, M.D.

## 2018-09-16 DIAGNOSIS — D509 Iron deficiency anemia, unspecified: Secondary | ICD-10-CM | POA: Diagnosis not present

## 2018-09-16 DIAGNOSIS — E538 Deficiency of other specified B group vitamins: Secondary | ICD-10-CM | POA: Diagnosis not present

## 2018-09-16 DIAGNOSIS — E039 Hypothyroidism, unspecified: Secondary | ICD-10-CM | POA: Diagnosis not present

## 2018-09-16 DIAGNOSIS — R7301 Impaired fasting glucose: Secondary | ICD-10-CM | POA: Diagnosis not present

## 2018-09-16 DIAGNOSIS — Z0130 Encounter for examination of blood pressure without abnormal findings: Secondary | ICD-10-CM | POA: Diagnosis not present

## 2018-09-16 DIAGNOSIS — E782 Mixed hyperlipidemia: Secondary | ICD-10-CM | POA: Diagnosis not present

## 2018-09-16 DIAGNOSIS — Z79899 Other long term (current) drug therapy: Secondary | ICD-10-CM | POA: Diagnosis not present

## 2018-09-16 NOTE — Telephone Encounter (Signed)
OV 10/07/18 

## 2018-09-23 DIAGNOSIS — E039 Hypothyroidism, unspecified: Secondary | ICD-10-CM | POA: Diagnosis not present

## 2018-09-23 DIAGNOSIS — R7301 Impaired fasting glucose: Secondary | ICD-10-CM | POA: Diagnosis not present

## 2018-09-23 DIAGNOSIS — E782 Mixed hyperlipidemia: Secondary | ICD-10-CM | POA: Diagnosis not present

## 2018-09-23 DIAGNOSIS — D509 Iron deficiency anemia, unspecified: Secondary | ICD-10-CM | POA: Diagnosis not present

## 2018-09-25 DIAGNOSIS — Z471 Aftercare following joint replacement surgery: Secondary | ICD-10-CM | POA: Diagnosis not present

## 2018-09-25 DIAGNOSIS — Z96653 Presence of artificial knee joint, bilateral: Secondary | ICD-10-CM | POA: Diagnosis not present

## 2018-09-25 DIAGNOSIS — M25562 Pain in left knee: Secondary | ICD-10-CM | POA: Diagnosis not present

## 2018-10-07 ENCOUNTER — Ambulatory Visit (INDEPENDENT_AMBULATORY_CARE_PROVIDER_SITE_OTHER): Payer: BLUE CROSS/BLUE SHIELD | Admitting: Family Medicine

## 2018-10-07 ENCOUNTER — Encounter: Payer: Self-pay | Admitting: Family Medicine

## 2018-10-07 VITALS — BP 130/79 | HR 67 | Temp 97.5°F | Ht 71.0 in | Wt 256.0 lb

## 2018-10-07 DIAGNOSIS — Z0000 Encounter for general adult medical examination without abnormal findings: Secondary | ICD-10-CM

## 2018-10-07 DIAGNOSIS — E034 Atrophy of thyroid (acquired): Secondary | ICD-10-CM

## 2018-10-07 DIAGNOSIS — E559 Vitamin D deficiency, unspecified: Secondary | ICD-10-CM

## 2018-10-07 DIAGNOSIS — Z8639 Personal history of other endocrine, nutritional and metabolic disease: Secondary | ICD-10-CM | POA: Diagnosis not present

## 2018-10-07 DIAGNOSIS — N400 Enlarged prostate without lower urinary tract symptoms: Secondary | ICD-10-CM

## 2018-10-07 DIAGNOSIS — E039 Hypothyroidism, unspecified: Secondary | ICD-10-CM | POA: Diagnosis not present

## 2018-10-07 DIAGNOSIS — I428 Other cardiomyopathies: Secondary | ICD-10-CM

## 2018-10-07 DIAGNOSIS — I480 Paroxysmal atrial fibrillation: Secondary | ICD-10-CM

## 2018-10-07 DIAGNOSIS — I10 Essential (primary) hypertension: Secondary | ICD-10-CM

## 2018-10-07 DIAGNOSIS — E538 Deficiency of other specified B group vitamins: Secondary | ICD-10-CM | POA: Diagnosis not present

## 2018-10-07 DIAGNOSIS — D508 Other iron deficiency anemias: Secondary | ICD-10-CM

## 2018-10-07 DIAGNOSIS — E7849 Other hyperlipidemia: Secondary | ICD-10-CM

## 2018-10-07 DIAGNOSIS — Z6834 Body mass index (BMI) 34.0-34.9, adult: Secondary | ICD-10-CM | POA: Diagnosis not present

## 2018-10-07 LAB — URINALYSIS, COMPLETE
Bilirubin, UA: NEGATIVE
Glucose, UA: NEGATIVE
Ketones, UA: NEGATIVE
LEUKOCYTES UA: NEGATIVE
Nitrite, UA: NEGATIVE
PROTEIN UA: NEGATIVE
RBC UA: NEGATIVE
Specific Gravity, UA: 1.015 (ref 1.005–1.030)
Urobilinogen, Ur: 0.2 mg/dL (ref 0.2–1.0)
pH, UA: 5.5 (ref 5.0–7.5)

## 2018-10-07 LAB — MICROSCOPIC EXAMINATION
Bacteria, UA: NONE SEEN
Epithelial Cells (non renal): NONE SEEN /hpf (ref 0–10)
RBC, UA: NONE SEEN /hpf (ref 0–2)
Renal Epithel, UA: NONE SEEN /hpf
WBC UA: NONE SEEN /HPF (ref 0–5)

## 2018-10-07 MED ORDER — ROSUVASTATIN CALCIUM 5 MG PO TABS: 5.0000 mg | ORAL_TABLET | Freq: Every day | ORAL | 3 refills | Status: DC

## 2018-10-07 MED ORDER — POLYSACCHARIDE IRON COMPLEX 150 MG PO CAPS: 150.0000 mg | ORAL_CAPSULE | Freq: Every day | ORAL | 3 refills | Status: DC

## 2018-10-07 MED ORDER — SYNTHROID 175 MCG PO TABS: ORAL_TABLET | ORAL | 3 refills | Status: DC

## 2018-10-07 MED ORDER — AMLODIPINE BESYLATE 5 MG PO TABS: 5.0000 mg | ORAL_TABLET | Freq: Every day | ORAL | 3 refills | Status: DC

## 2018-10-07 MED ORDER — HYDROCHLOROTHIAZIDE 25 MG PO TABS: 25.0000 mg | ORAL_TABLET | Freq: Every day | ORAL | 3 refills | Status: DC

## 2018-10-07 MED ORDER — OLMESARTAN MEDOXOMIL 40 MG PO TABS: 20.0000 mg | ORAL_TABLET | Freq: Two times a day (BID) | ORAL | 3 refills | Status: DC

## 2018-10-07 NOTE — Progress Notes (Signed)
Subjective:    Patient ID: Troy Acosta, male    DOB: 1951/05/12, 67 y.o.   MRN: [id number]  HPI Patient is here today for annual wellness exam and follow up of chronic medical problems which includes hypertension and hyperlipidemia. He is taking medication regularly.  The patient is doing well today with no specific complaints.  He is still followed periodically by the cardiologist and did have an ablation in the past because of his atrial fib.  He is due a rectal exam today he did bring lab work in from the outside and will have an additional PSA done at work which he will get to [place].  The lab work that he brings in and has an LDL-C that is good at 77 and a good cholesterol remains low at 35.  Triglycerides are good at 101.  The blood sugar was good at 99 the creatinine, the most important kidney function test was normal.  All of the electrolytes including potassium were good.  Hemoglobin A1c was good at 5.6% B12 level was good at 281 and the TSH was elevated indicating he may need to increase his thyroid medication.  He is requesting refills on all of his medicines.  He has a history of a left total knee replacement.  He has a long history of iron deficiency and takes Crestor and is currently on levothyroxine 175 mcg.  He also takes vitamin D.  And in good spirits.  He does plan to see the endocrinologist this afternoon and they will adjust the thyroid medicine and he is taking 175 mcg daily and 25 twice weekly which has been on the higher dose for a couple weeks.  He does continue to follow-up with Dr. Rayann Heman, his cardiologist on a yearly basis and denies any recurrent atrial fib post ablation.  He is continuing to follow-up with orthopedist, Dr. Alma Friendly for his left knee replacement and pain he is having there.  He recently had an eye exam and gets this yearly.  He denies any chest pain pressure tightness or palpitations.  He denies any shortness of breath.  He denies any change in bowel habits and denies any  trouble with swallowing heartburn indigestion nausea vomiting diarrhea blood in the stool or black tarry bowel movements other than the iron that is causing them to be dark.  He is passing his water well.  His hemoglobin on the most recent report was 15.9 and we could argue to reduce his iron to maybe 5 days a week and maybe we will have him do that.  He is pleasant and in good spirits.     Patient Active Problem List   Diagnosis Date Noted  . S/P TKR (total knee replacement), left 08/24/2017  . Status post total knee replacement, right 03/02/2017  . Chronic intractable headache 08/09/2016  . Other specified hypothyroidism 03/02/2016  . A-fib ([place]) 12/24/2015  . Non-ischemic cardiomyopathy ([place]) 11/07/2015  . Long term (current) use of anticoagulants 11/07/2015  . Diaphoresis 11/01/2015  . Chest pain 11/01/2015  . PAF (paroxysmal atrial fibrillation) ([place]) 07/15/2015  . Heart palpitations 07/11/2015  . Dyspnea 02/18/2015  . Anemia, iron deficiency 09/10/2013  . Diaphragmatic hernia without mention of obstruction or gangrene   . Hypothyroidism   . Malaise and fatigue   . Arteritis, unspecified ([place])   . Iron deficiency anemia, unspecified   . Obesity   . BPH (benign prostatic hyperplasia)   . Essential hypertension, benign   . Hyperlipidemia    Outpatient  Encounter Medications as of 10/07/2018  Medication Sig  . acetaminophen (TYLENOL) 500 MG tablet Take 500 mg by mouth 3 (three) times daily.  Marland Kitchen amLODipine (NORVASC) 5 MG tablet TAKE 1 TABLET BY MOUTH  DAILY  . aspirin EC 81 MG tablet Take 81 mg by mouth daily.  . Cholecalciferol 2000 units CAPS Take 2,000 Units by mouth every evening.  . hydrochlorothiazide (HYDRODIURIL) 25 MG tablet TAKE 1 TABLET BY MOUTH  DAILY  . iron polysaccharides (FERREX 150) 150 MG capsule Take 1 capsule (150 mg total) by mouth daily.  Marland Kitchen olmesartan (BENICAR) 40 MG tablet Take 0.5 tablets (20 mg total) by mouth 2 (two) times daily.  . rosuvastatin  (CRESTOR) 5 MG tablet TAKE 1 TABLET BY MOUTH  DAILY  . SYNTHROID 175 MCG tablet TAKE 1 TABLET BY MOUTH  DAILY BEFORE BREAKFAST   Facility-Administered Encounter Medications as of 10/07/2018  Medication  . gadopentetate dimeglumine (MAGNEVIST) injection 20 mL     Review of Systems  Constitutional: Negative.   HENT: Negative.   Eyes: Negative.   Respiratory: Negative.   Cardiovascular: Negative.   Gastrointestinal: Negative.   Endocrine: Negative.   Genitourinary: Negative.   Musculoskeletal: Negative.   Skin: Negative.   Allergic/Immunologic: Negative.   Neurological: Negative.   Hematological: Negative.   Psychiatric/Behavioral: Negative.        Objective:   Physical Exam Vitals signs and nursing note reviewed.  Constitutional:      Appearance: Normal appearance. He is well-developed. He is obese.     Comments: The patient is pleasant and alert and in good spirits and feeling good about his health  HENT:     Head: Normocephalic and atraumatic.     Right Ear: Tympanic membrane, ear canal and external ear normal. There is no impacted cerumen.     Left Ear: Tympanic membrane, ear canal and external ear normal. There is no impacted cerumen.     Nose: Nose normal.     Mouth/Throat:     Mouth: Mucous membranes are moist.     Pharynx: Oropharynx is clear. No oropharyngeal exudate.  Eyes:     General: No scleral icterus.       Right eye: No discharge.        Left eye: No discharge.     Extraocular Movements: Extraocular movements intact.     Conjunctiva/sclera: Conjunctivae normal.     Pupils: Pupils are equal, round, and reactive to light.     Comments: Up-to-date on eye exams and gets his eyes checked yearly  Neck:     Musculoskeletal: Normal range of motion and neck supple.     Thyroid: No thyromegaly.     Vascular: No carotid bruit.     Trachea: No tracheal deviation.  Cardiovascular:     Rate and Rhythm: Normal rate and regular rhythm.     Pulses: Normal pulses.      Heart sounds: Normal heart sounds. No murmur.     Comments: The heart is regular at 72/min with good pedal pulses and no edema Pulmonary:     Effort: Pulmonary effort is normal.     Breath sounds: Normal breath sounds. No wheezing or rales.     Comments: Clear anteriorly and posteriorly and no axillary adenopathy Chest:     Chest wall: No tenderness.  Abdominal:     General: Abdomen is flat. Bowel sounds are normal.     Palpations: Abdomen is soft. There is no mass.     Tenderness: There  is no abdominal tenderness.     Comments: Obesity without masses tenderness organ enlargement or bruits  Genitourinary:    Penis: Normal.      Scrotum/Testes: Normal.     Rectum: Normal.     Comments: Prostate is slightly enlarged without lumps or masses.  There are no rectal masses.  The external genitalia were within normal limits and no inguinal hernias or inguinal adenopathy were palpated.  Testicles were normal. Musculoskeletal: Normal range of motion.        General: No tenderness.  Lymphadenopathy:     Cervical: No cervical adenopathy.  Skin:    General: Skin is warm and dry.     Coloration: Skin is not pale.     Findings: No erythema or rash.  Neurological:     General: No focal deficit present.     Mental Status: He is alert and oriented to person, place, and time. Mental status is at baseline.     Cranial Nerves: No cranial nerve deficit.     Deep Tendon Reflexes: Reflexes are normal and symmetric.  Psychiatric:        Mood and Affect: Mood normal.        Behavior: Behavior normal.        Thought Content: Thought content normal.        Judgment: Judgment normal.     Comments: Mood affect and behavior were normal for this patient     BP 130/79 (BP Location: Right Arm)   Pulse 67   Temp (!) 97.5 F (36.4 C) (Oral)   Ht 5\' 11"  (1.803 m)   Wt 256 lb (116.1 kg)   BMI 35.70 kg/m        Assessment & Plan:  1. Essential hypertension -The blood pressure is good today and  patient will continue with current treatment and sodium restriction - iron polysaccharides (FERREX 150) 150 MG capsule; Take 1 capsule (150 mg total) by mouth daily.  Dispense: 90 capsule; Refill: 3  2. Other hyperlipidemia -Cholesterol numbers were good other than the good cholesterol was low and patient is aware of this and he will continue with current treatment  3. Hypothyroidism due to acquired atrophy of thyroid -Follow-up with endocrinology as planned  4. Paroxysmal atrial fibrillation (H[facility]) -Follow-up with cardiology as planned  5. Vitamin D deficiency -Continue with vitamin D replacement  6. Annual physical exam -All was stable and patient needs to lose weight through diet and exercise he.  He has morbid obesity based upon his BMI and the fact that he has 2 or more comorbid health conditions. - Urinalysis, Complete  7. Essential hypertension, benign -Continue current treatment - amLODipine (NORVASC) 5 MG tablet; Take 1 tablet (5 mg total) by mouth daily.  Dispense: 90 tablet; Refill: 3  8. Benign prostatic hyperplasia, unspecified whether lower urinary tract symptoms present - Urinalysis, Complete  9. Iron deficiency anemia secondary to inadequate dietary iron intake -Reduce iron to 6 days a week and recheck CBC in 3 months  10. Morbid obesity ([place]) -Continue to work on weight through diet and exercise  11. Non-ischemic cardiomyopathy ([place]) -Follow-up with cardiology as planned  Meds ordered this encounter  Medications  . iron polysaccharides (FERREX 150) 150 MG capsule    Sig: Take 1 capsule (150 mg total) by mouth daily.    Dispense:  90 capsule    Refill:  3  . amLODipine (NORVASC) 5 MG tablet    Sig: Take 1 tablet (5 mg total) by mouth  daily.    Dispense:  90 tablet    Refill:  3  . SYNTHROID 175 MCG tablet    Sig: TAKE 1 TABLET BY MOUTH  DAILY BEFORE BREAKFAST    Dispense:  90 tablet    Refill:  3  . rosuvastatin (CRESTOR) 5 MG tablet    Sig: Take 1  tablet (5 mg total) by mouth daily.    Dispense:  90 tablet    Refill:  3  . olmesartan (BENICAR) 40 MG tablet    Sig: Take 0.5 tablets (20 mg total) by mouth 2 (two) times daily.    Dispense:  180 tablet    Refill:  3  . hydrochlorothiazide (HYDRODIURIL) 25 MG tablet    Sig: Take 1 tablet (25 mg total) by mouth daily.    Dispense:  90 tablet    Refill:  3   Patient Instructions                       Medicare Annual Wellness Visit  [facility] and the medical providers at [facility] strive to bring you the best medical care.  In doing so we not only want to address your current medical conditions and concerns but also to detect new conditions early and prevent illness, disease and health-related problems.    Medicare offers a yearly Wellness Visit which allows our clinical staff to assess your need for preventative services including immunizations, lifestyle education, counseling to decrease risk of preventable diseases and screening for fall risk and other medical concerns.    This visit is provided free of charge (no copay) for all Medicare recipients. The clinical pharmacists at [facility] have begun to conduct these Wellness Visits which will also include a thorough review of all your medications.    As you primary medical provider recommend that you make an appointment for your Annual Wellness Visit if you have not done so already this year.  You may set up this appointment before you leave today or you may call back ([phone]) and schedule an appointment.  Please make sure when you call that you mention that you are scheduling your Annual Wellness Visit with the clinical pharmacist so that the appointment may be made for the proper length of time.     Continue current medications. Continue good therapeutic lifestyle changes which include good diet and exercise. Fall precautions discussed with patient. If an FOBT was given today- please  return it to our front desk. If you are over 11 years old - you may need Prevnar 98 or the adult Pneumonia vaccine.  **Flu shots are available--- please call and schedule a FLU-CLINIC appointment**  After your visit with [place] today you will receive a survey in the mail or online from [employer] regarding your care with [place]. Please take a moment to fill this out. Your feedback is very important to [place] as you can help [place] better understand your patient needs as well as improve your experience and satisfaction. WE CARE ABOUT YOU!!!   Follow-up with endocrinology today as planned Continue to follow-up with orthopedist as needed Continue to work on weight loss through diet and exercise Reduce iron to 6 days weekly and check CBC in 3 months Drink plenty of fluids and stay well-hydrated Continue to work on weight loss with diet and exercise  Arrie Senate MD

## 2018-10-07 NOTE — Patient Instructions (Addendum)
Medicare Annual Wellness Visit  [facility] and the medical providers at [facility] strive to bring you the best medical care.  In doing so we not only want to address your current medical conditions and concerns but also to detect new conditions early and prevent illness, disease and health-related problems.    Medicare offers a yearly Wellness Visit which allows our clinical staff to assess your need for preventative services including immunizations, lifestyle education, counseling to decrease risk of preventable diseases and screening for fall risk and other medical concerns.    This visit is provided free of charge (no copay) for all Medicare recipients. The clinical pharmacists at [facility] have begun to conduct these Wellness Visits which will also include a thorough review of all your medications.    As you primary medical provider recommend that you make an appointment for your Annual Wellness Visit if you have not done so already this year.  You may set up this appointment before you leave today or you may call back ([phone]) and schedule an appointment.  Please make sure when you call that you mention that you are scheduling your Annual Wellness Visit with the clinical pharmacist so that the appointment may be made for the proper length of time.     Continue current medications. Continue good therapeutic lifestyle changes which include good diet and exercise. Fall precautions discussed with patient. If an FOBT was given today- please return it to our front desk. If you are over 81 years old - you may need Prevnar 32 or the adult Pneumonia vaccine.  **Flu shots are available--- please call and schedule a FLU-CLINIC appointment**  After your visit with [place] today you will receive a survey in the mail or online from [employer] regarding your care with [place]. Please take a moment to fill this out. Your feedback is very  important to [place] as you can help [place] better understand your patient needs as well as improve your experience and satisfaction. WE CARE ABOUT YOU!!!   Follow-up with endocrinology today as planned Continue to follow-up with orthopedist as needed Continue to work on weight loss through diet and exercise Reduce iron to 6 days weekly and check CBC in 3 months Drink plenty of fluids and stay well-hydrated Continue to work on weight loss with diet and exercise Get PSA at outside lab and also uric acid and make sure we get a copy of these reports

## 2018-10-10 DIAGNOSIS — M7741 Metatarsalgia, right foot: Secondary | ICD-10-CM | POA: Diagnosis not present

## 2018-10-10 DIAGNOSIS — M7742 Metatarsalgia, left foot: Secondary | ICD-10-CM | POA: Diagnosis not present

## 2018-10-10 DIAGNOSIS — M79672 Pain in left foot: Secondary | ICD-10-CM | POA: Diagnosis not present

## 2018-10-10 DIAGNOSIS — M79671 Pain in right foot: Secondary | ICD-10-CM | POA: Diagnosis not present

## 2018-10-11 ENCOUNTER — Other Ambulatory Visit: Payer: Self-pay | Admitting: *Deleted

## 2018-10-11 ENCOUNTER — Telehealth: Payer: Self-pay | Admitting: Family Medicine

## 2018-10-11 MED ORDER — SYNTHROID 25 MCG PO TABS: ORAL_TABLET | ORAL | 1 refills | Status: DC

## 2018-10-11 NOTE — Telephone Encounter (Signed)
Spoke with pt's wife and advised them to call the thyroid doctor and discuss B12 with them as I'm not sure why they wanted him to take it. His B12 on last labs here were normal. Pt's wife voiced understanding.

## 2018-10-23 ENCOUNTER — Other Ambulatory Visit (HOSPITAL_COMMUNITY): Payer: Self-pay | Admitting: Physician Assistant

## 2018-10-23 ENCOUNTER — Other Ambulatory Visit: Payer: Self-pay | Admitting: Physician Assistant

## 2018-10-23 DIAGNOSIS — Z96652 Presence of left artificial knee joint: Secondary | ICD-10-CM | POA: Diagnosis not present

## 2018-10-23 DIAGNOSIS — Z471 Aftercare following joint replacement surgery: Secondary | ICD-10-CM | POA: Diagnosis not present

## 2018-11-05 ENCOUNTER — Ambulatory Visit (HOSPITAL_COMMUNITY)
Admission: RE | Admit: 2018-11-05 | Discharge: 2018-11-05 | Disposition: A | Discharge status: Home / Self Care | Payer: BLUE CROSS/BLUE SHIELD | Source: Ambulatory Visit | Attending: Physician Assistant | Admitting: Physician Assistant

## 2018-11-05 DIAGNOSIS — Z96652 Presence of left artificial knee joint: Secondary | ICD-10-CM

## 2018-11-05 DIAGNOSIS — R948 Abnormal results of function studies of other organs and systems: Secondary | ICD-10-CM | POA: Diagnosis not present

## 2018-11-05 MED ORDER — TECHNETIUM TC 99M MEDRONATE IV KIT
21.6000 | PACK | Freq: Once | INTRAVENOUS | Status: AC | PRN
  Administered 2018-11-05: 21.6 via INTRAVENOUS

## 2018-11-18 DIAGNOSIS — E039 Hypothyroidism, unspecified: Secondary | ICD-10-CM | POA: Diagnosis not present

## 2018-11-18 DIAGNOSIS — D509 Iron deficiency anemia, unspecified: Secondary | ICD-10-CM | POA: Diagnosis not present

## 2018-11-18 DIAGNOSIS — E538 Deficiency of other specified B group vitamins: Secondary | ICD-10-CM | POA: Diagnosis not present

## 2018-11-18 DIAGNOSIS — R7982 Elevated C-reactive protein (CRP): Secondary | ICD-10-CM | POA: Diagnosis not present

## 2018-11-18 DIAGNOSIS — Z008 Encounter for other general examination: Secondary | ICD-10-CM | POA: Diagnosis not present

## 2018-11-18 DIAGNOSIS — I10 Essential (primary) hypertension: Secondary | ICD-10-CM | POA: Diagnosis not present

## 2018-12-02 DIAGNOSIS — E039 Hypothyroidism, unspecified: Secondary | ICD-10-CM | POA: Diagnosis not present

## 2018-12-02 DIAGNOSIS — E538 Deficiency of other specified B group vitamins: Secondary | ICD-10-CM | POA: Diagnosis not present

## 2018-12-03 DIAGNOSIS — M79672 Pain in left foot: Secondary | ICD-10-CM | POA: Diagnosis not present

## 2018-12-03 DIAGNOSIS — M10072 Idiopathic gout, left ankle and foot: Secondary | ICD-10-CM | POA: Diagnosis not present

## 2018-12-04 DIAGNOSIS — M79673 Pain in unspecified foot: Secondary | ICD-10-CM | POA: Diagnosis not present

## 2018-12-24 DIAGNOSIS — M79672 Pain in left foot: Secondary | ICD-10-CM | POA: Diagnosis not present

## 2018-12-24 DIAGNOSIS — M10072 Idiopathic gout, left ankle and foot: Secondary | ICD-10-CM | POA: Diagnosis not present

## 2018-12-25 ENCOUNTER — Telehealth: Payer: Self-pay | Admitting: Family Medicine

## 2018-12-25 DIAGNOSIS — Z1283 Encounter for screening for malignant neoplasm of skin: Secondary | ICD-10-CM | POA: Diagnosis not present

## 2018-12-25 DIAGNOSIS — B078 Other viral warts: Secondary | ICD-10-CM | POA: Diagnosis not present

## 2018-12-25 DIAGNOSIS — D225 Melanocytic nevi of trunk: Secondary | ICD-10-CM | POA: Diagnosis not present

## 2018-12-25 MED ORDER — PREDNISONE 10 MG PO TABS: ORAL_TABLET | ORAL | 0 refills | Status: DC

## 2018-12-25 NOTE — Telephone Encounter (Signed)
Pt aware med sent 

## 2018-12-25 NOTE — Telephone Encounter (Signed)
Hold indomethacin Start prednisone taper 10 mg 4 times a day on a 2-day schedule number 20 pills Start the prednisone taper tomorrow Watch stomach closely and take Pepcid AC as needed for any indigestion that may be associated with having taken the indomethacin. If gout does not improve get back in touch with [place].

## 2018-12-25 NOTE — Telephone Encounter (Signed)
Wife states that he is having gout in his left foot.  He has been seeing the foot doctor but is not improving with allopurinol and indomethacin.  Foot doctor would like for patient to see pcp but patient is worried about coming in with pandemic.

## 2019-01-08 ENCOUNTER — Telehealth: Payer: Self-pay | Admitting: Family Medicine

## 2019-01-08 MED ORDER — ALLOPURINOL 100 MG PO TABS: 100.0000 mg | ORAL_TABLET | Freq: Every day | ORAL | 3 refills | Status: DC

## 2019-01-08 MED ORDER — COLCHICINE 0.6 MG PO TABS: ORAL_TABLET | ORAL | 1 refills | Status: DC

## 2019-01-08 NOTE — Telephone Encounter (Signed)
Pt aware of directions/ meds sent in

## 2019-01-08 NOTE — Telephone Encounter (Signed)
Patient should start cold cream 0.6 and take 2 stat and then one 1 hour later.  He can then continue with taking 1 daily.  The side effects of this medication are that he can have loose stools with this.  Give him number 14 tablets with 1 refill.  Also at the same time he can go ahead and get a prescription for allopurinol which he can start in 1 week taking 100 mg 1 daily #30.  The Anne Shutter helps to alleviate the acute gout symptoms and the allopurinol that will be started 1 week later will help control and lower the uric acid.  Please call patient with this information and call prescription in for him.

## 2019-01-08 NOTE — Telephone Encounter (Signed)
Took prednisone as directed and finished Had nurse at work re-draw uric acid level.  Range is 4.0-8.0 His is 8.7  He says joints in left toes/ foot is still swollen. But is some better from 2 weeks ago.  Using [facility] today - if needed.

## 2019-02-03 ENCOUNTER — Other Ambulatory Visit: Payer: Self-pay | Admitting: *Deleted

## 2019-02-03 ENCOUNTER — Telehealth: Payer: Self-pay | Admitting: Family Medicine

## 2019-02-03 ENCOUNTER — Other Ambulatory Visit: Payer: BLUE CROSS/BLUE SHIELD

## 2019-02-03 ENCOUNTER — Other Ambulatory Visit: Payer: Self-pay

## 2019-02-03 DIAGNOSIS — E7849 Other hyperlipidemia: Secondary | ICD-10-CM | POA: Diagnosis not present

## 2019-02-03 DIAGNOSIS — E559 Vitamin D deficiency, unspecified: Secondary | ICD-10-CM

## 2019-02-03 DIAGNOSIS — M109 Gout, unspecified: Secondary | ICD-10-CM

## 2019-02-03 DIAGNOSIS — I10 Essential (primary) hypertension: Secondary | ICD-10-CM

## 2019-02-03 DIAGNOSIS — E034 Atrophy of thyroid (acquired): Secondary | ICD-10-CM

## 2019-02-03 NOTE — Telephone Encounter (Signed)
PT was told to by dr Laurance Flatten to call and ask jamie about getting blood work done for his gout because it is not better

## 2019-02-03 NOTE — Telephone Encounter (Signed)
Spoke with pt about labs . Coming in today.

## 2019-02-04 ENCOUNTER — Other Ambulatory Visit: Payer: Self-pay | Admitting: *Deleted

## 2019-02-04 LAB — LIPID PANEL
Chol/HDL Ratio: 3.7 ratio (ref 0.0–5.0)
Cholesterol, Total: 117 mg/dL (ref 100–199)
HDL: 32 mg/dL — ABNORMAL LOW (ref >39)
LDL Calculated: 46 mg/dL (ref 0–99)
Triglycerides: 196 mg/dL — ABNORMAL HIGH (ref 0–149)
VLDL Cholesterol Cal: 39 mg/dL (ref 5–40)

## 2019-02-04 LAB — BMP8+EGFR
BUN/Creatinine Ratio: 20 (ref 10–24)
BUN: 23 mg/dL (ref 8–27)
CO2: 26 mmol/L (ref 20–29)
Calcium: 9.8 mg/dL (ref 8.6–10.2)
Chloride: 100 mmol/L (ref 96–106)
Creatinine, Ser: 1.14 mg/dL (ref 0.76–1.27)
GFR calc Af Amer: 78 mL/min/{1.73_m2} (ref >59)
GFR calc non Af Amer: 67 mL/min/{1.73_m2} (ref >59)
Glucose: 85 mg/dL (ref 65–99)
Potassium: 4.4 mmol/L (ref 3.5–5.2)
Sodium: 142 mmol/L (ref 134–144)

## 2019-02-04 LAB — HEPATIC FUNCTION PANEL
ALT: 28 IU/L (ref 0–44)
AST: 19 IU/L (ref 0–40)
Albumin: 4.6 g/dL (ref 3.8–4.8)
Alkaline Phosphatase: 77 IU/L (ref 39–117)
Bilirubin Total: 0.3 mg/dL (ref 0.0–1.2)
Bilirubin, Direct: 0.11 mg/dL (ref 0.00–0.40)
Total Protein: 7.2 g/dL (ref 6.0–8.5)

## 2019-02-04 LAB — CBC WITH DIFFERENTIAL/PLATELET
Basophils Absolute: 0 10*3/uL (ref 0.0–0.2)
Basos: 0 %
EOS (ABSOLUTE): 0.1 10*3/uL (ref 0.0–0.4)
Eos: 1 %
Hematocrit: 46.8 % (ref 37.5–51.0)
Hemoglobin: 15.5 g/dL (ref 13.0–17.7)
Immature Grans (Abs): 0 10*3/uL (ref 0.0–0.1)
Immature Granulocytes: 0 %
Lymphocytes Absolute: 2.4 10*3/uL (ref 0.7–3.1)
Lymphs: 27 %
MCH: 28.4 pg (ref 26.6–33.0)
MCHC: 33.1 g/dL (ref 31.5–35.7)
MCV: 86 fL (ref 79–97)
Monocytes Absolute: 0.7 10*3/uL (ref 0.1–0.9)
Monocytes: 8 %
Neutrophils Absolute: 5.8 10*3/uL (ref 1.4–7.0)
Neutrophils: 64 %
Platelets: 252 10*3/uL (ref 150–450)
RBC: 5.45 x10E6/uL (ref 4.14–5.80)
RDW: 13 % (ref 11.6–15.4)
WBC: 9.1 10*3/uL (ref 3.4–10.8)

## 2019-02-04 LAB — URIC ACID: Uric Acid: 7.1 mg/dL (ref 3.7–8.6)

## 2019-02-04 LAB — THYROID PANEL WITH TSH
Free Thyroxine Index: 2.6 (ref 1.2–4.9)
T3 Uptake Ratio: 27 % (ref 24–39)
T4, Total: 9.8 ug/dL (ref 4.5–12.0)
TSH: 1.03 u[IU]/mL (ref 0.450–4.500)

## 2019-02-04 MED ORDER — ALLOPURINOL 100 MG PO TABS: 200.0000 mg | ORAL_TABLET | Freq: Every day | ORAL | 3 refills | Status: DC

## 2019-02-11 ENCOUNTER — Telehealth: Payer: Self-pay

## 2019-02-11 DIAGNOSIS — I10 Essential (primary) hypertension: Secondary | ICD-10-CM

## 2019-02-11 MED ORDER — POLYSACCHARIDE IRON COMPLEX 150 MG PO CAPS: 150.0000 mg | ORAL_CAPSULE | Freq: Every day | ORAL | 0 refills | Status: DC

## 2019-02-11 NOTE — Telephone Encounter (Signed)
erroneous

## 2019-03-19 DIAGNOSIS — D509 Iron deficiency anemia, unspecified: Secondary | ICD-10-CM | POA: Diagnosis not present

## 2019-03-19 DIAGNOSIS — M109 Gout, unspecified: Secondary | ICD-10-CM | POA: Diagnosis not present

## 2019-03-26 ENCOUNTER — Telehealth: Payer: Self-pay | Admitting: Family Medicine

## 2019-03-26 DIAGNOSIS — M109 Gout, unspecified: Secondary | ICD-10-CM | POA: Diagnosis not present

## 2019-03-26 DIAGNOSIS — D509 Iron deficiency anemia, unspecified: Secondary | ICD-10-CM | POA: Diagnosis not present

## 2019-03-26 NOTE — Telephone Encounter (Signed)
Pt wanted to switch to dettinger since DWM retiring and scheduled f/u with him on 04/17/19 at 3:25.

## 2019-04-08 ENCOUNTER — Ambulatory Visit: Payer: BLUE CROSS/BLUE SHIELD | Admitting: Family Medicine

## 2019-04-16 ENCOUNTER — Other Ambulatory Visit: Payer: Self-pay

## 2019-04-17 ENCOUNTER — Ambulatory Visit: Payer: BC Managed Care – PPO | Admitting: Family Medicine

## 2019-04-17 ENCOUNTER — Encounter: Payer: Self-pay | Admitting: Family Medicine

## 2019-04-17 VITALS — BP 123/74 | HR 65 | Temp 97.6°F | Ht 71.0 in | Wt 255.6 lb

## 2019-04-17 DIAGNOSIS — I10 Essential (primary) hypertension: Secondary | ICD-10-CM | POA: Diagnosis not present

## 2019-04-17 DIAGNOSIS — M722 Plantar fascial fibromatosis: Secondary | ICD-10-CM | POA: Diagnosis not present

## 2019-04-17 DIAGNOSIS — E038 Other specified hypothyroidism: Secondary | ICD-10-CM

## 2019-04-17 DIAGNOSIS — E7849 Other hyperlipidemia: Secondary | ICD-10-CM

## 2019-04-17 MED ORDER — METHYLPREDNISOLONE ACETATE 80 MG/ML IJ SUSP
80.0000 mg | Freq: Once | INTRAMUSCULAR | Status: AC
  Administered 2019-04-17: 17:00:00 80 mg via INTRAMUSCULAR

## 2019-04-17 NOTE — Progress Notes (Signed)
BP 123/74   Pulse 65   Temp 97.6 F (36.4 C) (Oral)   Ht 5\' 11"  (1.803 m)   Wt 255 lb 9.6 oz (115.9 kg)   BMI 35.65 kg/m    Subjective:   Patient ID: Troy Acosta, male    DOB: 1950-11-02, 68 y.o.   MRN: [id number]  HPI: Troy Acosta is a 68 y.o. male presenting on 04/17/2019 for Establish Care ([facility] patient - 6 month follow up of chronic medical conditions)   HPI Hypothyroidism recheck Patient is coming in for thyroid recheck today as well. They deny any issues with hair changes or heat or cold problems or diarrhea or constipation. They deny any chest pain or palpitations. They are currently on levothyroxine 61micrograms   Hypertension Patient is currently on hydrochlorothiazide amlodipine and olmesartan, and their blood pressure today is 123/74. Patient denies any lightheadedness or dizziness. Patient denies headaches, blurred vision, chest pains, shortness of breath, or weakness. Denies any side effects from medication and is content with current medication.   Hyperlipidemia Patient is coming in for recheck of his hyperlipidemia. The patient is currently taking Crestor. They deny any issues with myalgias or history of liver damage from it. They deny any focal numbness or weakness or chest pain.   Bilateral foot pain Patient has pain under both of his feet under the heel and under the arch of his feet that has been bothering him over the past few weeks.  He says it hurts more when he walks.  He says that he tries to have good shoes but he does admit that he walks around his house barefoot at times on the hard floors.  Relevant past medical, surgical, family and social history reviewed and updated as indicated. Interim medical history since our last visit reviewed. Allergies and medications reviewed and updated.  Review of Systems  Constitutional: Negative for chills and fever.  Eyes: Negative for discharge.  Respiratory: Negative for shortness of breath and wheezing.   Cardiovascular:  Negative for chest pain and leg swelling.  Musculoskeletal: Positive for arthralgias. Negative for back pain and gait problem.  Skin: Negative for rash.  Neurological: Negative for dizziness, weakness, light-headedness and numbness.  All other systems reviewed and are negative.   Per HPI unless specifically indicated above   Allergies as of 04/17/2019   No Known Allergies     Medication List       Accurate as of April 17, 2019  5:07 PM. If you have any questions, ask your nurse or doctor.        STOP taking these medications   colchicine 0.6 MG tablet Stopped by: [name] , MD     TAKE these medications   acetaminophen 500 MG tablet Commonly known as: TYLENOL Take 500 mg by mouth 3 (three) times daily.   allopurinol 100 MG tablet Commonly known as: ZYLOPRIM Take 2 tablets (200 mg total) by mouth daily.   amLODipine 5 MG tablet Commonly known as: NORVASC Take 1 tablet (5 mg total) by mouth daily.   aspirin EC 81 MG tablet Take 81 mg by mouth daily.   Cholecalciferol 50 MCG (2000 UT) Caps Take 2,000 Units by mouth every evening.   hydrochlorothiazide 25 MG tablet Commonly known as: HYDRODIURIL Take 1 tablet (25 mg total) by mouth daily.   iron polysaccharides 150 MG capsule Commonly known as: Ferrex 150 Take 1 capsule (150 mg total) by mouth daily.   olmesartan 40 MG tablet Commonly known as: BENICAR Take  0.5 tablets (20 mg total) by mouth 2 (two) times daily.   rosuvastatin 5 MG tablet Commonly known as: CRESTOR Take 1 tablet (5 mg total) by mouth daily.   levothyroxine 200 MCG tablet Commonly known as: SYNTHROID Take 200 mcg by mouth daily before breakfast. What changed: Another medication with the same name was removed. Continue taking this medication, and follow the directions you see here. Changed by: Worthy Rancher, MD   Synthroid 25 MCG tablet Generic drug: levothyroxine Take one tab po M, W F in addition to 175 mcg What changed:  Another medication with the same name was removed. Continue taking this medication, and follow the directions you see here. Changed by: [name] , MD        Objective:   BP 123/74   Pulse 65   Temp 97.6 F (36.4 C) (Oral)   Ht 5\' 11"  (1.803 m)   Wt 255 lb 9.6 oz (115.9 kg)   BMI 35.65 kg/m   Wt Readings from Last 3 Encounters:  04/17/19 255 lb 9.6 oz (115.9 kg)  10/07/18 256 lb (116.1 kg)  09/14/18 255 lb 12.8 oz (116 kg)    Physical Exam Vitals signs and nursing note reviewed.  Constitutional:      General: He is not in acute distress.    Appearance: He is well-developed. He is not diaphoretic.  Eyes:     General: No scleral icterus.    Conjunctiva/sclera: Conjunctivae normal.  Neck:     Musculoskeletal: Neck supple.     Thyroid: No thyromegaly.  Cardiovascular:     Rate and Rhythm: Normal rate and regular rhythm.     Heart sounds: Normal heart sounds. No murmur.  Pulmonary:     Effort: Pulmonary effort is normal. No respiratory distress.     Breath sounds: Normal breath sounds. No wheezing.  Musculoskeletal: Normal range of motion.       Feet:  Lymphadenopathy:     Cervical: No cervical adenopathy.  Skin:    General: Skin is warm and dry.     Findings: No rash.  Neurological:     Mental Status: He is alert and oriented to person, place, and time.     Coordination: Coordination normal.  Psychiatric:        Behavior: Behavior normal.     Bilateral plantar fasciitis injection: Consent form signed. Risk factors of bleeding and infection discussed with patient and patient is agreeable towards injection. Patient prepped with Betadine.  Inferior approach towards injection used. Injected 40 mg of Depo-Medrol and 1 mL of 2% lidocaine. Patient tolerated procedure well and no side effects from noted. Minimal to no bleeding. Simple bandage applied after.   Assessment & Plan:   Problem List Items Addressed This Visit      Cardiovascular and Mediastinum    Essential hypertension, benign     Endocrine   Hypothyroidism - Primary   Relevant Medications   levothyroxine (SYNTHROID) 200 MCG tablet     Other   Hyperlipidemia    Other Visit Diagnoses    Plantar fasciitis       Relevant Medications   methylPREDNISolone acetate (DEPO-MEDROL) injection 80 mg (Completed)   methylPREDNISolone acetate (DEPO-MEDROL) injection 80 mg (Completed)      Continue levothyroxine, did injections for plantar fasciitis  We will check blood work today Follow up plan: Return in about 5 months (around 09/17/2019), or if symptoms worsen or fail to improve, for Hypertension cholesterol and thyroid.  Counseling provided for all  of the vaccine components No orders of the defined types were placed in this encounter.   Caryl Pina, MD [facility] 04/17/2019, 5:07 PM

## 2019-06-09 DIAGNOSIS — M79671 Pain in right foot: Secondary | ICD-10-CM | POA: Diagnosis not present

## 2019-06-09 DIAGNOSIS — Z6835 Body mass index (BMI) 35.0-35.9, adult: Secondary | ICD-10-CM | POA: Diagnosis not present

## 2019-06-10 DIAGNOSIS — M722 Plantar fascial fibromatosis: Secondary | ICD-10-CM | POA: Diagnosis not present

## 2019-06-10 DIAGNOSIS — M79671 Pain in right foot: Secondary | ICD-10-CM | POA: Diagnosis not present

## 2019-06-24 DIAGNOSIS — M79671 Pain in right foot: Secondary | ICD-10-CM | POA: Diagnosis not present

## 2019-06-24 DIAGNOSIS — M722 Plantar fascial fibromatosis: Secondary | ICD-10-CM | POA: Diagnosis not present

## 2019-07-14 DIAGNOSIS — Z79899 Other long term (current) drug therapy: Secondary | ICD-10-CM | POA: Diagnosis not present

## 2019-07-14 DIAGNOSIS — Z139 Encounter for screening, unspecified: Secondary | ICD-10-CM | POA: Diagnosis not present

## 2019-07-14 DIAGNOSIS — D509 Iron deficiency anemia, unspecified: Secondary | ICD-10-CM | POA: Diagnosis not present

## 2019-07-14 DIAGNOSIS — E039 Hypothyroidism, unspecified: Secondary | ICD-10-CM | POA: Diagnosis not present

## 2019-07-14 DIAGNOSIS — E782 Mixed hyperlipidemia: Secondary | ICD-10-CM | POA: Diagnosis not present

## 2019-07-14 DIAGNOSIS — E559 Vitamin D deficiency, unspecified: Secondary | ICD-10-CM | POA: Diagnosis not present

## 2019-07-14 DIAGNOSIS — I10 Essential (primary) hypertension: Secondary | ICD-10-CM | POA: Diagnosis not present

## 2019-07-21 DIAGNOSIS — D509 Iron deficiency anemia, unspecified: Secondary | ICD-10-CM | POA: Diagnosis not present

## 2019-07-21 DIAGNOSIS — E538 Deficiency of other specified B group vitamins: Secondary | ICD-10-CM | POA: Diagnosis not present

## 2019-07-21 DIAGNOSIS — E039 Hypothyroidism, unspecified: Secondary | ICD-10-CM | POA: Diagnosis not present

## 2019-07-21 DIAGNOSIS — M109 Gout, unspecified: Secondary | ICD-10-CM | POA: Diagnosis not present

## 2019-07-21 DIAGNOSIS — Z23 Encounter for immunization: Secondary | ICD-10-CM | POA: Diagnosis not present

## 2019-09-12 ENCOUNTER — Ambulatory Visit: Payer: BLUE CROSS/BLUE SHIELD | Admitting: Internal Medicine

## 2019-09-15 DIAGNOSIS — H40013 Open angle with borderline findings, low risk, bilateral: Secondary | ICD-10-CM | POA: Diagnosis not present

## 2019-09-15 DIAGNOSIS — H35363 Drusen (degenerative) of macula, bilateral: Secondary | ICD-10-CM | POA: Diagnosis not present

## 2019-09-15 DIAGNOSIS — H35033 Hypertensive retinopathy, bilateral: Secondary | ICD-10-CM | POA: Diagnosis not present

## 2019-09-15 DIAGNOSIS — H524 Presbyopia: Secondary | ICD-10-CM | POA: Diagnosis not present

## 2019-09-15 DIAGNOSIS — H2513 Age-related nuclear cataract, bilateral: Secondary | ICD-10-CM | POA: Diagnosis not present

## 2019-09-22 ENCOUNTER — Other Ambulatory Visit: Payer: Self-pay | Admitting: Family Medicine

## 2019-09-22 DIAGNOSIS — I10 Essential (primary) hypertension: Secondary | ICD-10-CM

## 2019-10-17 ENCOUNTER — Ambulatory Visit: Payer: BLUE CROSS/BLUE SHIELD | Admitting: Internal Medicine

## 2019-10-20 ENCOUNTER — Other Ambulatory Visit: Payer: Self-pay | Admitting: Family Medicine

## 2019-10-20 DIAGNOSIS — I10 Essential (primary) hypertension: Secondary | ICD-10-CM

## 2019-10-29 ENCOUNTER — Other Ambulatory Visit: Payer: Self-pay | Admitting: Family Medicine

## 2019-10-29 NOTE — Telephone Encounter (Signed)
Spoke with pt's wife and advised we sent multiple rx's to [facility] 09/22/19 for 90 day supply and escribe status shows receipt confirmed by pharmacy. Advised pt's wife to call [facility].

## 2019-11-13 ENCOUNTER — Telehealth: Payer: Self-pay | Admitting: Student

## 2019-11-13 ENCOUNTER — Ambulatory Visit: Payer: Medicare Other | Attending: Internal Medicine

## 2019-11-13 DIAGNOSIS — Z23 Encounter for immunization: Secondary | ICD-10-CM

## 2019-11-13 NOTE — Progress Notes (Signed)
   Covid-19 Vaccination Clinic  Name:  Troy Acosta    MRN: [id number] DOB: 1951-02-06  11/13/2019  [name] was observed post Covid-19 immunization for 15 minutes without incidence. He was provided with Vaccine Information Sheet and instruction to access the V-Safe system.   [name] was instructed to call 911 with any severe reactions post vaccine: Marland Kitchen Difficulty breathing  . Swelling of your face and throat  . A fast heartbeat  . A bad rash all over your body  . Dizziness and weakness    Immunizations Administered    Name Date Dose VIS Date Route   Moderna COVID-19 Vaccine 11/13/2019 12:57 PM 0.5 mL 09/09/2019 Intramuscular   Manufacturer: Moderna   Lot: [id number]   [id number]

## 2019-11-14 ENCOUNTER — Ambulatory Visit: Payer: Medicare Other | Admitting: Internal Medicine

## 2019-11-14 ENCOUNTER — Other Ambulatory Visit: Payer: Self-pay

## 2019-11-14 VITALS — BP 130/80 | HR 70 | Ht 71.0 in | Wt 259.0 lb

## 2019-11-14 DIAGNOSIS — I10 Essential (primary) hypertension: Secondary | ICD-10-CM

## 2019-11-14 DIAGNOSIS — E663 Overweight: Secondary | ICD-10-CM

## 2019-11-14 DIAGNOSIS — D6869 Other thrombophilia: Secondary | ICD-10-CM | POA: Diagnosis not present

## 2019-11-14 DIAGNOSIS — I480 Paroxysmal atrial fibrillation: Secondary | ICD-10-CM | POA: Diagnosis not present

## 2019-11-14 NOTE — Patient Instructions (Signed)

## 2019-11-14 NOTE — Progress Notes (Signed)
PCP: Dettinger, [name], MD Primary Cardiologist: Kiyan Mcclave is a 69 y.o. male who presents today for routine electrophysiology followup.  Since last being seen in office, the patient reports doing very well.  Today, he denies symptoms of palpitations, chest pain, shortness of breath,  lower extremity edema, dizziness, presyncope, or syncope.  The patient is otherwise without complaint today.   Past Medical History:  Diagnosis Date  . Anxiety   . Arthritis    "all my joints" (08/24/2017)  . Diaphragmatic hernia without mention of obstruction or gangrene   . Dysrhythmia    afib, ablation- 12/2015, no problems since, off blood thinner- 09/2016  . Essential hypertension, benign   . Headache    had MRI- was consulted with Dr. Jannifer Franklin,, headache has resolved"probably related to caffeine"  . Hyperplasia of prostate   . Iron deficiency anemia, unspecified   . Malaise and fatigue   . Obesity   . Other and unspecified hyperlipidemia   . Paroxysmal atrial fibrillation (HCC)   . PONV (postoperative nausea and vomiting)   . Unspecified hypothyroidism   . Wears dentures    Past Surgical History:  Procedure Laterality Date  . COLONOSCOPY WITH ESOPHAGOGASTRODUODENOSCOPY (EGD)    . ELECTROPHYSIOLOGIC STUDY N/A 12/24/2015   Afib ablation by Dr Rayann Heman  . JOINT REPLACEMENT    . KNEE ARTHROSCOPY Right ~ 2016  . LUNG BIOPSY  1990s   "no problems identified"  . MULTIPLE TOOTH EXTRACTIONS    . TOTAL KNEE ARTHROPLASTY Right 03/02/2017   Procedure: RIGHT TOTAL KNEE ARTHROPLASTY;  Surgeon: [name], MD;  Location: [facility];  Service: Orthopedics;  Laterality: Right;  . TOTAL KNEE ARTHROPLASTY Left 08/24/2017  . TOTAL KNEE ARTHROPLASTY Left 08/24/2017   Procedure: LEFT TOTAL KNEE ARTHROPLASTY;  Surgeon: [name], MD;  Location: [facility];  Service: Orthopedics;  Laterality: Left;    ROS- all systems are personally reviewed and negatives except as per HPI above  Current Outpatient  Medications  Medication Sig Dispense Refill  . acetaminophen (TYLENOL) 500 MG tablet Take 500 mg by mouth 3 (three) times daily.    Marland Kitchen allopurinol (ZYLOPRIM) 100 MG tablet Take 2 tablets (200 mg total) by mouth daily. 180 tablet 3  . amLODipine (NORVASC) 5 MG tablet TAKE 1 TABLET BY MOUTH  DAILY 90 tablet 0  . aspirin EC 81 MG tablet Take 81 mg by mouth daily.    . Cholecalciferol 2000 units CAPS Take 2,000 Units by mouth every evening.    Marland Kitchen FERREX 150 150 MG capsule Take 1 capsule by mouth once daily 30 capsule 0  . hydrochlorothiazide (HYDRODIURIL) 25 MG tablet TAKE 1 TABLET BY MOUTH  DAILY 90 tablet 0  . levothyroxine (SYNTHROID) 200 MCG tablet Take 200 mcg by mouth daily before breakfast.    . olmesartan (BENICAR) 40 MG tablet Take 0.5 tablets (20 mg total) by mouth 2 (two) times daily. 180 tablet 3  . rosuvastatin (CRESTOR) 5 MG tablet TAKE 1 TABLET BY MOUTH  DAILY 90 tablet 0  . SYNTHROID 25 MCG tablet Take one tab po M, W F in addition to 175 mcg 90 tablet 1   No current facility-administered medications for this visit.   Facility-Administered Medications Ordered in Other Visits  Medication Dose Route Frequency Provider Last Rate Last Admin  . gadopentetate dimeglumine (MAGNEVIST) injection 20 mL  20 mL Intravenous Once PRN Kathrynn Ducking, MD        Physical Exam: Vitals:   11/14/19 1151  BP: 130/80  Pulse: 70  SpO2: 97%  Weight: 259 lb (117.5 kg)  Height: 5\' 11"  (1.803 m)    GEN- The patient is well appearing, alert and oriented x 3 today.   Head- normocephalic, atraumatic Eyes-  Sclera clear, conjunctiva pink Ears- hearing intact Oropharynx- clear Lungs- Clear to ausculation bilaterally, normal work of breathing Heart- Regular rate and rhythm, no murmurs, rubs or gallops, PMI not laterally displaced GI- soft, NT, ND, + BS Extremities- no clubbing, cyanosis, or edema  EKG tracing ordered today is personally reviewed and shows sinus rhythm, rate 69  Assessment and  Plan:  1. paroxysmal atrial fibrillation Doing well s/p ablation off AAD therapy chads2vasc score is 2 He feels that afib is resolved and is not interested in [facility] at this time.  2.  HTN Stable No change required today  3.  Tachycardia mediated cardiomyopathy EF normalized with SR Repeat echo on return  4.  Overweight Body mass index is 36.12 kg/m. Weight loss encouraged   Return to see me in 1 year   [name] MD, [facility] 11/14/2019 12:39 PM

## 2019-11-17 ENCOUNTER — Ambulatory Visit (INDEPENDENT_AMBULATORY_CARE_PROVIDER_SITE_OTHER): Payer: Medicare Other | Admitting: Family Medicine

## 2019-11-17 ENCOUNTER — Encounter: Payer: Self-pay | Admitting: Family Medicine

## 2019-11-17 VITALS — BP 125/76 | HR 74 | Temp 98.2°F | Ht 71.0 in | Wt 258.2 lb

## 2019-11-17 DIAGNOSIS — I10 Essential (primary) hypertension: Secondary | ICD-10-CM | POA: Diagnosis not present

## 2019-11-17 DIAGNOSIS — G6289 Other specified polyneuropathies: Secondary | ICD-10-CM | POA: Diagnosis not present

## 2019-11-17 DIAGNOSIS — E038 Other specified hypothyroidism: Secondary | ICD-10-CM | POA: Diagnosis not present

## 2019-11-17 DIAGNOSIS — I480 Paroxysmal atrial fibrillation: Secondary | ICD-10-CM

## 2019-11-17 DIAGNOSIS — E7849 Other hyperlipidemia: Secondary | ICD-10-CM

## 2019-11-17 MED ORDER — OLMESARTAN MEDOXOMIL 40 MG PO TABS: 20.0000 mg | ORAL_TABLET | Freq: Two times a day (BID) | ORAL | 3 refills | Status: DC

## 2019-11-17 MED ORDER — HYDROCHLOROTHIAZIDE 25 MG PO TABS: 25.0000 mg | ORAL_TABLET | Freq: Every day | ORAL | 3 refills | Status: DC

## 2019-11-17 MED ORDER — AMLODIPINE BESYLATE 5 MG PO TABS: 5.0000 mg | ORAL_TABLET | Freq: Every day | ORAL | 3 refills | Status: DC

## 2019-11-17 MED ORDER — POLYSACCHARIDE IRON COMPLEX 150 MG PO CAPS: 150.0000 mg | ORAL_CAPSULE | Freq: Every day | ORAL | 3 refills | Status: DC

## 2019-11-17 MED ORDER — ROSUVASTATIN CALCIUM 5 MG PO TABS: 5.0000 mg | ORAL_TABLET | Freq: Every day | ORAL | 3 refills | Status: DC

## 2019-11-17 MED ORDER — ALLOPURINOL 100 MG PO TABS: 200.0000 mg | ORAL_TABLET | Freq: Every day | ORAL | 3 refills | Status: DC

## 2019-11-17 MED ORDER — LEVOTHYROXINE SODIUM 200 MCG PO TABS: 200.0000 ug | ORAL_TABLET | Freq: Every day | ORAL | 3 refills | Status: DC

## 2019-11-17 NOTE — Progress Notes (Signed)
BP 125/76   Pulse 74   Temp 98.2 F (36.8 C) (Temporal)   Ht 5\' 11"  (1.803 m)   Wt 258 lb 3.2 oz (117.1 kg)   SpO2 97%   BMI 36.01 kg/m    Subjective:   Patient ID: Troy Acosta, male    DOB: 12-09-1950, 69 y.o.   MRN: [id number]  HPI: Troy Acosta is a 69 y.o. male presenting on 11/17/2019 for Annual Exam and Foot Pain (bilateral - tinggling in joints and bottom of feet.  Patient states it has been going on for years)   HPI Hypothyroidism recheck Patient has an endocrinologist for this.  Patient is coming in for thyroid recheck today as well. They deny any issues with hair changes or heat or cold problems or diarrhea or constipation. They deny any chest pain or palpitations. They are currently on levothyroxine 232micrograms   Hypertension and paroxysmal A. fib Patient sees Dr. Rayann Heman for cardiology for this.  Patient is currently on amlodipine and hydrochlorothiazide and olmesartan, and their blood pressure today is 125/76. Patient denies any lightheadedness or dizziness. Patient denies headaches, blurred vision, chest pains, shortness of breath, or weakness. Denies any side effects from medication and is content with current medication.   Hyperlipidemia Patient is coming in for recheck of his hyperlipidemia. The patient is currently taking Crestor. They deny any issues with myalgias or history of liver damage from it. They deny any focal numbness or weakness or chest pain.   Patient complains of a tingling and numbness in both of his feet towards toes, more near the ball of his feet to his toes.  He says is been going on for quite some time but it definitely has increased recently over the past few months.  He has a history of some spinal issues and some injections in his back.  He had normal thyroid folate and B12 in October.  Relevant past medical, surgical, family and social history reviewed and updated as indicated. Interim medical history since our last visit reviewed. Allergies and  medications reviewed and updated.  Review of Systems  Constitutional: Negative for chills and fever.  Eyes: Negative for visual disturbance.  Respiratory: Negative for shortness of breath and wheezing.   Cardiovascular: Negative for chest pain and leg swelling.  Musculoskeletal: Negative for back pain and gait problem.  Skin: Negative for rash.  Neurological: Positive for numbness. Negative for dizziness, weakness and light-headedness.  All other systems reviewed and are negative.   Per HPI unless specifically indicated above   Allergies as of 11/17/2019   No Known Allergies     Medication List       Accurate as of November 17, 2019  2:36 PM. If you have any questions, ask your nurse or doctor.        acetaminophen 500 MG tablet Commonly known as: TYLENOL Take 500 mg by mouth 3 (three) times daily.   allopurinol 100 MG tablet Commonly known as: ZYLOPRIM Take 2 tablets (200 mg total) by mouth daily.   amLODipine 5 MG tablet Commonly known as: NORVASC Take 1 tablet (5 mg total) by mouth daily.   aspirin EC 81 MG tablet Take 81 mg by mouth daily.   Cholecalciferol 50 MCG (2000 UT) Caps Take 2,000 Units by mouth every evening.   hydrochlorothiazide 25 MG tablet Commonly known as: HYDRODIURIL Take 1 tablet (25 mg total) by mouth daily.   iron polysaccharides 150 MG capsule Commonly known as: Ferrex 150 Take 1 capsule (150 mg  total) by mouth daily. What changed: how much to take Changed by: Worthy Rancher, MD   levothyroxine 200 MCG tablet Commonly known as: SYNTHROID Take 1 tablet (200 mcg total) by mouth daily before breakfast. What changed: Another medication with the same name was removed. Continue taking this medication, and follow the directions you see here. Changed by: [name] Lacreshia Bondarenko, MD   olmesartan 40 MG tablet Commonly known as: BENICAR Take 0.5 tablets (20 mg total) by mouth 2 (two) times daily.   rosuvastatin 5 MG tablet Commonly known as:  CRESTOR Take 1 tablet (5 mg total) by mouth daily.        Objective:   BP 125/76   Pulse 74   Temp 98.2 F (36.8 C) (Temporal)   Ht 5\' 11"  (1.803 m)   Wt 258 lb 3.2 oz (117.1 kg)   SpO2 97%   BMI 36.01 kg/m   Wt Readings from Last 3 Encounters:  11/17/19 258 lb 3.2 oz (117.1 kg)  11/14/19 259 lb (117.5 kg)  04/17/19 255 lb 9.6 oz (115.9 kg)    Physical Exam Vitals and nursing note reviewed.  Constitutional:      General: He is not in acute distress.    Appearance: He is well-developed. He is not diaphoretic.  Eyes:     General: No scleral icterus.    Conjunctiva/sclera: Conjunctivae normal.  Neck:     Thyroid: No thyromegaly.  Cardiovascular:     Rate and Rhythm: Normal rate and regular rhythm.     Heart sounds: Normal heart sounds. No murmur.  Pulmonary:     Effort: Pulmonary effort is normal. No respiratory distress.     Breath sounds: Normal breath sounds. No wheezing.  Musculoskeletal:        General: Normal range of motion.     Cervical back: Neck supple.  Lymphadenopathy:     Cervical: No cervical adenopathy.  Skin:    General: Skin is warm and dry.     Findings: No rash.  Neurological:     Mental Status: He is alert and oriented to person, place, and time.     Sensory: Sensory deficit (On foot exam, numbness on his middle 3 toes, decreased sensation on his forefoot and improved sensation on his heel.) present.     Motor: No weakness.     Coordination: Coordination normal.  Psychiatric:        Behavior: Behavior normal.     Patient just had labs done in October 2021  through his workplace including cholesterol and glucose and uric acid and A1c and iron and CBC with differential and an A1c and all were within normal limits, he also had a TSH and a T4 free and they were also normal with a PSA that was 1.6  Assessment & Plan:   Problem List Items Addressed This Visit      Cardiovascular and Mediastinum   Essential hypertension, benign   Relevant  Medications   amLODipine (NORVASC) 5 MG tablet   hydrochlorothiazide (HYDRODIURIL) 25 MG tablet   rosuvastatin (CRESTOR) 5 MG tablet   olmesartan (BENICAR) 40 MG tablet   Other Relevant Orders   Fecal occult blood, imunochemical   PAF (paroxysmal atrial fibrillation) (HCC)   Relevant Medications   amLODipine (NORVASC) 5 MG tablet   hydrochlorothiazide (HYDRODIURIL) 25 MG tablet   rosuvastatin (CRESTOR) 5 MG tablet   olmesartan (BENICAR) 40 MG tablet   Other Relevant Orders   Fecal occult blood, imunochemical  Endocrine   Hypothyroidism   Relevant Medications   levothyroxine (SYNTHROID) 200 MCG tablet   Other Relevant Orders   Fecal occult blood, imunochemical     Other   Hyperlipidemia   Relevant Medications   amLODipine (NORVASC) 5 MG tablet   hydrochlorothiazide (HYDRODIURIL) 25 MG tablet   rosuvastatin (CRESTOR) 5 MG tablet   olmesartan (BENICAR) 40 MG tablet   Other Relevant Orders   Fecal occult blood, imunochemical    Other Visit Diagnoses    Essential hypertension    -  Primary   Relevant Medications   iron polysaccharides (FERREX 150) 150 MG capsule   amLODipine (NORVASC) 5 MG tablet   hydrochlorothiazide (HYDRODIURIL) 25 MG tablet   rosuvastatin (CRESTOR) 5 MG tablet   olmesartan (BENICAR) 40 MG tablet   Other Relevant Orders   Fecal occult blood, imunochemical   Other polyneuropathy       Relevant Orders   Fecal occult blood, imunochemical   TSH   Vitamin B12   Folate      Labs look good from October so only run the ones related to neuropathy, if all the labs are normal, it is likely that the back is causing his neuropathy problems and may consider spinal referral and again in the future. Follow up plan: Return in about 6 months (around 05/16/2020), or if symptoms worsen or fail to improve, for Hypertension and cholesterol and thyroid.  Counseling provided for all of the vaccine components Orders Placed This Encounter  Procedures  . Fecal occult  blood, imunochemical  . TSH  . Vitamin B12  . Folate    Caryl Pina, MD [facility] 11/17/2019, 2:36 PM

## 2019-12-04 NOTE — Telephone Encounter (Signed)
11/13/2019 APPT REMINDER

## 2019-12-15 ENCOUNTER — Ambulatory Visit: Payer: Medicare Other | Attending: Internal Medicine

## 2019-12-15 DIAGNOSIS — Z23 Encounter for immunization: Secondary | ICD-10-CM | POA: Insufficient documentation

## 2019-12-15 NOTE — Progress Notes (Signed)
   Covid-19 Vaccination Clinic  Name:  Troy Acosta    MRN: [id number] DOB: 07-29-1951  12/15/2019  Mr. [name] was observed post Covid-19 immunization for 15 minutes without incident. He was provided with Vaccine Information Sheet and instruction to access the V-Safe system.   Mr. [name] was instructed to call 911 with any severe reactions post vaccine: Marland Kitchen Difficulty breathing  . Swelling of face and throat  . A fast heartbeat  . A bad rash all over body  . Dizziness and weakness   Immunizations Administered    Name Date Dose VIS Date Route   Moderna COVID-19 Vaccine 12/15/2019 12:51 PM 0.5 mL 09/09/2019 Intramuscular   Manufacturer: Moderna   Lot: [id number]   [place]VO:[id number]

## 2020-05-10 DIAGNOSIS — J0100 Acute maxillary sinusitis, unspecified: Secondary | ICD-10-CM | POA: Diagnosis not present

## 2020-05-10 DIAGNOSIS — R05 Cough: Secondary | ICD-10-CM | POA: Diagnosis not present

## 2020-05-10 DIAGNOSIS — R0789 Other chest pain: Secondary | ICD-10-CM | POA: Diagnosis not present

## 2020-05-13 DIAGNOSIS — R05 Cough: Secondary | ICD-10-CM | POA: Diagnosis not present

## 2020-05-13 DIAGNOSIS — R062 Wheezing: Secondary | ICD-10-CM | POA: Diagnosis not present

## 2020-05-13 DIAGNOSIS — J9811 Atelectasis: Secondary | ICD-10-CM | POA: Diagnosis not present

## 2020-05-13 DIAGNOSIS — R0781 Pleurodynia: Secondary | ICD-10-CM | POA: Diagnosis not present

## 2020-05-13 DIAGNOSIS — R0989 Other specified symptoms and signs involving the circulatory and respiratory systems: Secondary | ICD-10-CM | POA: Diagnosis not present

## 2020-05-13 DIAGNOSIS — R0981 Nasal congestion: Secondary | ICD-10-CM | POA: Diagnosis not present

## 2020-05-14 ENCOUNTER — Telehealth: Payer: Self-pay | Admitting: Family Medicine

## 2020-05-14 NOTE — Telephone Encounter (Signed)
Yes that is correct symptomatic then he will have to do a virtual visit or reschedule for a later appointment.  If he is asymptomatic then he is fine coming in.

## 2020-05-14 NOTE — Telephone Encounter (Signed)
Called pt to confirm his appt with Dr Dettinger on Monday 05/17/20 for his 6 month check up. Pt said he would really like to still be able to come in for his appt but says he ended up having to go to the Urgent Care twice this week and is currently being treated for Pneumonia. Pt says they did test him for COVID at Urgent Care and his result was negative. Wants to know if he can keep his appt for Monday or does he have to reschedule?

## 2020-05-14 NOTE — Telephone Encounter (Signed)
Pt scheduled with Dr Darnell Level in the respiratory clinic on Monday and rescheduled his chronic follow up with Dr D 06/17/20.

## 2020-05-14 NOTE — Telephone Encounter (Signed)
Pt has appt with you 1:25 on Monday. If he still symptomatic he has to have virtual visit correct?

## 2020-05-17 ENCOUNTER — Ambulatory Visit (INDEPENDENT_AMBULATORY_CARE_PROVIDER_SITE_OTHER): Payer: Medicare Other

## 2020-05-17 ENCOUNTER — Other Ambulatory Visit: Payer: Self-pay

## 2020-05-17 ENCOUNTER — Encounter: Payer: Self-pay | Admitting: Family Medicine

## 2020-05-17 ENCOUNTER — Ambulatory Visit: Payer: Medicare Other | Admitting: Family Medicine

## 2020-05-17 ENCOUNTER — Ambulatory Visit (INDEPENDENT_AMBULATORY_CARE_PROVIDER_SITE_OTHER): Payer: Medicare Other | Admitting: Family Medicine

## 2020-05-17 VITALS — BP 143/89 | HR 82 | Temp 97.8°F

## 2020-05-17 DIAGNOSIS — J189 Pneumonia, unspecified organism: Secondary | ICD-10-CM | POA: Diagnosis not present

## 2020-05-17 DIAGNOSIS — R059 Cough, unspecified: Secondary | ICD-10-CM

## 2020-05-17 DIAGNOSIS — R05 Cough: Secondary | ICD-10-CM

## 2020-05-17 NOTE — Progress Notes (Signed)
Subjective: CC: cough PCP: Dettinger, [name], MD [id number] Potvin is a 69 y.o. male presenting to clinic today for:  1.  Cough Patient was seen in the urgent care on the second and fifth of the month.  Initially, he was treated with prednisone Dosepak, guaifenesin AC and a Z-Pak.  He followed up again because of left-sided rib pain, persistent wheezes and cough.  He had a chest x-ray which showed questionable bibasilar infiltrates.  He was switched to Levaquin 750 mg daily and also discharged with guaifenesin.    Of note he was tested for coronavirus on 05/10/2020 and this was a negative result.    He follows up today and notes that wheezing has improved.  Cough is improving but still quite prevalent.  He is monitoring home O2 and has been staying in the 90's.  No hemoptysis, SOB, edema, fevers.  Left rib pain present but improving as well.   ROS: Per HPI  No Known Allergies Past Medical History:  Diagnosis Date  . Anxiety   . Arthritis    "all my joints" (08/24/2017)  . Diaphragmatic hernia without mention of obstruction or gangrene   . Dysrhythmia    afib, ablation- 12/2015, no problems since, off blood thinner- 09/2016  . Essential hypertension, benign   . Headache    had MRI- was consulted with Dr. Jannifer Franklin,, headache has resolved"probably related to caffeine"  . Hyperplasia of prostate   . Iron deficiency anemia, unspecified   . Malaise and fatigue   . Obesity   . Other and unspecified hyperlipidemia   . Paroxysmal atrial fibrillation (HCC)   . PONV (postoperative nausea and vomiting)   . Unspecified hypothyroidism   . Wears dentures     Current Outpatient Medications:  .  acetaminophen (TYLENOL) 500 MG tablet, Take 500 mg by mouth 3 (three) times daily., Disp: , Rfl:  .  allopurinol (ZYLOPRIM) 100 MG tablet, Take 2 tablets (200 mg total) by mouth daily., Disp: 180 tablet, Rfl: 3 .  amLODipine (NORVASC) 5 MG tablet, Take 1 tablet (5 mg total) by mouth daily., Disp: 90  tablet, Rfl: 3 .  aspirin EC 81 MG tablet, Take 81 mg by mouth daily., Disp: , Rfl:  .  Cholecalciferol 2000 units CAPS, Take 2,000 Units by mouth every evening., Disp: , Rfl:  .  hydrochlorothiazide (HYDRODIURIL) 25 MG tablet, Take 1 tablet (25 mg total) by mouth daily., Disp: 90 tablet, Rfl: 3 .  iron polysaccharides (FERREX 150) 150 MG capsule, Take 1 capsule (150 mg total) by mouth daily., Disp: 90 capsule, Rfl: 3 .  levothyroxine (SYNTHROID) 200 MCG tablet, Take 1 tablet (200 mcg total) by mouth daily before breakfast., Disp: 90 tablet, Rfl: 3 .  olmesartan (BENICAR) 40 MG tablet, Take 0.5 tablets (20 mg total) by mouth 2 (two) times daily., Disp: 180 tablet, Rfl: 3 .  rosuvastatin (CRESTOR) 5 MG tablet, Take 1 tablet (5 mg total) by mouth daily., Disp: 90 tablet, Rfl: 3 No current facility-administered medications for this visit.  Facility-Administered Medications Ordered in Other Visits:  .  gadopentetate dimeglumine (MAGNEVIST) injection 20 mL, 20 mL, Intravenous, Once PRN, Kathrynn Ducking, MD Social History   Socioeconomic History  . Marital status: Married    Spouse name: Not on file  . Number of children: 0  . Years of education: 77  . Highest education level: Not on file  Occupational History  . Occupation: Programmer, systems: [employer]  Tobacco Use  .  Smoking status: Former Smoker    Years: 22.00    Types: Cigars    Quit date: 12/06/1992    Years since quitting: 27.4  . Smokeless tobacco: Never Used  Vaping Use  . Vaping Use: Never used  Substance and Sexual Activity  . Alcohol use: No  . Drug use: No  . Sexual activity: Not Currently    Partners: Female  Other Topics Concern  . Not on file  Social History Narrative   Lives at home w/ his wife   Left-handed   Caffeine: 1 cup coffee per day   Social Determinants of Health   Financial Resource Strain:   . Difficulty of Paying Living Expenses:   Food Insecurity:   . Worried About Sales executive in the Last Year:   . Arboriculturist in the Last Year:   Transportation Needs:   . Film/video editor (Medical):   Marland Kitchen Lack of Transportation (Non-Medical):   Physical Activity:   . Days of Exercise per Week:   . Minutes of Exercise per Session:   Stress:   . Feeling of Stress :   Social Connections:   . Frequency of Communication with Friends and Family:   . Frequency of Social Gatherings with Friends and Family:   . Attends Religious Services:   . Active Member of Clubs or Organizations:   . Attends Archivist Meetings:   Marland Kitchen Marital Status:   Intimate Partner Violence:   . Fear of Current or Ex-Partner:   . Emotionally Abused:   Marland Kitchen Physically Abused:   . Sexually Abused:    Family History  Problem Relation Age of Onset  . Heart failure Mother   . Hypertension Mother   . Diabetes Mother   . Lung cancer Father   . COPD Father   . Tuberous sclerosis Father   . Heart disease Sister   . Cancer Brother   . Hypertension Sister   . Thyroid disease Sister   . Crohn's disease Sister   . Lung cancer Paternal Grandfather     Objective: Office vital signs reviewed. BP (!) 143/89   Pulse 82   Temp 97.8 F (36.6 C)   SpO2 99%   Physical Examination:  General: Awake, alert, No acute distress. nontoxic HEENT: Normal, sclera white. MMM. Cardio: regular rate and rhythm, S1S2 heard, no murmurs appreciated Pulm: no coughing observed during exam.  clear to auscultation bilaterally, no wheezes, rhonchi or rales; normal work of breathing on room air MSK: normal gait and station  Assessment/ Plan: 69 y.o. male   1. Cough ?Viral.  I reviewed his notes and CXR.  Bibasilar changes noted but no discrete pulmonary infiltrates.  CXR was repeated today.  He does have some haziness in the right lower lobe.  Awaiting formal review by radiology.  COVID19 testing repeated.  Continue current medications since he is improving.  We discussed red flags warranting further  evaluation.  He voiced good understanding and will follow up prn - DG Chest 2 View; Future - Novel Coronavirus, NAA ([facility])   No orders of the defined types were placed in this encounter.  No orders of the defined types were placed in this encounter.    Janora Norlander, DO [place] [phone]

## 2020-05-17 NOTE — Patient Instructions (Signed)
Chest xray looks relatively similar to previous exam.  I agree with current antibiotic regimen.  Do the lung exercises we talked about.  I will contact you as soon as the radiologist formally reads the xray   Community-Acquired Pneumonia, Adult Pneumonia is an infection of the lungs. It causes swelling in the airways of the lungs. Mucus and fluid may also build up inside the airways. One type of pneumonia can happen while a person is in a hospital. A different type can happen when a person is not in a hospital (community-acquired pneumonia).  What are the causes?  This condition is caused by germs (viruses, bacteria, or fungi). Some types of germs can be passed from one person to another. This can happen when you breathe in droplets from the cough or sneeze of an infected person. What increases the risk? You are more likely to develop this condition if you:  Have a long-term (chronic) disease, such as: ? Chronic obstructive pulmonary disease (COPD). ? Asthma. ? Cystic fibrosis. ? Congestive heart failure. ? Diabetes. ? Kidney disease.  Have HIV.  Have sickle cell disease.  Have had your spleen removed.  Do not take good care of your teeth and mouth (poor dental hygiene).  Have a medical condition that increases the risk of breathing in droplets from your own mouth and nose.  Have a weakened body defense system (immune system).  Are a smoker.  Travel to areas where the germs that cause this illness are common.  Are around certain animals or the places they live. What are the signs or symptoms?  A dry cough.  A wet (productive) cough.  Fever.  Sweating.  Chest pain. This often happens when breathing deeply or coughing.  Fast breathing or trouble breathing.  Shortness of breath.  Shaking chills.  Feeling tired (fatigue).  Muscle aches. How is this treated? Treatment for this condition depends on many things. Most adults can be treated at home. In some cases,  treatment must happen in a hospital. Treatment may include:  Medicines given by mouth or through an IV tube.  Being given extra oxygen.  Respiratory therapy. In rare cases, treatment for very bad pneumonia may include:  Using a machine to help you breathe.  Having a procedure to remove fluid from around your lungs. Follow these instructions at home: Medicines  Take over-the-counter and prescription medicines only as told by your doctor. ? Only take cough medicine if you are losing sleep.  If you were prescribed an antibiotic medicine, take it as told by your doctor. Do not stop taking the antibiotic even if you start to feel better. General instructions   Sleep with your head and neck raised (elevated). You can do this by sleeping in a recliner or by putting a few pillows under your head.  Rest as needed. Get at least 8 hours of sleep each night.  Drink enough water to keep your pee (urine) pale yellow.  Eat a healthy diet that includes plenty of vegetables, fruits, whole grains, low-fat dairy products, and lean protein.  Do not use any products that contain nicotine or tobacco. These include cigarettes, e-cigarettes, and chewing tobacco. If you need help quitting, ask your doctor.  Keep all follow-up visits as told by your doctor. This is important. How is this prevented? A shot (vaccine) can help prevent pneumonia. Shots are often suggested for:  People older than 67 years of age.  People older than 67 years of age who: ? Are having cancer  treatment. ? Have long-term (chronic) lung disease. ? Have problems with their body's defense system. You may also prevent pneumonia if you take these actions:  Get the flu (influenza) shot every year.  Go to the dentist as often as told.  Wash your hands often. If you cannot use soap and water, use hand sanitizer. Contact a doctor if:  You have a fever.  You lose sleep because your cough medicine does not help. Get help  right away if:  You are short of breath and it gets worse.  You have more chest pain.  Your sickness gets worse. This is very serious if: ? You are an older adult. ? Your body's defense system is weak.  You cough up blood. Summary  Pneumonia is an infection of the lungs.  Most adults can be treated at home. Some will need treatment in a hospital.  Drink enough water to keep your pee pale yellow.  Get at least 8 hours of sleep each night. This information is not intended to replace advice given to you by your health care provider. Make sure you discuss any questions you have with your health care provider. Document Revised: 01/15/2019 Document Reviewed: 05/23/2018 Elsevier Patient Education  [place].

## 2020-05-18 LAB — NOVEL CORONAVIRUS, NAA: SARS-CoV-2, NAA: NOT DETECTED

## 2020-05-18 LAB — SARS-COV-2, NAA 2 DAY TAT

## 2020-06-17 ENCOUNTER — Other Ambulatory Visit: Payer: Self-pay

## 2020-06-17 ENCOUNTER — Encounter: Payer: Self-pay | Admitting: Family Medicine

## 2020-06-17 ENCOUNTER — Ambulatory Visit (INDEPENDENT_AMBULATORY_CARE_PROVIDER_SITE_OTHER): Payer: Medicare Other | Admitting: Family Medicine

## 2020-06-17 VITALS — BP 125/68 | HR 65 | Temp 98.0°F | Ht 71.0 in | Wt 254.0 lb

## 2020-06-17 DIAGNOSIS — E7849 Other hyperlipidemia: Secondary | ICD-10-CM | POA: Diagnosis not present

## 2020-06-17 DIAGNOSIS — N400 Enlarged prostate without lower urinary tract symptoms: Secondary | ICD-10-CM

## 2020-06-17 DIAGNOSIS — I10 Essential (primary) hypertension: Secondary | ICD-10-CM

## 2020-06-17 DIAGNOSIS — E038 Other specified hypothyroidism: Secondary | ICD-10-CM

## 2020-06-17 NOTE — Progress Notes (Signed)
BP 125/68   Pulse 65   Temp 98 F (36.7 C)   Ht '5\' 11"'  (1.803 m)   Wt 254 lb (115.2 kg)   SpO2 97%   BMI 35.43 kg/m    Subjective:   Patient ID: Troy Acosta, male    DOB: 18-Mar-1951, 69 y.o.   MRN: [id number]  HPI: Fintan Grater is a 69 y.o. male presenting on 06/17/2020 for Medical Management of Chronic Issues, Hypertension, and Hypothyroidism   HPI Hypothyroidism recheck Patient is coming in for thyroid recheck today as well. They deny any issues with hair changes or heat or cold problems or diarrhea or constipation. They deny any chest pain or palpitations. They are currently on levothyroxine 200 micrograms   Hypertension Patient is currently on hydrochlorothiazide and amlodipine and olmesartan, and their blood pressure today is 125/68. Patient denies any lightheadedness or dizziness. Patient denies headaches, blurred vision, chest pains, shortness of breath, or weakness. Denies any side effects from medication and is content with current medication.   Hyperlipidemia Patient is coming in for recheck of his hyperlipidemia. The patient is currently taking Crestor. They deny any issues with myalgias or history of liver damage from it. They deny any focal numbness or weakness or chest pain.   BPH Patient is coming in for recheck on BPH Symptoms: None currently Medication: None Last PSA: 1 year ago  Relevant past medical, surgical, family and social history reviewed and updated as indicated. Interim medical history since our last visit reviewed. Allergies and medications reviewed and updated.  Review of Systems  Constitutional: Negative for chills and fever.  Respiratory: Negative for shortness of breath and wheezing.   Cardiovascular: Negative for chest pain and leg swelling.  Musculoskeletal: Negative for back pain and gait problem.  Skin: Negative for rash.  Neurological: Negative for dizziness, weakness and light-headedness.  All other systems reviewed and are  negative.   Per HPI unless specifically indicated above   Allergies as of 06/17/2020   No Known Allergies     Medication List       Accurate as of June 17, 2020 11:40 AM. If you have any questions, ask your nurse or doctor.        STOP taking these medications   azithromycin 250 MG tablet Commonly known as: ZITHROMAX Stopped by: [name] Markeise Mathews, MD   guaiFENesin-codeine 100-10 MG/5ML syrup Commonly known as: ROBITUSSIN AC Stopped by: [name] Kable Haywood, MD   Mucinex 600 MG 12 hr tablet Generic drug: guaiFENesin Stopped by: [name] Camil Wilhelmsen, MD     TAKE these medications   acetaminophen 500 MG tablet Commonly known as: TYLENOL Take 500 mg by mouth 3 (three) times daily.   allopurinol 100 MG tablet Commonly known as: ZYLOPRIM Take 2 tablets (200 mg total) by mouth daily.   amLODipine 5 MG tablet Commonly known as: NORVASC Take 1 tablet (5 mg total) by mouth daily.   aspirin EC 81 MG tablet Take 81 mg by mouth daily.   Cholecalciferol 50 MCG (2000 UT) Caps Take 2,000 Units by mouth every evening.   hydrochlorothiazide 25 MG tablet Commonly known as: HYDRODIURIL Take 1 tablet (25 mg total) by mouth daily.   iron polysaccharides 150 MG capsule Commonly known as: Ferrex 150 Take 1 capsule (150 mg total) by mouth daily.   levothyroxine 200 MCG tablet Commonly known as: SYNTHROID Take 1 tablet (200 mcg total) by mouth daily before breakfast.   olmesartan 40 MG tablet Commonly known as: BENICAR Take 0.5  tablets (20 mg total) by mouth 2 (two) times daily.   rosuvastatin 5 MG tablet Commonly known as: CRESTOR Take 1 tablet (5 mg total) by mouth daily.        Objective:   BP 125/68   Pulse 65   Temp 98 F (36.7 C)   Ht '5\' 11"'  (1.803 m)   Wt 254 lb (115.2 kg)   SpO2 97%   BMI 35.43 kg/m   Wt Readings from Last 3 Encounters:  06/17/20 254 lb (115.2 kg)  11/17/19 258 lb 3.2 oz (117.1 kg)  11/14/19 259 lb (117.5 kg)    Physical  Exam Vitals and nursing note reviewed.  Constitutional:      General: He is not in acute distress.    Appearance: He is well-developed. He is not diaphoretic.  Eyes:     General: No scleral icterus.    Conjunctiva/sclera: Conjunctivae normal.  Neck:     Thyroid: No thyromegaly.  Cardiovascular:     Rate and Rhythm: Normal rate and regular rhythm.     Heart sounds: Normal heart sounds. No murmur heard.   Pulmonary:     Effort: Pulmonary effort is normal. No respiratory distress.     Breath sounds: Normal breath sounds. No wheezing.  Musculoskeletal:        General: Normal range of motion.     Cervical back: Neck supple.  Lymphadenopathy:     Cervical: No cervical adenopathy.  Skin:    General: Skin is warm and dry.     Findings: No rash.  Neurological:     Mental Status: He is alert and oriented to person, place, and time.     Coordination: Coordination normal.  Psychiatric:        Behavior: Behavior normal.       Assessment & Plan:   Problem List Items Addressed This Visit      Cardiovascular and Mediastinum   Essential hypertension, benign   Relevant Orders   CMP14+EGFR (Completed)     Endocrine   Hypothyroidism - Primary   Relevant Orders   CBC with Differential/Platelet (Completed)   TSH (Completed)     Genitourinary   BPH (benign prostatic hyperplasia)   Relevant Orders   PSA, total and free (Completed)     Other   Hyperlipidemia   Relevant Orders   Lipid panel (Completed)    Continue current medication, no changes.  Seems to be doing well.  Follow up plan: Return in about 6 months (around 12/15/2020), or if symptoms worsen or fail to improve, for htn and thyroid.  Counseling provided for all of the vaccine components No orders of the defined types were placed in this encounter.   Caryl Pina, MD [facility] 06/17/2020, 11:40 AM

## 2020-06-18 LAB — CMP14+EGFR
ALT: 17 IU/L (ref 0–44)
AST: 19 IU/L (ref 0–40)
Albumin/Globulin Ratio: 1.4 (ref 1.2–2.2)
Albumin: 4.4 g/dL (ref 3.8–4.8)
Alkaline Phosphatase: 83 IU/L (ref 48–121)
BUN/Creatinine Ratio: 22 (ref 10–24)
BUN: 21 mg/dL (ref 8–27)
Bilirubin Total: 0.7 mg/dL (ref 0.0–1.2)
CO2: 26 mmol/L (ref 20–29)
Calcium: 9.4 mg/dL (ref 8.6–10.2)
Chloride: 99 mmol/L (ref 96–106)
Creatinine, Ser: 0.97 mg/dL (ref 0.76–1.27)
GFR calc Af Amer: 94 mL/min/{1.73_m2} (ref >59)
GFR calc non Af Amer: 81 mL/min/{1.73_m2} (ref >59)
Globulin, Total: 3.1 g/dL (ref 1.5–4.5)
Glucose: 87 mg/dL (ref 65–99)
Potassium: 4.2 mmol/L (ref 3.5–5.2)
Sodium: 139 mmol/L (ref 134–144)
Total Protein: 7.5 g/dL (ref 6.0–8.5)

## 2020-06-18 LAB — CBC WITH DIFFERENTIAL/PLATELET
Basophils Absolute: 0 10*3/uL (ref 0.0–0.2)
Basos: 0 %
EOS (ABSOLUTE): 0.1 10*3/uL (ref 0.0–0.4)
Eos: 1 %
Hematocrit: 43.2 % (ref 37.5–51.0)
Hemoglobin: 14.9 g/dL (ref 13.0–17.7)
Immature Grans (Abs): 0 10*3/uL (ref 0.0–0.1)
Immature Granulocytes: 0 %
Lymphocytes Absolute: 2.4 10*3/uL (ref 0.7–3.1)
Lymphs: 24 %
MCH: 29.7 pg (ref 26.6–33.0)
MCHC: 34.5 g/dL (ref 31.5–35.7)
MCV: 86 fL (ref 79–97)
Monocytes Absolute: 0.7 10*3/uL (ref 0.1–0.9)
Monocytes: 7 %
Neutrophils Absolute: 6.8 10*3/uL (ref 1.4–7.0)
Neutrophils: 68 %
Platelets: 245 10*3/uL (ref 150–450)
RBC: 5.01 x10E6/uL (ref 4.14–5.80)
RDW: 13.2 % (ref 11.6–15.4)
WBC: 10 10*3/uL (ref 3.4–10.8)

## 2020-06-18 LAB — LIPID PANEL
Chol/HDL Ratio: 3.6 ratio (ref 0.0–5.0)
Cholesterol, Total: 114 mg/dL (ref 100–199)
HDL: 32 mg/dL — ABNORMAL LOW (ref >39)
LDL Chol Calc (NIH): 61 mg/dL (ref 0–99)
Triglycerides: 113 mg/dL (ref 0–149)
VLDL Cholesterol Cal: 21 mg/dL (ref 5–40)

## 2020-06-18 LAB — PSA, TOTAL AND FREE
PSA, Free Pct: 18 %
PSA, Free: 0.36 ng/mL
Prostate Specific Ag, Serum: 2 ng/mL (ref 0.0–4.0)

## 2020-06-18 LAB — TSH: TSH: 1.59 u[IU]/mL (ref 0.450–4.500)

## 2020-09-16 ENCOUNTER — Other Ambulatory Visit: Payer: Self-pay | Admitting: Family Medicine

## 2020-09-16 DIAGNOSIS — I10 Essential (primary) hypertension: Secondary | ICD-10-CM

## 2020-10-28 ENCOUNTER — Telehealth: Payer: Self-pay | Admitting: *Deleted

## 2020-10-28 DIAGNOSIS — R600 Localized edema: Secondary | ICD-10-CM | POA: Diagnosis not present

## 2020-10-28 DIAGNOSIS — L03116 Cellulitis of left lower limb: Secondary | ICD-10-CM | POA: Diagnosis not present

## 2020-10-28 NOTE — Telephone Encounter (Signed)
Patient's wife called office stating that patient began to have pain in left ankle radiating up to left knee with redness and warmth.  Wife is concerned about a blood clot.  No available appts at this office until 1/24.  Advised wife that she may take patient to Urgent Care or ER to be evaluated.

## 2020-11-12 ENCOUNTER — Encounter: Payer: Self-pay | Admitting: Internal Medicine

## 2020-11-12 ENCOUNTER — Ambulatory Visit: Payer: Medicare Other | Admitting: Internal Medicine

## 2020-11-12 VITALS — BP 148/90 | HR 64 | Ht 71.0 in | Wt 262.6 lb

## 2020-11-12 DIAGNOSIS — I480 Paroxysmal atrial fibrillation: Secondary | ICD-10-CM | POA: Diagnosis not present

## 2020-11-12 DIAGNOSIS — I10 Essential (primary) hypertension: Secondary | ICD-10-CM | POA: Diagnosis not present

## 2020-11-12 NOTE — Progress Notes (Signed)
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 male who presents today for routine electrophysiology followup.  Since last being seen in our clinic, the patient reports doing very well.  Today, he denies symptoms of palpitations, chest pain, shortness of breath,  lower extremity edema, dizziness, presyncope, or syncope.  The patient is otherwise without complaint today.   Past Medical History:  Diagnosis Date  . Anxiety   . Arthritis    "all my joints" (08/24/2017)  . Diaphragmatic hernia without mention of obstruction or gangrene   . Dysrhythmia    afib, ablation- 12/2015, no problems since, off blood thinner- 09/2016  . Essential hypertension, benign   . Headache    had MRI- was consulted with Dr. Jannifer Franklin,, headache has resolved"probably related to caffeine"  . Hyperplasia of prostate   . Iron deficiency anemia, unspecified   . Malaise and fatigue   . Obesity   . Other and unspecified hyperlipidemia   . Paroxysmal atrial fibrillation (HCC)   . PONV (postoperative nausea and vomiting)   . Unspecified hypothyroidism   . Wears dentures    Past Surgical History:  Procedure Laterality Date  . COLONOSCOPY WITH ESOPHAGOGASTRODUODENOSCOPY (EGD)    . ELECTROPHYSIOLOGIC STUDY N/A 12/24/2015   Afib ablation by Dr Rayann Heman  . JOINT REPLACEMENT    . KNEE ARTHROSCOPY Right ~ 2016  . LUNG BIOPSY  1990s   "no problems identified"  . MULTIPLE TOOTH EXTRACTIONS    . TOTAL KNEE ARTHROPLASTY Right 03/02/2017   Procedure: RIGHT TOTAL KNEE ARTHROPLASTY;  Surgeon: [name], MD;  Location: [facility];  Service: Orthopedics;  Laterality: Right;  . TOTAL KNEE ARTHROPLASTY Left 08/24/2017  . TOTAL KNEE ARTHROPLASTY Left 08/24/2017   Procedure: LEFT TOTAL KNEE ARTHROPLASTY;  Surgeon: [name], MD;  Location: [facility];  Service: Orthopedics;  Laterality: Left;    ROS- all systems are reviewed and negatives except as per HPI above  Current Outpatient Medications   Medication Sig Dispense Refill  . acetaminophen (TYLENOL) 500 MG tablet Take 500 mg by mouth 3 (three) times daily.    Marland Kitchen allopurinol (ZYLOPRIM) 100 MG tablet TAKE 2 TABLETS BY MOUTH  DAILY 180 tablet 0  . amLODipine (NORVASC) 5 MG tablet TAKE 1 TABLET BY MOUTH  DAILY 90 tablet 0  . aspirin EC 81 MG tablet Take 81 mg by mouth daily.    . hydrochlorothiazide (HYDRODIURIL) 25 MG tablet TAKE 1 TABLET BY MOUTH  DAILY 90 tablet 0  . iron polysaccharides (FERREX 150) 150 MG capsule Take 1 capsule (150 mg total) by mouth daily. 90 capsule 3  . levothyroxine (SYNTHROID) 200 MCG tablet TAKE 1 TABLET BY MOUTH  DAILY BEFORE BREAKFAST 90 tablet 0  . olmesartan (BENICAR) 40 MG tablet Take 0.5 tablets (20 mg total) by mouth 2 (two) times daily. 180 tablet 3  . rosuvastatin (CRESTOR) 5 MG tablet TAKE 1 TABLET BY MOUTH  DAILY 90 tablet 0   No current facility-administered medications for this visit.   Facility-Administered Medications Ordered in Other Visits  Medication Dose Route Frequency Provider Last Rate Last Admin  . gadopentetate dimeglumine (MAGNEVIST) injection 20 mL  20 mL Intravenous Once PRN Kathrynn Ducking, MD        Physical Exam: Vitals:   11/12/20 0857  BP: (!) 148/90  Pulse: 64  SpO2: 98%  Weight: 262 lb 9.6 oz (119.1 kg)  Height: 5\' 11"  (1.803 m)    GEN- The  patient is well appearing, alert and oriented x 3 today.   Head- normocephalic, atraumatic Eyes-  Sclera clear, conjunctiva pink Ears- hearing intact Oropharynx- clear Lungs- Clear to ausculation bilaterally, normal work of breathing Heart- Regular rate and rhythm, no murmurs, rubs or gallops, PMI not laterally displaced GI- soft, NT, ND, + BS Extremities- no clubbing, cyanosis, or edema  Wt Readings from Last 3 Encounters:  11/12/20 262 lb 9.6 oz (119.1 kg)  06/17/20 254 lb (115.2 kg)  11/17/19 258 lb 3.2 oz (117.1 kg)    EKG tracing ordered today is personally reviewed and shows sinus rhythm  Assessment and  Plan:  1. Paroxysmal atrial fibrillation He has done very well post ablation off AAD therapy chads2vasc score is 2.  He has declined [place]  2. HTN Stable No change required today  3. Overweight Body mass index is 36.63 kg/m. Lifestyle modification advised  4. Tachycardia mediated CM Resolved We will repeat echo at this time   Risks, benefits and potential toxicities for medications prescribed and/or refilled reviewed with patient today.   Return in a year  [name] MD, [facility] 11/12/2020 9:19 AM

## 2020-11-12 NOTE — Patient Instructions (Signed)
Medication Instructions:  Continue all current medications.  Labwork: none  Testing/Procedures: Your physician has requested that you have an echocardiogram. Echocardiography is a painless test that uses sound waves to create images of your heart. It provides your doctor with information about the size and shape of your heart and how well your heart's chambers and valves are working. This procedure takes approximately one hour. There are no restrictions for this procedure. Office will contact with results via phone or letter.    Follow-Up: 1 year   Any Other Special Instructions Will Be Listed Below (If Applicable).  If you need a refill on your cardiac medications before your next appointment, please call your pharmacy.  

## 2020-11-17 ENCOUNTER — Other Ambulatory Visit: Payer: Self-pay | Admitting: Internal Medicine

## 2020-11-17 DIAGNOSIS — I480 Paroxysmal atrial fibrillation: Secondary | ICD-10-CM

## 2020-11-25 DIAGNOSIS — H40013 Open angle with borderline findings, low risk, bilateral: Secondary | ICD-10-CM | POA: Diagnosis not present

## 2020-11-25 DIAGNOSIS — H2513 Age-related nuclear cataract, bilateral: Secondary | ICD-10-CM | POA: Diagnosis not present

## 2020-11-25 DIAGNOSIS — H25013 Cortical age-related cataract, bilateral: Secondary | ICD-10-CM | POA: Diagnosis not present

## 2020-11-25 DIAGNOSIS — H524 Presbyopia: Secondary | ICD-10-CM | POA: Diagnosis not present

## 2020-11-25 DIAGNOSIS — H35033 Hypertensive retinopathy, bilateral: Secondary | ICD-10-CM | POA: Diagnosis not present

## 2020-11-25 LAB — HM DIABETES EYE EXAM

## 2020-11-30 ENCOUNTER — Other Ambulatory Visit: Payer: Self-pay | Admitting: Family Medicine

## 2020-11-30 DIAGNOSIS — I10 Essential (primary) hypertension: Secondary | ICD-10-CM

## 2020-12-02 ENCOUNTER — Ambulatory Visit (INDEPENDENT_AMBULATORY_CARE_PROVIDER_SITE_OTHER): Payer: Medicare Other

## 2020-12-02 DIAGNOSIS — I480 Paroxysmal atrial fibrillation: Secondary | ICD-10-CM

## 2020-12-02 LAB — ECHOCARDIOGRAM COMPLETE
Area-P 1/2: 3.02 cm2
Calc EF: 64.5 %
S' Lateral: 3.79 cm
Single Plane A2C EF: 66.8 %
Single Plane A4C EF: 61.3 %

## 2020-12-04 ENCOUNTER — Other Ambulatory Visit: Payer: Self-pay | Admitting: Family Medicine

## 2020-12-04 DIAGNOSIS — I10 Essential (primary) hypertension: Secondary | ICD-10-CM

## 2020-12-07 ENCOUNTER — Telehealth: Payer: Self-pay | Admitting: Internal Medicine

## 2020-12-07 NOTE — Telephone Encounter (Signed)
Patient returning call in regards to echo results.

## 2020-12-07 NOTE — Telephone Encounter (Signed)
The patient has been notified of the result and verbalized understanding.  All questions (if any) were answered. Darrell Jewel, RN 12/07/2020 5:04 PM

## 2020-12-15 ENCOUNTER — Encounter: Payer: Self-pay | Admitting: Family Medicine

## 2020-12-15 ENCOUNTER — Ambulatory Visit (INDEPENDENT_AMBULATORY_CARE_PROVIDER_SITE_OTHER): Payer: Medicare Other | Admitting: Family Medicine

## 2020-12-15 ENCOUNTER — Other Ambulatory Visit: Payer: Self-pay

## 2020-12-15 VITALS — BP 120/68 | HR 67 | Ht 71.0 in | Wt 256.0 lb

## 2020-12-15 DIAGNOSIS — E038 Other specified hypothyroidism: Secondary | ICD-10-CM

## 2020-12-15 DIAGNOSIS — I10 Essential (primary) hypertension: Secondary | ICD-10-CM | POA: Diagnosis not present

## 2020-12-15 DIAGNOSIS — Z23 Encounter for immunization: Secondary | ICD-10-CM | POA: Diagnosis not present

## 2020-12-15 DIAGNOSIS — D509 Iron deficiency anemia, unspecified: Secondary | ICD-10-CM | POA: Diagnosis not present

## 2020-12-15 DIAGNOSIS — E7849 Other hyperlipidemia: Secondary | ICD-10-CM | POA: Diagnosis not present

## 2020-12-15 LAB — URINALYSIS, COMPLETE
Bilirubin, UA: NEGATIVE
Glucose, UA: NEGATIVE
Ketones, UA: NEGATIVE
Leukocytes,UA: NEGATIVE
Nitrite, UA: NEGATIVE
Protein,UA: NEGATIVE
RBC, UA: NEGATIVE
Specific Gravity, UA: 1.02 (ref 1.005–1.030)
Urobilinogen, Ur: 0.2 mg/dL (ref 0.2–1.0)
pH, UA: 5 (ref 5.0–7.5)

## 2020-12-15 LAB — MICROSCOPIC EXAMINATION
Epithelial Cells (non renal): NONE SEEN /hpf (ref 0–10)
RBC, Urine: NONE SEEN /hpf (ref 0–2)
WBC, UA: NONE SEEN /hpf (ref 0–5)

## 2020-12-15 MED ORDER — ROSUVASTATIN CALCIUM 5 MG PO TABS: 5.0000 mg | ORAL_TABLET | Freq: Every day | ORAL | 3 refills | Status: DC

## 2020-12-15 MED ORDER — OLMESARTAN MEDOXOMIL 40 MG PO TABS: 20.0000 mg | ORAL_TABLET | Freq: Two times a day (BID) | ORAL | 3 refills | Status: DC

## 2020-12-15 MED ORDER — AMLODIPINE BESYLATE 5 MG PO TABS: 5.0000 mg | ORAL_TABLET | Freq: Every day | ORAL | 3 refills | Status: DC

## 2020-12-15 MED ORDER — POLYSACCHARIDE IRON COMPLEX 150 MG PO CAPS: 150.0000 mg | ORAL_CAPSULE | Freq: Every day | ORAL | 3 refills | Status: DC

## 2020-12-15 MED ORDER — LEVOTHYROXINE SODIUM 200 MCG PO TABS: 200.0000 ug | ORAL_TABLET | Freq: Every day | ORAL | 3 refills | Status: DC

## 2020-12-15 MED ORDER — HYDROCHLOROTHIAZIDE 25 MG PO TABS: 25.0000 mg | ORAL_TABLET | Freq: Every day | ORAL | 3 refills | Status: DC

## 2020-12-15 MED ORDER — ALLOPURINOL 100 MG PO TABS: 200.0000 mg | ORAL_TABLET | Freq: Every day | ORAL | 3 refills | Status: DC

## 2020-12-15 NOTE — Progress Notes (Signed)
BP 120/68   Pulse 67   Ht _0  (1.803 m)   Wt 256 lb (116.1 kg)   SpO2 97%   BMI 35.70 kg/m    Subjective:   Patient ID: Troy Acosta, male    DOB: June 03, 1951, 70 y.o.   MRN: [id number]  HPI: Troy Acosta is a 70 y.o. male presenting on 12/15/2020 for Medical Management of Chronic Issues, Hypertension, and Hypothyroidism   HPI Hypertension Patient is currently on amlodipine and hydrochlorothiazide and Benicar, and their blood pressure today is 120/68. Patient denies any lightheadedness or dizziness. Patient denies headaches, blurred vision, chest pains, shortness of breath, or weakness. Denies any side effects from medication and is content with current medication.   Hypothyroidism recheck Patient is coming in for thyroid recheck today as well. They deny any issues with hair changes or heat or cold problems or diarrhea or constipation. They deny any chest pain or palpitations. They are currently on levothyroxine 200 micrograms   Hyperlipidemia Patient is coming in for recheck of his hyperlipidemia. The patient is currently taking Crestor. They deny any issues with myalgias or history of liver damage from it. They deny any focal numbness or weakness or chest pain.   Patient is coming in for recheck on iron deficiency anemia as well.  He denies any lightheadedness or fatigue or bleeding.  Relevant past medical, surgical, family and social history reviewed and updated as indicated. Interim medical history since our last visit reviewed. Allergies and medications reviewed and updated.  Review of Systems  Constitutional: Negative for chills and fever.  Eyes: Negative for visual disturbance.  Respiratory: Negative for shortness of breath and wheezing.   Cardiovascular: Negative for chest pain and leg swelling.  Musculoskeletal: Negative for back pain and gait problem.  Skin: Negative for rash.  Neurological: Negative for dizziness, weakness and light-headedness.  All other systems  reviewed and are negative.   Per HPI unless specifically indicated above   Allergies as of 12/15/2020   No Known Allergies     Medication List       Accurate as of December 15, 2020  1:46 PM. If you have any questions, ask your nurse or doctor.        acetaminophen 500 MG tablet Commonly known as: TYLENOL Take 500 mg by mouth 3 (three) times daily.   allopurinol 100 MG tablet Commonly known as: ZYLOPRIM TAKE 2 TABLETS BY MOUTH  DAILY   amLODipine 5 MG tablet Commonly known as: NORVASC TAKE 1 TABLET BY MOUTH  DAILY   aspirin EC 81 MG tablet Take 81 mg by mouth daily.   Ferrex 150 150 MG capsule Generic drug: iron polysaccharides Take 1 capsule by mouth once daily   hydrochlorothiazide 25 MG tablet Commonly known as: HYDRODIURIL TAKE 1 TABLET BY MOUTH  DAILY   levothyroxine 200 MCG tablet Commonly known as: SYNTHROID TAKE 1 TABLET BY MOUTH  DAILY BEFORE BREAKFAST   olmesartan 40 MG tablet Commonly known as: BENICAR TAKE ONE-HALF TABLET BY  MOUTH TWICE DAILY   rosuvastatin 5 MG tablet Commonly known as: CRESTOR TAKE 1 TABLET BY MOUTH  DAILY        Objective:   BP 120/68   Pulse 67   Ht _1  (1.803 m)   Wt 256 lb (116.1 kg)   SpO2 97%   BMI 35.70 kg/m   Wt Readings from Last 3 Encounters:  12/15/20 256 lb (116.1 kg)  11/12/20 262 lb 9.6 oz (119.1 kg)  06/17/20 254  lb (115.2 kg)    Physical Exam Vitals and nursing note reviewed.  Constitutional:      General: He is not in acute distress.    Appearance: He is well-developed and well-nourished. He is not diaphoretic.  Eyes:     General: No scleral icterus.    Extraocular Movements: EOM normal.     Conjunctiva/sclera: Conjunctivae normal.  Neck:     Thyroid: No thyromegaly.  Cardiovascular:     Rate and Rhythm: Normal rate and regular rhythm.     Pulses: Intact distal pulses.     Heart sounds: Normal heart sounds. No murmur heard.   Pulmonary:     Effort: Pulmonary effort is normal. No  respiratory distress.     Breath sounds: Normal breath sounds. No wheezing.  Musculoskeletal:        General: No swelling or edema.     Cervical back: Neck supple.  Lymphadenopathy:     Cervical: No cervical adenopathy.  Skin:    General: Skin is warm and dry.     Findings: No rash.  Neurological:     Mental Status: He is alert and oriented to person, place, and time.     Coordination: Coordination normal.  Psychiatric:        Mood and Affect: Mood and affect normal.        Behavior: Behavior normal.     Results for orders placed or performed in visit on 12/10/20  HM DIABETES EYE EXAM  Result Value Ref Range   HM Diabetic Eye Exam No Retinopathy No Retinopathy    Assessment & Plan:   Problem List Items Addressed This Visit      Cardiovascular and Mediastinum   Essential hypertension, benign - Primary   Relevant Medications   amLODipine (NORVASC) 5 MG tablet   hydrochlorothiazide (HYDRODIURIL) 25 MG tablet   olmesartan (BENICAR) 40 MG tablet   rosuvastatin (CRESTOR) 5 MG tablet   Other Relevant Orders   CMP14+EGFR   CBC with Differential/Platelet   Urinalysis, Complete     Endocrine   Hypothyroidism   Relevant Medications   levothyroxine (SYNTHROID) 200 MCG tablet   Other Relevant Orders   TSH     Other   Hyperlipidemia   Relevant Medications   amLODipine (NORVASC) 5 MG tablet   hydrochlorothiazide (HYDRODIURIL) 25 MG tablet   olmesartan (BENICAR) 40 MG tablet   rosuvastatin (CRESTOR) 5 MG tablet   Other Relevant Orders   Lipid panel    Other Visit Diagnoses    Essential hypertension       Relevant Medications   amLODipine (NORVASC) 5 MG tablet   iron polysaccharides (FERREX 150) 150 MG capsule   hydrochlorothiazide (HYDRODIURIL) 25 MG tablet   olmesartan (BENICAR) 40 MG tablet   rosuvastatin (CRESTOR) 5 MG tablet   Need for pneumococcal vaccination       Relevant Orders   Pneumococcal polysaccharide vaccine 23-valent greater than or equal to 2yo  subcutaneous/IM      Continue current medication, will check blood work today.  Blood pressure looks good today. Follow up plan: Return in about 6 months (around 06/17/2021), or if symptoms worsen or fail to improve, for Physical exam and hypertension.  Counseling provided for all of the vaccine components No orders of the defined types were placed in this encounter.   Caryl Pina, MD [facility] 12/15/2020, 1:46 PM

## 2020-12-16 LAB — CBC WITH DIFFERENTIAL/PLATELET
Basophils Absolute: 0 10*3/uL (ref 0.0–0.2)
Basos: 0 %
EOS (ABSOLUTE): 0.1 10*3/uL (ref 0.0–0.4)
Eos: 1 %
Hematocrit: 44.8 % (ref 37.5–51.0)
Hemoglobin: 14.9 g/dL (ref 13.0–17.7)
Immature Grans (Abs): 0 10*3/uL (ref 0.0–0.1)
Immature Granulocytes: 0 %
Lymphocytes Absolute: 2.4 10*3/uL (ref 0.7–3.1)
Lymphs: 23 %
MCH: 29 pg (ref 26.6–33.0)
MCHC: 33.3 g/dL (ref 31.5–35.7)
MCV: 87 fL (ref 79–97)
Monocytes Absolute: 0.8 10*3/uL (ref 0.1–0.9)
Monocytes: 8 %
Neutrophils Absolute: 7.1 10*3/uL — ABNORMAL HIGH (ref 1.4–7.0)
Neutrophils: 68 %
Platelets: 232 10*3/uL (ref 150–450)
RBC: 5.14 x10E6/uL (ref 4.14–5.80)
RDW: 13 % (ref 11.6–15.4)
WBC: 10.5 10*3/uL (ref 3.4–10.8)

## 2020-12-16 LAB — CMP14+EGFR
ALT: 19 IU/L (ref 0–44)
AST: 14 IU/L (ref 0–40)
Albumin/Globulin Ratio: 1.4 (ref 1.2–2.2)
Albumin: 4.6 g/dL (ref 3.8–4.8)
Alkaline Phosphatase: 87 IU/L (ref 44–121)
BUN/Creatinine Ratio: 17 (ref 10–24)
BUN: 19 mg/dL (ref 8–27)
Bilirubin Total: 0.6 mg/dL (ref 0.0–1.2)
CO2: 24 mmol/L (ref 20–29)
Calcium: 10.1 mg/dL (ref 8.6–10.2)
Chloride: 95 mmol/L — ABNORMAL LOW (ref 96–106)
Creatinine, Ser: 1.11 mg/dL (ref 0.76–1.27)
Globulin, Total: 3.2 g/dL (ref 1.5–4.5)
Glucose: 95 mg/dL (ref 65–99)
Potassium: 4.7 mmol/L (ref 3.5–5.2)
Sodium: 138 mmol/L (ref 134–144)
Total Protein: 7.8 g/dL (ref 6.0–8.5)
eGFR: 73 mL/min/{1.73_m2} (ref >59)

## 2020-12-16 LAB — LIPID PANEL
Chol/HDL Ratio: 3.2 ratio (ref 0.0–5.0)
Cholesterol, Total: 115 mg/dL (ref 100–199)
HDL: 36 mg/dL — ABNORMAL LOW (ref >39)
LDL Chol Calc (NIH): 59 mg/dL (ref 0–99)
Triglycerides: 104 mg/dL (ref 0–149)
VLDL Cholesterol Cal: 20 mg/dL (ref 5–40)

## 2020-12-16 LAB — TSH: TSH: 0.21 u[IU]/mL — ABNORMAL LOW (ref 0.450–4.500)

## 2020-12-17 ENCOUNTER — Other Ambulatory Visit: Payer: Self-pay

## 2020-12-17 DIAGNOSIS — D509 Iron deficiency anemia, unspecified: Secondary | ICD-10-CM | POA: Diagnosis not present

## 2020-12-17 NOTE — Addendum Note (Signed)
Addended by: Liliane Bade on: 12/17/2020 02:36 PM   Modules accepted: Orders

## 2020-12-18 LAB — FECAL OCCULT BLOOD, IMMUNOCHEMICAL: Fecal Occult Bld: NEGATIVE

## 2020-12-30 ENCOUNTER — Encounter: Payer: Self-pay | Admitting: *Deleted

## 2021-01-04 ENCOUNTER — Other Ambulatory Visit: Payer: Self-pay | Admitting: Family Medicine

## 2021-01-04 DIAGNOSIS — I10 Essential (primary) hypertension: Secondary | ICD-10-CM

## 2021-01-10 ENCOUNTER — Telehealth: Payer: Self-pay

## 2021-01-10 ENCOUNTER — Other Ambulatory Visit: Payer: Self-pay | Admitting: Family Medicine

## 2021-01-10 DIAGNOSIS — I10 Essential (primary) hypertension: Secondary | ICD-10-CM

## 2021-01-10 NOTE — Telephone Encounter (Signed)
[id number]

## 2021-02-22 DIAGNOSIS — M722 Plantar fascial fibromatosis: Secondary | ICD-10-CM | POA: Diagnosis not present

## 2021-02-22 DIAGNOSIS — M79672 Pain in left foot: Secondary | ICD-10-CM | POA: Diagnosis not present

## 2021-02-23 ENCOUNTER — Ambulatory Visit (INDEPENDENT_AMBULATORY_CARE_PROVIDER_SITE_OTHER): Payer: Medicare Other

## 2021-02-23 ENCOUNTER — Ambulatory Visit (INDEPENDENT_AMBULATORY_CARE_PROVIDER_SITE_OTHER): Payer: Medicare Other | Admitting: Family Medicine

## 2021-02-23 ENCOUNTER — Encounter: Payer: Self-pay | Admitting: Family Medicine

## 2021-02-23 VITALS — BP 113/63 | HR 70 | Temp 97.8°F

## 2021-02-23 DIAGNOSIS — R059 Cough, unspecified: Secondary | ICD-10-CM

## 2021-02-23 DIAGNOSIS — J22 Unspecified acute lower respiratory infection: Secondary | ICD-10-CM | POA: Diagnosis not present

## 2021-02-23 MED ORDER — AMOXICILLIN-POT CLAVULANATE 875-125 MG PO TABS
1.0000 | ORAL_TABLET | Freq: Two times a day (BID) | ORAL | 0 refills | Status: AC
Start: 2021-02-23 — End: 2021-03-02

## 2021-02-23 MED ORDER — GUAIFENESIN ER 600 MG PO TB12: 600.0000 mg | ORAL_TABLET | Freq: Two times a day (BID) | ORAL | 0 refills | Status: DC

## 2021-02-23 NOTE — Progress Notes (Signed)
Acute Office Visit  Subjective:    Patient ID: Troy Acosta, male    DOB: 1951-01-04, 70 y.o.   MRN: [id number]  Chief Complaint  Patient presents with  . Cough    HPI Patient is in today for congestion and sore throat for 6 days. Reports sore throat has improved. He started coughing a few days ago. Cough is nonproductive. Denies shortness of breath, wheezing, or chest pain. Denies fever, body aches or chills, nausea, vomiting, or diarrhea. Does report fatigue and increased sweating. He had pneumonia in 2021 and feels similar to him. There are 10 people out at work due to [employer] right now. He has been vaccinated and had a booster.    Past Medical History:  Diagnosis Date  . Anxiety   . Arthritis    "all my joints" (08/24/2017)  . Diaphragmatic hernia without mention of obstruction or gangrene   . Dysrhythmia    afib, ablation- 12/2015, no problems since, off blood thinner- 09/2016  . Essential hypertension, benign   . Headache    had MRI- was consulted with Dr. Jannifer Franklin,, headache has resolved"probably related to caffeine"  . Hyperplasia of prostate   . Iron deficiency anemia, unspecified   . Malaise and fatigue   . Obesity   . Other and unspecified hyperlipidemia   . Paroxysmal atrial fibrillation (HCC)   . PONV (postoperative nausea and vomiting)   . Unspecified hypothyroidism   . Wears dentures     Past Surgical History:  Procedure Laterality Date  . COLONOSCOPY WITH ESOPHAGOGASTRODUODENOSCOPY (EGD)    . ELECTROPHYSIOLOGIC STUDY N/A 12/24/2015   Afib ablation by Dr Rayann Heman  . JOINT REPLACEMENT    . KNEE ARTHROSCOPY Right ~ 2016  . LUNG BIOPSY  1990s   "no problems identified"  . MULTIPLE TOOTH EXTRACTIONS    . TOTAL KNEE ARTHROPLASTY Right 03/02/2017   Procedure: RIGHT TOTAL KNEE ARTHROPLASTY;  Surgeon: [name], MD;  Location: [facility];  Service: Orthopedics;  Laterality: Right;  . TOTAL KNEE ARTHROPLASTY Left 08/24/2017  . TOTAL KNEE ARTHROPLASTY Left 08/24/2017    Procedure: LEFT TOTAL KNEE ARTHROPLASTY;  Surgeon: [name], MD;  Location: [facility];  Service: Orthopedics;  Laterality: Left;    Family History  Problem Relation Age of Onset  . Heart failure Mother   . Hypertension Mother   . Diabetes Mother   . Lung cancer Father   . COPD Father   . Tuberous sclerosis Father   . Heart disease Sister   . Cancer Brother   . Hypertension Sister   . Thyroid disease Sister   . Crohn's disease Sister   . Lung cancer Paternal Grandfather     Social History   Socioeconomic History  . Marital status: Married    Spouse name: Not on file  . Number of children: 0  . Years of education: 24  . Highest education level: Not on file  Occupational History  . Occupation: Programmer, systems: [employer]  Tobacco Use  . Smoking status: Former Smoker    Years: 22.00    Types: Cigars    Quit date: 12/06/1992    Years since quitting: 28.2  . Smokeless tobacco: Never Used  Vaping Use  . Vaping Use: Never used  Substance and Sexual Activity  . Alcohol use: No  . Drug use: No  . Sexual activity: Not Currently    Partners: Female  Other Topics Concern  . Not on file  Social History Narrative  Lives at home w/ his wife   Left-handed   Caffeine: 1 cup coffee per day   Social Determinants of Health   Financial Resource Strain: Not on file  Food Insecurity: Not on file  Transportation Needs: Not on file  Physical Activity: Not on file  Stress: Not on file  Social Connections: Not on file  Intimate Partner Violence: Not on file    Outpatient Medications Prior to Visit  Medication Sig Dispense Refill  . acetaminophen (TYLENOL) 500 MG tablet Take 500 mg by mouth 3 (three) times daily.    Marland Kitchen allopurinol (ZYLOPRIM) 100 MG tablet Take 2 tablets (200 mg total) by mouth daily. 180 tablet 3  . amLODipine (NORVASC) 5 MG tablet Take 1 tablet (5 mg total) by mouth daily. 90 tablet 3  . aspirin EC 81 MG tablet Take 81 mg by mouth daily.    Marland Kitchen FERREX  150 150 MG capsule Take 1 capsule by mouth once daily 30 capsule 5  . hydrochlorothiazide (HYDRODIURIL) 25 MG tablet Take 1 tablet (25 mg total) by mouth daily. 90 tablet 3  . levothyroxine (SYNTHROID) 200 MCG tablet Take 1 tablet (200 mcg total) by mouth daily before breakfast. 90 tablet 3  . olmesartan (BENICAR) 40 MG tablet Take 0.5 tablets (20 mg total) by mouth 2 (two) times daily. 90 tablet 3  . rosuvastatin (CRESTOR) 5 MG tablet Take 1 tablet (5 mg total) by mouth daily. 90 tablet 3   Facility-Administered Medications Prior to Visit  Medication Dose Route Frequency Provider Last Rate Last Admin  . gadopentetate dimeglumine (MAGNEVIST) injection 20 mL  20 mL Intravenous Once PRN Kathrynn Ducking, MD        No Known Allergies  Review of Systems As per HPI.     Objective:    Physical Exam Vitals and nursing note reviewed.  Constitutional:      General: He is not in acute distress.    Appearance: Normal appearance. He is not ill-appearing, toxic-appearing or diaphoretic.  HENT:     Head: Normocephalic and atraumatic.     Nose: Congestion present.     Mouth/Throat:     Mouth: Mucous membranes are moist.     Pharynx: Oropharynx is clear.  Cardiovascular:     Rate and Rhythm: Normal rate and regular rhythm.     Heart sounds: Normal heart sounds. No murmur heard.   Pulmonary:     Effort: Pulmonary effort is normal. No respiratory distress.     Breath sounds: Normal breath sounds. No wheezing, rhonchi or rales.  Chest:     Chest wall: No tenderness.  Musculoskeletal:     Right lower leg: No edema.     Left lower leg: No edema.  Skin:    General: Skin is warm and dry.  Neurological:     General: No focal deficit present.     Mental Status: He is alert and oriented to person, place, and time.  Psychiatric:        Mood and Affect: Mood normal.        Behavior: Behavior normal.     BP 113/63   Pulse 70   Temp 97.8 F (36.6 C) (Temporal)   SpO2 96%  Wt Readings  from Last 3 Encounters:  12/15/20 256 lb (116.1 kg)  11/12/20 262 lb 9.6 oz (119.1 kg)  06/17/20 254 lb (115.2 kg)    There are no preventive care reminders to display for this patient.  There are no preventive care  reminders to display for this patient.   Lab Results  Component Value Date   TSH 0.210 (L) 12/15/2020   Lab Results  Component Value Date   WBC 10.5 12/15/2020   HGB 14.9 12/15/2020   HCT 44.8 12/15/2020   MCV 87 12/15/2020   PLT 232 12/15/2020   Lab Results  Component Value Date   NA 138 12/15/2020   K 4.7 12/15/2020   CO2 24 12/15/2020   GLUCOSE 95 12/15/2020   BUN 19 12/15/2020   CREATININE 1.11 12/15/2020   BILITOT 0.6 12/15/2020   ALKPHOS 87 12/15/2020   AST 14 12/15/2020   ALT 19 12/15/2020   PROT 7.8 12/15/2020   ALBUMIN 4.6 12/15/2020   CALCIUM 10.1 12/15/2020   ANIONGAP 8 08/25/2017   EGFR 73 12/15/2020   Lab Results  Component Value Date   CHOL 115 12/15/2020   Lab Results  Component Value Date   HDL 36 (L) 12/15/2020   Lab Results  Component Value Date   LDLCALC 59 12/15/2020   Lab Results  Component Value Date   TRIG 104 12/15/2020   Lab Results  Component Value Date   CHOLHDL 3.2 12/15/2020   Lab Results  Component Value Date   HGBA1C 5.6 11/28/2017       Assessment & Plan:   Dossie was seen today for cough.  Diagnoses and all orders for this visit:  Lower respiratory infection CXR today in office, appear hazy compared to previous CXR. Radiology report pending. Augmentin for possible pneumonia. Mucinex for congestion. Rest and stay well hydrated. Covid test pending, quarantine until results. Return to office for new or worsening symptoms, or if symptoms persist.  -     DG Chest 2 View; Future -     Novel Coronavirus, NAA ([facility]) -     amoxicillin-clavulanate (AUGMENTIN) 875-125 MG tablet; Take 1 tablet by mouth 2 (two) times daily for 7 days. -     guaiFENesin (MUCINEX) 600 MG 12 hr tablet; Take 1-2 tablets  (600-1,200 mg total) by mouth 2 (two) times daily.  The patient indicates understanding of these issues and agrees with the plan.  Gwenlyn Perking, FNP

## 2021-02-25 ENCOUNTER — Telehealth: Payer: Self-pay | Admitting: Family Medicine

## 2021-02-25 DIAGNOSIS — U071 COVID-19: Secondary | ICD-10-CM

## 2021-02-25 LAB — SARS-COV-2, NAA 2 DAY TAT

## 2021-02-25 LAB — NOVEL CORONAVIRUS, NAA: SARS-CoV-2, NAA: DETECTED — AB

## 2021-02-25 MED ORDER — PREDNISONE 20 MG PO TABS: ORAL_TABLET | ORAL | 0 refills | Status: DC

## 2021-02-25 NOTE — Telephone Encounter (Signed)
Prednisone sent in.

## 2021-02-25 NOTE — Telephone Encounter (Signed)
Pt aware.

## 2021-03-09 ENCOUNTER — Telehealth: Payer: Self-pay | Admitting: Family Medicine

## 2021-03-09 DIAGNOSIS — E038 Other specified hypothyroidism: Secondary | ICD-10-CM

## 2021-03-09 NOTE — Telephone Encounter (Signed)
Placed referral for the patient. 

## 2021-03-09 NOTE — Telephone Encounter (Signed)
Pts wife called stating that pt wants Dr Dettinger to send a new referral to Dr Barton Fanny (thyroid specialist) who he used to see but last referral was sent in 2019 so he needs a new one sent in.  Says Dr Dettinger told pt at his last visit that his thyroid level was elevated but didn't want to change anything. Pt says he feels like he does need to see the specialist.  Can fax referral with ov and labs to [phone].

## 2021-03-16 DIAGNOSIS — Z8679 Personal history of other diseases of the circulatory system: Secondary | ICD-10-CM | POA: Diagnosis not present

## 2021-03-16 DIAGNOSIS — Z8639 Personal history of other endocrine, nutritional and metabolic disease: Secondary | ICD-10-CM | POA: Diagnosis not present

## 2021-03-16 DIAGNOSIS — R7303 Prediabetes: Secondary | ICD-10-CM | POA: Diagnosis not present

## 2021-03-16 DIAGNOSIS — E039 Hypothyroidism, unspecified: Secondary | ICD-10-CM | POA: Diagnosis not present

## 2021-03-23 ENCOUNTER — Ambulatory Visit (INDEPENDENT_AMBULATORY_CARE_PROVIDER_SITE_OTHER): Payer: Medicare Other | Admitting: Family Medicine

## 2021-03-23 ENCOUNTER — Other Ambulatory Visit: Payer: Self-pay

## 2021-03-23 ENCOUNTER — Encounter: Payer: Self-pay | Admitting: Family Medicine

## 2021-03-23 VITALS — BP 133/77 | HR 76 | Ht 71.0 in | Wt 257.0 lb

## 2021-03-23 DIAGNOSIS — I10 Essential (primary) hypertension: Secondary | ICD-10-CM

## 2021-03-23 DIAGNOSIS — E038 Other specified hypothyroidism: Secondary | ICD-10-CM

## 2021-03-23 NOTE — Progress Notes (Signed)
BP 133/77   Pulse 76   Ht 5\' 11"  (1.803 m)   Wt 257 lb (116.6 kg)   SpO2 97%   BMI 35.84 kg/m    Subjective:   Patient ID: Troy Acosta, male    DOB: 12-17-1950, 70 y.o.   MRN: [id number]  HPI: Troy Acosta is a 70 y.o. male presenting on 03/23/2021 for Hypothyroidism   HPI Tremors Patient is coming in today for tremors shakes that he has been having over the past few weeks.  He says it is usually related to his thyroid he was thinking it was more related to that and went and saw endocrinology and they did adjust his thyroid dose.  He has not started the new dose yet.  But he just came in because he wants to see about the tremors.  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  He says he feels like it is there most of the time whenever he is trying to rest, not as bad when he is trying to do something.  He also has some fatigue along with it.  He does feel like it is his thyroid that is been off so he feels like that adjustment should help.  He denies any neurological or focal deficits aside from this.  Relevant past medical, surgical, family and social history reviewed and updated as indicated. Interim medical history since our last visit reviewed. Allergies and medications reviewed and updated.  Review of Systems  Constitutional:  Positive for fatigue. Negative for chills and fever.  Eyes:  Negative for visual disturbance.  Respiratory:  Negative for shortness of breath and wheezing.   Cardiovascular:  Negative for chest pain and leg swelling.  Musculoskeletal:  Negative for back pain and gait problem.  Skin:  Negative for rash.  Neurological:  Positive for tremors. Negative for dizziness, weakness and light-headedness.  All other systems reviewed and are negative.  Per HPI unless specifically indicated above   Allergies as of 03/23/2021   No Known Allergies      Medication List        Accurate as  of March 23, 2021  1:47 PM. If you have any questions, ask your nurse or doctor.          acetaminophen 500 MG tablet Commonly known as: TYLENOL Take 500 mg by mouth 3 (three) times daily.   allopurinol 100 MG tablet Commonly known as: ZYLOPRIM Take 2 tablets (200 mg total) by mouth daily.   amLODipine 5 MG tablet Commonly known as: NORVASC Take 1 tablet (5 mg total) by mouth daily.   aspirin EC 81 MG tablet Take 81 mg by mouth daily.   Ferrex 150 150 MG capsule Generic drug: iron polysaccharides Take 1 capsule by mouth once daily   guaiFENesin 600 MG 12 hr tablet Commonly known as: Mucinex Take 1-2 tablets (600-1,200 mg total) by mouth 2 (two) times daily.   hydrochlorothiazide 25 MG tablet Commonly known as: HYDRODIURIL Take 1 tablet (25 mg total) by mouth daily.   levothyroxine 200 MCG tablet Commonly known as: SYNTHROID Take 1 tablet (200 mcg total) by mouth daily before breakfast.   olmesartan 40 MG tablet Commonly known as: BENICAR Take 0.5 tablets (20 mg total) by mouth 2 (two) times daily.   predniSONE 20 MG tablet Commonly known as: DELTASONE 2 po at sametime daily for 5 days   rosuvastatin 5 MG tablet Commonly known  as: CRESTOR Take 1 tablet (5 mg total) by mouth daily.         Objective:   BP 133/77   Pulse 76   Ht 5\' 11"  (1.803 m)   Wt 257 lb (116.6 kg)   SpO2 97%   BMI 35.84 kg/m   Wt Readings from Last 3 Encounters:  03/23/21 257 lb (116.6 kg)  12/15/20 256 lb (116.1 kg)  11/12/20 262 lb 9.6 oz (119.1 kg)    Physical Exam Vitals and nursing note reviewed.  Constitutional:      General: He is not in acute distress.    Appearance: He is well-developed. He is not diaphoretic.  Eyes:     General: No scleral icterus.    Extraocular Movements: Extraocular movements intact.     Conjunctiva/sclera: Conjunctivae normal.     Pupils: Pupils are equal, round, and reactive to light.  Neck:     Thyroid: No thyromegaly.  Cardiovascular:      Rate and Rhythm: Normal rate and regular rhythm.     Heart sounds: Normal heart sounds. No murmur heard. Pulmonary:     Effort: Pulmonary effort is normal. No respiratory distress.     Breath sounds: Normal breath sounds. No wheezing.  Musculoskeletal:        General: Normal range of motion.     Cervical back: Neck supple.  Lymphadenopathy:     Cervical: No cervical adenopathy.  Skin:    General: Skin is warm and dry.     Findings: No rash.  Neurological:     General: No focal deficit present.     Mental Status: He is alert and oriented to person, place, and time. Mental status is at baseline.     Cranial Nerves: No cranial nerve deficit.     Motor: Tremor (Fine resting tremor in both hands) present. No weakness.     Coordination: Coordination normal.     Gait: Gait normal.     Deep Tendon Reflexes: Reflexes normal.  Psychiatric:        Behavior: Behavior normal.      Assessment & Plan:   Problem List Items Addressed This Visit       Cardiovascular and Mediastinum   Essential hypertension, benign     Endocrine   Hypothyroidism - Primary    We will see what the new thyroid dose does for him, if he is not feeling any better in 2 or 3 weeks and still having tremors, we may go see neurology and do a neurology referral.  He will call back and let us know Follow up plan: Return if symptoms worsen or fail to improve.  Counseling provided for all of the vaccine components No orders of the defined types were placed in this encounter.   Caryl Pina, MD [facility] 03/23/2021, 1:47 PM

## 2021-03-29 ENCOUNTER — Encounter: Payer: Self-pay | Admitting: *Deleted

## 2021-04-21 ENCOUNTER — Ambulatory Visit (INDEPENDENT_AMBULATORY_CARE_PROVIDER_SITE_OTHER): Payer: Medicare Other | Admitting: Family Medicine

## 2021-04-21 ENCOUNTER — Encounter: Payer: Self-pay | Admitting: Family Medicine

## 2021-04-21 VITALS — BP 130/84 | HR 83

## 2021-04-21 DIAGNOSIS — R059 Cough, unspecified: Secondary | ICD-10-CM

## 2021-04-21 DIAGNOSIS — R0981 Nasal congestion: Secondary | ICD-10-CM | POA: Diagnosis not present

## 2021-04-21 MED ORDER — AZITHROMYCIN 250 MG PO TABS: ORAL_TABLET | ORAL | 0 refills | Status: DC

## 2021-04-21 NOTE — Progress Notes (Signed)
BP 130/84   Pulse 83   SpO2 97%    Subjective:   Patient ID: Troy Acosta, male    DOB: 11/30/1950, 70 y.o.   MRN: [id number]  HPI: Troy Acosta is a 70 y.o. male presenting on 04/21/2021 for URI (Cough and congestion for two weeks//Fully Vaccinated- Moderna)   HPI Patient comes in complaining of cough and congestion and respiratory issues that is been going on over the past 2 weeks.  He says he is just not improving.  He denies any fevers or chills but he keeps getting a sinus drainage and cough and congestion.  He denies any shortness of breath or wheezing or sick contacts that he knows of.  He just feels like he cannot clear his sinuses.  He has been taking Mucinex and nasal saline.  Relevant past medical, surgical, family and social history reviewed and updated as indicated. Interim medical history since our last visit reviewed. Allergies and medications reviewed and updated.  Review of Systems  Constitutional:  Negative for chills and fever.  HENT:  Positive for congestion, postnasal drip, rhinorrhea, sinus pressure and sneezing. Negative for ear discharge, ear pain, sore throat and voice change.   Eyes:  Negative for pain, discharge, redness and visual disturbance.  Respiratory:  Positive for cough. Negative for shortness of breath and wheezing.   Cardiovascular:  Negative for chest pain and leg swelling.  Musculoskeletal:  Negative for gait problem.  Skin:  Negative for rash.  All other systems reviewed and are negative.  Per HPI unless specifically indicated above   Allergies as of 04/21/2021   No Known Allergies      Medication List        Accurate as of April 21, 2021  4:16 PM. If you have any questions, ask your nurse or doctor.          STOP taking these medications    guaiFENesin 600 MG 12 hr tablet Commonly known as: Mucinex Stopped by: [name] Jonell Brumbaugh, MD   predniSONE 20 MG tablet Commonly known as: DELTASONE Stopped by: [name] Robertlee Rogacki, MD        TAKE these medications    acetaminophen 500 MG tablet Commonly known as: TYLENOL Take 500 mg by mouth 3 (three) times daily.   allopurinol 100 MG tablet Commonly known as: ZYLOPRIM Take 2 tablets (200 mg total) by mouth daily.   amLODipine 5 MG tablet Commonly known as: NORVASC Take 1 tablet (5 mg total) by mouth daily.   aspirin EC 81 MG tablet Take 81 mg by mouth daily.   azithromycin 250 MG tablet Commonly known as: ZITHROMAX Take 2 the first day and then one each day after. Started by: Worthy Rancher, MD   Ferrex 150 150 MG capsule Generic drug: iron polysaccharides Take 1 capsule by mouth once daily   hydrochlorothiazide 25 MG tablet Commonly known as: HYDRODIURIL Take 1 tablet (25 mg total) by mouth daily.   levothyroxine 200 MCG tablet Commonly known as: SYNTHROID Take 1 tablet (200 mcg total) by mouth daily before breakfast.   olmesartan 40 MG tablet Commonly known as: BENICAR Take 0.5 tablets (20 mg total) by mouth 2 (two) times daily.   rosuvastatin 5 MG tablet Commonly known as: CRESTOR Take 1 tablet (5 mg total) by mouth daily.         Objective:   BP 130/84   Pulse 83   SpO2 97%   Wt Readings from Last 3 Encounters:  03/23/21 257 lb (  116.6 kg)  12/15/20 256 lb (116.1 kg)  11/12/20 262 lb 9.6 oz (119.1 kg)    Physical Exam Vitals and nursing note reviewed.  Constitutional:      General: He is not in acute distress.    Appearance: He is well-developed. He is not diaphoretic.  HENT:     Nose: Mucosal edema present.     Right Sinus: Maxillary sinus tenderness present. No frontal sinus tenderness.     Left Sinus: Maxillary sinus tenderness present. No frontal sinus tenderness.     Mouth/Throat:     Pharynx: Uvula midline.     Tonsils: No tonsillar abscesses.  Eyes:     General: No scleral icterus.    Conjunctiva/sclera: Conjunctivae normal.  Neck:     Thyroid: No thyromegaly.  Cardiovascular:     Rate and Rhythm: Normal rate  and regular rhythm.     Heart sounds: Normal heart sounds. No murmur heard. Pulmonary:     Effort: Pulmonary effort is normal. No respiratory distress.     Breath sounds: Normal breath sounds. No wheezing or rales.  Musculoskeletal:        General: Normal range of motion.     Cervical back: Neck supple.  Lymphadenopathy:     Cervical: No cervical adenopathy.  Skin:    General: Skin is warm and dry.     Findings: No rash.  Neurological:     Mental Status: He is alert and oriented to person, place, and time.     Coordination: Coordination normal.  Psychiatric:        Behavior: Behavior normal.      Assessment & Plan:   Problem List Items Addressed This Visit   None Visit Diagnoses     Congestion of nasal sinus    -  Primary   Relevant Medications   azithromycin (ZITHROMAX) 250 MG tablet   Other Relevant Orders   Novel Coronavirus, NAA ([facility])   Cough       Relevant Medications   azithromycin (ZITHROMAX) 250 MG tablet   Other Relevant Orders   Novel Coronavirus, NAA ([facility])       Will do COVID swab and will treat with azithromycin, likely sinusitis Follow up plan: Return if symptoms worsen or fail to improve.  Counseling provided for all of the vaccine components Orders Placed This Encounter  Procedures   Novel Coronavirus, NAA ([facility])    Caryl Pina, MD [facility] 04/21/2021, 4:16 PM

## 2021-04-22 LAB — SARS-COV-2, NAA 2 DAY TAT

## 2021-04-22 LAB — NOVEL CORONAVIRUS, NAA: SARS-CoV-2, NAA: NOT DETECTED

## 2021-06-15 ENCOUNTER — Ambulatory Visit (INDEPENDENT_AMBULATORY_CARE_PROVIDER_SITE_OTHER): Payer: Medicare Other

## 2021-06-15 DIAGNOSIS — Z0000 Encounter for general adult medical examination without abnormal findings: Secondary | ICD-10-CM

## 2021-06-15 NOTE — Progress Notes (Signed)
MEDICARE ANNUAL WELLNESS VISIT  06/15/2021  Telephone Visit Disclaimer This Medicare AWV was conducted by telephone due to national recommendations for restrictions regarding the COVID-19 Pandemic (e.g. social distancing).  I verified, using two identifiers, that I am speaking with Troy Acosta or their authorized healthcare agent. I discussed the limitations, risks, security, and privacy concerns of performing an evaluation and management service by telephone and the potential availability of an in-person appointment in the future. The patient expressed understanding and agreed to proceed.  Location of Patient: Home Location of Provider (nurse):  [facility]  Subjective:    Troy Acosta is a 70 y.o. male patient of Dettinger, [name], MD who had a Medicare Annual Wellness Visit today via telephone. [name] is Working full time and lives with their spouse. He has no children. He reports that he is socially active and does interact with friends/family regularly. He is moderately physically active and enjoys deer hunting.  Patient Care Team: Dettinger, [name], MD as PCP - General (Family Medicine) Troy Grayer, MD as PCP - Electrophysiology (Cardiology)  Advanced Directives 06/15/2021 10/05/2017 09/14/2017 08/24/2017 08/13/2017 03/28/2017 03/03/2017  Does Patient Have a Medical Advance Directive? No No No No No No No  Would patient like information on creating a medical advance directive? No - Patient declined - - Yes (Inpatient - patient defers creating a medical advance directive at this time) No - Patient declined - No - Patient declined    Hospital Utilization Over the Past 12 Months: # of hospitalizations or ER visits: 0 # of surgeries: 0  Review of Systems    Patient reports that his overall health is unchanged compared to last year.  History obtained from chart review and the patient  Patient Reported Readings (BP, Pulse, CBG, Weight, etc) none  Pain Assessment Pain : No/denies pain      Current Medications & Allergies (verified) Allergies as of 06/15/2021   No Known Allergies      Medication List        Accurate as of June 15, 2021  3:38 PM. If you have any questions, ask your nurse or doctor.          STOP taking these medications    azithromycin 250 MG tablet Commonly known as: ZITHROMAX       TAKE these medications    acetaminophen 500 MG tablet Commonly known as: TYLENOL Take 500 mg by mouth 3 (three) times daily.   allopurinol 100 MG tablet Commonly known as: ZYLOPRIM Take 2 tablets (200 mg total) by mouth daily.   amLODipine 5 MG tablet Commonly known as: NORVASC Take 1 tablet (5 mg total) by mouth daily.   aspirin EC 81 MG tablet Take 81 mg by mouth daily.   Ferrex 150 150 MG capsule Generic drug: iron polysaccharides Take 1 capsule by mouth once daily   hydrochlorothiazide 25 MG tablet Commonly known as: HYDRODIURIL Take 1 tablet (25 mg total) by mouth daily.   levothyroxine 200 MCG tablet Commonly known as: SYNTHROID Take 1 tablet (200 mcg total) by mouth daily before breakfast.   olmesartan 40 MG tablet Commonly known as: BENICAR Take 0.5 tablets (20 mg total) by mouth 2 (two) times daily.   rosuvastatin 5 MG tablet Commonly known as: CRESTOR Take 1 tablet (5 mg total) by mouth daily.   vitamin B-12 100 MCG tablet Commonly known as: CYANOCOBALAMIN Take 100 mcg by mouth daily.   Vitamin D (Cholecalciferol) 25 MCG (1000 UT) Caps Take by mouth.  History (reviewed): Past Medical History:  Diagnosis Date   Anxiety    Arthritis    "all my joints" (08/24/2017)   Diaphragmatic hernia without mention of obstruction or gangrene    Dysrhythmia    afib, ablation- 12/2015, no problems since, off blood thinner- 09/2016   Essential hypertension, benign    Headache    had MRI- was consulted with Dr. Jannifer Franklin,, headache has resolved"probably related to caffeine"   Hyperplasia of prostate    Iron deficiency  anemia, unspecified    Malaise and fatigue    Obesity    Other and unspecified hyperlipidemia    Paroxysmal atrial fibrillation (HCC)    PONV (postoperative nausea and vomiting)    Unspecified hypothyroidism    Wears dentures    Past Surgical History:  Procedure Laterality Date   COLONOSCOPY WITH ESOPHAGOGASTRODUODENOSCOPY (EGD)     ELECTROPHYSIOLOGIC STUDY N/A 12/24/2015   Afib ablation by Dr Rayann Heman   JOINT REPLACEMENT     KNEE ARTHROSCOPY Right ~ 2016   [facility]   "no problems identified"   MULTIPLE TOOTH EXTRACTIONS     TOTAL KNEE ARTHROPLASTY Right 03/02/2017   Procedure: RIGHT TOTAL KNEE ARTHROPLASTY;  Surgeon: [name], MD;  Location: [facility];  Service: Orthopedics;  Laterality: Right;   TOTAL KNEE ARTHROPLASTY Left 08/24/2017   TOTAL KNEE ARTHROPLASTY Left 08/24/2017   Procedure: LEFT TOTAL KNEE ARTHROPLASTY;  Surgeon: [name], MD;  Location: [facility];  Service: Orthopedics;  Laterality: Left;   Family History  Problem Relation Age of Onset   Heart failure Mother    Hypertension Mother    Diabetes Mother    Lung cancer Father    COPD Father    Tuberous sclerosis Father    Heart disease Sister    Hypertension Sister    Thyroid disease Sister    Crohn's disease Sister    Cancer Brother    Lung cancer Paternal Grandfather    Social History   Socioeconomic History   Marital status: Married    Spouse name: Not on file   Number of children: 0   Years of education: 12   Highest education level: Not on file  Occupational History   Occupation: Programmer, systems: [employer]  Tobacco Use   Smoking status: Former    Types: Cigars    Quit date: 12/06/1992    Years since quitting: 28.5   Smokeless tobacco: Never  Vaping Use   Vaping Use: Never used  Substance and Sexual Activity   Alcohol use: No   Drug use: No   Sexual activity: Not Currently    Partners: Female  Other Topics Concern   Not on file  Social History Narrative   Lives at  home w/ his wife   Left-handed   Caffeine: 1 cup coffee per day   Social Determinants of Health   Financial Resource Strain: Not on file  Food Insecurity: Not on file  Transportation Needs: Not on file  Physical Activity: Not on file  Stress: Not on file  Social Connections: Not on file    Activities of Daily Living In your present state of health, do you have any difficulty performing the following activities: 06/15/2021  Hearing? N  Vision? N  Difficulty concentrating or making decisions? N  Walking or climbing stairs? N  Dressing or bathing? N  Doing errands, shopping? N  Preparing Food and eating ? N  Using the Toilet? N  In the past six months,  have you accidently leaked urine? N  Do you have problems with loss of bowel control? N  Managing your Medications? N  Managing your Finances? N  Housekeeping or managing your Housekeeping? N  Some recent data might be hidden    Patient Education/ Literacy How often do you need to have someone help you when you read instructions, pamphlets, or other written materials from your doctor or pharmacy?: 1 - Never What is the last grade level you completed in school?: 12th grade  Exercise Current Exercise Habits: The patient does not participate in regular exercise at present, Exercise limited by: None identified  Diet Patient reports consuming 2 meals a day and 1 snack(s) a day Patient reports that his primary diet is: Low Sodium Patient reports that he does have regular access to food.   Depression Screen PHQ 2/9 Scores 06/15/2021 03/23/2021 12/15/2020 06/17/2020 05/17/2020 11/17/2019 04/17/2019  PHQ - 2 Score 0 0 0 0 0 0 0     Fall Risk Fall Risk  06/15/2021 03/23/2021 12/15/2020 06/17/2020 06/17/2020  Falls in the past year? 0 0 0 0 1  Number falls in past yr: - - - - 1  Injury with Fall? - - - 0 1  Follow up Falls evaluation completed - - - Falls evaluation completed     Objective:  Troy Acosta seemed alert and oriented and he participated  appropriately during our telephone visit.  Blood Pressure Weight BMI  BP Readings from Last 3 Encounters:  04/21/21 130/84  03/23/21 133/77  02/23/21 113/63   Wt Readings from Last 3 Encounters:  03/23/21 257 lb (116.6 kg)  12/15/20 256 lb (116.1 kg)  11/12/20 262 lb 9.6 oz (119.1 kg)   BMI Readings from Last 1 Encounters:  03/23/21 35.84 kg/m    *Unable to obtain current vital signs, weight, and BMI due to telephone visit type  Hearing/Vision  Keiyon did not seem to have difficulty with hearing/understanding during the telephone conversation Reports that he has had a formal eye exam by an eye care professional within the past year Reports that he has had a formal hearing evaluation within the past year *Unable to fully assess hearing and vision during telephone visit type  Cognitive Function: 6CIT Screen 06/15/2021  What Year? 0 points  What month? 0 points  What time? 0 points  Count back from 20 0 points  Months in reverse 0 points  Repeat phrase 0 points  Total Score 0   (Normal:0-7, Significant for Dysfunction: >8)  Normal Cognitive Function Screening: Yes   Immunization & Health Maintenance Record Immunization History  Administered Date(s) Administered   Influenza Inj Mdck Quad Pf 07/10/2017   Influenza Split 07/29/2013, 07/15/2019   Influenza Whole 07/09/2010   Influenza,inj,Quad PF,6+ Mos 07/17/2016   Influenza-Unspecified 07/30/2014, 07/22/2015, 07/02/2018, 07/19/2020   Moderna Sars-Covid-2 Vaccination 11/13/2019, 12/15/2019   Pneumococcal Polysaccharide-23 12/15/2020   Td 03/10/2003   Tdap 08/02/1999   Zoster, Live 05/20/2013    Health Maintenance  Topic Date Due   Zoster Vaccines- Shingrix (1 of 2) Never done   INFLUENZA VACCINE  05/09/2021   COVID-19 Vaccine (3 - Booster for Moderna series) 06/17/2021 (Originally 05/16/2020)   TETANUS/TDAP  07/09/2021   PNA vac Low Risk Adult (2 of 2 - PCV13) 12/15/2021   COLON CANCER SCREENING ANNUAL FOBT  12/17/2021    COLONOSCOPY (Pts 45-67yr Insurance coverage will need to be confirmed)  10/26/2024   Hepatitis C Screening  Completed   HPV VACCINES  Aged Out  Assessment  This is a routine wellness examination for Jeanphilippe Kyle.  Health Maintenance: Due or Overdue Health Maintenance Due  Topic Date Due   Zoster Vaccines- Shingrix (1 of 2) Never done   INFLUENZA VACCINE  05/09/2021    Troy Acosta does not need a referral for Community Assistance: Care Management:   no Social Work:    no Prescription Assistance:  no Nutrition/Diabetes Education:  no   Plan:  Personalized Goals  Goals Addressed             This Visit's Progress    Patient Stated       06/15/2021 AWV Goal: Exercise for General Health  Patient will verbalize understanding of the benefits of increased physical activity: Exercising regularly is important. It will improve your overall fitness, flexibility, and endurance. Regular exercise also will improve your overall health. It can help you control your weight, reduce stress, and improve your bone density. Over the next year, patient will increase physical activity as tolerated with a goal of at least 150 minutes of moderate physical activity per week.  You can tell that you are exercising at a moderate intensity if your heart starts beating faster and you start breathing faster but can still hold a conversation. Moderate-intensity exercise ideas include: Walking 1 mile (1.6 km) in about 15 minutes Biking Hiking Golfing Dancing Water aerobics Patient will verbalize understanding of everyday activities that increase physical activity by providing examples like the following: Yard work, such as: Sales promotion account executive Gardening Washing windows or floors Patient will be able to explain general safety guidelines for exercising:  Before you start a new exercise program, talk with your health  care provider. Do not exercise so much that you hurt yourself, feel dizzy, or get very short of breath. Wear comfortable clothes and wear shoes with good support. Drink plenty of water while you exercise to prevent dehydration or heat stroke. Work out until your breathing and your heartbeat get faster.        Personalized Health Maintenance & Screening Recommendations  Influenza vaccine Shingrix vaccine  Lung Cancer Screening Recommended: no (Low Dose CT Chest recommended if Age 66-80 years, 30 pack-year currently smoking OR have quit w/in past 15 years) Hepatitis C Screening recommended: no HIV Screening recommended: no  Advanced Directives: Written information was not prepared per patient's request.  Referrals & Orders No orders of the defined types were placed in this encounter.   Follow-up Plan Follow-up with Dettinger, [name], MD as planned    I have personally reviewed and noted the following in the patient's chart:   Medical and social history Use of alcohol, tobacco or illicit drugs  Current medications and supplements Functional ability and status Nutritional status Physical activity Advanced directives List of other physicians Hospitalizations, surgeries, and ER visits in previous 12 months Vitals Screenings to include cognitive, depression, and falls Referrals and appointments  In addition, I have reviewed and discussed with Troy Acosta certain preventive protocols, quality metrics, and best practice recommendations. A written personalized care plan for preventive services as well as general preventive health recommendations is available and can be mailed to the patient at his request.      Felicity Coyer, LPN    [id number]

## 2021-06-16 ENCOUNTER — Encounter: Payer: Self-pay | Admitting: Family Medicine

## 2021-06-16 ENCOUNTER — Other Ambulatory Visit: Payer: Self-pay

## 2021-06-16 ENCOUNTER — Ambulatory Visit (INDEPENDENT_AMBULATORY_CARE_PROVIDER_SITE_OTHER): Payer: Medicare Other | Admitting: Family Medicine

## 2021-06-16 VITALS — BP 129/77 | HR 87 | Ht 71.0 in | Wt 252.0 lb

## 2021-06-16 DIAGNOSIS — I10 Essential (primary) hypertension: Secondary | ICD-10-CM | POA: Diagnosis not present

## 2021-06-16 DIAGNOSIS — E038 Other specified hypothyroidism: Secondary | ICD-10-CM | POA: Diagnosis not present

## 2021-06-16 DIAGNOSIS — D509 Iron deficiency anemia, unspecified: Secondary | ICD-10-CM | POA: Diagnosis not present

## 2021-06-16 DIAGNOSIS — E7849 Other hyperlipidemia: Secondary | ICD-10-CM

## 2021-06-16 DIAGNOSIS — Z0001 Encounter for general adult medical examination with abnormal findings: Secondary | ICD-10-CM | POA: Diagnosis not present

## 2021-06-16 DIAGNOSIS — Z125 Encounter for screening for malignant neoplasm of prostate: Secondary | ICD-10-CM

## 2021-06-16 DIAGNOSIS — E039 Hypothyroidism, unspecified: Secondary | ICD-10-CM | POA: Diagnosis not present

## 2021-06-16 DIAGNOSIS — Z0000 Encounter for general adult medical examination without abnormal findings: Secondary | ICD-10-CM | POA: Diagnosis not present

## 2021-06-16 DIAGNOSIS — R7303 Prediabetes: Secondary | ICD-10-CM | POA: Diagnosis not present

## 2021-06-16 MED ORDER — POLYSACCHARIDE IRON COMPLEX 150 MG PO CAPS: 150.0000 mg | ORAL_CAPSULE | Freq: Every day | ORAL | 3 refills | Status: DC

## 2021-06-16 NOTE — Patient Instructions (Signed)
Troy Acosta, [facility] Colon[facility] 2016

## 2021-06-16 NOTE — Progress Notes (Signed)
BP 129/77   Pulse 87   Ht '5\' 11"'  (1.803 m)   Wt 252 lb (114.3 kg)   SpO2 96%   BMI 35.15 kg/m    Subjective:   Patient ID: Troy Acosta, male    DOB: 06/17/1951, 70 y.o.   MRN: [id number]  HPI: Troy Acosta is a 70 y.o. male presenting on 06/16/2021 for Medical Management of Chronic Issues (CPE) and Hypertension   HPI Well adult exam and physical and recheck chronic medical issues Patient is coming in today for well adult exam and physical and recheck chronic medical issues.  He denies any major health issues.  He does see a thyroid doctor for his thyroid medicine. Patient denies any chest pain, shortness of breath, headaches or vision issues, abdominal complaints, diarrhea, nausea, vomiting, or joint issues.   Hypertension Patient is currently on amlodipine and hydrochlorothiazide and olmesartan, and their blood pressure today is 129/77. Patient denies any lightheadedness or dizziness. Patient denies headaches, blurred vision, chest pains, shortness of breath, or weakness. Denies any side effects from medication and is content with current medication.   Hyperlipidemia Patient is coming in for recheck of his hyperlipidemia. The patient is currently taking Crestor. They deny any issues with myalgias or history of liver damage from it. They deny any focal numbness or weakness or chest pain.   Anemia recheck Patient is coming in for anemia recheck, continues to take iron.  We will recheck the levels.  He denies any lightheadedness or dizziness.  Relevant past medical, surgical, family and social history reviewed and updated as indicated. Interim medical history since our last visit reviewed. Allergies and medications reviewed and updated.  Review of Systems  Constitutional:  Negative for chills and fever.  HENT:  Negative for ear pain and tinnitus.   Eyes:  Negative for pain and discharge.  Respiratory:  Negative for cough, shortness of breath and wheezing.   Cardiovascular:  Negative for  chest pain, palpitations and leg swelling.  Gastrointestinal:  Negative for abdominal pain, blood in stool, constipation and diarrhea.  Genitourinary:  Negative for dysuria and hematuria.  Musculoskeletal:  Negative for back pain, gait problem and myalgias.  Skin:  Negative for rash.  Neurological:  Negative for dizziness, weakness and headaches.  Psychiatric/Behavioral:  Negative for suicidal ideas.   All other systems reviewed and are negative.  Per HPI unless specifically indicated above   Allergies as of 06/16/2021   No Known Allergies      Medication List        Accurate as of June 16, 2021  2:46 PM. If you have any questions, ask your nurse or doctor.          acetaminophen 500 MG tablet Commonly known as: TYLENOL Take 500 mg by mouth 3 (three) times daily.   allopurinol 100 MG tablet Commonly known as: ZYLOPRIM Take 2 tablets (200 mg total) by mouth daily.   amLODipine 5 MG tablet Commonly known as: NORVASC Take 1 tablet (5 mg total) by mouth daily.   aspirin EC 81 MG tablet Take 81 mg by mouth daily.   hydrochlorothiazide 25 MG tablet Commonly known as: HYDRODIURIL Take 1 tablet (25 mg total) by mouth daily.   iron polysaccharides 150 MG capsule Commonly known as: Ferrex 150 Take 1 capsule (150 mg total) by mouth daily. What changed: how much to take Changed by: Worthy Rancher, MD   levothyroxine 200 MCG tablet Commonly known as: SYNTHROID Take 1 tablet (200 mcg total) by  mouth daily before breakfast.   olmesartan 40 MG tablet Commonly known as: BENICAR Take 0.5 tablets (20 mg total) by mouth 2 (two) times daily.   rosuvastatin 5 MG tablet Commonly known as: CRESTOR Take 1 tablet (5 mg total) by mouth daily.   vitamin B-12 100 MCG tablet Commonly known as: CYANOCOBALAMIN Take 100 mcg by mouth daily.   Vitamin D (Cholecalciferol) 25 MCG (1000 UT) Caps Take by mouth.         Objective:   BP 129/77   Pulse 87   Ht '5\' 11"'   (1.803 m)   Wt 252 lb (114.3 kg)   SpO2 96%   BMI 35.15 kg/m   Wt Readings from Last 3 Encounters:  06/16/21 252 lb (114.3 kg)  03/23/21 257 lb (116.6 kg)  12/15/20 256 lb (116.1 kg)    Physical Exam Vitals and nursing note reviewed.  Constitutional:      General: He is not in acute distress.    Appearance: He is well-developed. He is not diaphoretic.  HENT:     Right Ear: External ear normal.     Left Ear: External ear normal.     Nose: Nose normal.     Mouth/Throat:     Pharynx: No oropharyngeal exudate.  Eyes:     General: No scleral icterus.       Right eye: No discharge.     Conjunctiva/sclera: Conjunctivae normal.     Pupils: Pupils are equal, round, and reactive to light.  Neck:     Thyroid: No thyromegaly.  Cardiovascular:     Rate and Rhythm: Normal rate and regular rhythm.     Heart sounds: Normal heart sounds. No murmur heard. Pulmonary:     Effort: Pulmonary effort is normal. No respiratory distress.     Breath sounds: Normal breath sounds. No wheezing.  Abdominal:     General: Bowel sounds are normal. There is no distension.     Palpations: Abdomen is soft.     Tenderness: There is no abdominal tenderness. There is no guarding or rebound.  Musculoskeletal:        General: Normal range of motion.     Cervical back: Neck supple.  Lymphadenopathy:     Cervical: No cervical adenopathy.  Skin:    General: Skin is warm and dry.     Findings: No rash.  Neurological:     Mental Status: He is alert and oriented to person, place, and time.     Coordination: Coordination normal.  Psychiatric:        Behavior: Behavior normal.      Assessment & Plan:   Problem List Items Addressed This Visit       Cardiovascular and Mediastinum   Essential hypertension, benign     Endocrine   Hypothyroidism     Other   Iron deficiency anemia, unspecified   Relevant Medications   iron polysaccharides (FERREX 150) 150 MG capsule   Hyperlipidemia   Relevant Orders    CMP14+EGFR   Lipid panel   Other Visit Diagnoses     Well adult exam    -  Primary   Relevant Orders   CBC with Differential/Platelet   CMP14+EGFR   Lipid panel   PSA, total and free   Essential hypertension       Relevant Medications   iron polysaccharides (FERREX 150) 150 MG capsule   Prostate cancer screening       Relevant Orders   PSA, total and free  Family history of colon cancer, grandfather and brother both died of colon cancer, wants to go do colonoscopies every 5 years instead of 82 because of family history  No change in medication, will check blood work.  Checks his thyroid with endocrinology. Follow up plan: Return in about 6 months (around 12/14/2021), or if symptoms worsen or fail to improve, for Hypertension and cholesterol recheck.  Counseling provided for all of the vaccine components Orders Placed This Encounter  Procedures   CBC with Differential/Platelet   CMP14+EGFR   Lipid panel   PSA, total and free    Caryl Pina, MD [facility] 06/16/2021, 2:46 PM

## 2021-06-17 ENCOUNTER — Encounter: Payer: Medicare Other | Admitting: Family Medicine

## 2021-06-17 LAB — CMP14+EGFR
ALT: 14 IU/L (ref 0–44)
AST: 14 IU/L (ref 0–40)
Albumin/Globulin Ratio: 1.3 (ref 1.2–2.2)
Albumin: 4.2 g/dL (ref 3.8–4.8)
Alkaline Phosphatase: 76 IU/L (ref 44–121)
BUN/Creatinine Ratio: 27 — ABNORMAL HIGH (ref 10–24)
BUN: 27 mg/dL (ref 8–27)
Bilirubin Total: 0.3 mg/dL (ref 0.0–1.2)
CO2: 26 mmol/L (ref 20–29)
Calcium: 9.2 mg/dL (ref 8.6–10.2)
Chloride: 98 mmol/L (ref 96–106)
Creatinine, Ser: 1.01 mg/dL (ref 0.76–1.27)
Globulin, Total: 3.2 g/dL (ref 1.5–4.5)
Glucose: 93 mg/dL (ref 65–99)
Potassium: 4.1 mmol/L (ref 3.5–5.2)
Sodium: 138 mmol/L (ref 134–144)
Total Protein: 7.4 g/dL (ref 6.0–8.5)
eGFR: 82 mL/min/{1.73_m2} (ref >59)

## 2021-06-17 LAB — CBC WITH DIFFERENTIAL/PLATELET
Basophils Absolute: 0 10*3/uL (ref 0.0–0.2)
Basos: 0 %
EOS (ABSOLUTE): 0.1 10*3/uL (ref 0.0–0.4)
Eos: 1 %
Hematocrit: 43.7 % (ref 37.5–51.0)
Hemoglobin: 14.8 g/dL (ref 13.0–17.7)
Immature Grans (Abs): 0 10*3/uL (ref 0.0–0.1)
Immature Granulocytes: 0 %
Lymphocytes Absolute: 2.6 10*3/uL (ref 0.7–3.1)
Lymphs: 27 %
MCH: 29.4 pg (ref 26.6–33.0)
MCHC: 33.9 g/dL (ref 31.5–35.7)
MCV: 87 fL (ref 79–97)
Monocytes Absolute: 0.8 10*3/uL (ref 0.1–0.9)
Monocytes: 8 %
Neutrophils Absolute: 6 10*3/uL (ref 1.4–7.0)
Neutrophils: 64 %
Platelets: 244 10*3/uL (ref 150–450)
RBC: 5.04 x10E6/uL (ref 4.14–5.80)
RDW: 12.9 % (ref 11.6–15.4)
WBC: 9.5 10*3/uL (ref 3.4–10.8)

## 2021-06-17 LAB — LIPID PANEL
Chol/HDL Ratio: 3.8 ratio (ref 0.0–5.0)
Cholesterol, Total: 119 mg/dL (ref 100–199)
HDL: 31 mg/dL — ABNORMAL LOW (ref >39)
LDL Chol Calc (NIH): 63 mg/dL (ref 0–99)
Triglycerides: 145 mg/dL (ref 0–149)
VLDL Cholesterol Cal: 25 mg/dL (ref 5–40)

## 2021-06-17 LAB — PSA, TOTAL AND FREE
PSA, Free Pct: 15.8 %
PSA, Free: 0.38 ng/mL
Prostate Specific Ag, Serum: 2.4 ng/mL (ref 0.0–4.0)

## 2021-06-23 DIAGNOSIS — R7303 Prediabetes: Secondary | ICD-10-CM | POA: Diagnosis not present

## 2021-06-23 DIAGNOSIS — E039 Hypothyroidism, unspecified: Secondary | ICD-10-CM | POA: Diagnosis not present

## 2021-06-23 DIAGNOSIS — Z8639 Personal history of other endocrine, nutritional and metabolic disease: Secondary | ICD-10-CM | POA: Diagnosis not present

## 2021-06-23 DIAGNOSIS — Z8679 Personal history of other diseases of the circulatory system: Secondary | ICD-10-CM | POA: Diagnosis not present

## 2021-07-28 DIAGNOSIS — M722 Plantar fascial fibromatosis: Secondary | ICD-10-CM | POA: Diagnosis not present

## 2021-07-28 DIAGNOSIS — M79672 Pain in left foot: Secondary | ICD-10-CM | POA: Diagnosis not present

## 2021-09-29 ENCOUNTER — Telehealth: Payer: Self-pay | Admitting: Family Medicine

## 2021-09-29 DIAGNOSIS — Z1211 Encounter for screening for malignant neoplasm of colon: Secondary | ICD-10-CM

## 2021-09-29 NOTE — Telephone Encounter (Signed)
REFERRAL REQUEST Telephone Note  Have you been seen at our office for this problem? YES (Advise that they may need an appointment with their PCP before a referral can be done)  Reason for Referral: Colonscopy Referral discussed with patient: yes  Best contact number of patient for referral team: [phone]    Has patient been seen by a specialist for this issue before: yes not sure who it was but would like to be referred to the same place he had last colonoscopy at     Patient notified that referrals can take up to a week or longer to process. If they haven't heard anything within a week they should call back and speak with the referral department.

## 2021-10-19 NOTE — Telephone Encounter (Signed)
Referral placed.

## 2021-10-19 NOTE — Telephone Encounter (Signed)
This referral has already been placed. Please see other telephone encounter. Will close encounter.

## 2021-10-27 DIAGNOSIS — D485 Neoplasm of uncertain behavior of skin: Secondary | ICD-10-CM | POA: Diagnosis not present

## 2021-10-27 DIAGNOSIS — L988 Other specified disorders of the skin and subcutaneous tissue: Secondary | ICD-10-CM | POA: Diagnosis not present

## 2021-10-27 DIAGNOSIS — D225 Melanocytic nevi of trunk: Secondary | ICD-10-CM | POA: Diagnosis not present

## 2021-10-27 DIAGNOSIS — B078 Other viral warts: Secondary | ICD-10-CM | POA: Diagnosis not present

## 2021-11-07 DIAGNOSIS — D485 Neoplasm of uncertain behavior of skin: Secondary | ICD-10-CM | POA: Diagnosis not present

## 2021-11-07 DIAGNOSIS — D2262 Melanocytic nevi of left upper limb, including shoulder: Secondary | ICD-10-CM | POA: Diagnosis not present

## 2021-11-08 NOTE — Telephone Encounter (Signed)
Attempts to contact pt without return call in over 3 days, will close encounter. 

## 2021-11-11 ENCOUNTER — Ambulatory Visit: Payer: Medicare Other | Admitting: Internal Medicine

## 2021-11-11 ENCOUNTER — Other Ambulatory Visit: Payer: Self-pay

## 2021-11-11 ENCOUNTER — Encounter: Payer: Self-pay | Admitting: Internal Medicine

## 2021-11-11 VITALS — BP 138/78 | HR 61 | Ht 71.0 in | Wt 258.0 lb

## 2021-11-11 DIAGNOSIS — I10 Essential (primary) hypertension: Secondary | ICD-10-CM | POA: Diagnosis not present

## 2021-11-11 DIAGNOSIS — E663 Overweight: Secondary | ICD-10-CM | POA: Diagnosis not present

## 2021-11-11 DIAGNOSIS — I480 Paroxysmal atrial fibrillation: Secondary | ICD-10-CM | POA: Diagnosis not present

## 2021-11-11 NOTE — Patient Instructions (Signed)
Medication Instructions:  Continue all current medications.  Labwork: none  Testing/Procedures: none  Follow-Up: 1 year - Dr.  Allred   Any Other Special Instructions Will Be Listed Below (If Applicable).   If you need a refill on your cardiac medications before your next appointment, please call your pharmacy.  

## 2021-11-11 NOTE — Progress Notes (Signed)
PCP: Dettinger, Troy Kaufmann, MD   Primary EP: Troy Acosta is a 71 y.o. male who presents today for routine electrophysiology followup.  Since last being seen in our clinic, the patient reports doing very well.  Today, he denies symptoms of palpitations, chest pain, shortness of breath,  lower extremity edema, dizziness, presyncope, or syncope.  The patient is otherwise without complaint today.   Past Medical History:  Diagnosis Date   Anxiety    Arthritis    "all my joints" (08/24/2017)   Diaphragmatic hernia without mention of obstruction or gangrene    Dysrhythmia    afib, ablation- 12/2015, no problems since, off blood thinner- 09/2016   Essential hypertension, benign    Headache    had MRI- was consulted with Troy. Jannifer Acosta,, headache has resolved"probably related to caffeine"   Hyperplasia of prostate    Iron deficiency anemia, unspecified    Malaise and fatigue    Obesity    Other and unspecified hyperlipidemia    Paroxysmal atrial fibrillation (HCC)    PONV (postoperative nausea and vomiting)    Unspecified hypothyroidism    Wears dentures    Past Surgical History:  Procedure Laterality Date   COLONOSCOPY WITH ESOPHAGOGASTRODUODENOSCOPY (EGD)     ELECTROPHYSIOLOGIC STUDY N/A 12/24/2015   Afib ablation by Troy Troy Acosta   JOINT REPLACEMENT     KNEE ARTHROSCOPY Right ~ 2016   Troy Acosta   "no problems identified"   MULTIPLE TOOTH EXTRACTIONS     TOTAL KNEE ARTHROPLASTY Right 03/02/2017   Procedure: RIGHT TOTAL KNEE ARTHROPLASTY;  Surgeon: Troy Cedars, MD;  Location: [facility];  Service: Orthopedics;  Laterality: Right;   TOTAL KNEE ARTHROPLASTY Left 08/24/2017   TOTAL KNEE ARTHROPLASTY Left 08/24/2017   Procedure: LEFT TOTAL KNEE ARTHROPLASTY;  Surgeon: Troy Cedars, MD;  Location: [facility];  Service: Orthopedics;  Laterality: Left;    ROS- all systems are reviewed and negatives except as per HPI above  Current Outpatient Medications  Medication Sig Dispense  Refill   acetaminophen (TYLENOL) 500 MG tablet Take 500 mg by mouth 3 (three) times daily.     allopurinol (ZYLOPRIM) 100 MG tablet Take 2 tablets (200 mg total) by mouth daily. 180 tablet 3   amLODipine (NORVASC) 5 MG tablet Take 1 tablet (5 mg total) by mouth daily. 90 tablet 3   aspirin EC 81 MG tablet Take 81 mg by mouth daily.     hydrochlorothiazide (HYDRODIURIL) 25 MG tablet Take 1 tablet (25 mg total) by mouth daily. 90 tablet 3   iron polysaccharides (FERREX 150) 150 MG capsule Take 1 capsule (150 mg total) by mouth daily. 90 capsule 3   levothyroxine (SYNTHROID) 200 MCG tablet Take 1 tablet (200 mcg total) by mouth daily before breakfast. 90 tablet 3   olmesartan (BENICAR) 40 MG tablet Take 0.5 tablets (20 mg total) by mouth 2 (two) times daily. 90 tablet 3   rosuvastatin (CRESTOR) 5 MG tablet Take 1 tablet (5 mg total) by mouth daily. 90 tablet 3   vitamin B-12 (CYANOCOBALAMIN) 100 MCG tablet Take 100 mcg by mouth daily.     Vitamin D, Cholecalciferol, 25 MCG (1000 UT) CAPS Take by mouth.     No current facility-administered medications for this visit.   Facility-Administered Medications Ordered in Other Visits  Medication Dose Route Frequency Provider Last Rate Last Admin   gadopentetate dimeglumine (MAGNEVIST) injection 20 mL  20 mL Intravenous Once PRN Kathrynn Ducking, MD  Physical Exam: Vitals:   11/11/21 0915  BP: 138/78  Pulse: 61  SpO2: 95%  Weight: 258 lb (117 kg)  Height: 5\' 11"  (1.803 m)    GEN- The patient is well appearing, alert and oriented x 3 today.   Head- normocephalic, atraumatic Eyes-  Sclera clear, conjunctiva pink Ears- hearing intact Oropharynx- clear Lungs- Clear to ausculation bilaterally, normal work of breathing Heart- Regular rate and rhythm, no murmurs, rubs or gallops, PMI not laterally displaced GI- soft, NT, ND, + BS Extremities- no clubbing, cyanosis, or edema  Wt Readings from Last 3 Encounters:  11/11/21 258 lb (117 kg)   06/16/21 252 lb (114.3 kg)  03/23/21 257 lb (116.6 kg)    EKG tracing ordered today is personally reviewed and shows sinus  Assessment and Plan:  Paroxysmal atrial fibrillation Well controlled post ablation off AAD Chads2vasc score is 2.  He has carefully decided to stop [place] therapy, recognizing risks of stroke vs risks of bleeding on [place]  2. HTN Stable No change required today  3. Overweight Body mass index is 35.98 kg/m. Lifestyle modification advised  4. Tachycardia mediated CM Resolved Echo 2/22 reviewed with patient again today  Return in a year  Troy Grayer MD, [facility] 11/11/2021 9:27 AM

## 2021-11-24 DIAGNOSIS — H35363 Drusen (degenerative) of macula, bilateral: Secondary | ICD-10-CM | POA: Diagnosis not present

## 2021-11-24 DIAGNOSIS — H40013 Open angle with borderline findings, low risk, bilateral: Secondary | ICD-10-CM | POA: Diagnosis not present

## 2021-11-24 DIAGNOSIS — H35033 Hypertensive retinopathy, bilateral: Secondary | ICD-10-CM | POA: Diagnosis not present

## 2021-11-24 DIAGNOSIS — H2513 Age-related nuclear cataract, bilateral: Secondary | ICD-10-CM | POA: Diagnosis not present

## 2021-12-13 ENCOUNTER — Other Ambulatory Visit: Payer: Self-pay | Admitting: Family Medicine

## 2021-12-13 DIAGNOSIS — D122 Benign neoplasm of ascending colon: Secondary | ICD-10-CM | POA: Diagnosis not present

## 2021-12-13 DIAGNOSIS — K648 Other hemorrhoids: Secondary | ICD-10-CM | POA: Diagnosis not present

## 2021-12-13 DIAGNOSIS — Z800 Family history of malignant neoplasm of digestive organs: Secondary | ICD-10-CM | POA: Diagnosis not present

## 2021-12-13 DIAGNOSIS — K5730 Diverticulosis of large intestine without perforation or abscess without bleeding: Secondary | ICD-10-CM | POA: Diagnosis not present

## 2021-12-13 DIAGNOSIS — D120 Benign neoplasm of cecum: Secondary | ICD-10-CM | POA: Diagnosis not present

## 2021-12-13 DIAGNOSIS — D124 Benign neoplasm of descending colon: Secondary | ICD-10-CM | POA: Diagnosis not present

## 2021-12-13 DIAGNOSIS — I10 Essential (primary) hypertension: Secondary | ICD-10-CM

## 2021-12-14 ENCOUNTER — Encounter: Payer: Self-pay | Admitting: Family Medicine

## 2021-12-14 NOTE — Telephone Encounter (Signed)
Left message for patient to call and make appt with Dettinger ?

## 2021-12-14 NOTE — Telephone Encounter (Signed)
Dettinger NTBS in March/April for 6 mos ckup. Mail order sent ?

## 2021-12-15 ENCOUNTER — Encounter: Payer: Self-pay | Admitting: Family Medicine

## 2021-12-15 ENCOUNTER — Ambulatory Visit (INDEPENDENT_AMBULATORY_CARE_PROVIDER_SITE_OTHER): Payer: Medicare Other | Admitting: Family Medicine

## 2021-12-15 VITALS — BP 129/70 | HR 66 | Ht 71.0 in | Wt 255.0 lb

## 2021-12-15 DIAGNOSIS — D509 Iron deficiency anemia, unspecified: Secondary | ICD-10-CM | POA: Diagnosis not present

## 2021-12-15 DIAGNOSIS — D122 Benign neoplasm of ascending colon: Secondary | ICD-10-CM | POA: Diagnosis not present

## 2021-12-15 DIAGNOSIS — I10 Essential (primary) hypertension: Secondary | ICD-10-CM | POA: Diagnosis not present

## 2021-12-15 DIAGNOSIS — D124 Benign neoplasm of descending colon: Secondary | ICD-10-CM | POA: Diagnosis not present

## 2021-12-15 DIAGNOSIS — Z23 Encounter for immunization: Secondary | ICD-10-CM | POA: Diagnosis not present

## 2021-12-15 DIAGNOSIS — E038 Other specified hypothyroidism: Secondary | ICD-10-CM

## 2021-12-15 DIAGNOSIS — E7849 Other hyperlipidemia: Secondary | ICD-10-CM | POA: Diagnosis not present

## 2021-12-15 DIAGNOSIS — D120 Benign neoplasm of cecum: Secondary | ICD-10-CM | POA: Diagnosis not present

## 2021-12-15 MED ORDER — LEVOTHYROXINE SODIUM 200 MCG PO TABS: 200.0000 ug | ORAL_TABLET | Freq: Every day | ORAL | 3 refills | Status: DC

## 2021-12-15 MED ORDER — AMLODIPINE BESYLATE 5 MG PO TABS: 5.0000 mg | ORAL_TABLET | Freq: Every day | ORAL | 3 refills | Status: DC

## 2021-12-15 MED ORDER — HYDROCHLOROTHIAZIDE 25 MG PO TABS: 25.0000 mg | ORAL_TABLET | Freq: Every day | ORAL | 3 refills | Status: DC

## 2021-12-15 MED ORDER — OLMESARTAN MEDOXOMIL 40 MG PO TABS: 20.0000 mg | ORAL_TABLET | Freq: Two times a day (BID) | ORAL | 3 refills | Status: DC

## 2021-12-15 MED ORDER — ROSUVASTATIN CALCIUM 5 MG PO TABS: 5.0000 mg | ORAL_TABLET | Freq: Every day | ORAL | 3 refills | Status: DC

## 2021-12-15 MED ORDER — ALLOPURINOL 100 MG PO TABS: 200.0000 mg | ORAL_TABLET | Freq: Every day | ORAL | 3 refills | Status: DC

## 2021-12-15 NOTE — Progress Notes (Signed)
? ?BP 129/70   Pulse 66   Ht '5\' 11"'  (1.803 m)   Wt 255 lb (115.7 kg)   SpO2 98%   BMI 35.57 kg/m?   ? ?Subjective:  ? ?Patient ID: Troy Acosta, male    DOB: 03/16/1951, 71 y.o.   MRN: [id number] ? ?HPI: ?Dre Gamino is a 71 y.o. male presenting on 12/15/2021 for Medical Management of Chronic Issues, Hypothyroidism, and Hypertension ? ? ?HPI ?Hypothyroidism recheck ?Patient is coming in for thyroid recheck today as well. They deny any issues with hair changes or heat or cold problems or diarrhea or constipation. They deny any chest pain or palpitations. They are currently on levothyroxine 200 micrograms  ? ?Hypertension ?Patient is currently on amlodipine and hydrochlorothiazide and olmesartan, and their blood pressure today is 129/70. Patient denies any lightheadedness or dizziness. Patient denies headaches, blurred vision, chest pains, shortness of breath, or weakness. Denies any side effects from medication and is content with current medication.  ? ?Hyperlipidemia ?Patient is coming in for recheck of his hyperlipidemia. The patient is currently taking Crestor. They deny any issues with myalgias or history of liver damage from it. They deny any focal numbness or weakness or chest pain.  ? ?Anemia recheck ?Patient is coming in for anemia recheck.  He denies any symptoms.  We will recheck his blood work ? ?Relevant past medical, surgical, family and social history reviewed and updated as indicated. Interim medical history since our last visit reviewed. ?Allergies and medications reviewed and updated. ? ?Review of Systems  ?Constitutional:  Negative for chills and fever.  ?Eyes:  Negative for visual disturbance.  ?Respiratory:  Negative for shortness of breath and wheezing.   ?Cardiovascular:  Negative for chest pain and leg swelling.  ?Musculoskeletal:  Negative for back pain and gait problem.  ?Skin:  Negative for rash.  ?Neurological:  Negative for dizziness, weakness and light-headedness.  ?All other systems  reviewed and are negative. ? ?Per HPI unless specifically indicated above ? ? ?Allergies as of 12/15/2021   ?No Known Allergies ?  ? ?  ?Medication List  ?  ? ?  ? Accurate as of December 15, 2021  1:51 PM. If you have any questions, ask your nurse or doctor.  ?  ?  ? ?  ? ?acetaminophen 500 MG tablet ?Commonly known as: TYLENOL ?Take 500 mg by mouth 3 (three) times daily. ?  ?allopurinol 100 MG tablet ?Commonly known as: ZYLOPRIM ?Take 2 tablets (200 mg total) by mouth daily. ?  ?amLODipine 5 MG tablet ?Commonly known as: NORVASC ?Take 1 tablet (5 mg total) by mouth daily. ?  ?aspirin EC 81 MG tablet ?Take 81 mg by mouth daily. ?  ?hydrochlorothiazide 25 MG tablet ?Commonly known as: HYDRODIURIL ?Take 1 tablet (25 mg total) by mouth daily. ?  ?iron polysaccharides 150 MG capsule ?Commonly known as: Ferrex 150 ?Take 1 capsule (150 mg total) by mouth daily. ?  ?levothyroxine 200 MCG tablet ?Commonly known as: SYNTHROID ?Take 1 tablet (200 mcg total) by mouth daily before breakfast. ?  ?olmesartan 40 MG tablet ?Commonly known as: BENICAR ?Take 0.5 tablets (20 mg total) by mouth 2 (two) times daily. ?  ?rosuvastatin 5 MG tablet ?Commonly known as: CRESTOR ?Take 1 tablet (5 mg total) by mouth daily. ?  ?vitamin B-12 100 MCG tablet ?Commonly known as: CYANOCOBALAMIN ?Take 100 mcg by mouth daily. ?  ?Vitamin D (Cholecalciferol) 25 MCG (1000 UT) Caps ?Take by mouth. ?  ? ?  ? ? ? ?  Objective:  ? ?BP 129/70   Pulse 66   Ht '5\' 11"'  (1.803 m)   Wt 255 lb (115.7 kg)   SpO2 98%   BMI 35.57 kg/m?   ?Wt Readings from Last 3 Encounters:  ?12/15/21 255 lb (115.7 kg)  ?11/11/21 258 lb (117 kg)  ?06/16/21 252 lb (114.3 kg)  ?  ?Physical Exam ?Vitals and nursing note reviewed.  ?Constitutional:   ?   General: He is not in acute distress. ?   Appearance: He is well-developed. He is not diaphoretic.  ?Eyes:  ?   General: No scleral icterus. ?   Conjunctiva/sclera: Conjunctivae normal.  ?Neck:  ?   Thyroid: No thyromegaly.  ?Cardiovascular:   ?   Rate and Rhythm: Normal rate and regular rhythm.  ?   Heart sounds: Normal heart sounds. No murmur heard. ?Pulmonary:  ?   Effort: Pulmonary effort is normal. No respiratory distress.  ?   Breath sounds: Normal breath sounds. No wheezing.  ?Musculoskeletal:     ?   General: No swelling. Normal range of motion.  ?   Cervical back: Neck supple.  ?Lymphadenopathy:  ?   Cervical: No cervical adenopathy.  ?Skin: ?   General: Skin is warm and dry.  ?   Findings: No rash.  ?Neurological:  ?   Mental Status: He is alert and oriented to person, place, and time.  ?   Coordination: Coordination normal.  ?Psychiatric:     ?   Behavior: Behavior normal.  ? ? ? ? ?Assessment & Plan:  ? ?Problem List Items Addressed This Visit   ? ?  ? Cardiovascular and Mediastinum  ? Essential hypertension - Primary  ? Relevant Medications  ? amLODipine (NORVASC) 5 MG tablet  ? hydrochlorothiazide (HYDRODIURIL) 25 MG tablet  ? olmesartan (BENICAR) 40 MG tablet  ? rosuvastatin (CRESTOR) 5 MG tablet  ? Other Relevant Orders  ? CMP14+EGFR  ? Lipid panel  ? TSH  ? CBC with Differential/Platelet  ?  ? Endocrine  ? Hypothyroidism  ? Relevant Medications  ? levothyroxine (SYNTHROID) 200 MCG tablet  ?  ? Other  ? Iron deficiency anemia, unspecified  ? Relevant Orders  ? CBC with Differential/Platelet  ? Hyperlipidemia  ? Relevant Medications  ? amLODipine (NORVASC) 5 MG tablet  ? hydrochlorothiazide (HYDRODIURIL) 25 MG tablet  ? olmesartan (BENICAR) 40 MG tablet  ? rosuvastatin (CRESTOR) 5 MG tablet  ? Other Relevant Orders  ? CMP14+EGFR  ? Lipid panel  ? TSH  ? CBC with Differential/Platelet  ? ?Other Visit Diagnoses   ? ? Need for pneumococcal vaccination      ? Relevant Orders  ? Pneumococcal conjugate vaccine 20-valent (Prevnar 20)  ? Need for Tdap vaccination      ? Relevant Orders  ? Tdap vaccine greater than or equal to 7yo IM  ? ?  ?  ?Continue current medicine, seems to be doing well.  We will check blood work. ?Follow up plan: ?Return in  about 6 months (around 06/17/2022), or if symptoms worsen or fail to improve, for Physical and thyroid recheck. ? ?Counseling provided for all of the vaccine components ?Orders Placed This Encounter  ?Procedures  ? Pneumococcal conjugate vaccine 20-valent (Prevnar 20)  ? Tdap vaccine greater than or equal to 7yo IM  ? CMP14+EGFR  ? Lipid panel  ? TSH  ? CBC with Differential/Platelet  ? ? ?Caryl Pina, MD ?[place] ?12/15/2021, 1:52 PM ? ? ? ? ?

## 2021-12-16 LAB — CBC WITH DIFFERENTIAL/PLATELET
Basophils Absolute: 0 10*3/uL (ref 0.0–0.2)
Basos: 0 %
EOS (ABSOLUTE): 0.1 10*3/uL (ref 0.0–0.4)
Eos: 1 %
Hematocrit: 45.7 % (ref 37.5–51.0)
Hemoglobin: 15.4 g/dL (ref 13.0–17.7)
Immature Grans (Abs): 0 10*3/uL (ref 0.0–0.1)
Immature Granulocytes: 0 %
Lymphocytes Absolute: 2.2 10*3/uL (ref 0.7–3.1)
Lymphs: 25 %
MCH: 28.9 pg (ref 26.6–33.0)
MCHC: 33.7 g/dL (ref 31.5–35.7)
MCV: 86 fL (ref 79–97)
Monocytes Absolute: 0.6 10*3/uL (ref 0.1–0.9)
Monocytes: 7 %
Neutrophils Absolute: 5.9 10*3/uL (ref 1.4–7.0)
Neutrophils: 67 %
Platelets: 220 10*3/uL (ref 150–450)
RBC: 5.32 x10E6/uL (ref 4.14–5.80)
RDW: 13 % (ref 11.6–15.4)
WBC: 8.9 10*3/uL (ref 3.4–10.8)

## 2021-12-16 LAB — CMP14+EGFR
ALT: 18 IU/L (ref 0–44)
AST: 18 IU/L (ref 0–40)
Albumin/Globulin Ratio: 1.6 (ref 1.2–2.2)
Albumin: 4.5 g/dL (ref 3.8–4.8)
Alkaline Phosphatase: 84 IU/L (ref 44–121)
BUN/Creatinine Ratio: 17 (ref 10–24)
BUN: 18 mg/dL (ref 8–27)
Bilirubin Total: 0.3 mg/dL (ref 0.0–1.2)
CO2: 24 mmol/L (ref 20–29)
Calcium: 9.4 mg/dL (ref 8.6–10.2)
Chloride: 100 mmol/L (ref 96–106)
Creatinine, Ser: 1.04 mg/dL (ref 0.76–1.27)
Globulin, Total: 2.9 g/dL (ref 1.5–4.5)
Glucose: 129 mg/dL — ABNORMAL HIGH (ref 70–99)
Potassium: 4.1 mmol/L (ref 3.5–5.2)
Sodium: 140 mmol/L (ref 134–144)
Total Protein: 7.4 g/dL (ref 6.0–8.5)
eGFR: 78 mL/min/{1.73_m2} (ref >59)

## 2021-12-16 LAB — LIPID PANEL
Chol/HDL Ratio: 3 ratio (ref 0.0–5.0)
Cholesterol, Total: 105 mg/dL (ref 100–199)
HDL: 35 mg/dL — ABNORMAL LOW (ref >39)
LDL Chol Calc (NIH): 43 mg/dL (ref 0–99)
Triglycerides: 157 mg/dL — ABNORMAL HIGH (ref 0–149)
VLDL Cholesterol Cal: 27 mg/dL (ref 5–40)

## 2021-12-16 LAB — TSH: TSH: 3.97 u[IU]/mL (ref 0.450–4.500)

## 2021-12-21 LAB — HM DIABETES EYE EXAM

## 2021-12-26 DIAGNOSIS — S61216A Laceration without foreign body of right little finger without damage to nail, initial encounter: Secondary | ICD-10-CM | POA: Diagnosis not present

## 2021-12-26 DIAGNOSIS — W268XXA Contact with other sharp object(s), not elsewhere classified, initial encounter: Secondary | ICD-10-CM | POA: Diagnosis not present

## 2022-03-02 DIAGNOSIS — L570 Actinic keratosis: Secondary | ICD-10-CM | POA: Diagnosis not present

## 2022-03-02 DIAGNOSIS — X32XXXD Exposure to sunlight, subsequent encounter: Secondary | ICD-10-CM | POA: Diagnosis not present

## 2022-03-02 DIAGNOSIS — D225 Melanocytic nevi of trunk: Secondary | ICD-10-CM | POA: Diagnosis not present

## 2022-03-02 DIAGNOSIS — Z1283 Encounter for screening for malignant neoplasm of skin: Secondary | ICD-10-CM | POA: Diagnosis not present

## 2022-03-13 ENCOUNTER — Ambulatory Visit (INDEPENDENT_AMBULATORY_CARE_PROVIDER_SITE_OTHER): Payer: Medicare Other | Admitting: Family Medicine

## 2022-03-13 ENCOUNTER — Encounter: Payer: Self-pay | Admitting: Family Medicine

## 2022-03-13 VITALS — BP 134/84 | HR 66 | Temp 98.7°F | Ht 71.0 in | Wt 257.0 lb

## 2022-03-13 DIAGNOSIS — R5383 Other fatigue: Secondary | ICD-10-CM

## 2022-03-13 NOTE — Progress Notes (Signed)
BP 134/84   Pulse 66   Temp 98.7 F (37.1 C)   Ht '5\' 11"'$  (1.803 m)   Wt 257 lb (116.6 kg)   SpO2 99%   BMI 35.84 kg/m    Subjective:   Patient ID: Troy Acosta, male    DOB: Apr 09, 1951, 71 y.o.   MRN: [id number]  HPI: Troy Acosta is a 71 y.o. male presenting on 03/13/2022 for Hypertension and Fatigue (Believes his thyroid may be off)   HPI Patient is coming in complaining of fatigue and decreased energy.  He says this has been going on over the past 4 days.  He cannot pinpoint anything else that has come up except that he feels fatigued and his blood pressures were slightly elevated and his energy has been down.  He says he feels slightly better today.  He denies any chest pain or shortness of breath or wheezing or urinary symptoms or diarrhea or constipation or blood in his stool. He says that he does have some increased belching and burping over the past few weeks.  Relevant past medical, surgical, family and social history reviewed and updated as indicated. Interim medical history since our last visit reviewed. Allergies and medications reviewed and updated.  Review of Systems  Constitutional:  Positive for fatigue. Negative for chills and fever.  Respiratory:  Negative for cough, shortness of breath and wheezing.   Cardiovascular:  Negative for chest pain and leg swelling.  Gastrointestinal:  Negative for abdominal pain, constipation, diarrhea, nausea and vomiting.  Genitourinary:  Negative for frequency, hematuria and urgency.  Musculoskeletal:  Negative for back pain and gait problem.  Skin:  Negative for rash.  Neurological:  Negative for dizziness, light-headedness and headaches.  Psychiatric/Behavioral:  Negative for dysphoric mood and sleep disturbance. The patient is not nervous/anxious.   All other systems reviewed and are negative.  Per HPI unless specifically indicated above   Allergies as of 03/13/2022   No Known Allergies      Medication List        Accurate  as of March 13, 2022  3:24 PM. If you have any questions, ask your nurse or doctor.          acetaminophen 500 MG tablet Commonly known as: TYLENOL Take 500 mg by mouth 3 (three) times daily.   allopurinol 100 MG tablet Commonly known as: ZYLOPRIM Take 2 tablets (200 mg total) by mouth daily.   amLODipine 5 MG tablet Commonly known as: NORVASC Take 1 tablet (5 mg total) by mouth daily.   aspirin EC 81 MG tablet Take 81 mg by mouth daily.   hydrochlorothiazide 25 MG tablet Commonly known as: HYDRODIURIL Take 1 tablet (25 mg total) by mouth daily.   iron polysaccharides 150 MG capsule Commonly known as: Ferrex 150 Take 1 capsule (150 mg total) by mouth daily.   levothyroxine 200 MCG tablet Commonly known as: SYNTHROID Take 1 tablet (200 mcg total) by mouth daily before breakfast.   olmesartan 40 MG tablet Commonly known as: BENICAR Take 0.5 tablets (20 mg total) by mouth 2 (two) times daily.   rosuvastatin 5 MG tablet Commonly known as: CRESTOR Take 1 tablet (5 mg total) by mouth daily.   vitamin B-12 100 MCG tablet Commonly known as: CYANOCOBALAMIN Take 100 mcg by mouth daily.   Vitamin D (Cholecalciferol) 25 MCG (1000 UT) Caps Take by mouth.         Objective:   BP 134/84   Pulse 66   Temp 98.7  F (37.1 C)   Ht '5\' 11"'$  (1.803 m)   Wt 257 lb (116.6 kg)   SpO2 99%   BMI 35.84 kg/m   Wt Readings from Last 3 Encounters:  03/13/22 257 lb (116.6 kg)  12/15/21 255 lb (115.7 kg)  11/11/21 258 lb (117 kg)    Physical Exam Vitals and nursing note reviewed.  Constitutional:      General: He is not in acute distress.    Appearance: He is well-developed. He is not diaphoretic.  Eyes:     General: No scleral icterus.    Conjunctiva/sclera: Conjunctivae normal.  Neck:     Thyroid: No thyromegaly.  Cardiovascular:     Rate and Rhythm: Normal rate and regular rhythm.     Heart sounds: Normal heart sounds. No murmur heard. Pulmonary:     Effort: Pulmonary  effort is normal. No respiratory distress.     Breath sounds: Normal breath sounds. No wheezing.  Abdominal:     General: Abdomen is flat. Bowel sounds are normal. There is no distension.     Tenderness: There is no abdominal tenderness. There is no guarding or rebound.  Musculoskeletal:        General: No swelling. Normal range of motion.     Cervical back: Neck supple.  Lymphadenopathy:     Cervical: No cervical adenopathy.  Skin:    General: Skin is warm and dry.     Findings: No rash.  Neurological:     Mental Status: He is alert and oriented to person, place, and time.     Coordination: Coordination normal.  Psychiatric:        Behavior: Behavior normal.    Results for orders placed or performed in visit on 12/21/21  HM DIABETES EYE EXAM  Result Value Ref Range   HM Diabetic Eye Exam No Retinopathy No Retinopathy    Assessment & Plan:   Problem List Items Addressed This Visit   None Visit Diagnoses     Other fatigue    -  Primary   Relevant Orders   CBC with Differential/Platelet   Thyroid Panel With TSH   Decreased energy       Relevant Orders   CBC with Differential/Platelet   Thyroid Panel With TSH       Check thyroid and blood counts look for cysts causes for fatigue, other thoughts are that he may have some type of infection that is just starting.  We will have him monitor for any symptoms of infection that may arise in the next week or so. Follow up plan: Return if symptoms worsen or fail to improve.  Counseling provided for all of the vaccine components Orders Placed This Encounter  Procedures   CBC with Differential/Platelet   Thyroid Panel With TSH    Caryl Pina, MD [facility] 03/13/2022, 3:24 PM

## 2022-03-14 LAB — CBC WITH DIFFERENTIAL/PLATELET
Basophils Absolute: 0 10*3/uL (ref 0.0–0.2)
Basos: 0 %
EOS (ABSOLUTE): 0.1 10*3/uL (ref 0.0–0.4)
Eos: 1 %
Hematocrit: 45.2 % (ref 37.5–51.0)
Hemoglobin: 15.2 g/dL (ref 13.0–17.7)
Immature Grans (Abs): 0 10*3/uL (ref 0.0–0.1)
Immature Granulocytes: 0 %
Lymphocytes Absolute: 2.7 10*3/uL (ref 0.7–3.1)
Lymphs: 26 %
MCH: 28.7 pg (ref 26.6–33.0)
MCHC: 33.6 g/dL (ref 31.5–35.7)
MCV: 85 fL (ref 79–97)
Monocytes Absolute: 0.8 10*3/uL (ref 0.1–0.9)
Monocytes: 8 %
Neutrophils Absolute: 6.8 10*3/uL (ref 1.4–7.0)
Neutrophils: 65 %
Platelets: 207 10*3/uL (ref 150–450)
RBC: 5.3 x10E6/uL (ref 4.14–5.80)
RDW: 13 % (ref 11.6–15.4)
WBC: 10.4 10*3/uL (ref 3.4–10.8)

## 2022-03-14 LAB — THYROID PANEL WITH TSH
Free Thyroxine Index: 2.9 (ref 1.2–4.9)
T3 Uptake Ratio: 31 % (ref 24–39)
T4, Total: 9.3 ug/dL (ref 4.5–12.0)
TSH: 5.21 u[IU]/mL — ABNORMAL HIGH (ref 0.450–4.500)

## 2022-03-15 ENCOUNTER — Telehealth: Payer: Self-pay | Admitting: Family Medicine

## 2022-03-15 NOTE — Telephone Encounter (Signed)
Dr. Warrick Parisian,  Please review labs. I will call them back. Thanks.  Left message for wife to return call.

## 2022-03-15 NOTE — Telephone Encounter (Signed)
Wife wants someone to call her with pts lab results asap.

## 2022-06-19 ENCOUNTER — Ambulatory Visit (INDEPENDENT_AMBULATORY_CARE_PROVIDER_SITE_OTHER): Payer: Medicare Other | Admitting: Family Medicine

## 2022-06-19 ENCOUNTER — Encounter: Payer: Self-pay | Admitting: Family Medicine

## 2022-06-19 VITALS — BP 118/63 | HR 68 | Ht 71.0 in | Wt 252.0 lb

## 2022-06-19 DIAGNOSIS — E7849 Other hyperlipidemia: Secondary | ICD-10-CM

## 2022-06-19 DIAGNOSIS — N400 Enlarged prostate without lower urinary tract symptoms: Secondary | ICD-10-CM

## 2022-06-19 DIAGNOSIS — I480 Paroxysmal atrial fibrillation: Secondary | ICD-10-CM | POA: Diagnosis not present

## 2022-06-19 DIAGNOSIS — I10 Essential (primary) hypertension: Secondary | ICD-10-CM | POA: Diagnosis not present

## 2022-06-19 DIAGNOSIS — E038 Other specified hypothyroidism: Secondary | ICD-10-CM | POA: Diagnosis not present

## 2022-06-19 DIAGNOSIS — Z23 Encounter for immunization: Secondary | ICD-10-CM

## 2022-06-19 NOTE — Addendum Note (Signed)
Addended by: Alphonzo Dublin on: 06/19/2022 02:56 PM   Modules accepted: Orders

## 2022-06-19 NOTE — Progress Notes (Signed)
BP 118/63   Pulse 68   Ht '5\' 11"'  (1.803 m)   Wt 252 lb (114.3 kg)   SpO2 97%   BMI 35.15 kg/m    Subjective:   Patient ID: Troy Acosta, male    DOB: [birth year], 71 y.o.   MRN: [id number]  HPI: Troy Acosta is a 71 y.o. male presenting on 06/19/2022 for Medical Management of Chronic Issues, Hypertension, and Hypothyroidism   HPI Hypertension and A-fib Patient is currently on amlodipine and hydrochlorothiazide and olmesartan, and their blood pressure today is 118/3/63. Patient denies any lightheadedness or dizziness. Patient denies headaches, blurred vision, chest pains, shortness of breath, or weakness. Denies any side effects from medication and is content with current medication.   Hypothyroidism recheck Patient is coming in for thyroid recheck today as well. They deny any issues with hair changes or heat or cold problems or diarrhea or constipation. They deny any chest pain or palpitations. They are currently on levothyroxine 200 micrograms   Hyperlipidemia Patient is coming in for recheck of his hyperlipidemia. The patient is currently taking Crestor. They deny any issues with myalgias or history of liver damage from it. They deny any focal numbness or weakness or chest pain.   BPH Patient is coming in for recheck on BPH Symptoms: None currently Medication: None currently Last PSA: Over a year ago normal  Relevant past medical, surgical, family and social history reviewed and updated as indicated. Interim medical history since our last visit reviewed. Allergies and medications reviewed and updated.  Review of Systems  Constitutional:  Negative for chills and fever.  Respiratory:  Negative for shortness of breath and wheezing.   Cardiovascular:  Negative for chest pain and leg swelling.  Musculoskeletal:  Negative for back pain and gait problem.  Skin:  Negative for rash.  All other systems reviewed and are negative.   Per HPI unless specifically indicated  above   Allergies as of 06/19/2022   No Known Allergies      Medication List        Accurate as of June 19, 2022  2:40 PM. If you have any questions, ask your nurse or doctor.          acetaminophen 500 MG tablet Commonly known as: TYLENOL Take 500 mg by mouth 3 (three) times daily.   allopurinol 100 MG tablet Commonly known as: ZYLOPRIM Take 2 tablets (200 mg total) by mouth daily.   amLODipine 5 MG tablet Commonly known as: NORVASC Take 1 tablet (5 mg total) by mouth daily.   aspirin EC 81 MG tablet Take 81 mg by mouth daily.   hydrochlorothiazide 25 MG tablet Commonly known as: HYDRODIURIL Take 1 tablet (25 mg total) by mouth daily.   iron polysaccharides 150 MG capsule Commonly known as: Ferrex 150 Take 1 capsule (150 mg total) by mouth daily.   levothyroxine 200 MCG tablet Commonly known as: SYNTHROID Take 1 tablet (200 mcg total) by mouth daily before breakfast.   olmesartan 40 MG tablet Commonly known as: BENICAR Take 0.5 tablets (20 mg total) by mouth 2 (two) times daily.   rosuvastatin 5 MG tablet Commonly known as: CRESTOR Take 1 tablet (5 mg total) by mouth daily.   vitamin B-12 100 MCG tablet Commonly known as: CYANOCOBALAMIN Take 100 mcg by mouth daily.   Vitamin D (Cholecalciferol) 25 MCG (1000 UT) Caps Take by mouth.         Objective:   BP 118/63   Pulse 68  Ht '5\' 11"'  (1.803 m)   Wt 252 lb (114.3 kg)   SpO2 97%   BMI 35.15 kg/m   Wt Readings from Last 3 Encounters:  06/19/22 252 lb (114.3 kg)  03/13/22 257 lb (116.6 kg)  12/15/21 255 lb (115.7 kg)    Physical Exam Vitals and nursing note reviewed.  Constitutional:      General: He is not in acute distress.    Appearance: He is well-developed. He is not diaphoretic.  Eyes:     General: No scleral icterus.    Conjunctiva/sclera: Conjunctivae normal.  Neck:     Thyroid: No thyromegaly.  Cardiovascular:     Rate and Rhythm: Normal rate and regular rhythm.      Heart sounds: Normal heart sounds. No murmur heard. Pulmonary:     Effort: Pulmonary effort is normal. No respiratory distress.     Breath sounds: Normal breath sounds. No wheezing.  Musculoskeletal:        General: No swelling. Normal range of motion.     Cervical back: Neck supple.  Lymphadenopathy:     Cervical: No cervical adenopathy.  Skin:    General: Skin is warm and dry.     Findings: No rash.  Neurological:     Mental Status: He is alert and oriented to person, place, and time.     Coordination: Coordination normal.  Psychiatric:        Behavior: Behavior normal.       Assessment & Plan:   Problem List Items Addressed This Visit       Cardiovascular and Mediastinum   Essential hypertension - Primary   Relevant Orders   CBC with Differential/Platelet   CMP14+EGFR   PAF (paroxysmal atrial fibrillation) (HCC)   Relevant Orders   CBC with Differential/Platelet   CMP14+EGFR     Endocrine   Hypothyroidism   Relevant Orders   TSH     Genitourinary   BPH (benign prostatic hyperplasia)   Relevant Orders   PSA, total and free     Other   Hyperlipidemia   Relevant Orders   Lipid panel    Continue current medicine, will check blood work today, seems to be doing well. Follow up plan: Return in about 6 months (around 12/18/2022), or if symptoms worsen or fail to improve, for Hypertension and thyroid recheck.  Counseling provided for all of the vaccine components Orders Placed This Encounter  Procedures   CBC with Differential/Platelet   CMP14+EGFR   Lipid panel   TSH   PSA, total and free    Caryl Pina, MD [facility] 06/19/2022, 2:40 PM

## 2022-06-20 LAB — CBC WITH DIFFERENTIAL/PLATELET
Basophils Absolute: 0 10*3/uL (ref 0.0–0.2)
Basos: 0 %
EOS (ABSOLUTE): 0.1 10*3/uL (ref 0.0–0.4)
Eos: 1 %
Hematocrit: 44.9 % (ref 37.5–51.0)
Hemoglobin: 15 g/dL (ref 13.0–17.7)
Immature Grans (Abs): 0 10*3/uL (ref 0.0–0.1)
Immature Granulocytes: 0 %
Lymphocytes Absolute: 2.5 10*3/uL (ref 0.7–3.1)
Lymphs: 25 %
MCH: 28.7 pg (ref 26.6–33.0)
MCHC: 33.4 g/dL (ref 31.5–35.7)
MCV: 86 fL (ref 79–97)
Monocytes Absolute: 0.8 10*3/uL (ref 0.1–0.9)
Monocytes: 8 %
Neutrophils Absolute: 6.4 10*3/uL (ref 1.4–7.0)
Neutrophils: 66 %
Platelets: 210 10*3/uL (ref 150–450)
RBC: 5.22 x10E6/uL (ref 4.14–5.80)
RDW: 13 % (ref 11.6–15.4)
WBC: 9.8 10*3/uL (ref 3.4–10.8)

## 2022-06-20 LAB — CMP14+EGFR
ALT: 18 IU/L (ref 0–44)
AST: 17 IU/L (ref 0–40)
Albumin/Globulin Ratio: 1.6 (ref 1.2–2.2)
Albumin: 4.6 g/dL (ref 3.9–4.9)
Alkaline Phosphatase: 83 IU/L (ref 44–121)
BUN/Creatinine Ratio: 22 (ref 10–24)
BUN: 25 mg/dL (ref 8–27)
Bilirubin Total: 0.3 mg/dL (ref 0.0–1.2)
CO2: 24 mmol/L (ref 20–29)
Calcium: 9.1 mg/dL (ref 8.6–10.2)
Chloride: 97 mmol/L (ref 96–106)
Creatinine, Ser: 1.13 mg/dL (ref 0.76–1.27)
Globulin, Total: 2.8 g/dL (ref 1.5–4.5)
Glucose: 96 mg/dL (ref 70–99)
Potassium: 4.7 mmol/L (ref 3.5–5.2)
Sodium: 139 mmol/L (ref 134–144)
Total Protein: 7.4 g/dL (ref 6.0–8.5)
eGFR: 71 mL/min/{1.73_m2} (ref >59)

## 2022-06-20 LAB — LIPID PANEL
Chol/HDL Ratio: 3 ratio (ref 0.0–5.0)
Cholesterol, Total: 110 mg/dL (ref 100–199)
HDL: 37 mg/dL — ABNORMAL LOW (ref >39)
LDL Chol Calc (NIH): 52 mg/dL (ref 0–99)
Triglycerides: 114 mg/dL (ref 0–149)
VLDL Cholesterol Cal: 21 mg/dL (ref 5–40)

## 2022-06-20 LAB — PSA, TOTAL AND FREE
PSA, Free Pct: 14.1 %
PSA, Free: 0.55 ng/mL
Prostate Specific Ag, Serum: 3.9 ng/mL (ref 0.0–4.0)

## 2022-06-20 LAB — TSH: TSH: 4.03 u[IU]/mL (ref 0.450–4.500)

## 2022-06-22 ENCOUNTER — Telehealth: Payer: Self-pay | Admitting: Family Medicine

## 2022-06-22 NOTE — Telephone Encounter (Signed)
Pt has been informed of results. He will come by tomorrow am and pick up. Endocrinologist is not in epic.

## 2022-06-25 ENCOUNTER — Other Ambulatory Visit: Payer: Self-pay | Admitting: Family Medicine

## 2022-06-25 DIAGNOSIS — I10 Essential (primary) hypertension: Secondary | ICD-10-CM

## 2022-06-26 DIAGNOSIS — E1169 Type 2 diabetes mellitus with other specified complication: Secondary | ICD-10-CM | POA: Diagnosis not present

## 2022-06-26 DIAGNOSIS — Z8639 Personal history of other endocrine, nutritional and metabolic disease: Secondary | ICD-10-CM | POA: Diagnosis not present

## 2022-06-26 DIAGNOSIS — E039 Hypothyroidism, unspecified: Secondary | ICD-10-CM | POA: Diagnosis not present

## 2022-07-30 ENCOUNTER — Other Ambulatory Visit: Payer: Self-pay

## 2022-07-30 ENCOUNTER — Emergency Department (HOSPITAL_BASED_OUTPATIENT_CLINIC_OR_DEPARTMENT_OTHER)
Admission: EM | Admit: 2022-07-30 | Discharge: 2022-07-30 | Disposition: A | Discharge status: Home / Self Care | Payer: Medicare Other | Attending: Emergency Medicine | Admitting: Emergency Medicine

## 2022-07-30 ENCOUNTER — Emergency Department (HOSPITAL_BASED_OUTPATIENT_CLINIC_OR_DEPARTMENT_OTHER): Payer: Medicare Other | Admitting: Radiology

## 2022-07-30 ENCOUNTER — Encounter (HOSPITAL_BASED_OUTPATIENT_CLINIC_OR_DEPARTMENT_OTHER): Payer: Self-pay | Admitting: Emergency Medicine

## 2022-07-30 DIAGNOSIS — Z79899 Other long term (current) drug therapy: Secondary | ICD-10-CM | POA: Diagnosis not present

## 2022-07-30 DIAGNOSIS — R42 Dizziness and giddiness: Secondary | ICD-10-CM | POA: Diagnosis not present

## 2022-07-30 DIAGNOSIS — Z7982 Long term (current) use of aspirin: Secondary | ICD-10-CM | POA: Insufficient documentation

## 2022-07-30 DIAGNOSIS — R079 Chest pain, unspecified: Secondary | ICD-10-CM | POA: Diagnosis not present

## 2022-07-30 DIAGNOSIS — I10 Essential (primary) hypertension: Secondary | ICD-10-CM

## 2022-07-30 LAB — BASIC METABOLIC PANEL
Anion gap: 12 (ref 5–15)
BUN: 18 mg/dL (ref 8–23)
CO2: 28 mmol/L (ref 22–32)
Calcium: 10 mg/dL (ref 8.9–10.3)
Chloride: 97 mmol/L — ABNORMAL LOW (ref 98–111)
Creatinine, Ser: 1.21 mg/dL (ref 0.61–1.24)
GFR, Estimated: >60 mL/min (ref >60)
Glucose, Bld: 172 mg/dL — ABNORMAL HIGH (ref 70–99)
Potassium: 4 mmol/L (ref 3.5–5.1)
Sodium: 137 mmol/L (ref 135–145)

## 2022-07-30 LAB — CBC
HCT: 46.8 % (ref 39.0–52.0)
Hemoglobin: 15.5 g/dL (ref 13.0–17.0)
MCH: 28.5 pg (ref 26.0–34.0)
MCHC: 33.1 g/dL (ref 30.0–36.0)
MCV: 86.2 fL (ref 80.0–100.0)
Platelets: 187 10*3/uL (ref 150–400)
RBC: 5.43 MIL/uL (ref 4.22–5.81)
RDW: 13.4 % (ref 11.5–15.5)
WBC: 11.1 10*3/uL — ABNORMAL HIGH (ref 4.0–10.5)
nRBC: 0 % (ref 0.0–0.2)

## 2022-07-30 LAB — TROPONIN I (HIGH SENSITIVITY): Troponin I (High Sensitivity): 3 ng/L (ref <18)

## 2022-07-30 NOTE — Discharge Instructions (Signed)
You were seen in the emergency department for your lightheadedness and your high blood pressure.  Your work-up here showed no signs of heart attack, severe anemia or dehydration.  Your blood pressure improved on its own while you were here.  Is unclear what is causing your symptoms today, however you should follow-up with your primary doctor to have your symptoms rechecked.  You can also check your blood pressures daily and keep a log to bring to your primary doctor's office.  I recommend checking it at the same time each day when you have been resting for at least 15 minutes.  You should return to the emergency department if your chest pain gets significantly worse, you pass out, or if you have any other new or concerning symptoms.

## 2022-07-30 NOTE — ED Triage Notes (Addendum)
Pt via pov from home with increased blood pressure today. He takes htn medication and awakened feeling dizzy and tingly. He took his bp and it was high, so he took another bp medication. Pt reports that he feels a bit better, but he has some right sided chest pain that continues. Pt was fine at bedtime at 2145 last night. Symptoms upon awakening. No neuro deficits noted.   Pt alert & oriented, nad noted.

## 2022-07-30 NOTE — ED Provider Notes (Signed)
[name] Provider Note   CSN: [id number] Arrival date & time: 07/30/22  1404     History  Chief Complaint  Patient presents with   Hypertension    Troy Acosta is a 71 y.o. male.  Patient is a 71 year old male with a past medical history of hypertension and hyperlipidemia presenting to the emergency department with lightheadedness.  The patient states that he woke up this morning and felt mildly lightheaded.  He states he checked his blood pressure and noticed that it was elevated in the 782N systolic.  He states he also had a mild 1 out of 10 right-sided chest pain.  He denies any shortness of breath, headaches, numbness or weakness.  He denies any lower extremity swelling.  He states he rechecked his blood sugar after church and it was elevated to 562Z systolic which prompted him to come to the emergency department.  He states that he did take 1 extra tab of his olmesartan.  He denies any recent changes to any of his blood pressure medicines.  The history is provided by the patient.  Hypertension       Home Medications Prior to Admission medications   Medication Sig Start Date End Date Taking? Authorizing Provider  acetaminophen (TYLENOL) 500 MG tablet Take 500 mg by mouth 3 (three) times daily.    [provider]  allopurinol (ZYLOPRIM) 100 MG tablet Take 2 tablets (200 mg total) by mouth daily. 12/15/21   Dettinger, [name], MD  amLODipine (NORVASC) 5 MG tablet Take 1 tablet (5 mg total) by mouth daily. 12/15/21   Dettinger, [name], MD  aspirin EC 81 MG tablet Take 81 mg by mouth daily.    [provider]  FERREX 150 150 MG capsule Take 1 capsule by mouth once daily 06/26/22   Dettinger, [name], MD  hydrochlorothiazide (HYDRODIURIL) 25 MG tablet Take 1 tablet (25 mg total) by mouth daily. 12/15/21   Dettinger, [name], MD  levothyroxine (SYNTHROID) 200 MCG tablet Take 1 tablet (200 mcg total) by mouth daily before breakfast. 12/15/21    Dettinger, [name], MD  olmesartan (BENICAR) 40 MG tablet Take 0.5 tablets (20 mg total) by mouth 2 (two) times daily. 12/15/21   Dettinger, [name], MD  rosuvastatin (CRESTOR) 5 MG tablet Take 1 tablet (5 mg total) by mouth daily. 12/15/21   Dettinger, [name], MD  vitamin B-12 (CYANOCOBALAMIN) 100 MCG tablet Take 100 mcg by mouth daily.    [provider]  Vitamin D, Cholecalciferol, 25 MCG (1000 UT) CAPS Take by mouth.    [provider]      Allergies    Patient has no known allergies.    Review of Systems   Review of Systems  Physical Exam Updated Vital Signs BP 136/79 (BP Location: Left Arm)   Pulse 61   Temp 98.3 F (36.8 C) (Oral)   Resp 16   Ht '5\' 11"'$  (1.803 m)   Wt 115.7 kg   SpO2 98%   BMI 35.57 kg/m  Physical Exam Vitals and nursing note reviewed.  Constitutional:      General: He is not in acute distress.    Appearance: Normal appearance.  HENT:     Head: Normocephalic and atraumatic.     Nose: Nose normal.     Mouth/Throat:     Mouth: Mucous membranes are moist.     Pharynx: Oropharynx is clear.  Eyes:     Extraocular Movements: Extraocular movements intact.  Conjunctiva/sclera: Conjunctivae normal.  Cardiovascular:     Rate and Rhythm: Normal rate and regular rhythm.     Pulses: Normal pulses.     Heart sounds: Normal heart sounds.  Pulmonary:     Effort: Pulmonary effort is normal.     Breath sounds: Normal breath sounds.  Abdominal:     General: Abdomen is flat.     Palpations: Abdomen is soft.     Tenderness: There is no abdominal tenderness.  Musculoskeletal:        General: Normal range of motion.     Cervical back: Normal range of motion and neck supple.     Right lower leg: No edema.     Left lower leg: No edema.  Skin:    General: Skin is warm and dry.  Neurological:     General: No focal deficit present.     Mental Status: He is alert and oriented to person, place, and time.  Psychiatric:        Mood and  Affect: Mood normal.        Behavior: Behavior normal.     ED Results / Procedures / Treatments   Labs (all labs ordered are listed, but only abnormal results are displayed) Labs Reviewed  BASIC METABOLIC PANEL - Abnormal; Notable for the following components:      Result Value   Chloride 97 (*)    Glucose, Bld 172 (*)    All other components within normal limits  CBC - Abnormal; Notable for the following components:   WBC 11.1 (*)    All other components within normal limits  TROPONIN I (HIGH SENSITIVITY)    EKG EKG Interpretation  Date/Time:  Sunday July 30 2022 14:36:11 EDT Ventricular Rate:  74 PR Interval:  148 QRS Duration: 96 QT Interval:  374 QTC Calculation: 415 R Axis:   -28 Text Interpretation: Normal sinus rhythm Minimal voltage criteria for LVH, may be normal variant ( R in aVL ) Borderline ECG When compared with ECG of 24-Jan-2016 14:21, T wave amplitude has decreased in Anterior leads No significant change since last tracing Confirmed by Leanord Asal ([id number]) on 07/30/2022 6:15:52 PM  Radiology DG Chest 2 View  Result Date: 07/30/2022 CLINICAL DATA:  cp EXAM: CHEST - 2 VIEW COMPARISON:  Chest x-ray 02/23/2021, chest x-ray 11/28/2017 FINDINGS: The heart and mediastinal contours are within normal limits. No focal consolidation. No pulmonary edema. No pleural effusion. No pneumothorax. No acute osseous abnormality. IMPRESSION: No active cardiopulmonary disease. Electronically Signed   By: Iven Finn M.D.   On: 07/30/2022 15:10    Procedures Procedures    Medications Ordered in ED Medications - No data to display  ED Course/ Medical Decision Making/ A&P                           Medical Decision Making This patient presents to the ED with chief complaint(s) of lightheadedness and hypertension with pertinent past medical history of hypertension, hyperlipidemia which further complicates the presenting complaint. The complaint involves an extensive  differential diagnosis and also carries with it a high risk of complications and morbidity.    The differential diagnosis includes arrhythmia, electrolyte abnormality, anemia, patient has no focal neurologic deficits making CVA unlikely, ACS, patient has equal pulses in all 4 extremities and mild pain making a dissection unlikely  Additional history obtained: Additional history obtained from family Records reviewed Primary Care Documents  ED Course and Reassessment: She was  initially evaluated by triage and had EKG, labs including troponin and chest x-ray performed.  EKG is nonischemic and troponin is negative.  Labs have no significant abnormality and chest x-ray without acute disease.  Patient has no signs of hypertensive emergency on his exam and blood pressure spontaneously improved to the 544B systolic.  Patient is stable for discharge home with primary care follow-up.  He was given strict return precautions.  Independent labs interpretation:  The following labs were independently interpreted: Within normal range  Independent visualization of imaging: - I independently visualized the following imaging with scope of interpretation limited to determining acute life threatening conditions related to emergency care: Chest x-ray, which revealed acute disease  Consultation: - Consulted or discussed management/test interpretation w/ external professional: N/A  Consideration for admission or further workup: Patient has no emergent conditions requiring admission at this time and is stable for discharge with primary care follow-up Social Determinants of health: N/A    Amount and/or Complexity of Data Reviewed Labs: ordered. Radiology: ordered.          Final Clinical Impression(s) / ED Diagnoses Final diagnoses:  Lightheadedness  Hypertension, unspecified type    Rx / DC Orders ED Discharge Orders     None         Kemper Durie, DO 07/30/22 1902

## 2022-08-03 ENCOUNTER — Telehealth: Payer: Self-pay

## 2022-08-03 NOTE — Telephone Encounter (Signed)
        Patient  visited [facility] on 10/22    Telephone encounter attempt :  1st  A HIPAA compliant voice message was left requesting a return call.  Instructed patient to call back     [facility]  [phone] [address], [place], [place] [address] Phone: [phone] Email: [email or web address]

## 2022-08-07 ENCOUNTER — Other Ambulatory Visit: Payer: Self-pay | Admitting: Family Medicine

## 2022-08-07 DIAGNOSIS — I10 Essential (primary) hypertension: Secondary | ICD-10-CM

## 2022-08-07 DIAGNOSIS — E038 Other specified hypothyroidism: Secondary | ICD-10-CM

## 2022-08-07 DIAGNOSIS — E7849 Other hyperlipidemia: Secondary | ICD-10-CM

## 2022-09-04 DIAGNOSIS — Z1283 Encounter for screening for malignant neoplasm of skin: Secondary | ICD-10-CM | POA: Diagnosis not present

## 2022-09-04 DIAGNOSIS — B078 Other viral warts: Secondary | ICD-10-CM | POA: Diagnosis not present

## 2022-09-04 DIAGNOSIS — D225 Melanocytic nevi of trunk: Secondary | ICD-10-CM | POA: Diagnosis not present

## 2022-09-12 ENCOUNTER — Telehealth: Payer: Self-pay | Admitting: Family Medicine

## 2022-09-12 NOTE — Telephone Encounter (Signed)
Attempted to contact - NA 

## 2022-09-19 NOTE — Telephone Encounter (Signed)
We are able to leave a detailed message on home vmail. Informed pt that is 2nd shingles shot is due anytime now. Advised to call for a nurse visit to receive or he can wait until his next visit with Dr. Warrick Parisian.

## 2022-09-20 ENCOUNTER — Ambulatory Visit (INDEPENDENT_AMBULATORY_CARE_PROVIDER_SITE_OTHER): Payer: Medicare Other | Admitting: *Deleted

## 2022-09-20 DIAGNOSIS — Z23 Encounter for immunization: Secondary | ICD-10-CM

## 2022-09-20 NOTE — Progress Notes (Signed)
Shingrix given and patient tolerated well.  

## 2022-10-10 ENCOUNTER — Telehealth: Payer: Self-pay | Admitting: Family Medicine

## 2022-10-10 NOTE — Telephone Encounter (Signed)
Left message for patient to call back and schedule Medicare Annual Wellness Visit (AWV) to be completed by video or phone.   Last AWV: 06/15/2021   Please schedule at anytime with [place]     45 minute appointment  Any questions, please contact me at [phone]   Thank you,   [facility] Clinical Support for [place] [facility] ??[phone] or ??[phone]

## 2022-10-24 DIAGNOSIS — M779 Enthesopathy, unspecified: Secondary | ICD-10-CM | POA: Diagnosis not present

## 2022-10-24 DIAGNOSIS — M79671 Pain in right foot: Secondary | ICD-10-CM | POA: Diagnosis not present

## 2022-11-01 ENCOUNTER — Encounter: Payer: Self-pay | Admitting: Family Medicine

## 2022-11-01 ENCOUNTER — Telehealth (INDEPENDENT_AMBULATORY_CARE_PROVIDER_SITE_OTHER): Payer: Medicare Other | Admitting: Family Medicine

## 2022-11-01 DIAGNOSIS — Z20822 Contact with and (suspected) exposure to covid-19: Secondary | ICD-10-CM | POA: Diagnosis not present

## 2022-11-01 DIAGNOSIS — J069 Acute upper respiratory infection, unspecified: Secondary | ICD-10-CM | POA: Diagnosis not present

## 2022-11-01 NOTE — Progress Notes (Signed)
   Virtual Visit  Note Due to COVID-19 pandemic this visit was conducted virtually. This visit type was conducted due to national recommendations for restrictions regarding the COVID-19 Pandemic (e.g. social distancing, sheltering in place) in an effort to limit this patient's exposure and mitigate transmission in our community. All issues noted in this document were discussed and addressed.  A physical exam was not performed with this format.  I connected with Troy Acosta on 11/01/22 at 13:08 by telephone and verified that I am speaking with the correct person using two identifiers. Troy Acosta is currently located at home and his wife is currently with him during the visit. The provider, Gwenlyn Perking, FNP is located in their office at time of visit.  I discussed the limitations, risks, security and privacy concerns of performing an evaluation and management service by telephone and the availability of in person appointments. I also discussed with the patient that there may be a patient responsible charge related to this service. The patient expressed understanding and agreed to proceed.  Unable to connect via video. Visit was done with telephone audio only.   CC: sore throat  History and Present Illness:  HPI Troy Acosta reports nasal congestion, sore throat, and cough that started last night. Cough is non productive. He denies fever, chills, myalgias, nausea, vomiting, or diarrhea. Denies chest pain or shortness of breath. He has not tried any remedies. His wife tested positive for Covid yesterday with a home test. He took a test last night and this was negative.    ROS As per HPI.   Observations/Objective: Alert and oriented x 3. Able to speak in full sentences without difficulty.    Assessment and Plan: Troy Acosta was seen today for sore throat.  Diagnoses and all orders for this visit:  Viral URI Close exposure to COVID-19 virus Discussed viral URI likely due to Covid 19. He will retest with  a home Covid test tonight. He declined a PCR test at the office. He will notify the office if positive. Discussed antiviral therapy if positive. He uses [facility] in [place]. Discussed symptomatic care and return precautions.    Follow Up Instructions: Return to office for new or worsening symptoms, or if symptoms persist.     I discussed the assessment and treatment plan with the patient. The patient was provided an opportunity to ask questions and all were answered. The patient agreed with the plan and demonstrated an understanding of the instructions.   The patient was advised to call back or seek an in-person evaluation if the symptoms worsen or if the condition fails to improve as anticipated.  The above assessment and management plan was discussed with the patient. The patient verbalized understanding of and has agreed to the management plan. Patient is aware to call the clinic if symptoms persist or worsen. Patient is aware when to return to the clinic for a follow-up visit. Patient educated on when it is appropriate to go to the emergency department.   Time call ended:  1320  I provided 12 minutes of  non face-to-face time during this encounter.    Gwenlyn Perking, FNP

## 2022-11-02 ENCOUNTER — Telehealth: Payer: Self-pay | Admitting: Family Medicine

## 2022-11-02 ENCOUNTER — Telehealth (INDEPENDENT_AMBULATORY_CARE_PROVIDER_SITE_OTHER): Payer: Medicare Other | Admitting: Family Medicine

## 2022-11-02 ENCOUNTER — Encounter: Payer: Self-pay | Admitting: Family Medicine

## 2022-11-02 DIAGNOSIS — U071 COVID-19: Secondary | ICD-10-CM

## 2022-11-02 MED ORDER — NIRMATRELVIR/RITONAVIR (PAXLOVID)TABLET: 3.0000 | ORAL_TABLET | Freq: Two times a day (BID) | ORAL | 0 refills | Status: DC

## 2022-11-02 MED ORDER — MOLNUPIRAVIR EUA 200MG CAPSULE: 4.0000 | ORAL_CAPSULE | Freq: Two times a day (BID) | ORAL | 0 refills | Status: AC

## 2022-11-02 NOTE — Telephone Encounter (Signed)
I've sent in molnupriavir instead. This is the other antiviral treatment for Covid. If this is too expensive, there are not any other antiviral options and he can manage symptoms with OTC medications.

## 2022-11-02 NOTE — Telephone Encounter (Signed)
Pt has already picked it up and it didn't cost anything.

## 2022-11-02 NOTE — Progress Notes (Signed)
   Virtual Visit  Note Due to COVID-19 pandemic this visit was conducted virtually. This visit type was conducted due to national recommendations for restrictions regarding the COVID-19 Pandemic (e.g. social distancing, sheltering in place) in an effort to limit this patient's exposure and mitigate transmission in our community. All issues noted in this document were discussed and addressed.  A physical exam was not performed with this format.  I connected with Troy Acosta on 11/02/22 at 9:40 by telephone and verified that I am speaking with the correct person using two identifiers. Troy Acosta is currently located at home and his wife is currently with him during the visit. The provider, Gwenlyn Perking, FNP is located in their office at time of visit.  I discussed the limitations, risks, security and privacy concerns of performing an evaluation and management service by telephone and the availability of in person appointments. I also discussed with the patient that there may be a patient responsible charge related to this service. The patient expressed understanding and agreed to proceed.  Unable to connect via video. Visit by telephone audio only.   CC: Covid   History and Present Illness:  HPI Troy Acosta repost a positive home Covid test this morning. He reports cough, congestion, and chills x 2 days. Denies fever, chest pain, or shortness of breath. He had a sore throat, but this has now resolved. He reports that his symptoms have been improving. He has not been taking any remedies. He is interested in antiviral therapy and is a higher risk due to age and hx of HTN and cardiomyopathy.    ROS As per HPI.   Observations/Objective: Alert and oriented x 3. Able to speak in full sentences without difficulty.   Assessment and Plan: Troy Acosta was seen today for covid positive.  Diagnoses and all orders for this visit:  COVID Positive home test with symptoms x 2 days. Paxlovid discussed and ordered as  below. Can continue rosuvastatin 5 mg with paxlovid. Discussed return precautions symptomatic care.  -     nirmatrelvir/ritonavir (PAXLOVID) 20 x 150 MG & 10 x '100MG'$  TABS; Take 3 tablets by mouth 2 (two) times daily for 5 days. (Take nirmatrelvir 150 mg two tablets twice daily for 5 days and ritonavir 100 mg one tablet twice daily for 5 days) Patient GFR is 71  Follow Up Instructions: Return to office for new or worsening symptoms, or if symptoms persist.     I discussed the assessment and treatment plan with the patient. The patient was provided an opportunity to ask questions and all were answered. The patient agreed with the plan and demonstrated an understanding of the instructions.   The patient was advised to call back or seek an in-person evaluation if the symptoms worsen or if the condition fails to improve as anticipated.  The above assessment and management plan was discussed with the patient. The patient verbalized understanding of and has agreed to the management plan. Patient is aware to call the clinic if symptoms persist or worsen. Patient is aware when to return to the clinic for a follow-up visit. Patient educated on when it is appropriate to go to the emergency department.   Time call ended:  0952  I provided 12 minutes of  non face-to-face time during this encounter.   Gwenlyn Perking, FNP

## 2022-11-23 ENCOUNTER — Encounter: Payer: Self-pay | Admitting: Cardiovascular Disease

## 2022-11-23 ENCOUNTER — Ambulatory Visit: Payer: Medicare Other | Attending: Cardiovascular Disease | Admitting: Cardiovascular Disease

## 2022-11-23 VITALS — BP 130/75 | HR 72 | Ht 71.0 in | Wt 252.0 lb

## 2022-11-23 DIAGNOSIS — H524 Presbyopia: Secondary | ICD-10-CM | POA: Diagnosis not present

## 2022-11-23 DIAGNOSIS — H2513 Age-related nuclear cataract, bilateral: Secondary | ICD-10-CM | POA: Diagnosis not present

## 2022-11-23 DIAGNOSIS — H35363 Drusen (degenerative) of macula, bilateral: Secondary | ICD-10-CM | POA: Diagnosis not present

## 2022-11-23 DIAGNOSIS — I480 Paroxysmal atrial fibrillation: Secondary | ICD-10-CM

## 2022-11-23 DIAGNOSIS — H40013 Open angle with borderline findings, low risk, bilateral: Secondary | ICD-10-CM | POA: Diagnosis not present

## 2022-11-23 DIAGNOSIS — H25013 Cortical age-related cataract, bilateral: Secondary | ICD-10-CM | POA: Diagnosis not present

## 2022-11-23 NOTE — Progress Notes (Signed)
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  Since last being seen in our clinic, the patient reports doing very well.  Today, he denies symptoms of palpitations, chest pain, shortness of breath,  lower extremity edema, dizziness, presyncope, or syncope.  The patient is otherwise without complaint today.   Past Medical History:  Diagnosis Date   Anxiety    Arthritis    "all my joints" (08/24/2017)   Diaphragmatic hernia without mention of obstruction or gangrene    Dysrhythmia    afib, ablation- 12/2015, no problems since, off blood thinner- 09/2016   Essential hypertension, benign    Headache    had MRI- was consulted with Dr. Jannifer Franklin,, headache has resolved"probably related to caffeine"   Hyperplasia of prostate    Iron deficiency anemia, unspecified    Malaise and fatigue    Obesity    Other and unspecified hyperlipidemia    Paroxysmal atrial fibrillation (HCC)    PONV (postoperative nausea and vomiting)    Unspecified hypothyroidism    Wears dentures    Past Surgical History:  Procedure Laterality Date   COLONOSCOPY WITH ESOPHAGOGASTRODUODENOSCOPY (EGD)     ELECTROPHYSIOLOGIC STUDY N/A 12/24/2015   Afib ablation by Dr Rayann Heman   JOINT REPLACEMENT     KNEE ARTHROSCOPY Right ~ 2016   [place]   "no problems identified"   MULTIPLE TOOTH EXTRACTIONS     TOTAL KNEE ARTHROPLASTY Right 03/02/2017   Procedure: RIGHT TOTAL KNEE ARTHROPLASTY;  Surgeon: [name], MD;  Location: [facility];  Service: Orthopedics;  Laterality: Right;   TOTAL KNEE ARTHROPLASTY Left 08/24/2017   TOTAL KNEE ARTHROPLASTY Left 08/24/2017   Procedure: LEFT TOTAL KNEE ARTHROPLASTY;  Surgeon: [name], MD;  Location: [facility];  Service: Orthopedics;  Laterality: Left;    ROS- all systems are reviewed and negatives except as per HPI above  Current Outpatient Medications  Medication Sig Dispense  Refill   acetaminophen (TYLENOL) 500 MG tablet Take 500 mg by mouth in the morning and at bedtime.     allopurinol (ZYLOPRIM) 100 MG tablet TAKE 2 TABLETS BY MOUTH DAILY 200 tablet 1   amLODipine (NORVASC) 5 MG tablet TAKE 1 TABLET BY MOUTH DAILY 100 tablet 1   aspirin EC 81 MG tablet Take 81 mg by mouth daily.     FERREX 150 150 MG capsule Take 1 capsule by mouth once daily 90 capsule 1   hydrochlorothiazide (HYDRODIURIL) 25 MG tablet TAKE 1 TABLET BY MOUTH DAILY 100 tablet 1   levothyroxine (SYNTHROID) 200 MCG tablet TAKE 1 TABLET BY MOUTH DAILY  BEFORE BREAKFAST 100 tablet 2   olmesartan (BENICAR) 40 MG tablet Take 0.5 tablets (20 mg total) by mouth 2 (two) times daily. 180 tablet 3   rosuvastatin (CRESTOR) 5 MG tablet TAKE 1 TABLET BY MOUTH DAILY 100 tablet 1   vitamin B-12 (CYANOCOBALAMIN) 100 MCG tablet Take 100 mcg by mouth daily.     Vitamin D, Cholecalciferol, 25 MCG (1000 UT) CAPS Take by mouth.     No current facility-administered medications for this visit.   Facility-Administered Medications Ordered in Other Visits  Medication Dose Route Frequency Provider Last Rate Last Admin   gadopentetate dimeglumine (MAGNEVIST) injection 20 mL  20 mL Intravenous Once PRN Kathrynn Ducking, MD        Physical Exam: Vitals:   11/23/22 1512  BP: 130/75  Pulse: 72  Weight: 252 lb (114.3 kg)  Height: 5' 11"$  (1.803 m)    Gen: Appears comfortable, well-nourished CV: RRR, no dependent edema Pulm: breathing easily   Wt Readings from Last 3 Encounters:  11/23/22 252 lb (114.3 kg)  07/30/22 255 lb (115.7 kg)  06/19/22 252 lb (114.3 kg)    Assessment and Plan:  Paroxysmal atrial fibrillation Well controlled post ablation off AAD Chads2vasc score is 2.  He has carefully decided to stop [place] therapy, recognizing risks of stroke vs risks of bleeding on [place]  2. HTN Stable No change required today  3. Overweight Body mass index is 35.15 kg/m. Lifestyle modification advised  4.  Tachycardia mediated CM Resolved   Return in a year  Melida Quitter, MD 11/23/2022 3:38 PM

## 2022-11-23 NOTE — Patient Instructions (Signed)
Medication Instructions:  Continue all current medications.  Labwork: none  Testing/Procedures: none  Follow-Up: 1 year - Dr.  Mealor    Any Other Special Instructions Will Be Listed Below (If Applicable).   If you need a refill on your cardiac medications before your next appointment, please call your pharmacy.  

## 2022-12-08 DIAGNOSIS — M79671 Pain in right foot: Secondary | ICD-10-CM | POA: Diagnosis not present

## 2022-12-08 DIAGNOSIS — M19071 Primary osteoarthritis, right ankle and foot: Secondary | ICD-10-CM | POA: Diagnosis not present

## 2022-12-11 ENCOUNTER — Telehealth: Payer: Self-pay | Admitting: Family Medicine

## 2022-12-11 NOTE — Telephone Encounter (Signed)
Called patient to schedule Medicare Annual Wellness Visit (AWV). No voicemail available to leave a message.  Last date of AWV: 06/15/2021   Please schedule an appointment at any time with either Mickel Baas or [place], [facility]. .  If any questions, please contact me at [phone].  Thank you,  Colletta [place],  [facility] Direct Dial ??[phone]

## 2022-12-20 ENCOUNTER — Encounter: Payer: Self-pay | Admitting: Family Medicine

## 2022-12-20 ENCOUNTER — Ambulatory Visit (INDEPENDENT_AMBULATORY_CARE_PROVIDER_SITE_OTHER): Payer: Medicare Other | Admitting: Family Medicine

## 2022-12-20 VITALS — BP 133/79 | HR 63 | Ht 71.0 in | Wt 249.0 lb

## 2022-12-20 DIAGNOSIS — Z1211 Encounter for screening for malignant neoplasm of colon: Secondary | ICD-10-CM | POA: Diagnosis not present

## 2022-12-20 DIAGNOSIS — E7849 Other hyperlipidemia: Secondary | ICD-10-CM

## 2022-12-20 DIAGNOSIS — E038 Other specified hypothyroidism: Secondary | ICD-10-CM

## 2022-12-20 DIAGNOSIS — I10 Essential (primary) hypertension: Secondary | ICD-10-CM | POA: Diagnosis not present

## 2022-12-20 DIAGNOSIS — Z131 Encounter for screening for diabetes mellitus: Secondary | ICD-10-CM | POA: Diagnosis not present

## 2022-12-20 DIAGNOSIS — R739 Hyperglycemia, unspecified: Secondary | ICD-10-CM | POA: Diagnosis not present

## 2022-12-20 LAB — LIPID PANEL

## 2022-12-20 LAB — BAYER DCA HB A1C WAIVED: HB A1C (BAYER DCA - WAIVED): 5.7 % — ABNORMAL HIGH (ref 4.8–5.6)

## 2022-12-20 MED ORDER — ALLOPURINOL 100 MG PO TABS: 200.0000 mg | ORAL_TABLET | Freq: Every day | ORAL | 1 refills | Status: DC

## 2022-12-20 MED ORDER — HYDROCHLOROTHIAZIDE 25 MG PO TABS: 25.0000 mg | ORAL_TABLET | Freq: Every day | ORAL | 3 refills | Status: DC

## 2022-12-20 MED ORDER — ROSUVASTATIN CALCIUM 5 MG PO TABS: 5.0000 mg | ORAL_TABLET | Freq: Every day | ORAL | 3 refills | Status: DC

## 2022-12-20 MED ORDER — AMLODIPINE BESYLATE 5 MG PO TABS: 5.0000 mg | ORAL_TABLET | Freq: Every day | ORAL | 3 refills | Status: DC

## 2022-12-20 MED ORDER — OLMESARTAN MEDOXOMIL 40 MG PO TABS: 20.0000 mg | ORAL_TABLET | Freq: Two times a day (BID) | ORAL | 3 refills | Status: DC

## 2022-12-20 NOTE — Progress Notes (Signed)
BP 133/79   Pulse 63   Ht '5\' 11"'$  (1.803 m)   Wt 249 lb (112.9 kg)   SpO2 97%   BMI 34.73 kg/m    Subjective:   Patient ID: Troy Acosta, male    DOB: [birth year], 72 y.o.   MRN: [id number]  HPI: Troy Acosta is a 72 y.o. male presenting on 12/20/2022 for Medical Management of Chronic Issues, Hypertension, and Hypothyroidism   HPI Hypothyroidism recheck Patient is coming in for thyroid recheck today as well. They deny any issues with hair changes or heat or cold problems or diarrhea or constipation. They deny any chest pain or palpitations. They are currently on levothyroxine 200 micrograms   Hypertension Patient is currently on amlodipine and hydrochlorothiazide and olmesartan, and their blood pressure today is 133/79. Patient denies any lightheadedness or dizziness. Patient denies headaches, blurred vision, chest pains, shortness of breath, or weakness. Denies any side effects from medication and is content with current medication.   Hyperlipidemia Patient is coming in for recheck of his hyperlipidemia. The patient is currently taking Crestor. They deny any issues with myalgias or history of liver damage from it. They deny any focal numbness or weakness or chest pain.   Relevant past medical, surgical, family and social history reviewed and updated as indicated. Interim medical history since our last visit reviewed. Allergies and medications reviewed and updated.  Review of Systems  Constitutional:  Negative for chills and fever.  Eyes:  Negative for visual disturbance.  Respiratory:  Negative for shortness of breath and wheezing.   Cardiovascular:  Negative for chest pain and leg swelling.  Musculoskeletal:  Negative for back pain and gait problem.  Skin:  Negative for rash.  Neurological:  Negative for dizziness, weakness and light-headedness.  All other systems reviewed and are negative.   Per HPI unless specifically indicated above   Allergies as of 12/20/2022   No Known  Allergies      Medication List        Accurate as of December 20, 2022  8:29 AM. If you have any questions, ask your nurse or doctor.          acetaminophen 500 MG tablet Commonly known as: TYLENOL Take 500 mg by mouth in the morning and at bedtime.   allopurinol 100 MG tablet Commonly known as: ZYLOPRIM Take 2 tablets (200 mg total) by mouth daily.   amLODipine 5 MG tablet Commonly known as: NORVASC Take 1 tablet (5 mg total) by mouth daily.   aspirin EC 81 MG tablet Take 81 mg by mouth daily.   Ferrex 150 150 MG capsule Generic drug: iron polysaccharides Take 1 capsule by mouth once daily   hydrochlorothiazide 25 MG tablet Commonly known as: HYDRODIURIL Take 1 tablet (25 mg total) by mouth daily.   levothyroxine 200 MCG tablet Commonly known as: SYNTHROID TAKE 1 TABLET BY MOUTH DAILY  BEFORE BREAKFAST   olmesartan 40 MG tablet Commonly known as: BENICAR Take 0.5 tablets (20 mg total) by mouth 2 (two) times daily.   rosuvastatin 5 MG tablet Commonly known as: CRESTOR Take 1 tablet (5 mg total) by mouth daily.   vitamin B-12 100 MCG tablet Commonly known as: CYANOCOBALAMIN Take 100 mcg by mouth daily.   Vitamin D (Cholecalciferol) 25 MCG (1000 UT) Caps Take by mouth.         Objective:   BP 133/79   Pulse 63   Ht '5\' 11"'$  (1.803 m)   Wt 249 lb (112.9 kg)  SpO2 97%   BMI 34.73 kg/m   Wt Readings from Last 3 Encounters:  12/20/22 249 lb (112.9 kg)  11/23/22 252 lb (114.3 kg)  07/30/22 255 lb (115.7 kg)    Physical Exam Vitals and nursing note reviewed.  Constitutional:      General: He is not in acute distress.    Appearance: He is well-developed. He is not diaphoretic.  Eyes:     General: No scleral icterus.    Conjunctiva/sclera: Conjunctivae normal.  Neck:     Thyroid: No thyromegaly.  Cardiovascular:     Rate and Rhythm: Normal rate and regular rhythm.     Heart sounds: Normal heart sounds. No murmur heard. Pulmonary:     Effort:  Pulmonary effort is normal. No respiratory distress.     Breath sounds: Normal breath sounds. No wheezing.  Musculoskeletal:        General: Normal range of motion.     Cervical back: Neck supple.  Lymphadenopathy:     Cervical: No cervical adenopathy.  Skin:    General: Skin is warm and dry.     Findings: No rash.  Neurological:     Mental Status: He is alert and oriented to person, place, and time.     Coordination: Coordination normal.  Psychiatric:        Behavior: Behavior normal.       Assessment & Plan:   Problem List Items Addressed This Visit       Cardiovascular and Mediastinum   Essential hypertension   Relevant Medications   amLODipine (NORVASC) 5 MG tablet   hydrochlorothiazide (HYDRODIURIL) 25 MG tablet   olmesartan (BENICAR) 40 MG tablet   rosuvastatin (CRESTOR) 5 MG tablet   Other Relevant Orders   CBC with Differential/Platelet     Endocrine   Hypothyroidism - Primary   Relevant Orders   CBC with Differential/Platelet   TSH     Other   Hyperlipidemia   Relevant Medications   amLODipine (NORVASC) 5 MG tablet   hydrochlorothiazide (HYDRODIURIL) 25 MG tablet   olmesartan (BENICAR) 40 MG tablet   rosuvastatin (CRESTOR) 5 MG tablet   Other Relevant Orders   CBC with Differential/Platelet   CMP14+EGFR   Lipid panel   Other Visit Diagnoses     Diabetes mellitus screening       Relevant Orders   Bayer DCA Hb A1c Waived   Colon cancer screening       Relevant Orders   Fecal occult blood, imunochemical([facility])       Blood pressure seems to be doing well today.  Will do screening testing.  He asked for an A1c because he gets it checked every now and then. Follow up plan: Return in about 6 months (around 06/22/2023), or if symptoms worsen or fail to improve, for Physical and hypertension and thyroid and cholesterol recheck.  Counseling provided for all of the vaccine components Orders Placed This Encounter  Procedures   Fecal occult  blood, imunochemical([facility])   CBC with Differential/Platelet   CMP14+EGFR   Lipid panel   TSH   Bayer DCA Hb A1c Waived    Caryl Pina, MD [facility] 12/20/2022, 8:29 AM

## 2022-12-21 LAB — CBC WITH DIFFERENTIAL/PLATELET
Basophils Absolute: 0 10*3/uL (ref 0.0–0.2)
Basos: 0 %
EOS (ABSOLUTE): 0.2 10*3/uL (ref 0.0–0.4)
Eos: 2 %
Hematocrit: 46.6 % (ref 37.5–51.0)
Hemoglobin: 15.3 g/dL (ref 13.0–17.7)
Immature Grans (Abs): 0 10*3/uL (ref 0.0–0.1)
Immature Granulocytes: 0 %
Lymphocytes Absolute: 2.3 10*3/uL (ref 0.7–3.1)
Lymphs: 27 %
MCH: 28.9 pg (ref 26.6–33.0)
MCHC: 32.8 g/dL (ref 31.5–35.7)
MCV: 88 fL (ref 79–97)
Monocytes Absolute: 0.6 10*3/uL (ref 0.1–0.9)
Monocytes: 7 %
Neutrophils Absolute: 5.4 10*3/uL (ref 1.4–7.0)
Neutrophils: 64 %
Platelets: 226 10*3/uL (ref 150–450)
RBC: 5.3 x10E6/uL (ref 4.14–5.80)
RDW: 13.3 % (ref 11.6–15.4)
WBC: 8.5 10*3/uL (ref 3.4–10.8)

## 2022-12-21 LAB — CMP14+EGFR
ALT: 15 IU/L (ref 0–44)
AST: 14 IU/L (ref 0–40)
Albumin/Globulin Ratio: 1.6 (ref 1.2–2.2)
Albumin: 4.5 g/dL (ref 3.9–4.9)
Alkaline Phosphatase: 74 IU/L (ref 44–121)
BUN/Creatinine Ratio: 22 (ref 10–24)
BUN: 22 mg/dL (ref 8–27)
Bilirubin Total: 0.5 mg/dL (ref 0.0–1.2)
CO2: 25 mmol/L (ref 20–29)
Calcium: 9.5 mg/dL (ref 8.6–10.2)
Chloride: 100 mmol/L (ref 96–106)
Creatinine, Ser: 0.98 mg/dL (ref 0.76–1.27)
Globulin, Total: 2.9 g/dL (ref 1.5–4.5)
Glucose: 109 mg/dL — ABNORMAL HIGH (ref 70–99)
Potassium: 4.8 mmol/L (ref 3.5–5.2)
Sodium: 140 mmol/L (ref 134–144)
Total Protein: 7.4 g/dL (ref 6.0–8.5)
eGFR: 83 mL/min/{1.73_m2} (ref >59)

## 2022-12-21 LAB — LIPID PANEL
Chol/HDL Ratio: 3.1 ratio (ref 0.0–5.0)
Cholesterol, Total: 113 mg/dL (ref 100–199)
HDL: 36 mg/dL — ABNORMAL LOW (ref >39)
LDL Chol Calc (NIH): 58 mg/dL (ref 0–99)
Triglycerides: 103 mg/dL (ref 0–149)
VLDL Cholesterol Cal: 19 mg/dL (ref 5–40)

## 2022-12-21 LAB — TSH: TSH: 2.47 u[IU]/mL (ref 0.450–4.500)

## 2022-12-25 DIAGNOSIS — Z1211 Encounter for screening for malignant neoplasm of colon: Secondary | ICD-10-CM | POA: Diagnosis not present

## 2022-12-26 ENCOUNTER — Other Ambulatory Visit: Payer: Medicare Other

## 2022-12-27 LAB — FECAL OCCULT BLOOD, IMMUNOCHEMICAL: Fecal Occult Bld: NEGATIVE

## 2022-12-29 ENCOUNTER — Other Ambulatory Visit: Payer: Self-pay | Admitting: Family Medicine

## 2022-12-29 DIAGNOSIS — I10 Essential (primary) hypertension: Secondary | ICD-10-CM

## 2023-01-26 ENCOUNTER — Telehealth: Payer: Self-pay | Admitting: Family Medicine

## 2023-01-26 NOTE — Telephone Encounter (Signed)
Called patient to schedule Medicare Annual Wellness Visit (AWV). Left message for patient to call back and schedule Medicare Annual Wellness Visit (AWV).  Last date of AWV: 06/15/2021   Please schedule an appointment at any time with either Vernona Rieger or [place], NHA's. .  If any questions, please contact me at [phone].  Thank you,  Judeth Cornfield,  AMB Clinical Support [facility] AWV Program Direct Dial ??[phone]

## 2023-01-29 ENCOUNTER — Telehealth: Payer: Self-pay | Admitting: Family Medicine

## 2023-01-29 NOTE — Telephone Encounter (Signed)
Called patient to schedule Medicare Annual Wellness Visit (AWV). Left message for patient to call back and schedule Medicare Annual Wellness Visit (AWV).  Last date of AWV: 06/15/2021   Please schedule an appointment at any time with either Vernona Rieger or [place], NHA's. .  If any questions, please contact me at [phone].  Thank you,  Judeth Cornfield,  AMB Clinical Support [facility] AWV Program Direct Dial ??[phone]

## 2023-02-19 DIAGNOSIS — M545 Low back pain, unspecified: Secondary | ICD-10-CM | POA: Diagnosis not present

## 2023-03-08 DIAGNOSIS — L821 Other seborrheic keratosis: Secondary | ICD-10-CM | POA: Diagnosis not present

## 2023-03-15 DIAGNOSIS — M79671 Pain in right foot: Secondary | ICD-10-CM | POA: Diagnosis not present

## 2023-03-15 DIAGNOSIS — M25571 Pain in right ankle and joints of right foot: Secondary | ICD-10-CM | POA: Diagnosis not present

## 2023-04-03 DIAGNOSIS — M19071 Primary osteoarthritis, right ankle and foot: Secondary | ICD-10-CM | POA: Diagnosis not present

## 2023-04-20 ENCOUNTER — Other Ambulatory Visit: Payer: Self-pay | Admitting: Family Medicine

## 2023-04-20 DIAGNOSIS — E038 Other specified hypothyroidism: Secondary | ICD-10-CM

## 2023-06-21 ENCOUNTER — Ambulatory Visit (INDEPENDENT_AMBULATORY_CARE_PROVIDER_SITE_OTHER): Payer: Medicare Other | Admitting: Family Medicine

## 2023-06-21 ENCOUNTER — Encounter: Payer: Self-pay | Admitting: Family Medicine

## 2023-06-21 VITALS — BP 143/71 | HR 63 | Ht 71.0 in | Wt 249.0 lb

## 2023-06-21 DIAGNOSIS — D509 Iron deficiency anemia, unspecified: Secondary | ICD-10-CM | POA: Diagnosis not present

## 2023-06-21 DIAGNOSIS — Z0000 Encounter for general adult medical examination without abnormal findings: Secondary | ICD-10-CM

## 2023-06-21 DIAGNOSIS — I10 Essential (primary) hypertension: Secondary | ICD-10-CM | POA: Diagnosis not present

## 2023-06-21 DIAGNOSIS — E038 Other specified hypothyroidism: Secondary | ICD-10-CM

## 2023-06-21 DIAGNOSIS — Z0001 Encounter for general adult medical examination with abnormal findings: Secondary | ICD-10-CM

## 2023-06-21 DIAGNOSIS — Z23 Encounter for immunization: Secondary | ICD-10-CM

## 2023-06-21 DIAGNOSIS — E7849 Other hyperlipidemia: Secondary | ICD-10-CM

## 2023-06-21 DIAGNOSIS — N400 Enlarged prostate without lower urinary tract symptoms: Secondary | ICD-10-CM

## 2023-06-21 MED ORDER — POLYSACCHARIDE IRON COMPLEX 150 MG PO CAPS: 150.0000 mg | ORAL_CAPSULE | Freq: Every day | ORAL | 3 refills | Status: DC

## 2023-06-21 NOTE — Progress Notes (Signed)
BP (!) 143/71   Pulse 63   Ht 5\' 11"  (1.803 m)   Wt 249 lb (112.9 kg)   SpO2 96%   BMI 34.73 kg/m    Subjective:   Patient ID: Troy Acosta, male    DOB: 04/10/1951, 72 y.o.   MRN: [id number]  HPI: Troy Acosta is a 72 y.o. male presenting on 06/21/2023 for Medical Management of Chronic Issues (CPE), Hypertension, and Hypothyroidism   HPI Physical exam Patient denies any chest pain, shortness of breath, headaches or vision issues, abdominal complaints, diarrhea, nausea, vomiting, or joint issues.   Hypothyroidism recheck Patient is coming in for thyroid recheck today as well. They deny any issues with hair changes or heat or cold problems or diarrhea or constipation. They deny any chest pain or palpitations. They are currently on levothyroxine 200 micrograms   Hypertension Patient is currently on hydrochlorothiazide and amlodipine and olmesartan, and their blood pressure today is 143/71. Patient denies any lightheadedness or dizziness. Patient denies headaches, blurred vision, chest pains, shortness of breath, or weakness. Denies any side effects from medication and is content with current medication.   Hyperlipidemia Patient is coming in for recheck of his hyperlipidemia. The patient is currently taking Crestor. They deny any issues with myalgias or history of liver damage from it. They deny any focal numbness or weakness or chest pain.   BPH Patient is coming in for recheck on BPH Symptoms: Urinary frequency Medication: None Last PSA: 1 year ago, normal  Iron deficiency anemia recheck Patient is currently taking iron and has iron deficiency anemia and is coming in for recheck today.  He denies any lightheadedness or dizziness or blood in his stool.  He does get occasional constipation from iron but not too bad.  Relevant past medical, surgical, family and social history reviewed and updated as indicated. Interim medical history since our last visit reviewed. Allergies and  medications reviewed and updated.  Review of Systems  Constitutional:  Negative for chills and fever.  Eyes:  Negative for visual disturbance.  Respiratory:  Negative for shortness of breath and wheezing.   Cardiovascular:  Negative for chest pain and leg swelling.  Musculoskeletal:  Negative for back pain and gait problem.  Skin:  Negative for rash.  Neurological:  Negative for dizziness, weakness and light-headedness.  All other systems reviewed and are negative.   Per HPI unless specifically indicated above   Allergies as of 06/21/2023   No Known Allergies      Medication List        Accurate as of June 21, 2023  8:23 AM. If you have any questions, ask your nurse or doctor.          acetaminophen 500 MG tablet Commonly known as: TYLENOL Take 500 mg by mouth in the morning and at bedtime.   allopurinol 100 MG tablet Commonly known as: ZYLOPRIM Take 2 tablets (200 mg total) by mouth daily.   amLODipine 5 MG tablet Commonly known as: NORVASC Take 1 tablet (5 mg total) by mouth daily.   aspirin EC 81 MG tablet Take 81 mg by mouth daily.   hydrochlorothiazide 25 MG tablet Commonly known as: HYDRODIURIL Take 1 tablet (25 mg total) by mouth daily.   iron polysaccharides 150 MG capsule Commonly known as: Ferrex 150 Take 1 capsule (150 mg total) by mouth daily.   levothyroxine 200 MCG tablet Commonly known as: SYNTHROID TAKE 1 TABLET BY MOUTH DAILY  BEFORE BREAKFAST   olmesartan 40 MG tablet  Commonly known as: BENICAR Take 0.5 tablets (20 mg total) by mouth 2 (two) times daily.   rosuvastatin 5 MG tablet Commonly known as: CRESTOR Take 1 tablet (5 mg total) by mouth daily.   vitamin B-12 100 MCG tablet Commonly known as: CYANOCOBALAMIN Take 100 mcg by mouth daily.   Vitamin D (Cholecalciferol) 25 MCG (1000 UT) Caps Take by mouth.         Objective:   BP (!) 143/71   Pulse 63   Ht 5\' 11"  (1.803 m)   Wt 249 lb (112.9 kg)   SpO2 96%    BMI 34.73 kg/m   Wt Readings from Last 3 Encounters:  06/21/23 249 lb (112.9 kg)  12/20/22 249 lb (112.9 kg)  11/23/22 252 lb (114.3 kg)    Physical Exam Vitals and nursing note reviewed.  Constitutional:      General: He is not in acute distress.    Appearance: He is well-developed. He is not diaphoretic.  Eyes:     General: No scleral icterus.    Conjunctiva/sclera: Conjunctivae normal.  Neck:     Thyroid: No thyromegaly.  Cardiovascular:     Rate and Rhythm: Normal rate and regular rhythm.     Heart sounds: Normal heart sounds. No murmur heard. Pulmonary:     Effort: Pulmonary effort is normal. No respiratory distress.     Breath sounds: Normal breath sounds. No wheezing.  Musculoskeletal:        General: No swelling. Normal range of motion.     Cervical back: Neck supple.  Lymphadenopathy:     Cervical: No cervical adenopathy.  Skin:    General: Skin is warm and dry.     Findings: No rash.  Neurological:     Mental Status: He is alert and oriented to person, place, and time.     Coordination: Coordination normal.  Psychiatric:        Behavior: Behavior normal.       Assessment & Plan:   Problem List Items Addressed This Visit       Cardiovascular and Mediastinum   Essential hypertension   Relevant Medications   iron polysaccharides (FERREX 150) 150 MG capsule   Other Relevant Orders   CBC with Differential/Platelet   CMP14+EGFR   Bayer DCA Hb A1c Waived     Endocrine   Hypothyroidism   Relevant Orders   TSH     Genitourinary   BPH (benign prostatic hyperplasia)     Other   Iron deficiency anemia, unspecified   Relevant Medications   iron polysaccharides (FERREX 150) 150 MG capsule   Hyperlipidemia   Relevant Orders   Lipid panel   Other Visit Diagnoses     Physical exam    -  Primary   Relevant Orders   CBC with Differential/Platelet   CMP14+EGFR   Lipid panel   Bayer DCA Hb A1c Waived       Continue current medicine, seems to be  doing well.  Will check blood work.  He is going to come back another day fasting for blood work. Follow up plan: Return in about 6 months (around 12/19/2023), or if symptoms worsen or fail to improve, for Thyroid and hypertension and cholesterol recheck.  Counseling provided for all of the vaccine components Orders Placed This Encounter  Procedures   CBC with Differential/Platelet   CMP14+EGFR   Lipid panel   Bayer DCA Hb A1c Waived   TSH    Arville Care, MD [facility] 06/21/2023, 8:23 AM

## 2023-06-26 ENCOUNTER — Other Ambulatory Visit: Payer: Medicare Other

## 2023-06-26 DIAGNOSIS — I10 Essential (primary) hypertension: Secondary | ICD-10-CM | POA: Diagnosis not present

## 2023-06-26 DIAGNOSIS — Z0000 Encounter for general adult medical examination without abnormal findings: Secondary | ICD-10-CM | POA: Diagnosis not present

## 2023-06-26 DIAGNOSIS — E7849 Other hyperlipidemia: Secondary | ICD-10-CM | POA: Diagnosis not present

## 2023-06-26 DIAGNOSIS — E038 Other specified hypothyroidism: Secondary | ICD-10-CM | POA: Diagnosis not present

## 2023-06-26 DIAGNOSIS — R7309 Other abnormal glucose: Secondary | ICD-10-CM | POA: Diagnosis not present

## 2023-06-26 LAB — CBC WITH DIFFERENTIAL/PLATELET
Basophils Absolute: 0 10*3/uL (ref 0.0–0.2)
Basos: 0 %
EOS (ABSOLUTE): 0.3 10*3/uL (ref 0.0–0.4)
Eos: 3 %
Hematocrit: 48.7 % (ref 37.5–51.0)
Hemoglobin: 15.7 g/dL (ref 13.0–17.7)
Immature Grans (Abs): 0 10*3/uL (ref 0.0–0.1)
Immature Granulocytes: 0 %
Lymphocytes Absolute: 2.5 10*3/uL (ref 0.7–3.1)
Lymphs: 28 %
MCH: 29.2 pg (ref 26.6–33.0)
MCHC: 32.2 g/dL (ref 31.5–35.7)
MCV: 91 fL (ref 79–97)
Monocytes Absolute: 0.6 10*3/uL (ref 0.1–0.9)
Monocytes: 6 %
Neutrophils Absolute: 5.6 10*3/uL (ref 1.4–7.0)
Neutrophils: 63 %
Platelets: 226 10*3/uL (ref 150–450)
RBC: 5.38 x10E6/uL (ref 4.14–5.80)
RDW: 12.3 % (ref 11.6–15.4)
WBC: 9 10*3/uL (ref 3.4–10.8)

## 2023-06-26 LAB — CMP14+EGFR
ALT: 22 IU/L (ref 0–44)
AST: 16 IU/L (ref 0–40)
Albumin: 4.3 g/dL (ref 3.9–4.9)
Alkaline Phosphatase: 72 IU/L (ref 44–121)
BUN/Creatinine Ratio: 25 — ABNORMAL HIGH (ref 10–24)
BUN: 25 mg/dL (ref 8–27)
Bilirubin Total: 0.6 mg/dL (ref 0.0–1.2)
CO2: 26 mmol/L (ref 20–29)
Calcium: 9.6 mg/dL (ref 8.6–10.2)
Chloride: 101 mmol/L (ref 96–106)
Creatinine, Ser: 1.01 mg/dL (ref 0.76–1.27)
Globulin, Total: 2.9 g/dL (ref 1.5–4.5)
Glucose: 110 mg/dL — ABNORMAL HIGH (ref 70–99)
Potassium: 4.6 mmol/L (ref 3.5–5.2)
Sodium: 140 mmol/L (ref 134–144)
Total Protein: 7.2 g/dL (ref 6.0–8.5)
eGFR: 81 mL/min/{1.73_m2} (ref >59)

## 2023-06-26 LAB — LIPID PANEL
Chol/HDL Ratio: 3.3 ratio (ref 0.0–5.0)
Cholesterol, Total: 112 mg/dL (ref 100–199)
HDL: 34 mg/dL — ABNORMAL LOW (ref >39)
LDL Chol Calc (NIH): 56 mg/dL (ref 0–99)
Triglycerides: 123 mg/dL (ref 0–149)
VLDL Cholesterol Cal: 22 mg/dL (ref 5–40)

## 2023-06-26 LAB — TSH: TSH: 2.88 u[IU]/mL (ref 0.450–4.500)

## 2023-06-26 LAB — BAYER DCA HB A1C WAIVED: HB A1C (BAYER DCA - WAIVED): 5.5 % (ref 4.8–5.6)

## 2023-07-02 ENCOUNTER — Other Ambulatory Visit: Payer: Self-pay | Admitting: Family Medicine

## 2023-07-10 DIAGNOSIS — I10 Essential (primary) hypertension: Secondary | ICD-10-CM | POA: Diagnosis not present

## 2023-07-10 DIAGNOSIS — E039 Hypothyroidism, unspecified: Secondary | ICD-10-CM | POA: Diagnosis not present

## 2023-07-10 DIAGNOSIS — R7309 Other abnormal glucose: Secondary | ICD-10-CM | POA: Diagnosis not present

## 2023-07-10 DIAGNOSIS — E785 Hyperlipidemia, unspecified: Secondary | ICD-10-CM | POA: Diagnosis not present

## 2023-07-16 ENCOUNTER — Telehealth: Payer: Self-pay | Admitting: Family Medicine

## 2023-07-16 NOTE — Telephone Encounter (Signed)
levothyroxine (SYNTHROID) 200 MCG tablet  has new manufacturer. Please call back to ok to send to pt.

## 2023-07-16 NOTE — Telephone Encounter (Signed)
[facility] made aware.

## 2023-07-16 NOTE — Telephone Encounter (Signed)
Yes go ahead and call them back and let him know that is okay to send to the patient, we will just have to see how he is doing next time he comes in for a visit and recheck.

## 2023-08-10 DIAGNOSIS — M19071 Primary osteoarthritis, right ankle and foot: Secondary | ICD-10-CM | POA: Diagnosis not present

## 2023-10-15 ENCOUNTER — Other Ambulatory Visit: Payer: Self-pay | Admitting: Family Medicine

## 2023-10-15 DIAGNOSIS — I10 Essential (primary) hypertension: Secondary | ICD-10-CM

## 2023-10-15 DIAGNOSIS — E7849 Other hyperlipidemia: Secondary | ICD-10-CM

## 2023-10-18 DIAGNOSIS — M19071 Primary osteoarthritis, right ankle and foot: Secondary | ICD-10-CM | POA: Diagnosis not present

## 2023-10-18 DIAGNOSIS — M79671 Pain in right foot: Secondary | ICD-10-CM | POA: Diagnosis not present

## 2023-11-16 ENCOUNTER — Ambulatory Visit: Payer: Medicare Other | Admitting: Cardiovascular Disease

## 2023-11-23 ENCOUNTER — Ambulatory Visit: Payer: Medicare Other | Attending: Cardiovascular Disease | Admitting: Cardiovascular Disease

## 2023-11-23 ENCOUNTER — Encounter: Payer: Self-pay | Admitting: Cardiovascular Disease

## 2023-11-23 VITALS — BP 132/80 | HR 67 | Ht 71.0 in | Wt 257.4 lb

## 2023-11-23 DIAGNOSIS — I10 Essential (primary) hypertension: Secondary | ICD-10-CM

## 2023-11-23 NOTE — Progress Notes (Signed)
   PCP: Dettinger, Elige Radon, MD   Primary EP: Albeiro Trompeter  Troy Acosta is a 73 y.o. male who presents today for routine electrophysiology followup.  Since last being seen in our clinic, the patient reports doing very well.    Today, he denies symptoms of palpitations, chest pain, shortness of breath,  lower extremity edema, dizziness, presyncope, or syncope.  The patient is otherwise without complaint today.     Physical Exam: Vitals:   11/23/23 1555  BP: 132/80  Pulse: 67  SpO2: 95%  Weight: 257 lb 6.4 oz (116.8 kg)  Height: 5\' 11"  (1.803 m)    Gen: Appears comfortable, well-nourished CV: RRR, no dependent edema Pulm: breathing easily   Wt Readings from Last 3 Encounters:  11/23/23 257 lb 6.4 oz (116.8 kg)  06/21/23 249 lb (112.9 kg)  12/20/22 249 lb (112.9 kg)    Assessment and Plan:  Paroxysmal atrial fibrillation Well controlled post ablation off AAD Chads2vasc score is 2.  He is not on anticoagulation, confident that he has not had any recurrence of AF. He understands that there is some risk of stroke and continues on ASA 81. (See prior notes)  2. HTN Stable No change required today  3. Overweight Body mass index is 35.9 kg/m. Lifestyle modification advised  4. Tachycardia mediated CM Resolved   Return in a year  Maurice Small, MD 11/23/2023 4:49 PM

## 2023-11-23 NOTE — Patient Instructions (Addendum)

## 2023-11-30 DIAGNOSIS — H35033 Hypertensive retinopathy, bilateral: Secondary | ICD-10-CM | POA: Diagnosis not present

## 2023-11-30 DIAGNOSIS — H524 Presbyopia: Secondary | ICD-10-CM | POA: Diagnosis not present

## 2023-11-30 DIAGNOSIS — H35363 Drusen (degenerative) of macula, bilateral: Secondary | ICD-10-CM | POA: Diagnosis not present

## 2023-11-30 DIAGNOSIS — H40013 Open angle with borderline findings, low risk, bilateral: Secondary | ICD-10-CM | POA: Diagnosis not present

## 2023-11-30 DIAGNOSIS — H25813 Combined forms of age-related cataract, bilateral: Secondary | ICD-10-CM | POA: Diagnosis not present

## 2023-12-21 ENCOUNTER — Ambulatory Visit: Payer: Medicare Other | Admitting: Family Medicine

## 2023-12-21 ENCOUNTER — Encounter: Payer: Self-pay | Admitting: Family Medicine

## 2023-12-21 VITALS — BP 131/74 | HR 63 | Ht 71.0 in | Wt 252.0 lb

## 2023-12-21 DIAGNOSIS — E038 Other specified hypothyroidism: Secondary | ICD-10-CM

## 2023-12-21 DIAGNOSIS — I10 Essential (primary) hypertension: Secondary | ICD-10-CM | POA: Diagnosis not present

## 2023-12-21 DIAGNOSIS — I480 Paroxysmal atrial fibrillation: Secondary | ICD-10-CM | POA: Diagnosis not present

## 2023-12-21 DIAGNOSIS — E7849 Other hyperlipidemia: Secondary | ICD-10-CM

## 2023-12-21 DIAGNOSIS — D509 Iron deficiency anemia, unspecified: Secondary | ICD-10-CM

## 2023-12-21 DIAGNOSIS — N400 Enlarged prostate without lower urinary tract symptoms: Secondary | ICD-10-CM | POA: Diagnosis not present

## 2023-12-21 MED ORDER — ROSUVASTATIN CALCIUM 5 MG PO TABS: 5.0000 mg | ORAL_TABLET | Freq: Every day | ORAL | 3 refills | Status: DC

## 2023-12-21 MED ORDER — HYDROCHLOROTHIAZIDE 25 MG PO TABS: 25.0000 mg | ORAL_TABLET | Freq: Every day | ORAL | 3 refills | Status: AC

## 2023-12-21 MED ORDER — ALLOPURINOL 100 MG PO TABS: 200.0000 mg | ORAL_TABLET | Freq: Every day | ORAL | 3 refills | Status: AC

## 2023-12-21 MED ORDER — AMLODIPINE BESYLATE 5 MG PO TABS: 5.0000 mg | ORAL_TABLET | Freq: Every day | ORAL | 3 refills | Status: AC

## 2023-12-21 MED ORDER — OLMESARTAN MEDOXOMIL 40 MG PO TABS
20.0000 mg | ORAL_TABLET | Freq: Two times a day (BID) | ORAL | 3 refills | Status: AC
Start: 2023-12-21 — End: ?

## 2023-12-21 MED ORDER — LEVOTHYROXINE SODIUM 200 MCG PO TABS
200.0000 ug | ORAL_TABLET | Freq: Every day | ORAL | 3 refills | Status: AC
Start: 2023-12-21 — End: ?

## 2023-12-21 NOTE — Progress Notes (Signed)
 BP 131/74   Pulse 63   Ht 5\' 11"  (1.803 m)   Wt 252 lb (114.3 kg)   SpO2 96%   BMI 35.15 kg/m    Subjective:   Patient ID: Troy Acosta, male    DOB: 1950/11/02, 73 y.o.   MRN: [id number]  HPI: Troy Acosta is a 73 y.o. male presenting on 12/21/2023 for Medical Management of Chronic Issues, Hypothyroidism, Hypertension, and Hyperlipidemia   HPI Hypothyroidism recheck Patient is coming in for thyroid recheck today as well. They deny any issues with hair changes or heat or cold problems or diarrhea or constipation. They deny any chest pain or palpitations. They are currently on levothyroxine 200 micrograms   Hypertension and paroxysmal A-fib recheck, sees cardiology Patient is currently on amlodipine and hydrochlorothiazide and olmesartan, and their blood pressure today is 131/74. Patient denies any lightheadedness or dizziness. Patient denies headaches, blurred vision, chest pains, shortness of breath, or weakness. Denies any side effects from medication and is content with current medication.   Hyperlipidemia Patient is coming in for recheck of his hyperlipidemia. The patient is currently taking Crestor. They deny any issues with myalgias or history of liver damage from it. They deny any focal numbness or weakness or chest pain.   BPH Patient is coming in for recheck on BPH Symptoms: None currently Medication: None Last PSA: Over a year ago  Anemia recheck Patient has a history of iron deficiency anemia and recheck on that for him today.  He denies any bleeding or bruising.  He is still taking iron  Relevant past medical, surgical, family and social history reviewed and updated as indicated. Interim medical history since our last visit reviewed. Allergies and medications reviewed and updated.  Review of Systems  Constitutional:  Negative for chills and fever.  Eyes:  Negative for visual disturbance.  Respiratory:  Negative for shortness of breath and wheezing.   Cardiovascular:   Negative for chest pain and leg swelling.  Musculoskeletal:  Negative for back pain and gait problem.  Skin:  Negative for rash.  Neurological:  Negative for dizziness and light-headedness.  All other systems reviewed and are negative.   Per HPI unless specifically indicated above   Allergies as of 12/21/2023   No Known Allergies      Medication List        Accurate as of December 21, 2023  8:17 AM. If you have any questions, ask your nurse or doctor.          acetaminophen 500 MG tablet Commonly known as: TYLENOL Take 500 mg by mouth in the morning and at bedtime.   allopurinol 100 MG tablet Commonly known as: ZYLOPRIM Take 2 tablets (200 mg total) by mouth daily.   amLODipine 5 MG tablet Commonly known as: NORVASC Take 1 tablet (5 mg total) by mouth daily.   aspirin EC 81 MG tablet Take 81 mg by mouth daily.   hydrochlorothiazide 25 MG tablet Commonly known as: HYDRODIURIL Take 1 tablet (25 mg total) by mouth daily.   iron polysaccharides 150 MG capsule Commonly known as: Ferrex 150 Take 1 capsule (150 mg total) by mouth daily.   levothyroxine 200 MCG tablet Commonly known as: SYNTHROID Take 1 tablet (200 mcg total) by mouth daily before breakfast.   olmesartan 40 MG tablet Commonly known as: BENICAR Take 0.5 tablets (20 mg total) by mouth 2 (two) times daily.   rosuvastatin 5 MG tablet Commonly known as: CRESTOR Take 1 tablet (5 mg total) by  mouth daily.   vitamin B-12 100 MCG tablet Commonly known as: CYANOCOBALAMIN Take 100 mcg by mouth daily.   Vitamin D (Cholecalciferol) 25 MCG (1000 UT) Caps Take by mouth.         Objective:   BP 131/74   Pulse 63   Ht 5\' 11"  (1.803 m)   Wt 252 lb (114.3 kg)   SpO2 96%   BMI 35.15 kg/m   Wt Readings from Last 3 Encounters:  12/21/23 252 lb (114.3 kg)  11/23/23 257 lb 6.4 oz (116.8 kg)  06/21/23 249 lb (112.9 kg)    Physical Exam Vitals and nursing note reviewed.  Constitutional:       General: He is not in acute distress.    Appearance: He is well-developed. He is not diaphoretic.  Eyes:     General: No scleral icterus.    Conjunctiva/sclera: Conjunctivae normal.  Neck:     Thyroid: No thyromegaly.  Cardiovascular:     Rate and Rhythm: Normal rate and regular rhythm.     Heart sounds: Normal heart sounds. No murmur heard. Pulmonary:     Effort: Pulmonary effort is normal. No respiratory distress.     Breath sounds: Normal breath sounds. No wheezing.  Musculoskeletal:        General: No swelling. Normal range of motion.     Cervical back: Neck supple.  Lymphadenopathy:     Cervical: No cervical adenopathy.  Skin:    General: Skin is warm and dry.     Findings: No rash.  Neurological:     Mental Status: He is alert and oriented to person, place, and time.     Coordination: Coordination normal.  Psychiatric:        Behavior: Behavior normal.       Assessment & Plan:   Problem List Items Addressed This Visit       Cardiovascular and Mediastinum   Essential hypertension   Relevant Medications   amLODipine (NORVASC) 5 MG tablet   hydrochlorothiazide (HYDRODIURIL) 25 MG tablet   olmesartan (BENICAR) 40 MG tablet   rosuvastatin (CRESTOR) 5 MG tablet   Other Relevant Orders   CBC with Differential/Platelet   CMP14+EGFR   PAF (paroxysmal atrial fibrillation) (HCC)   Relevant Medications   amLODipine (NORVASC) 5 MG tablet   hydrochlorothiazide (HYDRODIURIL) 25 MG tablet   olmesartan (BENICAR) 40 MG tablet   rosuvastatin (CRESTOR) 5 MG tablet     Endocrine   Hypothyroidism - Primary   Relevant Medications   levothyroxine (SYNTHROID) 200 MCG tablet   Other Relevant Orders   TSH     Genitourinary   BPH (benign prostatic hyperplasia)   Relevant Orders   PSA, total and free     Other   Iron deficiency anemia, unspecified   Relevant Orders   CBC with Differential/Platelet   Hyperlipidemia   Relevant Medications   amLODipine (NORVASC) 5 MG  tablet   hydrochlorothiazide (HYDRODIURIL) 25 MG tablet   olmesartan (BENICAR) 40 MG tablet   rosuvastatin (CRESTOR) 5 MG tablet   Other Relevant Orders   Lipid panel    Blood pressure looks good today no change in medication, will check blood work today.   Follow up plan: Return in about 6 months (around 06/22/2024), or if symptoms worsen or fail to improve, for thyroid and hypertension and cholesterol.  Counseling provided for all of the vaccine components Orders Placed This Encounter  Procedures   PSA, total and free   CBC with Differential/Platelet  CMP14+EGFR   Lipid panel   TSH    Arville Care, MD [facility] 12/21/2023, 8:17 AM

## 2023-12-24 LAB — CBC WITH DIFFERENTIAL/PLATELET
Basophils Absolute: 0 10*3/uL (ref 0.0–0.2)
Basos: 0 %
EOS (ABSOLUTE): 0.1 10*3/uL (ref 0.0–0.4)
Eos: 2 %
Hematocrit: 46.1 % (ref 37.5–51.0)
Hemoglobin: 15.5 g/dL (ref 13.0–17.7)
Immature Grans (Abs): 0 10*3/uL (ref 0.0–0.1)
Immature Granulocytes: 0 %
Lymphocytes Absolute: 2.3 10*3/uL (ref 0.7–3.1)
Lymphs: 29 %
MCH: 29.7 pg (ref 26.6–33.0)
MCHC: 33.6 g/dL (ref 31.5–35.7)
MCV: 88 fL (ref 79–97)
Monocytes Absolute: 0.6 10*3/uL (ref 0.1–0.9)
Monocytes: 8 %
Neutrophils Absolute: 5 10*3/uL (ref 1.4–7.0)
Neutrophils: 61 %
Platelets: 218 10*3/uL (ref 150–450)
RBC: 5.22 x10E6/uL (ref 4.14–5.80)
RDW: 12.8 % (ref 11.6–15.4)
WBC: 8.1 10*3/uL (ref 3.4–10.8)

## 2023-12-24 LAB — PSA, TOTAL AND FREE
PSA, Free Pct: 14.7 %
PSA, Free: 1.34 ng/mL
Prostate Specific Ag, Serum: 9.1 ng/mL — ABNORMAL HIGH (ref 0.0–4.0)

## 2023-12-24 LAB — LIPID PANEL
Chol/HDL Ratio: 3.3 ratio (ref 0.0–5.0)
Cholesterol, Total: 113 mg/dL (ref 100–199)
HDL: 34 mg/dL — ABNORMAL LOW (ref >39)
LDL Chol Calc (NIH): 58 mg/dL (ref 0–99)
Triglycerides: 112 mg/dL (ref 0–149)
VLDL Cholesterol Cal: 21 mg/dL (ref 5–40)

## 2023-12-24 LAB — CMP14+EGFR
ALT: 20 IU/L (ref 0–44)
AST: 18 IU/L (ref 0–40)
Albumin: 4.4 g/dL (ref 3.9–4.9)
Alkaline Phosphatase: 72 IU/L (ref 44–121)
BUN/Creatinine Ratio: 20 (ref 10–24)
BUN: 20 mg/dL (ref 8–27)
Bilirubin Total: 0.5 mg/dL (ref 0.0–1.2)
CO2: 26 mmol/L (ref 20–29)
Calcium: 9.8 mg/dL (ref 8.6–10.2)
Chloride: 99 mmol/L (ref 96–106)
Creatinine, Ser: 1.01 mg/dL (ref 0.76–1.27)
Globulin, Total: 2.9 g/dL (ref 1.5–4.5)
Glucose: 107 mg/dL — ABNORMAL HIGH (ref 70–99)
Potassium: 4.7 mmol/L (ref 3.5–5.2)
Sodium: 139 mmol/L (ref 134–144)
Total Protein: 7.3 g/dL (ref 6.0–8.5)
eGFR: 80 mL/min/{1.73_m2} (ref >59)

## 2023-12-24 LAB — TSH: TSH: 1.58 u[IU]/mL (ref 0.450–4.500)

## 2023-12-25 DIAGNOSIS — M19071 Primary osteoarthritis, right ankle and foot: Secondary | ICD-10-CM | POA: Diagnosis not present

## 2023-12-27 ENCOUNTER — Encounter: Payer: Self-pay | Admitting: Family Medicine

## 2023-12-27 ENCOUNTER — Telehealth: Payer: Self-pay | Admitting: Family Medicine

## 2023-12-27 DIAGNOSIS — R972 Elevated prostate specific antigen [PSA]: Secondary | ICD-10-CM

## 2023-12-27 NOTE — Telephone Encounter (Signed)
 Copied from CRM [id number]. Topic: Clinical - Lab/Test Results >> Dec 27, 2023 12:07 PM Elle L wrote: Reason for CRM: The patient returned the call regarding his lab work. I read the note verbatim. He does not have a Insurance underwriter. I advised him that Dr. Louanne Skye will be placing a referral for him. He expressed understanding and had no further questions at this time.

## 2023-12-27 NOTE — Telephone Encounter (Signed)
 Referral to urology placed

## 2024-01-08 ENCOUNTER — Ambulatory Visit: Payer: Self-pay

## 2024-01-08 NOTE — Telephone Encounter (Signed)
 Chief Complaint: vomiting Symptoms: nausea, vomiting, diarrhea Frequency: started last night at 11:30pm Pertinent Negatives: Patient denies fever, abd pain, CP, SOB, palpitations Disposition: [x] ED /[] Urgent Care (no appt availability in office) / [] Appointment(In office/virtual)/ []  [facility]/ [] Home Care/ [x] Refused Recommended Disposition /[]  Mobile Bus/ []  Follow-up with PCP Additional Notes: RN spoke to pt's wife. RN could hear pt in the background. Per wife, pt developed diarrhea last night at 2330. Wife states diarrhea was constant. Pt was also nauseous at that time. Then pt began vomiting. Pt states he vomited 6-7x between the time it started and this morning. Wife states it may have been more than that. Diarrhea resolved. Pt ate crackers today and vomited after. Ate a small piece of grilled cheese. Pt is sipping a ginger ale. Denies abd pain, CP, SOB, palpitations. Pt endorses weakness. RN advised pt he go to the ED. Pt declined at this time. Pt's wife inquires if the PCP could prescribe pt Zofran without the pt coming into the office. RN educated pt and wife why the ED is the best place for his symptoms to be addressed. RN advised wife RN would relay his symptoms and concern to the office for follow-up.  Please follow-up with the patient.    Reason for Triage: patient started vomiting and diarrhea last night around 11pm - unable to eat without vomiting, no fever- [phone]  Reason for Disposition  [1] SEVERE vomiting (e.g., 6 or more times/day) AND [2] present > 8 hours (Exception: Patient sounds well, is drinking liquids, does not sound dehydrated, and vomiting has lasted less than 24 hours.)  Answer Assessment - Initial Assessment Questions 1. VOMITING SEVERITY: "How many times have you vomited in the past 24 hours?"     - MILD:  1 - 2 times/day    - MODERATE: 3 - 5 times/day, decreased oral intake without significant weight loss or symptoms of dehydration     - SEVERE: 6 or more times/day, vomits everything or nearly everything, with significant weight loss, symptoms of dehydration      6-7x/day or more since it started late last night/early AM today 2. ONSET: "When did the vomiting begin?"      Last night and early AM today. Started as diarrhea first. 3. FLUIDS: "What fluids or food have you vomited up today?" "Have you been able to keep any fluids down?"     Threw up crackers. Only ate a small piece of grilled cheese. Keeping down ginger ale at this time. 4. ABDOMEN PAIN: "Are your having any abdomen pain?" If Yes : "How bad is it and what does it feel like?" (e.g., crampy, dull, intermittent, constant)      None, just nauseous  5. DIARRHEA: "Is there any diarrhea?" If Yes, ask: "How many times today?"      Diarrhea last night at 11:30pm, which has since resolved. Diarrhea was constant once it started. 6. CONTACTS: "Is there anyone else in the family with the same symptoms?"      No  7. CAUSE: "What do you think is causing your vomiting?"     Not sure 8. HYDRATION STATUS: "Any signs of dehydration?" (e.g., dry mouth [not only dry lips], too weak to stand) "When did you last urinate?"     Keeping ginger ale down, but he is thirsty. Endorses weakness. 9. OTHER SYMPTOMS: "Do you have any other symptoms?" (e.g., fever, headache, vertigo, vomiting blood or coffee grounds, recent head injury)     Nausea  Protocols  used: Vomiting-A-AH

## 2024-02-11 ENCOUNTER — Encounter: Payer: Self-pay | Admitting: Urology

## 2024-02-11 ENCOUNTER — Ambulatory Visit: Admitting: Urology

## 2024-02-11 VITALS — BP 153/76 | HR 66

## 2024-02-11 DIAGNOSIS — N401 Enlarged prostate with lower urinary tract symptoms: Secondary | ICD-10-CM

## 2024-02-11 DIAGNOSIS — R972 Elevated prostate specific antigen [PSA]: Secondary | ICD-10-CM | POA: Diagnosis not present

## 2024-02-11 DIAGNOSIS — R3129 Other microscopic hematuria: Secondary | ICD-10-CM | POA: Diagnosis not present

## 2024-02-11 LAB — URINALYSIS, ROUTINE W REFLEX MICROSCOPIC
Bilirubin, UA: NEGATIVE
Glucose, UA: NEGATIVE
Ketones, UA: NEGATIVE
Leukocytes,UA: NEGATIVE
Nitrite, UA: NEGATIVE
Protein,UA: NEGATIVE
Specific Gravity, UA: 1.015 (ref 1.005–1.030)
Urobilinogen, Ur: 0.2 mg/dL (ref 0.2–1.0)
pH, UA: 6 (ref 5.0–7.5)

## 2024-02-11 LAB — MICROSCOPIC EXAMINATION: Bacteria, UA: NONE SEEN

## 2024-02-11 NOTE — Progress Notes (Unsigned)
 02/11/2024 11:14 AM   Troy Acosta [birth year] [id number]  Referring provider: Dettinger, Lucio Sabin, MD [address] [place],  [place] [address]  No chief complaint on file.   HPI:  PSA elevation-PSA was 2 and 2022, 2.4 in 2023.  PSA rose to 3.9 in 2024.  His 12/21/2023 PSA was 9.1. No exam. No FH PCa. No biopsy. No h/o BPH.   Today, seen for the above. He voids with a slow stream. Urgency. Foot on the floor. IPSS 13. DRE today with ~ 30 g prostate. UA with 3-10 rbc. No gross hematuria. He smoked cigars ago but was around a lot chemicals in textiles.   Retired from [employer] in [place] - Apr 2025. Since 1997.     PMH: Past Medical History:  Diagnosis Date   Anxiety    Arthritis    "all my joints" (08/24/2017)   Diaphragmatic hernia without mention of obstruction or gangrene    Dysrhythmia    afib, ablation- 12/2015, no problems since, off blood thinner- 09/2016   Essential hypertension, benign    Headache    had MRI- was consulted with Dr. Tilda Fogo,, headache has resolved"probably related to caffeine"   Hyperplasia of prostate    Iron  deficiency anemia, unspecified    Malaise and fatigue    Obesity    Other and unspecified hyperlipidemia    Paroxysmal atrial fibrillation (HCC)    PONV (postoperative nausea and vomiting)    Unspecified hypothyroidism    Wears dentures     Surgical History: Past Surgical History:  Procedure Laterality Date   COLONOSCOPY WITH ESOPHAGOGASTRODUODENOSCOPY (EGD)     ELECTROPHYSIOLOGIC STUDY N/A 12/24/2015   Afib ablation by Dr Nunzio Belch   JOINT REPLACEMENT     KNEE ARTHROSCOPY Right ~ 2016   LUNG BIOPSY  1990s   "no problems identified"   MULTIPLE TOOTH EXTRACTIONS     TOTAL KNEE ARTHROPLASTY Right 03/02/2017   Procedure: RIGHT TOTAL KNEE ARTHROPLASTY;  Surgeon: Winston Hawking, MD;  Location: [facility] OR;  Service: Orthopedics;  Laterality: Right;   TOTAL KNEE ARTHROPLASTY Left 08/24/2017   TOTAL KNEE ARTHROPLASTY Left 08/24/2017   Procedure: LEFT TOTAL  KNEE ARTHROPLASTY;  Surgeon: Winston Hawking, MD;  Location: [facility];  Service: Orthopedics;  Laterality: Left;    Home Medications:  Allergies as of 02/11/2024   No Known Allergies      Medication List        Accurate as of Feb 11, 2024 11:14 AM. If you have any questions, ask your nurse or doctor.          acetaminophen  500 MG tablet Commonly known as: TYLENOL  Take 500 mg by mouth in the morning and at bedtime.   allopurinol  100 MG tablet Commonly known as: ZYLOPRIM  Take 2 tablets (200 mg total) by mouth daily.   amLODipine  5 MG tablet Commonly known as: NORVASC  Take 1 tablet (5 mg total) by mouth daily.   aspirin  EC 81 MG tablet Take 81 mg by mouth daily.   hydrochlorothiazide  25 MG tablet Commonly known as: HYDRODIURIL  Take 1 tablet (25 mg total) by mouth daily.   iron  polysaccharides 150 MG capsule Commonly known as: Ferrex 150 Take 1 capsule (150 mg total) by mouth daily.   levothyroxine  200 MCG tablet Commonly known as: SYNTHROID  Take 1 tablet (200 mcg total) by mouth daily before breakfast.   olmesartan  40 MG tablet Commonly known as: BENICAR  Take 0.5 tablets (20 mg total) by mouth 2 (two) times daily.   rosuvastatin  5  MG tablet Commonly known as: CRESTOR  Take 1 tablet (5 mg total) by mouth daily.   vitamin B-12 100 MCG tablet Commonly known as: CYANOCOBALAMIN  Take 100 mcg by mouth daily.   Vitamin D  (Cholecalciferol ) 25 MCG (1000 UT) Caps Take by mouth.        Allergies: No Known Allergies  Family History: Family History  Problem Relation Age of Onset   Heart failure Mother    Hypertension Mother    Diabetes Mother    Lung cancer Father    COPD Father    Tuberous sclerosis Father    Heart disease Sister    Hypertension Sister    Thyroid  disease Sister    Crohn's disease Sister    Cancer Brother    Lung cancer Paternal Grandfather     Social History:  reports that he quit smoking about 31 years ago. His smoking use included cigars.  He has never used smokeless tobacco. He reports that he does not drink alcohol and does not use drugs.   Physical Exam: BP (!) 153/76   Pulse 66   Constitutional:  Alert and oriented, No acute distress. HEENT: [place] AT, moist mucus membranes.  Trachea midline, no masses. Cardiovascular: No clubbing, cyanosis, or edema. Respiratory: Normal respiratory effort, no increased work of breathing. GI: Abdomen is soft, nontender, nondistended, no abdominal masses GU: No CVA tenderness Lymph: No cervical or inguinal lymphadenopathy. Skin: No rashes, bruises or suspicious lesions. Neurologic: Grossly intact, no focal deficits, moving all 4 extremities. Psychiatric: Normal mood and affect. DRE: prostate ~ 30 g, smooth without hard area or nodule.   Laboratory Data: Lab Results  Component Value Date   WBC 8.1 12/21/2023   HGB 15.5 12/21/2023   HCT 46.1 12/21/2023   MCV 88 12/21/2023   PLT 218 12/21/2023    Lab Results  Component Value Date   CREATININE 1.01 12/21/2023    Lab Results  Component Value Date   PSA 0.8 11/15/2015    Lab Results  Component Value Date   TESTOSTERONE  351 03/13/2016    Lab Results  Component Value Date   HGBA1C 5.5 06/26/2023    Urinalysis    Component Value Date/Time   APPEARANCEUR Clear 12/15/2020 1407   GLUCOSEU Negative 12/15/2020 1407   BILIRUBINUR Negative 12/15/2020 1407   PROTEINUR Negative 12/15/2020 1407   UROBILINOGEN negative 09/22/2014 1631   NITRITE Negative 12/15/2020 1407   LEUKOCYTESUR Negative 12/15/2020 1407    Lab Results  Component Value Date   LABMICR See below: 12/15/2020   WBCUA None seen 12/15/2020   RBCUA None seen 10/07/2018   LABEPIT None seen 12/15/2020   MUCUS neg 09/22/2014   BACTERIA Few 12/15/2020    Pertinent Imaging: N/a    Assessment & Plan:     PSA - I had a long discussion with the patient on the nature of elevated PSA - benign vs malignant causes. Reviewed anatomy. We discussed age specific  levels and that PCa can be seen on a biopsy with very low PSA levels (<=2.5). We discussed the nature risks and benefits of continued surveillance, other lab tests, imaging as well as prostate biopsy. We discussed the management of prostate cancer might include active surveillance or treatment depending on biopsy findings. All questions answered. repeat PSA. If remains elevated - proceed with prostate biopsy as PSAD may be high.   MH - check CT and cystoscopy which will also eval LUTS and PSA.    No follow-ups on file.  Christina Coyer, MD  [facility]  [address] [place], [place] [address] [phone]

## 2024-02-12 ENCOUNTER — Telehealth: Payer: Self-pay | Admitting: Urology

## 2024-02-12 LAB — PSA: Prostate Specific Ag, Serum: 8.7 ng/mL — ABNORMAL HIGH (ref 0.0–4.0)

## 2024-02-12 NOTE — Telephone Encounter (Signed)
 Received a message on Mychart about blood work results and they do not use mychart. Would like a call about results and leave a message if they do not answer.

## 2024-02-12 NOTE — Telephone Encounter (Signed)
 Wife notified that b/c pt has an active mychart the lab work will be sent there autokinetically I notified wife that once the MD results the lab work we will reach out via phone call with results she voiced her understanding

## 2024-02-14 ENCOUNTER — Telehealth: Payer: Self-pay | Admitting: Urology

## 2024-02-14 NOTE — Telephone Encounter (Signed)
 Left detailed message per MD Eskridge see previous encounters for details

## 2024-02-14 NOTE — Telephone Encounter (Signed)
 Patients wife called very upset that no one has called with the lab results that he had done. I explained that Dr Derrick Fling is not here everyday, she wants a call back asap with results.

## 2024-02-14 NOTE — Telephone Encounter (Signed)
 FYI and advise please

## 2024-02-18 ENCOUNTER — Telehealth: Payer: Self-pay

## 2024-02-18 NOTE — Telephone Encounter (Signed)
 Called pt back and spoke w/ wife to let them know MD Eskridge response to previous questions pt voiced understanding and scheduled biopsy for 06/02 and will mail biopsy instructions to verified address

## 2024-02-18 NOTE — Telephone Encounter (Signed)
 Pt called with questions regarding his CT and Biopsy. Pt wanted to know did he need to get the CT done before the biopsy or the other way around ? Was confused due to having a CT scheduled then results given stating per MD he needed a prostate biopsy advised pt that id reach out to MD and c/b with f/u per MD

## 2024-02-26 ENCOUNTER — Telehealth: Payer: Self-pay | Admitting: Urology

## 2024-02-26 DIAGNOSIS — N138 Other obstructive and reflux uropathy: Secondary | ICD-10-CM

## 2024-02-26 MED ORDER — LEVOFLOXACIN 750 MG PO TABS: 750.0000 mg | ORAL_TABLET | Freq: Once | ORAL | 0 refills | Status: AC

## 2024-02-26 NOTE — Telephone Encounter (Signed)
 Called wife to let her know pt Rx for bx has been sent to [facility] pharmacy on file

## 2024-02-26 NOTE — Telephone Encounter (Signed)
 Needs antibiotic for biopsy [facility] to [facility]

## 2024-03-10 ENCOUNTER — Ambulatory Visit (HOSPITAL_COMMUNITY)
Admission: RE | Admit: 2024-03-10 | Discharge: 2024-03-10 | Disposition: A | Discharge status: Home / Self Care | Source: Ambulatory Visit | Attending: Urology | Admitting: Urology

## 2024-03-10 ENCOUNTER — Other Ambulatory Visit: Payer: Self-pay | Admitting: Urology

## 2024-03-10 ENCOUNTER — Other Ambulatory Visit (HOSPITAL_COMMUNITY): Payer: Self-pay | Admitting: Urology

## 2024-03-10 ENCOUNTER — Ambulatory Visit: Admitting: Urology

## 2024-03-10 ENCOUNTER — Encounter (HOSPITAL_COMMUNITY): Payer: Self-pay

## 2024-03-10 ENCOUNTER — Encounter: Payer: Self-pay | Admitting: Urology

## 2024-03-10 DIAGNOSIS — R972 Elevated prostate specific antigen [PSA]: Secondary | ICD-10-CM

## 2024-03-10 DIAGNOSIS — C61 Malignant neoplasm of prostate: Secondary | ICD-10-CM

## 2024-03-10 MED ORDER — GENTAMICIN SULFATE 40 MG/ML IJ SOLN
160.0000 mg | Freq: Once | INTRAMUSCULAR | Status: AC
  Administered 2024-03-10: 160 mg via INTRAMUSCULAR

## 2024-03-10 MED ORDER — LIDOCAINE HCL (PF) 2 % IJ SOLN
INTRAMUSCULAR | Status: AC
  Filled 2024-03-10: qty 10

## 2024-03-10 MED ORDER — GENTAMICIN SULFATE 40 MG/ML IJ SOLN
INTRAMUSCULAR | Status: AC
  Filled 2024-03-10: qty 4

## 2024-03-10 MED ORDER — LIDOCAINE HCL (PF) 2 % IJ SOLN
10.0000 mL | Freq: Once | INTRAMUSCULAR | Status: AC
  Administered 2024-03-10: 10 mL

## 2024-03-10 NOTE — Progress Notes (Signed)
PT tolerated prostate biopsy procedure well today. Labs obtained and sent for pathology by Richard from ultrasound. PT ambulatory at discharge with no acute distress noted and verbalized understanding of discharge instructions.

## 2024-03-10 NOTE — Progress Notes (Signed)
 HPI:  F/u -   1) PSA elevation-PSA was 2 and 2022, 2.4 in 2023.  PSA rose to 3.9 in 2024.  His 12/21/2023 PSA was 9.1. No exam. No FH PCa. No biopsy. No h/o BPH.    2) BPH, LUTS - He voids with a slow stream. Urgency. Foot on the floor. IPSS 13. DRE today with ~ 30 g prostate.   3) MH - on UA with 3-10 rbc May 2025. No gross hematuria. He smoked cigars ago but was around a lot chemicals in textiles.   Today, seen for prostate biopsy.His PSA remain elevated May 2025 PSA 8.7. No fever, dysuria or gross hematuria today.    Retired from [employer] in [place] - Apr 2025. Since 1997.   Prostate Biopsy Procedure   Informed consent was obtained after discussing risks/benefits of the procedure.  A time out was performed to ensure correct patient identity.  Pre-Procedure: - Last PSA Level:  Lab Results  Component Value Date   PSA 0.8 11/15/2015   - Gentamicin given prophylactically - Levaquin  500 mg administered PO -DRE: some right induration -Transrectal Ultrasound performed revealing a 30.4 gm prostate -No significant hypoechoic or median lobe noted  Procedure: - Prostate block performed using 10 cc 1% lidocaine  and biopsies taken from sextant areas, a total of 12 under ultrasound guidance.  A/P: PSA elevation, BPH/LUTS, and MH -   Post-Procedure: - Patient tolerated the procedure well - He was counseled to seek immediate medical attention if experiences any severe pain, significant bleeding, or fevers - Return to discuss biopsy results and for CT and cystoscopy as planned.

## 2024-03-13 LAB — SURGICAL PATHOLOGY

## 2024-03-19 ENCOUNTER — Telehealth: Payer: Self-pay | Admitting: Urology

## 2024-03-19 NOTE — Telephone Encounter (Signed)
 I called and spoke with Troy Acosta is doing well after the prostate biopsy.  We went over his stage grade and prognosis.  Discussed treatment with surgery or radiation.  He has a CT scan of the abdomen and pelvis pending as well as cystoscopy.  He will consider his options and follow-up for those procedures.  Biopsy: June 2025 unfavorable intermediate risk prostate cancer PSA 9 T2b (right induration) Prostate 30 g GG 3, 4 cores, 20 to 90% GG 2, 4 cores, 20 to 90% 8/12 bilateral MSK LN, SV about 25%

## 2024-03-25 ENCOUNTER — Ambulatory Visit (HOSPITAL_COMMUNITY)
Admission: RE | Admit: 2024-03-25 | Discharge: 2024-03-25 | Disposition: A | Discharge status: Home / Self Care | Source: Ambulatory Visit | Attending: Urology | Admitting: Urology

## 2024-03-25 ENCOUNTER — Encounter (HOSPITAL_COMMUNITY): Payer: Self-pay | Admitting: Radiology

## 2024-03-25 DIAGNOSIS — K8020 Calculus of gallbladder without cholecystitis without obstruction: Secondary | ICD-10-CM | POA: Diagnosis not present

## 2024-03-25 DIAGNOSIS — R3129 Other microscopic hematuria: Secondary | ICD-10-CM | POA: Insufficient documentation

## 2024-03-25 LAB — POCT I-STAT CREATININE: Creatinine, Ser: 1 mg/dL (ref 0.61–1.24)

## 2024-03-25 MED ORDER — IOHEXOL 300 MG/ML  SOLN
125.0000 mL | Freq: Once | INTRAMUSCULAR | Status: AC | PRN
  Administered 2024-03-25: 125 mL via INTRAVENOUS

## 2024-03-31 ENCOUNTER — Ambulatory Visit: Payer: Self-pay

## 2024-04-07 ENCOUNTER — Ambulatory Visit: Admitting: Urology

## 2024-04-07 VITALS — BP 136/80 | HR 65

## 2024-04-07 DIAGNOSIS — N138 Other obstructive and reflux uropathy: Secondary | ICD-10-CM | POA: Diagnosis not present

## 2024-04-07 DIAGNOSIS — C61 Malignant neoplasm of prostate: Secondary | ICD-10-CM | POA: Diagnosis not present

## 2024-04-07 DIAGNOSIS — N401 Enlarged prostate with lower urinary tract symptoms: Secondary | ICD-10-CM | POA: Diagnosis not present

## 2024-04-07 LAB — URINALYSIS, ROUTINE W REFLEX MICROSCOPIC
Bilirubin, UA: NEGATIVE
Glucose, UA: NEGATIVE
Ketones, UA: NEGATIVE
Leukocytes,UA: NEGATIVE
Nitrite, UA: NEGATIVE
Protein,UA: NEGATIVE
Specific Gravity, UA: 1.01 (ref 1.005–1.030)
Urobilinogen, Ur: 0.2 mg/dL (ref 0.2–1.0)
pH, UA: 6 (ref 5.0–7.5)

## 2024-04-07 LAB — MICROSCOPIC EXAMINATION
Bacteria, UA: NONE SEEN
WBC, UA: NONE SEEN /HPF (ref 0–5)

## 2024-04-07 MED ORDER — CIPROFLOXACIN HCL 500 MG PO TABS
500.0000 mg | ORAL_TABLET | Freq: Once | ORAL | Status: AC
Start: 2024-04-07 — End: 2024-04-07
  Administered 2024-04-07: 500 mg via ORAL

## 2024-04-07 NOTE — Progress Notes (Signed)
 04/07/2024 10:44 AM   Troy Acosta 1951/05/31 [id number]  Referring provider: Dettinger, Fonda LABOR, MD [address] [place],  [place] [address]  No chief complaint on file.   HPI: Follow-up   1) Prostate cancer - diagnosised with unfavorable intermediate risk prostate cancer June 2025. H/o PSA elevation. PSA was 2 and 2022, 2.4 in 2023.  PSA rose to 3.9 in 2024.  His 12/21/2023 PSA was 9.1. No exam. No FH PCa. No biopsy. No h/o BPH.   Biopsy: June 2025 unfavorable intermediate risk prostate cancer PSA 9 T2b (right induration) Prostate 30 g GG 3, 4 cores, 20 to 90% GG 2, 4 cores, 20 to 90% 8/12 bilateral MSK LN, SV about 25%   2) BPH, LUTS - He voids with a slow stream. Urgency. Foot on the floor. IPSS 13. DRE/imaging with ~ 30 g prostate.    3) MH - on UA with 3-10 rbc May 2025. No gross hematuria. He smoked cigars ago but was around a lot chemicals in textiles.    Today, seen for prostate biopsy. His PSA remain elevated May 2025 PSA 8.7. No fever, dysuria or gross hematuria today.    Retired from [employer] in [place] - Apr 2025. Since 1997.   Today, seen for two reasons: 1) cysto to complete MH eval, 2) management of new diagnosis - Prostate cancer. His June 2025 CT-no lymphadenopathy or bone lesions reported. Cystoscopy benign today with borderline obs prostate and normal bladder neck.   PMH: Past Medical History:  Diagnosis Date   Anxiety    Arthritis    all my joints (08/24/2017)   Diaphragmatic hernia without mention of obstruction or gangrene    Dysrhythmia    afib, ablation- 12/2015, no problems since, off blood thinner- 09/2016   Essential hypertension, benign    Headache    had MRI- was consulted with Dr. Jenel,, headache has resolvedprobably related to caffeine   Hyperplasia of prostate    Iron  deficiency anemia, unspecified    Malaise and fatigue    Obesity    Other and unspecified hyperlipidemia    Paroxysmal atrial fibrillation (HCC)    PONV  (postoperative nausea and vomiting)    Unspecified hypothyroidism    Wears dentures     Surgical History: Past Surgical History:  Procedure Laterality Date   COLONOSCOPY WITH ESOPHAGOGASTRODUODENOSCOPY (EGD)     ELECTROPHYSIOLOGIC STUDY N/A 12/24/2015   Afib ablation by Dr Kelsie   JOINT REPLACEMENT     KNEE ARTHROSCOPY Right ~ 2016   LUNG BIOPSY  1990s   no problems identified   MULTIPLE TOOTH EXTRACTIONS     TOTAL KNEE ARTHROPLASTY Right 03/02/2017   Procedure: RIGHT TOTAL KNEE ARTHROPLASTY;  Surgeon: Kay Kemps, MD;  Location[facility] OR;  Service: Orthopedics;  Laterality: Right;   TOTAL KNEE ARTHROPLASTY Left 08/24/2017   TOTAL KNEE ARTHROPLASTY Left 08/24/2017   Procedure: LEFT TOTAL KNEE ARTHROPLASTY;  Surgeon: Kay Kemps, MD;  Location: [facility] OR;  Service: Orthopedics;  Laterality: Left;    Home Medications:  Allergies as of 04/07/2024   No Known Allergies      Medication List        Accurate as of April 07, 2024 10:44 AM. If you have any questions, ask your nurse or doctor.          acetaminophen  500 MG tablet Commonly known as: TYLENOL  Take 500 mg by mouth in the morning and at bedtime.   allopurinol  100 MG tablet Commonly known as: ZYLOPRIM  Take 2  tablets (200 mg total) by mouth daily.   amLODipine  5 MG tablet Commonly known as: NORVASC  Take 1 tablet (5 mg total) by mouth daily.   aspirin  EC 81 MG tablet Take 81 mg by mouth daily.   hydrochlorothiazide  25 MG tablet Commonly known as: HYDRODIURIL  Take 1 tablet (25 mg total) by mouth daily.   iron  polysaccharides 150 MG capsule Commonly known as: Ferrex 150 Take 1 capsule (150 mg total) by mouth daily.   levofloxacin  750 MG tablet Commonly known as: LEVAQUIN  Take 750 mg by mouth once.   levothyroxine  200 MCG tablet Commonly known as: SYNTHROID  Take 1 tablet (200 mcg total) by mouth daily before breakfast.   olmesartan  40 MG tablet Commonly known as: BENICAR  Take 0.5 tablets (20 mg total) by  mouth 2 (two) times daily.   rosuvastatin  5 MG tablet Commonly known as: CRESTOR  Take 1 tablet (5 mg total) by mouth daily.   vitamin B-12 100 MCG tablet Commonly known as: CYANOCOBALAMIN  Take 100 mcg by mouth daily.   Vitamin D  (Cholecalciferol ) 25 MCG (1000 UT) Caps Take by mouth.        Allergies: No Known Allergies  Family History: Family History  Problem Relation Age of Onset   Heart failure Mother    Hypertension Mother    Diabetes Mother    Lung cancer Father    COPD Father    Tuberous sclerosis Father    Heart disease Sister    Hypertension Sister    Thyroid  disease Sister    Crohn's disease Sister    Cancer Brother    Lung cancer Paternal Grandfather     Social History:  reports that he quit smoking about 31 years ago. His smoking use included cigars. He has never used smokeless tobacco. He reports that he does not drink alcohol and does not use drugs.   Physical Exam: BP 136/80   Pulse 65   NED. A&Ox3.   No respiratory distress   Abd soft, NT, ND Normal phallus with bilateral descended testicles, meatus and glans appear normal  Cystoscopy Procedure Note  Patient identification was confirmed, informed consent was obtained, and patient was prepped using Betadine solution.  Lidocaine  jelly was administered per urethral meatus.     Pre-Procedure: - Inspection reveals a normal caliber ureteral meatus.  Procedure: The flexible cystoscope was introduced without difficulty - No urethral strictures/lesions are present. - short borderline obs prostate  - normal bladder neck - Bilateral ureteral orifices identified - Bladder mucosa  reveals no ulcers, tumors, or lesions - No bladder stones - No trabeculation  Retroflexion shows normal bladder, bladder neck    Post-Procedure: - Patient tolerated the procedure well    Laboratory Data: Lab Results  Component Value Date   WBC 8.1 12/21/2023   HGB 15.5 12/21/2023   HCT 46.1 12/21/2023   MCV 88  12/21/2023   PLT 218 12/21/2023    Lab Results  Component Value Date   CREATININE 1.00 03/25/2024    Lab Results  Component Value Date   PSA 0.8 11/15/2015    Lab Results  Component Value Date   TESTOSTERONE  351 03/13/2016    Lab Results  Component Value Date   HGBA1C 5.5 06/26/2023    Urinalysis    Component Value Date/Time   APPEARANCEUR Clear 02/11/2024 1143   GLUCOSEU Negative 02/11/2024 1143   BILIRUBINUR Negative 02/11/2024 1143   PROTEINUR Negative 02/11/2024 1143   UROBILINOGEN negative 09/22/2014 1631   NITRITE Negative 02/11/2024 1143   LEUKOCYTESUR  Negative 02/11/2024 1143    Lab Results  Component Value Date   LABMICR See below: 02/11/2024   WBCUA 0-5 02/11/2024   RBCUA None seen 10/07/2018   LABEPIT 0-10 02/11/2024   MUCUS neg 09/22/2014   BACTERIA None seen 02/11/2024    Pertinent Imaging: CT June 2025    Assessment & Plan:    1. BPH with obstruction/lower urinary tract symptoms (Primary) Cysto benign today. Short borderline obs prostate.  - Urinalysis, Routine w reflex microscopic - ciprofloxacin (CIPRO) tablet 500 mg - Cystoscopy  2. MH - benign eval.   3. PCa - I had a long discussion with the patient and his wife using his path report and the anatomical video board. We went his stage, grade and prognosis and the relevant anatomy. We discussed the nature risks and benefits of active surveillance, radical prostatectomy, IMRT (+/- brachytherapy, +/- ADT). We discussed specifically how each treatment might affect the bowel, bladder and sexual function. We discussed how each treatment might effect salvage treatments. We discussed the role of other modalities in the treatment of prostate cancer including chemotherapy, HIFU and cryotherapy. Discussed the nature r/b/a to gold seeds, space oar and ADT with Eligard and the role they play in external beam treatment. Referred to Dr. Patrcia.    No follow-ups on file.  Donnice Brooks, MD  [facility]  [address] [place], [place] [address] [phone]

## 2024-04-16 ENCOUNTER — Telehealth: Payer: Self-pay | Admitting: Urology

## 2024-04-16 NOTE — Addendum Note (Signed)
 Addended by: ANN VELERIA SAUNDERS on: 04/16/2024 01:45 PM   Modules accepted: Orders

## 2024-04-16 NOTE — Telephone Encounter (Signed)
 Cy, I see the referral in his last visit.  Do you have this in your workque?

## 2024-04-16 NOTE — Telephone Encounter (Signed)
 Dr. Nieves I don't see that a referral was placed on 06/30, can you confirm this?

## 2024-04-16 NOTE — Telephone Encounter (Signed)
 Patient's wife called upset that they have been trying to get into contact with Dr Patrcia office to schedule appointment.  She said a referral was suppose to be placed on June 30th for him to go to the Cancer Center?   No referral in the system for Cancer Center please advise.

## 2024-04-16 NOTE — Telephone Encounter (Signed)
 Referral updated and confirmed with referral coordinator.

## 2024-04-25 NOTE — Progress Notes (Addendum)
 GU Location of Tumor / Histology: Prostate Ca  If Prostate Cancer, Gleason Score is (4 + 3) and PSA is (8.7 on 02/11/2024)  Troy Acosta presented as referral from Dr. Donnice Brooks [facility]) elevated PSA.  Biopsies     03/25/2024 Dr. Donnice Brooks CT Abdomen Pelvis with/without Contrast INDICATION: Hematuria, microscopic, increased risk for urinary tract malignancy.  IMPRESSION: 1. No evidence of urinary tract mass, calculus or obstruction. 2. Benign nonacute findings as described above.    Past/Anticipated interventions by urology, if any:  Dr. Donnice Brooks   Past/Anticipated interventions by medical oncology, if any: NA  Weight changes, if any:  No  IPSS:  10 SHIM:  11  Bowel/Bladder complaints, if any:  Reports urinary frequent & nocturia x 2.  Nausea/Vomiting, if any: No  Pain issues, if any:  0/10  SAFETY ISSUES: Prior radiation? No Pacemaker/ICD? No Possible current pregnancy? Male Is the patient on methotrexate? No  Current Complaints / other details:  None  Spent 30 minutes with patient.

## 2024-04-26 ENCOUNTER — Encounter: Payer: Self-pay | Admitting: Urology

## 2024-04-26 DIAGNOSIS — C61 Malignant neoplasm of prostate: Secondary | ICD-10-CM | POA: Insufficient documentation

## 2024-04-26 NOTE — Progress Notes (Signed)
 Radiation Oncology         [phone] ________________________________  Initial Outpatient Consultation  Name: Troy Acosta MRN: [id number]  Date: 04/29/2024  DOB: 16-May-1951  RR:Izuupwhzm, Fonda LABOR, MD  Nieves Cough, MD   REFERRING PHYSICIAN: Nieves Cough, MD  DIAGNOSIS: 73 y.o. gentleman with Stage T1c adenocarcinoma of the prostate with Gleason score of 4+3, and PSA of 8.7.    ICD-10-CM   1. Malignant neoplasm of prostate (HCC)  C61       HISTORY OF PRESENT ILLNESS: Troy Acosta is a 73 y.o. male with a diagnosis of prostate cancer. He has had a risin PSA for the past 3 years, at 2.0 in 2022, 2.4 in 2023, and 3.9 in 2024. This year, he was noted to have an elevated PSA of 9.1 on routine labs 12/21/23, by his primary care physician, Dr. Maryanne.  Accordingly, he was referred for evaluation in urology by Dr. Nieves on 02/11/24,  digital rectal examination performed at that time showed no concerning findings but he did have some microscopic hematuria noted on urinalysis.  The patient proceeded to transrectal ultrasound with 12 biopsies of the prostate on 03/10/24.  The prostate volume measured 30.4 cc.  Out of 12 core biopsies, 8 were positive.  The maximum Gleason score was 4+3, and this was seen in the left base, left mid, left apex and right mid lateral. Additionally, Gleason 3+4 was seen in the right mid, right apex, right apex lateral and right base lateral.  A CT A/p was performed on 03/25/24 as part of his workup for microhematuria and this did not show any lymphadenopathy or osseous metastases.  In office cystoscopy on 04/07/24 did note a borderline obstructing prostate but no bladder lesions.  The patient reviewed the biopsy results with his urologist and he has kindly been referred today for discussion of potential radiation treatment options.   PREVIOUS RADIATION THERAPY: No  PAST MEDICAL HISTORY:  Past Medical History:  Diagnosis Date   Anxiety    Arthritis    all my  joints (08/24/2017)   Diaphragmatic hernia without mention of obstruction or gangrene    Dysrhythmia    afib, ablation- 12/2015, no problems since, off blood thinner- 09/2016   Essential hypertension, benign    Headache    had MRI- was consulted with Dr. Jenel,, headache has resolvedprobably related to caffeine   Hyperplasia of prostate    Iron  deficiency anemia, unspecified    Malaise and fatigue    Obesity    Other and unspecified hyperlipidemia    Paroxysmal atrial fibrillation (HCC)    PONV (postoperative nausea and vomiting)    Unspecified hypothyroidism    Wears dentures       PAST SURGICAL HISTORY: Past Surgical History:  Procedure Laterality Date   COLONOSCOPY WITH ESOPHAGOGASTRODUODENOSCOPY (EGD)     ELECTROPHYSIOLOGIC STUDY N/A 12/24/2015   Afib ablation by Dr Kelsie   JOINT REPLACEMENT     KNEE ARTHROSCOPY Right ~ 2016   LUNG BIOPSY  1990s   no problems identified   MULTIPLE TOOTH EXTRACTIONS     TOTAL KNEE ARTHROPLASTY Right 03/02/2017   Procedure: RIGHT TOTAL KNEE ARTHROPLASTY;  Surgeon: Kay Kemps, MD;  Location: [facility];  Service: Orthopedics;  Laterality: Right;   TOTAL KNEE ARTHROPLASTY Left 08/24/2017   TOTAL KNEE ARTHROPLASTY Left 08/24/2017   Procedure: LEFT TOTAL KNEE ARTHROPLASTY;  Surgeon: Kay Kemps, MD;  Location: [facility] OR;  Service: Orthopedics;  Laterality: Left;    FAMILY HISTORY:  Family History  Problem Relation Age of Onset   Heart failure Mother    Hypertension Mother    Diabetes Mother    Lung cancer Father    COPD Father    Tuberous sclerosis Father    Heart disease Sister    Hypertension Sister    Thyroid  disease Sister    Crohn's disease Sister    Cancer Brother    Lung cancer Paternal Grandfather     SOCIAL HISTORY:  Social History   Socioeconomic History   Marital status: Married    Spouse name: Not on file   Number of children: 0   Years of education: 12   Highest education level: Not on file  Occupational  History   Occupation: Architect: [employer]  Tobacco Use   Smoking status: Former    Types: Cigars    Quit date: 12/06/1992    Years since quitting: 31.4   Smokeless tobacco: Never  Vaping Use   Vaping status: Never Used  Substance and Sexual Activity   Alcohol use: No   Drug use: No   Sexual activity: Not Currently    Partners: Female  Other Topics Concern   Not on file  Social History Narrative   Lives at home w/ his wife   Left-handed   Caffeine: 1 cup coffee per day   Social Drivers of Corporate investment banker Strain: Not on file  Food Insecurity: Not on file  Transportation Needs: Not on file  Physical Activity: Not on file  Stress: Not on file  Social Connections: Not on file  Intimate Partner Violence: Not on file    ALLERGIES: Patient has no known allergies.  MEDICATIONS:  Current Outpatient Medications  Medication Sig Dispense Refill   acetaminophen  (TYLENOL ) 500 MG tablet Take 500 mg by mouth in the morning and at bedtime.     allopurinol  (ZYLOPRIM ) 100 MG tablet Take 2 tablets (200 mg total) by mouth daily. 200 tablet 3   amLODipine  (NORVASC ) 5 MG tablet Take 1 tablet (5 mg total) by mouth daily. 100 tablet 3   aspirin  EC 81 MG tablet Take 81 mg by mouth daily.     hydrochlorothiazide  (HYDRODIURIL ) 25 MG tablet Take 1 tablet (25 mg total) by mouth daily. 100 tablet 3   iron  polysaccharides (FERREX 150) 150 MG capsule Take 1 capsule (150 mg total) by mouth daily. 90 capsule 3   levothyroxine  (SYNTHROID ) 200 MCG tablet Take 1 tablet (200 mcg total) by mouth daily before breakfast. 100 tablet 3   olmesartan  (BENICAR ) 40 MG tablet Take 0.5 tablets (20 mg total) by mouth 2 (two) times daily. 100 tablet 3   rosuvastatin  (CRESTOR ) 5 MG tablet Take 1 tablet (5 mg total) by mouth daily. 100 tablet 3   vitamin B-12 (CYANOCOBALAMIN ) 100 MCG tablet Take 100 mcg by mouth daily.     Vitamin D , Cholecalciferol , 25 MCG (1000 UT) CAPS Take by mouth.      No current facility-administered medications for this visit.   Facility-Administered Medications Ordered in Other Visits  Medication Dose Route Frequency Provider Last Rate Last Admin   gadopentetate dimeglumine  (MAGNEVIST ) injection 20 mL  20 mL Intravenous Once PRN Jenel Carlin POUR, MD        REVIEW OF SYSTEMS:  On review of systems, the patient reports that he is doing well overall. He denies any chest pain, shortness of breath, cough, fevers, chills, night sweats, unintended weight changes. He denies any bowel disturbances, and denies abdominal  pain, nausea or vomiting. He denies any new musculoskeletal or joint aches or pains. His IPSS was ***, indicating *** urinary symptoms. His SHIM was ***, indicating he {does not have/has mild/moderate/severe} erectile dysfunction. A complete review of systems is obtained and is otherwise negative.    PHYSICAL EXAM:  Wt Readings from Last 3 Encounters:  12/21/23 252 lb (114.3 kg)  11/23/23 257 lb 6.4 oz (116.8 kg)  06/21/23 249 lb (112.9 kg)   Temp Readings from Last 3 Encounters:  03/10/24 98 F (36.7 C) (Oral)  07/30/22 98.3 F (36.8 C) (Oral)  03/13/22 98.7 F (37.1 C)   BP Readings from Last 3 Encounters:  04/07/24 136/80  03/10/24 (!) 141/90  02/11/24 (!) 153/76   Pulse Readings from Last 3 Encounters:  04/07/24 65  03/10/24 64  02/11/24 66    /10  In general this is a well appearing *** male in no acute distress. He's alert and oriented x4 and appropriate throughout the examination. Cardiopulmonary assessment is negative for acute distress, and he exhibits normal effort.     KPS = ***  100 - Normal; no complaints; no evidence of disease. 90   - Able to carry on normal activity; minor signs or symptoms of disease. 80   - Normal activity with effort; some signs or symptoms of disease. 24   - Cares for self; unable to carry on normal activity or to do active work. 60   - Requires occasional assistance, but is able to  care for most of his personal needs. 50   - Requires considerable assistance and frequent medical care. 40   - Disabled; requires special care and assistance. 30   - Severely disabled; hospital admission is indicated although death not imminent. 20   - Very sick; hospital admission necessary; active supportive treatment necessary. 10   - Moribund; fatal processes progressing rapidly. 0     - Dead  Karnofsky DA, Abelmann WH, Craver LS and Burchenal [facility] [phone]) The use of the nitrogen mustards in the palliative treatment of carcinoma: with particular reference to bronchogenic carcinoma Cancer 1 634-56  LABORATORY DATA:  Lab Results  Component Value Date   WBC 8.1 12/21/2023   HGB 15.5 12/21/2023   HCT 46.1 12/21/2023   MCV 88 12/21/2023   PLT 218 12/21/2023   Lab Results  Component Value Date   NA 139 12/21/2023   K 4.7 12/21/2023   CL 99 12/21/2023   CO2 26 12/21/2023   Lab Results  Component Value Date   ALT 20 12/21/2023   AST 18 12/21/2023   ALKPHOS 72 12/21/2023   BILITOT 0.5 12/21/2023     RADIOGRAPHY: No results found.    IMPRESSION/PLAN: 1. 73 y.o. gentleman with Stage T1c adenocarcinoma of the prostate with Gleason Score of 4+3, and PSA of 8.7. We discussed the patient's workup and outlined the nature of prostate cancer in this setting. The patient's T stage, Gleason's score, and PSA put him into the unfavorable intermediate risk group. Accordingly, he is eligible for a variety of potential treatment options including  prostatectomy, brachytherapy or 5.5 weeks of external radiation concurrent with ST-ADT. We discussed the available radiation techniques, and focused on the details and logistics of delivery. We discussed and outlined the risks, benefits, short and long-term effects associated with radiotherapy and compared and contrasted these with prostatectomy. We discussed the role of SpaceOAR gel in reducing the rectal toxicity associated with radiotherapy. We also  detailed the role of ADT in the  treatment of unfavorable intermediate risk prostate cancer and outlined the associated side effects that could be expected with this therapy. ADT would be recommended with external beam radiation but given the dose of radiation that can be acheived with brachytherapy, this is not felt to be necessary should he chose brachytherapy for treatment  He appears to have a good understanding of his disease and our treatment recommendations which are of curative intent.  He was encouraged to ask questions that were answered to his stated satisfaction.  At the conclusion of our conversation, the patient is interested in moving forward with ***.  I personally spent *** minutes in this encounter including chart review, reviewing radiological studies, meeting face-to-face with the patient, entering orders and completing documentation.   ------------------------------------------------   Donnice Barge, MD [facility]  Radiation Oncology Direct Dial: [phone]  Fax: [phone] [email or web address]  Skype  LinkedIn

## 2024-04-29 ENCOUNTER — Encounter: Payer: Self-pay | Admitting: Radiation Oncology

## 2024-04-29 ENCOUNTER — Ambulatory Visit
Admission: RE | Admit: 2024-04-29 | Discharge: 2024-04-29 | Disposition: A | Discharge status: Home / Self Care | Source: Ambulatory Visit | Attending: Radiation Oncology | Admitting: Radiation Oncology

## 2024-04-29 VITALS — BP 159/96 | HR 65 | Temp 97.9°F | Resp 20 | Ht 71.0 in | Wt 253.0 lb

## 2024-04-29 DIAGNOSIS — Z79899 Other long term (current) drug therapy: Secondary | ICD-10-CM | POA: Diagnosis not present

## 2024-04-29 DIAGNOSIS — Z87891 Personal history of nicotine dependence: Secondary | ICD-10-CM | POA: Diagnosis not present

## 2024-04-29 DIAGNOSIS — Z79890 Hormone replacement therapy (postmenopausal): Secondary | ICD-10-CM | POA: Insufficient documentation

## 2024-04-29 DIAGNOSIS — I480 Paroxysmal atrial fibrillation: Secondary | ICD-10-CM | POA: Diagnosis not present

## 2024-04-29 DIAGNOSIS — E785 Hyperlipidemia, unspecified: Secondary | ICD-10-CM | POA: Diagnosis not present

## 2024-04-29 DIAGNOSIS — K449 Diaphragmatic hernia without obstruction or gangrene: Secondary | ICD-10-CM | POA: Insufficient documentation

## 2024-04-29 DIAGNOSIS — Z7982 Long term (current) use of aspirin: Secondary | ICD-10-CM | POA: Diagnosis not present

## 2024-04-29 DIAGNOSIS — Z801 Family history of malignant neoplasm of trachea, bronchus and lung: Secondary | ICD-10-CM | POA: Insufficient documentation

## 2024-04-29 DIAGNOSIS — C61 Malignant neoplasm of prostate: Secondary | ICD-10-CM

## 2024-04-29 DIAGNOSIS — E039 Hypothyroidism, unspecified: Secondary | ICD-10-CM | POA: Diagnosis not present

## 2024-04-29 DIAGNOSIS — I10 Essential (primary) hypertension: Secondary | ICD-10-CM | POA: Insufficient documentation

## 2024-04-29 DIAGNOSIS — R3129 Other microscopic hematuria: Secondary | ICD-10-CM | POA: Diagnosis not present

## 2024-04-29 DIAGNOSIS — D509 Iron deficiency anemia, unspecified: Secondary | ICD-10-CM | POA: Insufficient documentation

## 2024-04-29 DIAGNOSIS — Z191 Hormone sensitive malignancy status: Secondary | ICD-10-CM | POA: Diagnosis not present

## 2024-04-29 HISTORY — DX: Elevated prostate specific antigen (PSA): R97.20

## 2024-04-29 NOTE — Progress Notes (Signed)
 Introduced myself to the patient, and his wife, as the prostate nurse navigator.  No barriers to care identified at this time.  He is here to discuss his radiation treatment options.  I gave him my business card and asked him to call me with questions or concerns.  Verbalized understanding.

## 2024-05-01 NOTE — Progress Notes (Signed)
 RN spoke with patient and patient's wife.  Treatment recommendations reviewed in detailed.  All questions answered.  Patient will have PSMA PET on 05/12/2024.  Patient will take some additional time to review options and proceed with imaging. Will continue to follow.

## 2024-05-01 NOTE — Progress Notes (Signed)
 PSMA PET authorization pending at this time.   RN will continue to follow and have PSMA PET scheduled once authorized.

## 2024-05-05 ENCOUNTER — Telehealth (INDEPENDENT_AMBULATORY_CARE_PROVIDER_SITE_OTHER): Payer: Self-pay | Admitting: Urology

## 2024-05-05 NOTE — Progress Notes (Signed)
 Patient has confirmed he would like to proceed with ST-ADT and IMRT.  Patient aware recommendations may change if we have any unexpected findings on his upcoming PSMA PET scan on 8/4.    Plan of care in progress.

## 2024-05-05 NOTE — Telephone Encounter (Signed)
 Rad Onc update - planning patient would like to proceed with ST-ADT followed by IMRT.   Patient is scheduled for PSMA PET on 8/4.  He is aware that if we have any unexpected findings, recommendations may change.   He needs nurse visit for Eligard 45 mg injection on 05/13/2024 or shortly thereafter.

## 2024-05-06 NOTE — Telephone Encounter (Signed)
 Authorization pending

## 2024-05-06 NOTE — Telephone Encounter (Signed)
 Hope is working on authorization then will schedule

## 2024-05-08 NOTE — Telephone Encounter (Signed)
 Approved.

## 2024-05-12 ENCOUNTER — Encounter (HOSPITAL_COMMUNITY)
Admission: RE | Admit: 2024-05-12 | Discharge: 2024-05-12 | Disposition: A | Discharge status: Home / Self Care | Source: Ambulatory Visit | Attending: Radiation Oncology | Admitting: Radiation Oncology

## 2024-05-12 DIAGNOSIS — C61 Malignant neoplasm of prostate: Secondary | ICD-10-CM | POA: Insufficient documentation

## 2024-05-12 DIAGNOSIS — R918 Other nonspecific abnormal finding of lung field: Secondary | ICD-10-CM | POA: Diagnosis not present

## 2024-05-12 MED ORDER — FLOTUFOLASTAT F 18 GALLIUM 296-5846 MBQ/ML IV SOLN
8.5100 | Freq: Once | INTRAVENOUS | Status: AC
  Administered 2024-05-12: 8.51 via INTRAVENOUS

## 2024-05-13 ENCOUNTER — Ambulatory Visit: Admitting: Radiation Oncology

## 2024-05-13 ENCOUNTER — Ambulatory Visit

## 2024-05-14 ENCOUNTER — Telehealth: Payer: Self-pay

## 2024-05-14 NOTE — Telephone Encounter (Signed)
 Called pt to schedule injections pt did not answer lvm to c/b to have NV scheduled

## 2024-05-14 NOTE — Telephone Encounter (Signed)
-----   Message from Donnice Brooks sent at 05/13/2024  5:21 PM EDT ----- Regarding: RE: ADT Apt Eligard  45 mg just once -- thanks! When he gets it please let Dr. Dannielle' office know at [facility] Radiation Oncology so they can plan radiation. Thank you! ----- Message ----- From: Sammie Acosta HERO, CMA Sent: 05/13/2024  11:43 AM EDT To: Donnice Brooks, MD; Almarie Pont, RN Subject: RE: ADT Apt                                    I can Dr. FORBES can you confirm needed dosage for appt and frequency ? ----- Message ----- From: Pont Almarie, RN Sent: 05/13/2024  10:43 AM EDT To: Acosta HERO Sammie, CMA Subject: ADT Apt                                        Troy Acosta, hope your week is starting off good!  Would you be able to help me get this gentleman in for a nurse visit to start his ADT???  Thank you!  Troy Acosta

## 2024-05-15 NOTE — Progress Notes (Signed)
 STAT read request for PSMA PET.   ADT date, pending.

## 2024-05-16 NOTE — Progress Notes (Signed)
 Patient notified of PSMA PET results after MD review.    Patient is now scheduled for ADT on 8/12 @ 2pm.  Patient aware.

## 2024-05-20 ENCOUNTER — Ambulatory Visit (INDEPENDENT_AMBULATORY_CARE_PROVIDER_SITE_OTHER)

## 2024-05-20 DIAGNOSIS — C61 Malignant neoplasm of prostate: Secondary | ICD-10-CM

## 2024-05-20 MED ORDER — LEUPROLIDE ACETATE (6 MONTH) 45 MG IM KIT: 45.0000 mg | PACK | INTRAMUSCULAR | Status: DC

## 2024-05-20 MED ORDER — LEUPROLIDE ACETATE (6 MONTH) 45 MG ~~LOC~~ KIT
45.0000 mg | PACK | SUBCUTANEOUS | Status: AC
  Administered 2024-05-20 (×2): 45 mg via SUBCUTANEOUS

## 2024-05-20 NOTE — Progress Notes (Signed)
 Patient received Eligard  45mg  today under urology.   Pending fiducial marker's and spaceOAR gel placement.  Will continue to follow.

## 2024-05-20 NOTE — Progress Notes (Signed)
 Eligard  SubQ Injection   Due to Prostate Cancer patient is present today for a Eligard  Injection. Order reviewed. Prior Authorization reviewed.  Medication: Eligard  6 month Dose: 45 mg  Location: left hip  site prepped and cleaned with alcohol prior to giving injection.   Patient tolerated well, no complications were noted  Performed by:Johnedward Brodrick LPN

## 2024-06-05 ENCOUNTER — Encounter: Payer: Self-pay | Admitting: Cardiovascular Disease

## 2024-06-05 ENCOUNTER — Ambulatory Visit: Attending: Cardiology | Admitting: Cardiovascular Disease

## 2024-06-05 VITALS — BP 140/86 | HR 62 | Ht 71.0 in | Wt 245.0 lb

## 2024-06-05 DIAGNOSIS — I480 Paroxysmal atrial fibrillation: Secondary | ICD-10-CM

## 2024-06-05 NOTE — Patient Instructions (Signed)
 Medication Instructions:  Your physician has recommended you make the following change in your medication:   ** Begin Crestor  (Rosuvastatin ) 10mg  - You may take your Rosuvastatin  5mg  - 2 tablets by mouth daily until you see your PCP.   *If you need a refill on your cardiac medications before your next appointment, please call your pharmacy*  Lab Work: None ordered.  If you have labs (blood work) drawn today and your tests are completely normal, you will receive your results only by: MyChart Message (if you have MyChart) OR A paper copy in the mail If you have any lab test that is abnormal or we need to change your treatment, we will call you to review the results.  Testing/Procedures: None ordered.   Follow-Up: At [facility], you and your health needs are our priority.  As part of our continuing mission to provide you with exceptional heart care, our providers are all part of one team.  This team includes your primary Cardiologist (physician) and Advanced Practice Providers or APPs (Physician Assistants and Nurse Practitioners) who all work together to provide you with the care you need, when you need it.  Your next appointment:   12 months with Dr Mealor

## 2024-06-05 NOTE — Progress Notes (Signed)
   PCP: Dettinger, Fonda LABOR, MD   Primary EP: Indiya Izquierdo  Troy Acosta is a 73 y.o. male who presents today for routine electrophysiology followup.  Since last being seen in our clinic, the patient reports doing very well.    He had a PET/CT for prostate cancer screening that showed initial finding of right coronary artery calcification.  Today, he denies symptoms of palpitations, chest pain, shortness of breath,  lower extremity edema, dizziness, presyncope, or syncope.  The patient is otherwise without complaint today.     Physical Exam: Vitals:   06/05/24 1109  BP: (!) 140/86  Pulse: 62  SpO2: 98%  Weight: 245 lb (111.1 kg)  Height: 5' 11 (1.803 m)    Gen: Appears comfortable, well-nourished CV: RRR, no dependent edema Pulm: breathing easily   Wt Readings from Last 3 Encounters:  06/05/24 245 lb (111.1 kg)  04/29/24 253 lb (114.8 kg)  12/21/23 252 lb (114.3 kg)    Assessment and Plan:  Paroxysmal atrial fibrillation Well controlled post ablation off AAD Chads2vasc score is 2.  He is not on anticoagulation, confident that he has not had any recurrence of AF. He understands that there is some risk of stroke and continues on ASA 81. (See prior notes)  2. HTN Stable No change required today  3. Overweight Body mass index is 34.17 kg/m. Lifestyle modification advised  4. Tachycardia mediated CM Resolved  5. Coronary calcium  on PET No symptoms, negative stress test in 2017 Will increase Rosuvastatin  to 10mg  He may receive refills for this from his PCP  Return in a year  Troy FORBES Furbish, MD 06/05/2024 11:30 AM

## 2024-06-22 ENCOUNTER — Other Ambulatory Visit: Payer: Self-pay | Admitting: Family Medicine

## 2024-06-22 DIAGNOSIS — I10 Essential (primary) hypertension: Secondary | ICD-10-CM

## 2024-06-23 ENCOUNTER — Ambulatory Visit (INDEPENDENT_AMBULATORY_CARE_PROVIDER_SITE_OTHER): Admitting: Family Medicine

## 2024-06-23 ENCOUNTER — Encounter: Payer: Self-pay | Admitting: Family Medicine

## 2024-06-23 ENCOUNTER — Ambulatory Visit: Payer: Self-pay | Admitting: Family Medicine

## 2024-06-23 ENCOUNTER — Telehealth: Payer: Self-pay | Admitting: Urology

## 2024-06-23 VITALS — BP 138/86 | HR 60 | Ht 71.0 in | Wt 244.0 lb

## 2024-06-23 DIAGNOSIS — E7849 Other hyperlipidemia: Secondary | ICD-10-CM

## 2024-06-23 DIAGNOSIS — C61 Malignant neoplasm of prostate: Secondary | ICD-10-CM

## 2024-06-23 DIAGNOSIS — Z23 Encounter for immunization: Secondary | ICD-10-CM

## 2024-06-23 DIAGNOSIS — E038 Other specified hypothyroidism: Secondary | ICD-10-CM | POA: Diagnosis not present

## 2024-06-23 DIAGNOSIS — I10 Essential (primary) hypertension: Secondary | ICD-10-CM

## 2024-06-23 DIAGNOSIS — R7309 Other abnormal glucose: Secondary | ICD-10-CM | POA: Diagnosis not present

## 2024-06-23 LAB — BAYER DCA HB A1C WAIVED: HB A1C (BAYER DCA - WAIVED): 5.9 % — ABNORMAL HIGH (ref 4.8–5.6)

## 2024-06-23 LAB — LIPID PANEL

## 2024-06-23 MED ORDER — POLYSACCHARIDE IRON COMPLEX 150 MG PO CAPS: 150.0000 mg | ORAL_CAPSULE | Freq: Every day | ORAL | 3 refills | Status: DC

## 2024-06-23 NOTE — Telephone Encounter (Signed)
 Has prostate cancer and is supposed to be having markers put in and he has not heard anything and it has been 5 weeks

## 2024-06-23 NOTE — Progress Notes (Signed)
 BP 138/86   Pulse 60   Ht 5' 11 (1.803 m)   Wt 244 lb (110.7 kg)   SpO2 99%   BMI 34.03 kg/m    Subjective:   Patient ID: Troy Acosta, male    DOB: 13-Jun-1951, 73 y.o.   MRN: [id number]  HPI: Troy Acosta is a 73 y.o. male presenting on 06/23/2024 for Medical Management of Chronic Issues, Hypertension, Hyperlipidemia, and Hypothyroidism   Discussed the use of AI scribe software for clinical note transcription with the patient, who gave verbal consent to proceed.  History of Present Illness   Troy Acosta is a 73 year old male with prostate cancer who presents for follow-up on his cancer treatment and management.  He has been diagnosed with prostate cancer, which is currently confined to the prostate and is considered low grade. He received a hormone shot (Lupron ) and is awaiting further treatment, scheduled to begin six weeks post-injection. Currently, he is five weeks post-injection and experiencing delays in communication regarding the placement of markers in his prostate for treatment planning.  He is currently taking amlodipine , olmesartan , hydrochlorothiazide , and 5 mg of rosuvastatin  for cholesterol management. He has a history of atrial fibrillation and calcium  buildup around his heart, as noted in a recent PET scan. He has an appointment with a cardiologist to address these findings.  He is also managing his thyroid  condition and borderline A1c levels, requesting blood work to monitor these conditions. He has a history of skin issues and mentions a need to manage stress and anxiety, particularly in light of his wife's recent health issues.  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 His wife is actively involved in his church community and is a 'big flower person,' indicating a shared interest in gardening.      Relevant past medical, surgical, family and social history reviewed and updated as indicated. Interim medical  history since our last visit reviewed. Allergies and medications reviewed and updated.  Review of Systems  Constitutional:  Negative for chills and fever.  Eyes:  Negative for visual disturbance.  Respiratory:  Negative for shortness of breath and wheezing.   Cardiovascular:  Negative for chest pain and leg swelling.  Musculoskeletal:  Negative for back pain and gait problem.  Skin:  Negative for rash.  Neurological:  Negative for dizziness and light-headedness.  All other systems reviewed and are negative.   Per HPI unless specifically indicated above   Allergies as of 06/23/2024   No Known Allergies      Medication List        Accurate as of June 23, 2024  8:15 AM. If you have any questions, ask your nurse or doctor.          acetaminophen  500 MG tablet Commonly known as: TYLENOL  Take 500 mg by mouth in the morning and at bedtime.   allopurinol  100 MG tablet Commonly known as: ZYLOPRIM  Take 2 tablets (200 mg total) by mouth daily.   amLODipine  5 MG tablet Commonly known as: NORVASC  Take 1 tablet (5 mg total) by mouth daily.   aspirin  EC 81 MG tablet Take 81 mg by mouth daily.   hydrochlorothiazide  25 MG tablet Commonly known as: HYDRODIURIL  Take 1 tablet (25 mg total) by mouth daily.   iron  polysaccharides 150 MG capsule Commonly known as: Ferrex 150 Take 1 capsule (150 mg total) by mouth daily.   levothyroxine  200 MCG tablet Commonly known as: SYNTHROID   Take 1 tablet (200 mcg total) by mouth daily before breakfast.   olmesartan  40 MG tablet Commonly known as: BENICAR  Take 0.5 tablets (20 mg total) by mouth 2 (two) times daily.   vitamin B-12 100 MCG tablet Commonly known as: CYANOCOBALAMIN  Take 100 mcg by mouth daily.   Vitamin D  (Cholecalciferol ) 25 MCG (1000 UT) Caps Take by mouth.         Objective:   BP 138/86   Pulse 60   Ht 5' 11 (1.803 m)   Wt 244 lb (110.7 kg)   SpO2 99%   BMI 34.03 kg/m   Wt Readings from Last 3  Encounters:  06/23/24 244 lb (110.7 kg)  06/05/24 245 lb (111.1 kg)  04/29/24 253 lb (114.8 kg)    Physical Exam Vitals and nursing note reviewed.    Physical Exam   VITALS: BP- 138/86 NECK: Thyroid  normal on palpation. CHEST: Lungs clear to auscultation bilaterally. CARDIOVASCULAR: Heart regular rate and rhythm, no murmurs. EXTREMITIES: No edema in lower extremities.         Assessment & Plan:   Problem List Items Addressed This Visit       Cardiovascular and Mediastinum   Essential hypertension   Relevant Medications   iron  polysaccharides (FERREX 150) 150 MG capsule   Other Relevant Orders   Bayer DCA Hb A1c Waived   CBC with Differential/Platelet   CMP14+EGFR     Endocrine   Hypothyroidism - Primary   Relevant Orders   TSH     Genitourinary   Prostate cancer (HCC)   Malignant neoplasm of prostate (HCC)     Other   Hyperlipidemia   Relevant Orders   Bayer DCA Hb A1c Waived   Lipid panel       Prostate cancer Low-grade, confined to prostate. Awaiting further treatment. Delay in communication for radiation therapy markers. - Contact urology to discuss placement of markers for radiation therapy.  Essential hypertension Blood pressure 138/86 mmHg, likely stress-related. - Continue amlodipine , olmesartan , hydrochlorothiazide .  Other hyperlipidemia On 5 mg rosuvastatin . Cardiologist suggested dosage increase due to calcium  buildup on PET scan. - Check cholesterol levels with blood work. - Consider increasing rosuvastatin  if cholesterol levels are high.  Other specified hypothyroidism Thyroid  examination normal. - Check thyroid  function with blood work.  General Health Maintenance Received flu shot. Discussed need for dermatological evaluations. - Encourage dermatology follow-up for skin evaluation.          Follow up plan: Return in about 6 months (around 12/21/2024), or if symptoms worsen or fail to improve, for Physical exam and recheck thyroid   and cholesterol.  Counseling provided for all of the vaccine components Orders Placed This Encounter  Procedures   Bayer DCA Hb A1c Waived   CBC with Differential/Platelet   CMP14+EGFR   Lipid panel   TSH    Fonda Levins, MD [facility] 06/23/2024, 8:15 AM

## 2024-06-24 ENCOUNTER — Other Ambulatory Visit: Payer: Self-pay

## 2024-06-24 DIAGNOSIS — C61 Malignant neoplasm of prostate: Secondary | ICD-10-CM

## 2024-06-24 LAB — CMP14+EGFR
ALT: 31 IU/L (ref 0–44)
AST: 22 IU/L (ref 0–40)
Albumin: 4.4 g/dL (ref 3.9–4.9)
Alkaline Phosphatase: 79 IU/L (ref 49–135)
BUN/Creatinine Ratio: 24 (ref 10–24)
BUN: 23 mg/dL (ref 8–27)
Bilirubin Total: 0.5 mg/dL (ref 0.0–1.2)
CO2: 25 mmol/L (ref 20–29)
Calcium: 9.8 mg/dL (ref 8.6–10.2)
Chloride: 98 mmol/L (ref 96–106)
Creatinine, Ser: 0.95 mg/dL (ref 0.76–1.27)
Globulin, Total: 2.8 g/dL (ref 1.5–4.5)
Glucose: 107 mg/dL — ABNORMAL HIGH (ref 70–99)
Potassium: 5.1 mmol/L (ref 3.5–5.2)
Sodium: 137 mmol/L (ref 134–144)
Total Protein: 7.2 g/dL (ref 6.0–8.5)
eGFR: 86 mL/min/1.73 (ref >59)

## 2024-06-24 LAB — TSH: TSH: 2.19 u[IU]/mL (ref 0.450–4.500)

## 2024-06-24 LAB — CBC WITH DIFFERENTIAL/PLATELET
Basophils Absolute: 0 x10E3/uL (ref 0.0–0.2)
Basos: 0 %
EOS (ABSOLUTE): 0.1 x10E3/uL (ref 0.0–0.4)
Eos: 1 %
Hematocrit: 48.8 % (ref 37.5–51.0)
Hemoglobin: 15.9 g/dL (ref 13.0–17.7)
Immature Grans (Abs): 0 x10E3/uL (ref 0.0–0.1)
Immature Granulocytes: 0 %
Lymphocytes Absolute: 2.4 x10E3/uL (ref 0.7–3.1)
Lymphs: 27 %
MCH: 29.1 pg (ref 26.6–33.0)
MCHC: 32.6 g/dL (ref 31.5–35.7)
MCV: 89 fL (ref 79–97)
Monocytes Absolute: 0.6 x10E3/uL (ref 0.1–0.9)
Monocytes: 7 %
Neutrophils Absolute: 5.9 x10E3/uL (ref 1.4–7.0)
Neutrophils: 65 %
Platelets: 208 x10E3/uL (ref 150–450)
RBC: 5.46 x10E6/uL (ref 4.14–5.80)
RDW: 12.7 % (ref 11.6–15.4)
WBC: 9.1 x10E3/uL (ref 3.4–10.8)

## 2024-06-24 LAB — LIPID PANEL
Cholesterol, Total: 130 mg/dL (ref 100–199)
HDL: 34 mg/dL — AB (ref >39)
LDL CALC COMMENT:: 3.8 ratio (ref 0.0–5.0)
LDL Chol Calc (NIH): 68 mg/dL (ref 0–99)
Triglycerides: 164 mg/dL — AB (ref 0–149)
VLDL Cholesterol Cal: 28 mg/dL (ref 5–40)

## 2024-06-24 MED ORDER — FLEET ENEMA RE ENEM: 1.0000 | ENEMA | Freq: Once | RECTAL | 0 refills | Status: AC

## 2024-06-24 NOTE — Telephone Encounter (Signed)
 I will give patient a tentative date and coordinate this with Vertell.  Please provide surgery orders.

## 2024-06-25 ENCOUNTER — Other Ambulatory Visit: Payer: Self-pay | Admitting: Urology

## 2024-06-25 DIAGNOSIS — C61 Malignant neoplasm of prostate: Secondary | ICD-10-CM

## 2024-06-26 NOTE — Progress Notes (Signed)
 Patient is now scheduled for fiducial marker's and spaceOAR gel placement on 10/2 followed by CT Simulation on 10/13.  RN left message for call back to review next steps.

## 2024-06-26 NOTE — Telephone Encounter (Signed)
 Orders added per MD- surgery coordinated with patient.

## 2024-06-27 ENCOUNTER — Telehealth: Payer: Self-pay

## 2024-06-27 DIAGNOSIS — I10 Essential (primary) hypertension: Secondary | ICD-10-CM

## 2024-06-27 MED ORDER — POLYSACCHARIDE IRON COMPLEX 150 MG PO CAPS: 150.0000 mg | ORAL_CAPSULE | Freq: Every day | ORAL | 3 refills | Status: AC

## 2024-06-27 MED ORDER — POLYSACCHARIDE IRON COMPLEX 150 MG PO CAPS: 150.0000 mg | ORAL_CAPSULE | Freq: Every day | ORAL | 3 refills | Status: DC

## 2024-06-27 NOTE — Telephone Encounter (Signed)
 Copied from CRM [id number]. Topic: Clinical - Medication Question >> Jun 27, 2024 11:44 AM Myrick T wrote: Reason for CRM: patients wife called said the pharmacy said they never recvd the script for iron  polysaccharides (FERREX 150) 150 MG capsule. Please f/u with pharmacy.

## 2024-06-27 NOTE — Addendum Note (Signed)
 Addended by: MICHELINE KNEE F on: 06/27/2024 02:56 PM   Modules accepted: Orders

## 2024-06-27 NOTE — Telephone Encounter (Signed)
 Called patient it will not escribe will print and have it faxed patient aware.

## 2024-06-27 NOTE — Progress Notes (Signed)
 RN spoke with patient and provided education on next steps with fiducial marker's, spaceOAR, CT Sim/MR.   All questions answered. No additional needs at this time.

## 2024-07-07 ENCOUNTER — Emergency Department (HOSPITAL_BASED_OUTPATIENT_CLINIC_OR_DEPARTMENT_OTHER)
Admission: EM | Admit: 2024-07-07 | Discharge: 2024-07-07 | Disposition: A | Discharge status: Home / Self Care | Attending: Emergency Medicine | Admitting: Emergency Medicine

## 2024-07-07 ENCOUNTER — Encounter (HOSPITAL_BASED_OUTPATIENT_CLINIC_OR_DEPARTMENT_OTHER): Payer: Self-pay | Admitting: Emergency Medicine

## 2024-07-07 ENCOUNTER — Other Ambulatory Visit: Payer: Self-pay

## 2024-07-07 ENCOUNTER — Emergency Department (HOSPITAL_BASED_OUTPATIENT_CLINIC_OR_DEPARTMENT_OTHER)

## 2024-07-07 DIAGNOSIS — D72829 Elevated white blood cell count, unspecified: Secondary | ICD-10-CM | POA: Diagnosis not present

## 2024-07-07 DIAGNOSIS — Z8546 Personal history of malignant neoplasm of prostate: Secondary | ICD-10-CM | POA: Diagnosis not present

## 2024-07-07 DIAGNOSIS — R0789 Other chest pain: Secondary | ICD-10-CM | POA: Insufficient documentation

## 2024-07-07 DIAGNOSIS — Z79899 Other long term (current) drug therapy: Secondary | ICD-10-CM | POA: Diagnosis not present

## 2024-07-07 DIAGNOSIS — R079 Chest pain, unspecified: Secondary | ICD-10-CM | POA: Diagnosis not present

## 2024-07-07 DIAGNOSIS — Z7982 Long term (current) use of aspirin: Secondary | ICD-10-CM | POA: Insufficient documentation

## 2024-07-07 DIAGNOSIS — I10 Essential (primary) hypertension: Secondary | ICD-10-CM | POA: Diagnosis not present

## 2024-07-07 DIAGNOSIS — I700 Atherosclerosis of aorta: Secondary | ICD-10-CM | POA: Diagnosis not present

## 2024-07-07 DIAGNOSIS — I771 Stricture of artery: Secondary | ICD-10-CM | POA: Diagnosis not present

## 2024-07-07 LAB — BASIC METABOLIC PANEL WITH GFR
Anion gap: 15 (ref 5–15)
BUN: 20 mg/dL (ref 8–23)
CO2: 24 mmol/L (ref 22–32)
Calcium: 10.4 mg/dL — ABNORMAL HIGH (ref 8.9–10.3)
Chloride: 97 mmol/L — ABNORMAL LOW (ref 98–111)
Creatinine, Ser: 0.9 mg/dL (ref 0.61–1.24)
GFR, Estimated: >60 mL/min (ref >60)
Glucose, Bld: 110 mg/dL — ABNORMAL HIGH (ref 70–99)
Potassium: 4.2 mmol/L (ref 3.5–5.1)
Sodium: 136 mmol/L (ref 135–145)

## 2024-07-07 LAB — CBC
HCT: 43.6 % (ref 39.0–52.0)
Hemoglobin: 15.2 g/dL (ref 13.0–17.0)
MCH: 29.7 pg (ref 26.0–34.0)
MCHC: 34.9 g/dL (ref 30.0–36.0)
MCV: 85.2 fL (ref 80.0–100.0)
Platelets: 178 K/uL (ref 150–400)
RBC: 5.12 MIL/uL (ref 4.22–5.81)
RDW: 12.7 % (ref 11.5–15.5)
WBC: 10.8 K/uL — ABNORMAL HIGH (ref 4.0–10.5)
nRBC: 0 % (ref 0.0–0.2)

## 2024-07-07 LAB — TROPONIN T, HIGH SENSITIVITY
Troponin T High Sensitivity: <15 ng/L (ref 0–19)
Troponin T High Sensitivity: <15 ng/L (ref 0–19)

## 2024-07-07 LAB — D-DIMER, QUANTITATIVE: D-Dimer, Quant: <0.27 ug{FEU}/mL (ref 0.00–0.50)

## 2024-07-07 NOTE — ED Notes (Signed)
 RN reviewed discharge instructions with pt. Pt verbalized understanding and had no further questions. VSS upon discharge.

## 2024-07-07 NOTE — ED Triage Notes (Signed)
 Pt c/o hypertension, weakness and L chest discomfort that started this morning. States the chest pain may be from sleeping wrong-twitching feeling.

## 2024-07-07 NOTE — ED Notes (Signed)
 Call to lab to add d-dimer to tube already sent to lab

## 2024-07-07 NOTE — Discharge Instructions (Addendum)
 Your EKG, chest x-ray and screening laboratory evaluation was overall reassuring.  Your workup points away from heart attack or blood clot or other acute abnormality in your chest as the cause of your symptoms.  Recommend you follow-up outpatient with your primary care provider for continued management.  Return for any severe worsening chest pain, shortness of breath or any other concerns.

## 2024-07-07 NOTE — ED Provider Notes (Addendum)
 [facility] DE[facility]MENT AT [facility] Provider Note   CSN: [id number] Arrival date & time: 07/07/24  1040     Patient presents with: Hypertension and Chest Pain   Troy Acosta is a 73 y.o. male.    Hypertension Associated symptoms include chest pain.  Chest Pain    73 year old male with medical history significant for prostate cancer (reportedly confined to the prostate with no metastatic disease), HTN, HLD, paroxysmal atrial fibrillation (not on anticoagulation, s/p ablation), nonischemic cardiomyopathy, presenting to the emergency department with chest pain.  The patient states that this morning he developed acute onset left-sided chest discomfort, described as a dull ache, located in the left upper chest near his shoulder with some radiation to the scapula, associated mild weakness and elevated blood pressures compared to baseline in the 170s systolic.  He denies any shortness of breath, low no lower extremity swelling, no radiation of pain down his arm or to his jaw.  Pain has persisted which prompted his presentation to the emergency department.  He denies any history of DVT or PE.  He denies any worsening pain with twisting or movement.  No reproducible palpable pain along his chest wall and he denies any rash.  No cough fever or chills.  Prior to Admission medications   Medication Sig Start Date End Date Taking? Authorizing Provider  acetaminophen  (TYLENOL ) 500 MG tablet Take 500 mg by mouth in the morning and at bedtime.    [provider]  allopurinol  (ZYLOPRIM ) 100 MG tablet Take 2 tablets (200 mg total) by mouth daily. 12/21/23   Dettinger, Fonda LABOR, MD  amLODipine  (NORVASC ) 5 MG tablet Take 1 tablet (5 mg total) by mouth daily. 12/21/23   Dettinger, Fonda LABOR, MD  aspirin  EC 81 MG tablet Take 81 mg by mouth daily.    [provider]  hydrochlorothiazide  (HYDRODIURIL ) 25 MG tablet Take 1 tablet (25 mg total) by mouth daily. 12/21/23   Dettinger,  Fonda LABOR, MD  iron  polysaccharides (FERREX 150) 150 MG capsule Take 1 capsule (150 mg total) by mouth daily. 06/27/24   Dettinger, Fonda LABOR, MD  levothyroxine  (SYNTHROID ) 200 MCG tablet Take 1 tablet (200 mcg total) by mouth daily before breakfast. 12/21/23   Dettinger, Fonda LABOR, MD  olmesartan  (BENICAR ) 40 MG tablet Take 0.5 tablets (20 mg total) by mouth 2 (two) times daily. 12/21/23   Dettinger, Fonda LABOR, MD  vitamin B-12 (CYANOCOBALAMIN ) 100 MCG tablet Take 100 mcg by mouth daily.    [provider]  Vitamin D , Cholecalciferol , 25 MCG (1000 UT) CAPS Take by mouth.    [provider]    Allergies: Patient has no known allergies.    Review of Systems  Cardiovascular:  Positive for chest pain.  All other systems reviewed and are negative.   Updated Vital Signs BP 120/72   Pulse (!) 51   Temp 98.1 F (36.7 C) (Oral)   Resp 17   SpO2 99%   Physical Exam Vitals and nursing note reviewed.  Constitutional:      General: He is not in acute distress.    Appearance: He is well-developed.  HENT:     Head: Normocephalic and atraumatic.  Eyes:     Conjunctiva/sclera: Conjunctivae normal.  Cardiovascular:     Rate and Rhythm: Normal rate and regular rhythm.     Heart sounds: No murmur heard. Pulmonary:     Effort: Pulmonary effort is normal. No respiratory distress.     Breath sounds: Normal breath  sounds.  Chest:     Comments: No chest wall tenderness to palpation, no rash Abdominal:     Palpations: Abdomen is soft.     Tenderness: There is no abdominal tenderness.  Musculoskeletal:        General: No swelling.     Cervical back: Neck supple.  Skin:    General: Skin is warm and dry.     Capillary Refill: Capillary refill takes less than 2 seconds.  Neurological:     Mental Status: He is alert.  Psychiatric:        Mood and Affect: Mood normal.     (all labs ordered are listed, but only abnormal results are displayed) Labs Reviewed  BASIC METABOLIC PANEL  WITH GFR - Abnormal; Notable for the following components:      Result Value   Chloride 97 (*)    Glucose, Bld 110 (*)    Calcium  10.4 (*)    All other components within normal limits  CBC - Abnormal; Notable for the following components:   WBC 10.8 (*)    All other components within normal limits  D-DIMER, QUANTITATIVE  TROPONIN T, HIGH SENSITIVITY  TROPONIN T, HIGH SENSITIVITY    EKG: EKG Interpretation Date/Time:  Monday July 07 2024 11:02:57 EDT Ventricular Rate:  66 PR Interval:  154 QRS Duration:  96 QT Interval:  402 QTC Calculation: 421 R Axis:   -14  Text Interpretation: Normal sinus rhythm Normal ECG When compared with ECG of 05-Jun-2024 11:04, No significant change was found Confirmed by Jerrol Agent ([id number]) on 07/07/2024 12:22:27 PM  Radiology: [facility] Chest Port 1 View Result Date: 07/07/2024 CLINICAL DATA:  CP EXAM: PORTABLE CHEST - 1 VIEW COMPARISON:  07/30/2022 FINDINGS: No focal airspace consolidation, pleural effusion, or pneumothorax. No cardiomegaly. Tortuous aorta with aortic atherosclerosis. No acute fracture or destructive lesions. Multilevel thoracic osteophytosis. IMPRESSION: No acute cardiopulmonary abnormality. Electronically Signed   By: Rogelia Myers M.D.   On: 07/07/2024 13:35     Procedures   Medications Ordered in the ED - No data to display              HEART Score: 3                    Medical Decision Making Amount and/or Complexity of Data Reviewed Labs: ordered. Radiology: ordered.     73 year old male with medical history significant for prostate cancer (reportedly confined to the prostate with no metastatic disease), HTN, HLD, paroxysmal atrial fibrillation (not on anticoagulation, s/p ablation), nonischemic cardiomyopathy, presenting to the emergency department with chest pain.  The patient states that this morning he developed acute onset left-sided chest discomfort, described as a dull ache, located in the left upper chest near  his shoulder with some radiation to the scapula, associated mild weakness and elevated blood pressures compared to baseline in the 170s systolic.  He denies any shortness of breath, low no lower extremity swelling, no radiation of pain down his arm or to his jaw.  Pain has persisted which prompted his presentation to the emergency department.  He denies any history of DVT or PE.  He denies any worsening pain with twisting or movement.  No reproducible palpable pain along his chest wall and he denies any rash.  No cough fever or chills.  On arrival, the patient was vitally stable, afebrile, not tachycardic heart rate 66, BP 153/95, respiratory rate 17, saturating 100% on room air.  Patient on exam had clear lungs  auscultation bilaterally and no specific musculoskeletal tenderness to palpation with no rash noted.  He presents with dull left-sided chest pain that is vague and slightly atypical.  Differential diagnosis includes ACS, pneumothorax, pneumonia, viral infection, less likely musculoskeletal pain given reassuring exam, considered PE.  Will obtain EKG, chest x-ray and laboratory evaluation to include D-dimer.  EKG: Ventricular rate 6 6, sinus rhythm noted, no acute ischemic changes or abnormal intervals.  Chest x-ray no acute cardiopulmonary disease, no focal consolidation or pneumothorax.  Labs: CBC with a nonspecific leukocytosis to 10.8, no anemia, D-dimer negative at 0.27.  In the setting of negative D-dimer, low concern for PE.  Initial cardiac troponin 15, repeat cardiac troponin pending.  BMP generally unremarkable.  Heart score of 3.  Repeat troponin also flat at 15.  Patient reassessed and feeling improved.  Overall low risk presentation, reassuring presentation with delta troponins that are negative.  Recommended outpatient PCP follow-up, return precautions provided.     Final diagnoses:  Chest pain, unspecified type    ED Discharge Orders     None          Jerrol Agent,  MD 07/07/24 [id number]    Jerrol Agent, MD 07/07/24 1512

## 2024-07-08 ENCOUNTER — Encounter (HOSPITAL_COMMUNITY): Payer: Self-pay

## 2024-07-08 ENCOUNTER — Encounter (HOSPITAL_COMMUNITY)
Admission: RE | Admit: 2024-07-08 | Discharge: 2024-07-08 | Disposition: A | Discharge status: Home / Self Care | Source: Ambulatory Visit | Attending: Urology | Admitting: Urology

## 2024-07-08 ENCOUNTER — Other Ambulatory Visit: Payer: Self-pay

## 2024-07-08 NOTE — Progress Notes (Signed)
 Secure chat sent to Dr.McKenzie and Dr.Ewell to review due to patient visit to ED yesterday, he is for surgery on 07/10/24.

## 2024-07-10 ENCOUNTER — Other Ambulatory Visit: Payer: Self-pay | Admitting: Urology

## 2024-07-10 ENCOUNTER — Ambulatory Visit (HOSPITAL_COMMUNITY)
Admission: RE | Admit: 2024-07-10 | Discharge: 2024-07-10 | Disposition: A | Source: Ambulatory Visit | Attending: Urology | Admitting: Urology

## 2024-07-10 ENCOUNTER — Encounter (HOSPITAL_COMMUNITY): Payer: Self-pay | Admitting: Urology

## 2024-07-10 ENCOUNTER — Other Ambulatory Visit: Payer: Self-pay

## 2024-07-10 ENCOUNTER — Ambulatory Visit (HOSPITAL_COMMUNITY): Admitting: Anesthesiology

## 2024-07-10 ENCOUNTER — Ambulatory Visit (HOSPITAL_COMMUNITY)

## 2024-07-10 ENCOUNTER — Encounter (HOSPITAL_COMMUNITY)
Admission: RE | Disposition: A | Discharge status: Home / Self Care | Payer: Self-pay | Source: Home / Self Care | Attending: Urology

## 2024-07-10 ENCOUNTER — Ambulatory Visit (HOSPITAL_COMMUNITY)
Admission: RE | Admit: 2024-07-10 | Discharge: 2024-07-10 | Disposition: A | Discharge status: Home / Self Care | Attending: Urology | Admitting: Urology

## 2024-07-10 ENCOUNTER — Ambulatory Visit (HOSPITAL_BASED_OUTPATIENT_CLINIC_OR_DEPARTMENT_OTHER): Admitting: Anesthesiology

## 2024-07-10 DIAGNOSIS — M1990 Unspecified osteoarthritis, unspecified site: Secondary | ICD-10-CM | POA: Diagnosis not present

## 2024-07-10 DIAGNOSIS — Z87891 Personal history of nicotine dependence: Secondary | ICD-10-CM | POA: Diagnosis not present

## 2024-07-10 DIAGNOSIS — I4891 Unspecified atrial fibrillation: Secondary | ICD-10-CM | POA: Diagnosis not present

## 2024-07-10 DIAGNOSIS — C61 Malignant neoplasm of prostate: Secondary | ICD-10-CM

## 2024-07-10 DIAGNOSIS — I10 Essential (primary) hypertension: Secondary | ICD-10-CM | POA: Diagnosis not present

## 2024-07-10 DIAGNOSIS — I480 Paroxysmal atrial fibrillation: Secondary | ICD-10-CM | POA: Diagnosis not present

## 2024-07-10 DIAGNOSIS — E039 Hypothyroidism, unspecified: Secondary | ICD-10-CM | POA: Insufficient documentation

## 2024-07-10 HISTORY — PX: GOLD SEED IMPLANT: SHX6343

## 2024-07-10 HISTORY — PX: SPACE OAR INSTILLATION: SHX6769

## 2024-07-10 SURGERY — INSERTION, GOLD SEEDS
Anesthesia: General | Site: Prostate

## 2024-07-10 MED ORDER — ONDANSETRON HCL 4 MG/2ML IJ SOLN
INTRAMUSCULAR | Status: AC
  Filled 2024-07-10: qty 6

## 2024-07-10 MED ORDER — TRAMADOL HCL 50 MG PO TABS: 50.0000 mg | ORAL_TABLET | Freq: Four times a day (QID) | ORAL | 0 refills | Status: AC | PRN

## 2024-07-10 MED ORDER — OXYCODONE HCL 5 MG/5ML PO SOLN: 5.0000 mg | Freq: Once | ORAL | Status: DC | PRN

## 2024-07-10 MED ORDER — PROPOFOL 10 MG/ML IV BOLUS
INTRAVENOUS | Status: DC | PRN
  Administered 2024-07-10: 100 mg via INTRAVENOUS
  Administered 2024-07-10: 200 mg via INTRAVENOUS

## 2024-07-10 MED ORDER — FENTANYL CITRATE (PF) 100 MCG/2ML IJ SOLN
INTRAMUSCULAR | Status: AC
  Filled 2024-07-10: qty 2

## 2024-07-10 MED ORDER — LIDOCAINE 2% (20 MG/ML) 5 ML SYRINGE
INTRAMUSCULAR | Status: AC
  Filled 2024-07-10: qty 30

## 2024-07-10 MED ORDER — SODIUM CHLORIDE (PF) 0.9 % IJ SOLN
INTRAMUSCULAR | Status: AC
Start: 2024-07-10 — End: 2024-07-10
  Filled 2024-07-10: qty 10

## 2024-07-10 MED ORDER — SODIUM CHLORIDE 0.9 % IV SOLN: 12.5000 mg | INTRAVENOUS | Status: DC | PRN

## 2024-07-10 MED ORDER — LACTATED RINGERS IV SOLN: INTRAVENOUS | Status: DC

## 2024-07-10 MED ORDER — SODIUM CHLORIDE (PF) 0.9 % IJ SOLN
INTRAMUSCULAR | Status: DC | PRN
  Administered 2024-07-10: 10 mL via INTRAVENOUS

## 2024-07-10 MED ORDER — ONDANSETRON HCL 4 MG/2ML IJ SOLN
INTRAMUSCULAR | Status: DC | PRN
  Administered 2024-07-10: 4 mg via INTRAVENOUS

## 2024-07-10 MED ORDER — FENTANYL CITRATE PF 50 MCG/ML IJ SOSY: 25.0000 ug | PREFILLED_SYRINGE | INTRAMUSCULAR | Status: DC | PRN

## 2024-07-10 MED ORDER — ROCURONIUM BROMIDE 10 MG/ML (PF) SYRINGE
PREFILLED_SYRINGE | INTRAVENOUS | Status: AC
  Filled 2024-07-10: qty 10

## 2024-07-10 MED ORDER — MIDAZOLAM HCL 2 MG/2ML IJ SOLN
INTRAMUSCULAR | Status: AC
  Filled 2024-07-10: qty 2

## 2024-07-10 MED ORDER — CHLORHEXIDINE GLUCONATE 0.12 % MT SOLN
15.0000 mL | Freq: Once | OROMUCOSAL | Status: AC
  Administered 2024-07-10: 15 mL via OROMUCOSAL

## 2024-07-10 MED ORDER — MIDAZOLAM HCL 2 MG/2ML IJ SOLN
INTRAMUSCULAR | Status: DC | PRN
Start: 2024-07-10 — End: 2024-07-10
  Administered 2024-07-10: 1 mg via INTRAVENOUS

## 2024-07-10 MED ORDER — CEFAZOLIN SODIUM-DEXTROSE 2-4 GM/100ML-% IV SOLN
2.0000 g | INTRAVENOUS | Status: AC
  Administered 2024-07-10: 2 g via INTRAVENOUS
  Filled 2024-07-10: qty 100

## 2024-07-10 MED ORDER — ORAL CARE MOUTH RINSE: 15.0000 mL | Freq: Once | OROMUCOSAL | Status: AC

## 2024-07-10 MED ORDER — SUCCINYLCHOLINE CHLORIDE 200 MG/10ML IV SOSY
PREFILLED_SYRINGE | INTRAVENOUS | Status: AC
  Filled 2024-07-10: qty 10

## 2024-07-10 MED ORDER — FENTANYL CITRATE (PF) 100 MCG/2ML IJ SOLN
INTRAMUSCULAR | Status: DC | PRN
  Administered 2024-07-10 (×4): 25 ug via INTRAVENOUS

## 2024-07-10 MED ORDER — OXYCODONE HCL 5 MG PO TABS: 5.0000 mg | ORAL_TABLET | Freq: Once | ORAL | Status: DC | PRN

## 2024-07-10 SURGICAL SUPPLY — 21 items
COVER BACK TABLE 60X90IN (DRAPES) ×1 IMPLANT
DRAPE LEGGINS SURG 28X43 STRL (DRAPES) ×1 IMPLANT
DRAPE U-SHAPE 47X51 STRL (DRAPES) ×1 IMPLANT
DRSG TEGADERM 4X10 (GAUZE/BANDAGES/DRESSINGS) ×1 IMPLANT
GAUZE SPONGE 4X4 12PLY STRL (GAUZE/BANDAGES/DRESSINGS) ×2 IMPLANT
GLOVE BIO SURGEON STRL SZ8 (GLOVE) ×1 IMPLANT
GLOVE BIOGEL PI IND STRL 7.0 (GLOVE) ×2 IMPLANT
GLOVE BIOGEL PI IND STRL 8 (GLOVE) ×1 IMPLANT
GOWN STRL REUS W/TWL LRG LVL3 (GOWN DISPOSABLE) ×1 IMPLANT
IMPL SPACEOAR VUE SYSTEM (Spacer) IMPLANT
KIT TURNOVER CYSTO (KITS) ×1 IMPLANT
MARKER GOLD PRELOAD 1.2X3 (Urological Implant) ×1 IMPLANT
MARKER SKIN DUAL TIP RULER LAB (MISCELLANEOUS) ×1 IMPLANT
PAD ARMBOARD POSITIONER FOAM (MISCELLANEOUS) ×2 IMPLANT
POSITIONER HEAD 8X9X4 ADT (SOFTGOODS) ×1 IMPLANT
SOL PREP POV-IOD 4OZ 10% (MISCELLANEOUS) ×1 IMPLANT
SYR 10ML LL (SYRINGE) ×1 IMPLANT
SYR CONTROL 10ML LL (SYRINGE) ×1 IMPLANT
TOWEL OR 17X26 4PK STRL BLUE (TOWEL DISPOSABLE) ×1 IMPLANT
UNDERPAD 30X36 HEAVY ABSORB (UNDERPADS AND DIAPERS) ×1 IMPLANT
WATER STERILE IRR 500ML POUR (IV SOLUTION) ×1 IMPLANT

## 2024-07-10 NOTE — H&P (Signed)
 HPI: Follow-up    1) Prostate cancer - diagnosised with unfavorable intermediate risk prostate cancer June 2025. H/o PSA elevation. PSA was 2 and 2022, 2.4 in 2023.  PSA rose to 3.9 in 2024.  His 12/21/2023 PSA was 9.1. No exam. No FH PCa. No biopsy. No h/o BPH.    Biopsy: June 2025 unfavorable intermediate risk prostate cancer PSA 9 T2b (right induration) Prostate 30 g GG 3, 4 cores, 20 to 90% GG 2, 4 cores, 20 to 90% 8/12 bilateral MSK LN, SV about 25%   2) BPH, LUTS - He voids with a slow stream. Urgency. Foot on the floor. IPSS 13. DRE/imaging with ~ 30 g prostate.    3) MH - on UA with 3-10 rbc May 2025. No gross hematuria. He smoked cigars ago but was around a lot chemicals in textiles.    Today, seen for prostate biopsy. His PSA remain elevated May 2025 PSA 8.7. No fever, dysuria or gross hematuria today.    Retired from [employer] in [place] - Apr 2025. Since 1997.    Today, seen for two reasons: 1) cysto to complete MH eval, 2) management of new diagnosis - Prostate cancer. His June 2025 CT-no lymphadenopathy or bone lesions reported. Cystoscopy benign today with borderline obs prostate and normal bladder neck.    PMH:     Past Medical History:  Diagnosis Date   Anxiety     Arthritis      all my joints (08/24/2017)   Diaphragmatic hernia without mention of obstruction or gangrene     Dysrhythmia      afib, ablation- 12/2015, no problems since, off blood thinner- 09/2016   Essential hypertension, benign     Headache      had MRI- was consulted with Dr. Jenel,, headache has resolvedprobably related to caffeine   Hyperplasia of prostate     Iron  deficiency anemia, unspecified     Malaise and fatigue     Obesity     Other and unspecified hyperlipidemia     Paroxysmal atrial fibrillation (HCC)     PONV (postoperative nausea and vomiting)     Unspecified hypothyroidism     Wears dentures            Surgical History:      Past Surgical History:  Procedure  Laterality Date   COLONOSCOPY WITH ESOPHAGOGASTRODUODENOSCOPY (EGD)       ELECTROPHYSIOLOGIC STUDY N/A 12/24/2015    Afib ablation by Dr Kelsie   JOINT REPLACEMENT       KNEE ARTHROSCOPY Right ~ 2016   LUNG BIOPSY   1990s    no problems identified   MULTIPLE TOOTH EXTRACTIONS       TOTAL KNEE ARTHROPLASTY Right 03/02/2017    Procedure: RIGHT TOTAL KNEE ARTHROPLASTY;  Surgeon: Kay Kemps, MD;  Location: [facility] OR;  Service: Orthopedics;  Laterality: Right;   TOTAL KNEE ARTHROPLASTY Left 08/24/2017   TOTAL KNEE ARTHROPLASTY Left 08/24/2017    Procedure: LEFT TOTAL KNEE ARTHROPLASTY;  Surgeon: Kay Kemps, MD;  Location: [facility] OR;  Service: Orthopedics;  Laterality: Left;          Home Medications:  Allergies as of 04/07/2024   No Known Allergies         Medication List           Accurate as of April 07, 2024 10:44 AM. If you have any questions, ask your nurse or doctor.  acetaminophen  500 MG tablet Commonly known as: TYLENOL  Take 500 mg by mouth in the morning and at bedtime.    allopurinol  100 MG tablet Commonly known as: ZYLOPRIM  Take 2 tablets (200 mg total) by mouth daily.    amLODipine  5 MG tablet Commonly known as: NORVASC  Take 1 tablet (5 mg total) by mouth daily.    aspirin  EC 81 MG tablet Take 81 mg by mouth daily.    hydrochlorothiazide  25 MG tablet Commonly known as: HYDRODIURIL  Take 1 tablet (25 mg total) by mouth daily.    iron  polysaccharides 150 MG capsule Commonly known as: Ferrex 150 Take 1 capsule (150 mg total) by mouth daily.    levofloxacin  750 MG tablet Commonly known as: LEVAQUIN  Take 750 mg by mouth once.    levothyroxine  200 MCG tablet Commonly known as: SYNTHROID  Take 1 tablet (200 mcg total) by mouth daily before breakfast.    olmesartan  40 MG tablet Commonly known as: BENICAR  Take 0.5 tablets (20 mg total) by mouth 2 (two) times daily.    rosuvastatin  5 MG tablet Commonly known as: CRESTOR  Take 1 tablet (5 mg  total) by mouth daily.    vitamin B-12 100 MCG tablet Commonly known as: CYANOCOBALAMIN  Take 100 mcg by mouth daily.    Vitamin D  (Cholecalciferol ) 25 MCG (1000 UT) Caps Take by mouth.             Allergies:  Allergies  No Known Allergies     Family History:      Family History  Problem Relation Age of Onset   Heart failure Mother     Hypertension Mother     Diabetes Mother     Lung cancer Father     COPD Father     Tuberous sclerosis Father     Heart disease Sister     Hypertension Sister     Thyroid  disease Sister     Crohn's disease Sister     Cancer Brother     Lung cancer Paternal Grandfather            Social History:  reports that he quit smoking about 31 years ago. His smoking use included cigars. He has never used smokeless tobacco. He reports that he does not drink alcohol and does not use drugs.     Physical Exam: BP 136/80   Pulse 65   NED. A&Ox3.   No respiratory distress   Abd soft, NT, ND Normal phallus with bilateral descended testicles, meatus and glans appear normal   Cystoscopy Procedure Note   Patient identification was confirmed, informed consent was obtained, and patient was prepped using Betadine solution.  Lidocaine  jelly was administered per urethral meatus.       Pre-Procedure: - Inspection reveals a normal caliber ureteral meatus.   Procedure: The flexible cystoscope was introduced without difficulty - No urethral strictures/lesions are present. - short borderline obs prostate  - normal bladder neck - Bilateral ureteral orifices identified - Bladder mucosa  reveals no ulcers, tumors, or lesions - No bladder stones - No trabeculation   Retroflexion shows normal bladder, bladder neck      Post-Procedure: - Patient tolerated the procedure well       Laboratory Data: Recent Labs       Lab Results  Component Value Date    WBC 8.1 12/21/2023    HGB 15.5 12/21/2023    HCT 46.1 12/21/2023    MCV 88 12/21/2023     PLT 218 12/21/2023  Recent Labs       Lab Results  Component Value Date    CREATININE 1.00 03/25/2024        Recent Labs       Lab Results  Component Value Date    PSA 0.8 11/15/2015        Recent Labs       Lab Results  Component Value Date    TESTOSTERONE  351 03/13/2016        Recent Labs       Lab Results  Component Value Date    HGBA1C 5.5 06/26/2023        Urinalysis Labs (Brief)          Component Value Date/Time    APPEARANCEUR Clear 02/11/2024 1143    GLUCOSEU Negative 02/11/2024 1143    BILIRUBINUR Negative 02/11/2024 1143    PROTEINUR Negative 02/11/2024 1143    UROBILINOGEN negative 09/22/2014 1631    NITRITE Negative 02/11/2024 1143    LEUKOCYTESUR Negative 02/11/2024 1143        Recent Labs       Lab Results  Component Value Date    LABMICR See below: 02/11/2024    WBCUA 0-5 02/11/2024    RBCUA None seen 10/07/2018    LABEPIT 0-10 02/11/2024    MUCUS neg 09/22/2014    BACTERIA None seen 02/11/2024        Pertinent Imaging: CT June 2025      Assessment & Plan:     1. BPH with obstruction/lower urinary tract symptoms (Primary) Cysto benign today. Short borderline obs prostate.  - Urinalysis, Routine w reflex microscopic - ciprofloxacin  (CIPRO ) tablet 500 mg - Cystoscopy   2. MH - benign eval.    3. PCa - I had a long discussion with the patient and his wife using his path report and the anatomical video board. We went his stage, grade and prognosis and the relevant anatomy. We discussed the nature risks and benefits of active surveillance, radical prostatectomy, IMRT (+/- brachytherapy, +/- ADT). We discussed specifically how each treatment might affect the bowel, bladder and sexual function. We discussed how each treatment might effect salvage treatments. We discussed the role of other modalities in the treatment of prostate cancer including chemotherapy, HIFU and cryotherapy. Discussed the nature r/b/a to gold seeds,  space oar and ADT with Eligard  and the role they play in external beam treatment. The risks/benefits/alternatives to fiducial markers and SpaceOAR was explained to the patient and he understands and wishes to proceed with surgery

## 2024-07-10 NOTE — Transfer of Care (Signed)
 Immediate Anesthesia Transfer of Care Note  Patient: Troy Acosta  Procedure(s) Performed: INSERTION, GOLD SEEDS (Prostate) INJECTION, HYDROGEL SPACER (Prostate)  Patient Location: PACU  Anesthesia Type:General  Level of Consciousness: drowsy  Airway & Oxygen  Therapy: Patient Spontanous Breathing and Patient connected to nasal cannula oxygen   Post-op Assessment: Report given to RN and Post -op Vital signs reviewed and stable  Post vital signs: Reviewed and stable  Last Vitals:  Vitals Value Taken Time  BP 107/71   Temp 97.5   Pulse 48 07/10/24 13:57  Resp 13 07/10/24 13:57  SpO2 99 % 07/10/24 13:57  Vitals shown include unfiled device data.  Last Pain:  Vitals:   07/10/24 1255  TempSrc: Oral  PainSc: 0-No pain         Complications: No notable events documented.

## 2024-07-10 NOTE — Anesthesia Preprocedure Evaluation (Signed)
 Anesthesia Evaluation  Patient identified by MRN, date of birth, ID band Patient awake    Reviewed: Allergy & Precautions, H&P , NPO status , Patient's Chart, lab work & pertinent test results, reviewed documented beta blocker date and time   History of Anesthesia Complications (+) PONV and history of anesthetic complications  Airway Mallampati: II  TM Distance: >3 FB Neck ROM: full    Dental  (+) Lower Dentures, Upper Dentures   Pulmonary neg pulmonary ROS, former smoker   Pulmonary exam normal breath sounds clear to auscultation       Cardiovascular hypertension, Normal cardiovascular exam+ dysrhythmias Atrial Fibrillation  Rhythm:regular Rate:Normal     Neuro/Psych  Headaches  Anxiety      Neuromuscular disease  negative psych ROS   GI/Hepatic negative GI ROS, Neg liver ROS,,,  Endo/Other  Hypothyroidism    Renal/GU negative Renal ROS  negative genitourinary   Musculoskeletal  (+) Arthritis , Osteoarthritis,    Abdominal   Peds  Hematology negative hematology ROS (+)   Anesthesia Other Findings   Reproductive/Obstetrics negative OB ROS                              Anesthesia Physical Anesthesia Plan  ASA: 3  Anesthesia Plan: General   Post-op Pain Management: Minimal or no pain anticipated   Induction: Intravenous  PONV Risk Score and Plan: Ondansetron  and Dexamethasone   Airway Management Planned: LMA  Additional Equipment: None  Intra-op Plan:   Post-operative Plan:   Informed Consent: I have reviewed the patients History and Physical, chart, labs and discussed the procedure including the risks, benefits and alternatives for the proposed anesthesia with the patient or authorized representative who has indicated his/her understanding and acceptance.     Dental Advisory Given  Plan Discussed with: CRNA  Anesthesia Plan Comments:         Anesthesia Quick  Evaluation

## 2024-07-10 NOTE — Op Note (Signed)
 PRE-OPERATIVE DIAGNOSIS:  Adenocarcinoma of the prostate   POST-OPERATIVE DIAGNOSIS:  Same   PROCEDURE: 1. Prostate Ultrasound 2. Placement of fiducial marks 3. Placement of SpaceOAR   SURGEON:  Surgeon(s): Belvie Clara, MD   ANESTHESIA:  General   EBL:  Minimal   DRAINS: none   FINDINGS: 39.9cc prostate on prostate US    INDICATION: Troy Acosta is a 73 year old with a history of T1c prostate cancer who is scheduled to undergo IMRT. He wishes to have fiducial markers and SpaceOAR placed prior to IMRT to decrease rectal toxicity.   Description of procedure: After informed consent the patient was brought to the major OR, placed on the table and administered general anesthesia. He was then moved to the modified lithotomy position with his perineum perpendicular to the floor. His perineum and genitalia were then sterilely prepped. An official timeout was then performed. The transrectal ultrasound probe was placed in the rectum and affixed to the stand. He was then sterilely draped.   A transrectal ultrasound of the prostate was performed.  Lidocaine   was not  instilled using ultrasound guidance into the junction of each seminal vesicle of the prostate.   3 Gold markers were placed into the prostate using the standard template and ultrasound guidance.  Accurate placement of the markers was confirmed.   We then proceeded to mix the SpaceOAR using the kit supplied from the manufacturer. Once this was complete we placed a sinal needle into the perirectal fat between the rectum and the prostate. Once this was accomplished we injected 2cc of normal saline to hydrodissect the plain. We then instilled the the SpaceOAR through the spinal needle and noted good distribution in the perirectal fat.    The patient was awakened and taken to recovery room in stable and satisfactory condition. He tolerated procedure well and there were no intraoperative complications.   CONDITION: Stable, extubated,  transferred to PACU   PLAN: The patient is to be discharged home and he will start IMRT in the next 2-3 weeks

## 2024-07-10 NOTE — Anesthesia Procedure Notes (Signed)
 Procedure Name: LMA Insertion Date/Time: 07/10/2024 1:32 PM  Performed by: Bluford Sedler, CRNAPre-anesthesia Checklist: Patient identified, Emergency Drugs available, Suction available and Patient being monitored Patient Re-evaluated:Patient Re-evaluated prior to induction Oxygen  Delivery Method: Circle system utilized Preoxygenation: Pre-oxygenation with 100% oxygen  Induction Type: IV induction LMA: LMA inserted LMA Size: 5.0 Number of attempts: 1 Placement Confirmation: positive ETCO2 and breath sounds checked- equal and bilateral Tube secured with: Tape Dental Injury: Teeth and Oropharynx as per pre-operative assessment

## 2024-07-10 NOTE — Anesthesia Postprocedure Evaluation (Signed)
 Anesthesia Post Note  Patient: Troy Acosta  Procedure(s) Performed: INSERTION, GOLD SEEDS (Prostate) INJECTION, HYDROGEL SPACER (Prostate)  Patient location during evaluation: PACU Anesthesia Type: General Level of consciousness: awake and alert Pain management: pain level controlled Vital Signs Assessment: post-procedure vital signs reviewed and stable Respiratory status: spontaneous breathing, nonlabored ventilation, respiratory function stable and patient connected to nasal cannula oxygen  Cardiovascular status: blood pressure returned to baseline and stable Postop Assessment: no apparent nausea or vomiting Anesthetic complications: no   There were no known notable events for this encounter.   Last Vitals:  Vitals:   07/10/24 1415 07/10/24 1433  BP: 128/81 122/78  Pulse: (!) 57 (!) 58  Resp: 18 18  Temp:  36.6 C  SpO2: 100% 99%    Last Pain:  Vitals:   07/10/24 1433  TempSrc: Oral  PainSc: 0-No pain                 Shawnika Pepin L Dearies Meikle

## 2024-07-14 DIAGNOSIS — X32XXXD Exposure to sunlight, subsequent encounter: Secondary | ICD-10-CM | POA: Diagnosis not present

## 2024-07-14 DIAGNOSIS — Z1283 Encounter for screening for malignant neoplasm of skin: Secondary | ICD-10-CM | POA: Diagnosis not present

## 2024-07-14 DIAGNOSIS — B078 Other viral warts: Secondary | ICD-10-CM | POA: Diagnosis not present

## 2024-07-14 DIAGNOSIS — L570 Actinic keratosis: Secondary | ICD-10-CM | POA: Diagnosis not present

## 2024-07-14 DIAGNOSIS — D225 Melanocytic nevi of trunk: Secondary | ICD-10-CM | POA: Diagnosis not present

## 2024-07-15 DIAGNOSIS — R7309 Other abnormal glucose: Secondary | ICD-10-CM | POA: Diagnosis not present

## 2024-07-15 DIAGNOSIS — E039 Hypothyroidism, unspecified: Secondary | ICD-10-CM | POA: Diagnosis not present

## 2024-07-15 DIAGNOSIS — I10 Essential (primary) hypertension: Secondary | ICD-10-CM | POA: Diagnosis not present

## 2024-07-15 DIAGNOSIS — E785 Hyperlipidemia, unspecified: Secondary | ICD-10-CM | POA: Diagnosis not present

## 2024-07-18 ENCOUNTER — Telehealth: Payer: Self-pay | Admitting: *Deleted

## 2024-07-18 NOTE — Telephone Encounter (Signed)
 CALLED PATIENT TO REMIND OF SIM AND MRI FOR 07-21-24- ARRIVAL TIME FOR SIM - 11:15 AM, INFORMED PATIENT TO ARRIVE WITH A FULL BLADDER, ARRIVAL TIME- 1:15 PM @ [facility] FOR MRI, SPOKE WITH PATIENT AND HE IS AWARE OF THESE APPTS. AND THE INSTRUCTIONS

## 2024-07-20 NOTE — Progress Notes (Signed)
  Radiation Oncology         [phone] ________________________________  Name: Edword Cu MRN: [id number]  Date: 07/21/2024  DOB: June 11, 1951  SIMULATION AND TREATMENT PLANNING NOTE    ICD-10-CM   1. Prostate cancer [facility])  C61       DIAGNOSIS:  73 y.o. gentleman with Stage T1c adenocarcinoma of the prostate with Gleason score of 4+3, and PSA of 8.7.   NARRATIVE:  The patient was brought to the CT Simulation planning suite.  Identity was confirmed.  All relevant records and images related to the planned course of therapy were reviewed.  The patient freely provided informed written consent to proceed with treatment after reviewing the details related to the planned course of therapy. The consent form was witnessed and verified by the simulation staff.  Then, the patient was set-up in a stable reproducible supine position for radiation therapy.  A vacuum lock pillow device was custom fabricated to position his legs in a reproducible immobilized position.  Then, I performed a urethrogram under sterile conditions to identify the prostatic apex.  CT images were obtained.  Surface markings were placed.  The CT images were loaded into the planning software.  Then the prostate target and avoidance structures including the rectum, bladder, bowel and hips were contoured.  Treatment planning then occurred.  The radiation prescription was entered and confirmed.  A total of one complex treatment devices was fabricated. I have requested : Intensity Modulated Radiotherapy (IMRT) is medically necessary for this case for the following reason:  Rectal sparing.SABRA  PLAN:  The patient will receive 70 Gy in 28 fractions.  ________________________________  Donnice FELIX Patrcia, M.D.

## 2024-07-21 ENCOUNTER — Ambulatory Visit (HOSPITAL_COMMUNITY)
Admission: RE | Admit: 2024-07-21 | Discharge: 2024-07-21 | Disposition: A | Discharge status: Home / Self Care | Source: Ambulatory Visit | Attending: Urology | Admitting: Urology

## 2024-07-21 ENCOUNTER — Ambulatory Visit
Admission: RE | Admit: 2024-07-21 | Discharge: 2024-07-21 | Disposition: A | Discharge status: Home / Self Care | Source: Ambulatory Visit | Attending: Radiation Oncology | Admitting: Radiation Oncology

## 2024-07-21 DIAGNOSIS — Z510 Encounter for antineoplastic radiation therapy: Secondary | ICD-10-CM | POA: Diagnosis present

## 2024-07-21 DIAGNOSIS — C61 Malignant neoplasm of prostate: Secondary | ICD-10-CM | POA: Insufficient documentation

## 2024-07-28 DIAGNOSIS — Z510 Encounter for antineoplastic radiation therapy: Secondary | ICD-10-CM | POA: Diagnosis not present

## 2024-07-30 ENCOUNTER — Ambulatory Visit: Admitting: Urology

## 2024-07-30 ENCOUNTER — Encounter: Payer: Self-pay | Admitting: Urology

## 2024-07-30 VITALS — BP 142/67 | HR 69

## 2024-07-30 DIAGNOSIS — C61 Malignant neoplasm of prostate: Secondary | ICD-10-CM

## 2024-07-30 DIAGNOSIS — N138 Other obstructive and reflux uropathy: Secondary | ICD-10-CM | POA: Diagnosis not present

## 2024-07-30 DIAGNOSIS — N401 Enlarged prostate with lower urinary tract symptoms: Secondary | ICD-10-CM | POA: Diagnosis not present

## 2024-07-30 DIAGNOSIS — R350 Frequency of micturition: Secondary | ICD-10-CM

## 2024-07-30 LAB — URINALYSIS, ROUTINE W REFLEX MICROSCOPIC
Bilirubin, UA: NEGATIVE
Glucose, UA: NEGATIVE
Ketones, UA: NEGATIVE
Leukocytes,UA: NEGATIVE
Nitrite, UA: NEGATIVE
Protein,UA: NEGATIVE
RBC, UA: NEGATIVE
Specific Gravity, UA: 1.01 (ref 1.005–1.030)
Urobilinogen, Ur: 0.2 mg/dL (ref 0.2–1.0)
pH, UA: 6 (ref 5.0–7.5)

## 2024-07-30 MED ORDER — ALFUZOSIN HCL ER 10 MG PO TB24: 10.0000 mg | ORAL_TABLET | Freq: Every day | ORAL | 11 refills | Status: AC

## 2024-07-30 NOTE — Patient Instructions (Signed)

## 2024-07-30 NOTE — Progress Notes (Signed)
 07/30/2024 2:58 PM   Troy Acosta September 13, 1951 [id number]  Referring provider: Dettinger, Fonda LABOR, MD [address] [place],  [place] [address]  Followup prostate cancer   HPI: Troy Acosta is a 72yo here for followup for prostate cancer and urinary frequency and nocturia. He is doing well after marker placement and SpaceOAR. He is scheduled to start radiation therapy next week. IPSS 24 QOL 5 on no BPh therapy. Uirne stream fair. He has intermittent straining to urinate. Nocturia 2-4x.    PMH: Past Medical History:  Diagnosis Date   Anxiety    Arthritis    all my joints (08/24/2017)   Diaphragmatic hernia without mention of obstruction or gangrene    Dysrhythmia    afib, ablation- 12/2015, no problems since, off blood thinner- 09/2016   Elevated PSA    Essential hypertension, benign    Headache    had MRI- was consulted with Dr. Jenel,, headache has resolvedprobably related to caffeine   Hyperplasia of prostate    Iron  deficiency anemia, unspecified    Malaise and fatigue    Obesity    Other and unspecified hyperlipidemia    Paroxysmal atrial fibrillation (HCC)    PONV (postoperative nausea and vomiting)    Unspecified hypothyroidism    Wears dentures     Surgical History: Past Surgical History:  Procedure Laterality Date   COLONOSCOPY WITH ESOPHAGOGASTRODUODENOSCOPY (EGD)     ELECTROPHYSIOLOGIC STUDY N/A 12/24/2015   Afib ablation by Dr Kelsie   GOLD SEED IMPLANT N/A 07/10/2024   Procedure: INSERTION, GOLD SEEDS;  Surgeon: Sherrilee Belvie CROME, MD;  Location: [facility];  Service: Urology;  Laterality: N/A;   JOINT REPLACEMENT     KNEE ARTHROSCOPY Right ~ 2016   LUNG BIOPSY  1990s   no problems identified   MULTIPLE TOOTH EXTRACTIONS     PROSTATE BIOPSY     SPACE OAR INSTILLATION N/A 07/10/2024   Procedure: INJECTION, HYDROGEL SPACER;  Surgeon: Sherrilee Belvie CROME, MD;  Location: [facility];  Service: Urology;  Laterality: N/A;   TOTAL KNEE ARTHROPLASTY Right 03/02/2017    Procedure: RIGHT TOTAL KNEE ARTHROPLASTY;  Surgeon: Kay Kemps, MD;  Location: [facility] OR;  Service: Orthopedics;  Laterality: Right;   TOTAL KNEE ARTHROPLASTY Left 08/24/2017   TOTAL KNEE ARTHROPLASTY Left 08/24/2017   Procedure: LEFT TOTAL KNEE ARTHROPLASTY;  Surgeon: Kay Kemps, MD;  Location: [facility] OR;  Service: Orthopedics;  Laterality: Left;    Home Medications:  Allergies as of 07/30/2024   No Known Allergies      Medication List        Accurate as of July 30, 2024  2:58 PM. If you have any questions, ask your nurse or doctor.          acetaminophen  500 MG tablet Commonly known as: TYLENOL  Take 500 mg by mouth in the morning and at bedtime.   allopurinol  100 MG tablet Commonly known as: ZYLOPRIM  Take 2 tablets (200 mg total) by mouth daily.   amLODipine  5 MG tablet Commonly known as: NORVASC  Take 1 tablet (5 mg total) by mouth daily.   aspirin  EC 81 MG tablet Take 81 mg by mouth daily.   hydrochlorothiazide  25 MG tablet Commonly known as: HYDRODIURIL  Take 1 tablet (25 mg total) by mouth daily.   iron  polysaccharides 150 MG capsule Commonly known as: Ferrex 150 Take 1 capsule (150 mg total) by mouth daily.   levothyroxine  200 MCG tablet Commonly known as: SYNTHROID  Take 1 tablet (200 mcg total) by mouth  daily before breakfast.   olmesartan  40 MG tablet Commonly known as: BENICAR  Take 0.5 tablets (20 mg total) by mouth 2 (two) times daily.   rosuvastatin  5 MG tablet Commonly known as: CRESTOR  Take 5 mg by mouth daily.   traMADol 50 MG tablet Commonly known as: Ultram Take 1 tablet (50 mg total) by mouth every 6 (six) hours as needed.   vitamin B-12 100 MCG tablet Commonly known as: CYANOCOBALAMIN  Take 100 mcg by mouth daily.   Vitamin D  (Cholecalciferol ) 25 MCG (1000 UT) Caps Take 1,000 Units by mouth daily.        Allergies: No Known Allergies  Family History: Family History  Problem Relation Age of Onset   Heart failure Mother     Hypertension Mother    Diabetes Mother    Lung cancer Father    COPD Father    Tuberous sclerosis Father    Heart disease Sister    Hypertension Sister    Thyroid  disease Sister    Crohn's disease Sister    Cancer Brother    Lung cancer Paternal Grandfather     Social History:  reports that he quit smoking about 31 years ago. His smoking use included cigars. He has never used smokeless tobacco. He reports that he does not drink alcohol and does not use drugs.  ROS: All other review of systems were reviewed and are negative except what is noted above in HPI  Physical Exam: BP (!) 142/67   Pulse 69   Constitutional:  Alert and oriented, No acute distress. HEENT: [place] AT, moist mucus membranes.  Trachea midline, no masses. Cardiovascular: No clubbing, cyanosis, or edema. Respiratory: Normal respiratory effort, no increased work of breathing. GI: Abdomen is soft, nontender, nondistended, no abdominal masses GU: No CVA tenderness.  Lymph: No cervical or inguinal lymphadenopathy. Skin: No rashes, bruises or suspicious lesions. Neurologic: Grossly intact, no focal deficits, moving all 4 extremities. Psychiatric: Normal mood and affect.  Laboratory Data: Lab Results  Component Value Date   WBC 10.8 (H) 07/07/2024   HGB 15.2 07/07/2024   HCT 43.6 07/07/2024   MCV 85.2 07/07/2024   PLT 178 07/07/2024    Lab Results  Component Value Date   CREATININE 0.90 07/07/2024    Lab Results  Component Value Date   PSA 0.8 11/15/2015    Lab Results  Component Value Date   TESTOSTERONE  351 03/13/2016    Lab Results  Component Value Date   HGBA1C 5.9 (H) 06/23/2024    Urinalysis    Component Value Date/Time   APPEARANCEUR Clear 04/07/2024 1034   GLUCOSEU Negative 04/07/2024 1034   BILIRUBINUR Negative 04/07/2024 1034   PROTEINUR Negative 04/07/2024 1034   UROBILINOGEN negative 09/22/2014 1631   NITRITE Negative 04/07/2024 1034   LEUKOCYTESUR Negative 04/07/2024 1034     Lab Results  Component Value Date   LABMICR See below: 04/07/2024   WBCUA None seen 04/07/2024   RBCUA None seen 10/07/2018   LABEPIT 0-10 04/07/2024   MUCUS neg 09/22/2014   BACTERIA None seen 04/07/2024    Pertinent Imaging:  No results found for this or any previous visit.  No results found for this or any previous visit.  No results found for this or any previous visit.  No results found for this or any previous visit.  No results found for this or any previous visit.  No results found for this or any previous visit.  No results found for this or any previous visit.  No  results found for this or any previous visit.   Assessment & Plan:    1. Prostate cancer (HCC) (Primary) Followup Feb for PSa and ADT - Urinalysis, Routine w reflex microscopic  2. BPH with obstruction/lower urinary tract symptoms Uroxatral 10mg  qhs  3. Urinary frequency We will start uroxatral 10mg  qhs   No follow-ups on file.  Belvie Clara, MD  [facility] [place]

## 2024-08-04 ENCOUNTER — Ambulatory Visit
Admission: RE | Admit: 2024-08-04 | Discharge: 2024-08-04 | Disposition: A | Source: Ambulatory Visit | Attending: Radiation Oncology

## 2024-08-04 ENCOUNTER — Other Ambulatory Visit: Payer: Self-pay

## 2024-08-04 DIAGNOSIS — Z510 Encounter for antineoplastic radiation therapy: Secondary | ICD-10-CM | POA: Diagnosis not present

## 2024-08-04 LAB — RAD ONC ARIA SESSION SUMMARY
Course Elapsed Days: 0
Plan Fractions Treated to Date: 1
Plan Prescribed Dose Per Fraction: 2.5 Gy
Plan Total Fractions Prescribed: 28
Plan Total Prescribed Dose: 70 Gy
Reference Point Dosage Given to Date: 2.5 Gy
Reference Point Session Dosage Given: 2.5 Gy
Session Number: 1

## 2024-08-05 ENCOUNTER — Ambulatory Visit
Admission: RE | Admit: 2024-08-05 | Discharge: 2024-08-05 | Disposition: A | Source: Ambulatory Visit | Attending: Radiation Oncology

## 2024-08-05 ENCOUNTER — Other Ambulatory Visit: Payer: Self-pay

## 2024-08-05 DIAGNOSIS — Z510 Encounter for antineoplastic radiation therapy: Secondary | ICD-10-CM | POA: Diagnosis not present

## 2024-08-05 LAB — RAD ONC ARIA SESSION SUMMARY
Course Elapsed Days: 1
Plan Fractions Treated to Date: 2
Plan Prescribed Dose Per Fraction: 2.5 Gy
Plan Total Fractions Prescribed: 28
Plan Total Prescribed Dose: 70 Gy
Reference Point Dosage Given to Date: 5 Gy
Reference Point Session Dosage Given: 2.5 Gy
Session Number: 2

## 2024-08-06 ENCOUNTER — Other Ambulatory Visit: Payer: Self-pay

## 2024-08-06 ENCOUNTER — Ambulatory Visit
Admission: RE | Admit: 2024-08-06 | Discharge: 2024-08-06 | Disposition: A | Discharge status: Home / Self Care | Source: Ambulatory Visit | Attending: Radiation Oncology | Admitting: Radiation Oncology

## 2024-08-06 DIAGNOSIS — Z510 Encounter for antineoplastic radiation therapy: Secondary | ICD-10-CM | POA: Diagnosis not present

## 2024-08-06 LAB — RAD ONC ARIA SESSION SUMMARY
Course Elapsed Days: 2
Plan Fractions Treated to Date: 3
Plan Prescribed Dose Per Fraction: 2.5 Gy
Plan Total Fractions Prescribed: 28
Plan Total Prescribed Dose: 70 Gy
Reference Point Dosage Given to Date: 7.5 Gy
Reference Point Session Dosage Given: 2.5 Gy
Session Number: 3

## 2024-08-07 ENCOUNTER — Ambulatory Visit
Admission: RE | Admit: 2024-08-07 | Discharge: 2024-08-07 | Disposition: A | Discharge status: Home / Self Care | Source: Ambulatory Visit | Attending: Radiation Oncology | Admitting: Radiation Oncology

## 2024-08-07 ENCOUNTER — Other Ambulatory Visit: Payer: Self-pay

## 2024-08-07 DIAGNOSIS — Z510 Encounter for antineoplastic radiation therapy: Secondary | ICD-10-CM | POA: Diagnosis not present

## 2024-08-07 LAB — RAD ONC ARIA SESSION SUMMARY
Course Elapsed Days: 3
Plan Fractions Treated to Date: 4
Plan Prescribed Dose Per Fraction: 2.5 Gy
Plan Total Fractions Prescribed: 28
Plan Total Prescribed Dose: 70 Gy
Reference Point Dosage Given to Date: 10 Gy
Reference Point Session Dosage Given: 2.5 Gy
Session Number: 4

## 2024-08-08 ENCOUNTER — Ambulatory Visit
Admission: RE | Admit: 2024-08-08 | Discharge: 2024-08-08 | Disposition: A | Discharge status: Home / Self Care | Source: Ambulatory Visit | Attending: Radiation Oncology | Admitting: Radiation Oncology

## 2024-08-08 ENCOUNTER — Other Ambulatory Visit: Payer: Self-pay

## 2024-08-08 DIAGNOSIS — Z510 Encounter for antineoplastic radiation therapy: Secondary | ICD-10-CM | POA: Diagnosis not present

## 2024-08-08 LAB — RAD ONC ARIA SESSION SUMMARY
Course Elapsed Days: 4
Plan Fractions Treated to Date: 5
Plan Prescribed Dose Per Fraction: 2.5 Gy
Plan Total Fractions Prescribed: 28
Plan Total Prescribed Dose: 70 Gy
Reference Point Dosage Given to Date: 12.5 Gy
Reference Point Session Dosage Given: 2.5 Gy
Session Number: 5

## 2024-08-11 ENCOUNTER — Ambulatory Visit
Admission: RE | Admit: 2024-08-11 | Discharge: 2024-08-11 | Disposition: A | Discharge status: Home / Self Care | Source: Ambulatory Visit | Attending: Radiation Oncology | Admitting: Radiation Oncology

## 2024-08-11 ENCOUNTER — Other Ambulatory Visit: Payer: Self-pay

## 2024-08-11 DIAGNOSIS — Z510 Encounter for antineoplastic radiation therapy: Secondary | ICD-10-CM | POA: Diagnosis present

## 2024-08-11 DIAGNOSIS — C61 Malignant neoplasm of prostate: Secondary | ICD-10-CM | POA: Diagnosis present

## 2024-08-11 LAB — RAD ONC ARIA SESSION SUMMARY
Course Elapsed Days: 7
Plan Fractions Treated to Date: 6
Plan Prescribed Dose Per Fraction: 2.5 Gy
Plan Total Fractions Prescribed: 28
Plan Total Prescribed Dose: 70 Gy
Reference Point Dosage Given to Date: 15 Gy
Reference Point Session Dosage Given: 2.5 Gy
Session Number: 6

## 2024-08-12 ENCOUNTER — Ambulatory Visit
Admission: RE | Admit: 2024-08-12 | Discharge: 2024-08-12 | Disposition: A | Discharge status: Home / Self Care | Source: Ambulatory Visit | Attending: Radiation Oncology | Admitting: Radiation Oncology

## 2024-08-12 ENCOUNTER — Other Ambulatory Visit: Payer: Self-pay

## 2024-08-12 DIAGNOSIS — Z510 Encounter for antineoplastic radiation therapy: Secondary | ICD-10-CM | POA: Diagnosis not present

## 2024-08-12 LAB — RAD ONC ARIA SESSION SUMMARY
Course Elapsed Days: 8
Plan Fractions Treated to Date: 7
Plan Prescribed Dose Per Fraction: 2.5 Gy
Plan Total Fractions Prescribed: 28
Plan Total Prescribed Dose: 70 Gy
Reference Point Dosage Given to Date: 17.5 Gy
Reference Point Session Dosage Given: 2.5 Gy
Session Number: 7

## 2024-08-13 ENCOUNTER — Other Ambulatory Visit: Payer: Self-pay

## 2024-08-13 ENCOUNTER — Ambulatory Visit
Admission: RE | Admit: 2024-08-13 | Discharge: 2024-08-13 | Disposition: A | Discharge status: Home / Self Care | Source: Ambulatory Visit | Attending: Radiation Oncology | Admitting: Radiation Oncology

## 2024-08-13 DIAGNOSIS — Z510 Encounter for antineoplastic radiation therapy: Secondary | ICD-10-CM | POA: Diagnosis not present

## 2024-08-13 LAB — RAD ONC ARIA SESSION SUMMARY
Course Elapsed Days: 9
Plan Fractions Treated to Date: 8
Plan Prescribed Dose Per Fraction: 2.5 Gy
Plan Total Fractions Prescribed: 28
Plan Total Prescribed Dose: 70 Gy
Reference Point Dosage Given to Date: 20 Gy
Reference Point Session Dosage Given: 2.5 Gy
Session Number: 8

## 2024-08-14 ENCOUNTER — Other Ambulatory Visit: Payer: Self-pay

## 2024-08-14 ENCOUNTER — Ambulatory Visit
Admission: RE | Admit: 2024-08-14 | Discharge: 2024-08-14 | Disposition: A | Source: Ambulatory Visit | Attending: Radiation Oncology

## 2024-08-14 DIAGNOSIS — Z510 Encounter for antineoplastic radiation therapy: Secondary | ICD-10-CM | POA: Diagnosis not present

## 2024-08-14 LAB — RAD ONC ARIA SESSION SUMMARY
Course Elapsed Days: 10
Plan Fractions Treated to Date: 9
Plan Prescribed Dose Per Fraction: 2.5 Gy
Plan Total Fractions Prescribed: 28
Plan Total Prescribed Dose: 70 Gy
Reference Point Dosage Given to Date: 22.5 Gy
Reference Point Session Dosage Given: 2.5 Gy
Session Number: 9

## 2024-08-15 ENCOUNTER — Other Ambulatory Visit: Payer: Self-pay

## 2024-08-15 ENCOUNTER — Ambulatory Visit
Admission: RE | Admit: 2024-08-15 | Discharge: 2024-08-15 | Disposition: A | Discharge status: Home / Self Care | Source: Ambulatory Visit | Attending: Radiation Oncology | Admitting: Radiation Oncology

## 2024-08-15 DIAGNOSIS — Z510 Encounter for antineoplastic radiation therapy: Secondary | ICD-10-CM | POA: Diagnosis not present

## 2024-08-15 LAB — RAD ONC ARIA SESSION SUMMARY
Course Elapsed Days: 11
Plan Fractions Treated to Date: 10
Plan Prescribed Dose Per Fraction: 2.5 Gy
Plan Total Fractions Prescribed: 28
Plan Total Prescribed Dose: 70 Gy
Reference Point Dosage Given to Date: 25 Gy
Reference Point Session Dosage Given: 2.5 Gy
Session Number: 10

## 2024-08-18 ENCOUNTER — Ambulatory Visit
Admission: RE | Admit: 2024-08-18 | Discharge: 2024-08-18 | Disposition: A | Discharge status: Home / Self Care | Source: Ambulatory Visit | Attending: Radiation Oncology | Admitting: Radiation Oncology

## 2024-08-18 ENCOUNTER — Other Ambulatory Visit: Payer: Self-pay

## 2024-08-18 DIAGNOSIS — Z510 Encounter for antineoplastic radiation therapy: Secondary | ICD-10-CM | POA: Diagnosis not present

## 2024-08-18 LAB — RAD ONC ARIA SESSION SUMMARY
Course Elapsed Days: 14
Plan Fractions Treated to Date: 11
Plan Prescribed Dose Per Fraction: 2.5 Gy
Plan Total Fractions Prescribed: 28
Plan Total Prescribed Dose: 70 Gy
Reference Point Dosage Given to Date: 27.5 Gy
Reference Point Session Dosage Given: 2.5 Gy
Session Number: 11

## 2024-08-19 ENCOUNTER — Ambulatory Visit
Admission: RE | Admit: 2024-08-19 | Discharge: 2024-08-19 | Disposition: A | Discharge status: Home / Self Care | Source: Ambulatory Visit | Attending: Radiation Oncology | Admitting: Radiation Oncology

## 2024-08-19 ENCOUNTER — Other Ambulatory Visit: Payer: Self-pay

## 2024-08-19 DIAGNOSIS — Z510 Encounter for antineoplastic radiation therapy: Secondary | ICD-10-CM | POA: Diagnosis not present

## 2024-08-19 LAB — RAD ONC ARIA SESSION SUMMARY
Course Elapsed Days: 15
Plan Fractions Treated to Date: 12
Plan Prescribed Dose Per Fraction: 2.5 Gy
Plan Total Fractions Prescribed: 28
Plan Total Prescribed Dose: 70 Gy
Reference Point Dosage Given to Date: 30 Gy
Reference Point Session Dosage Given: 2.5 Gy
Session Number: 12

## 2024-08-20 ENCOUNTER — Ambulatory Visit
Admission: RE | Admit: 2024-08-20 | Discharge: 2024-08-20 | Disposition: A | Discharge status: Home / Self Care | Source: Ambulatory Visit | Attending: Radiation Oncology | Admitting: Radiation Oncology

## 2024-08-20 ENCOUNTER — Other Ambulatory Visit: Payer: Self-pay

## 2024-08-20 DIAGNOSIS — Z510 Encounter for antineoplastic radiation therapy: Secondary | ICD-10-CM | POA: Diagnosis not present

## 2024-08-20 LAB — RAD ONC ARIA SESSION SUMMARY
Course Elapsed Days: 16
Plan Fractions Treated to Date: 13
Plan Prescribed Dose Per Fraction: 2.5 Gy
Plan Total Fractions Prescribed: 28
Plan Total Prescribed Dose: 70 Gy
Reference Point Dosage Given to Date: 32.5 Gy
Reference Point Session Dosage Given: 2.5 Gy
Session Number: 13

## 2024-08-21 ENCOUNTER — Ambulatory Visit
Admission: RE | Admit: 2024-08-21 | Discharge: 2024-08-21 | Disposition: A | Discharge status: Home / Self Care | Source: Ambulatory Visit | Attending: Radiation Oncology | Admitting: Radiation Oncology

## 2024-08-21 ENCOUNTER — Other Ambulatory Visit: Payer: Self-pay

## 2024-08-21 DIAGNOSIS — Z510 Encounter for antineoplastic radiation therapy: Secondary | ICD-10-CM | POA: Diagnosis not present

## 2024-08-21 LAB — RAD ONC ARIA SESSION SUMMARY
Course Elapsed Days: 17
Plan Fractions Treated to Date: 14
Plan Prescribed Dose Per Fraction: 2.5 Gy
Plan Total Fractions Prescribed: 28
Plan Total Prescribed Dose: 70 Gy
Reference Point Dosage Given to Date: 35 Gy
Reference Point Session Dosage Given: 2.5 Gy
Session Number: 14

## 2024-08-22 ENCOUNTER — Ambulatory Visit
Admission: RE | Admit: 2024-08-22 | Discharge: 2024-08-22 | Disposition: A | Discharge status: Home / Self Care | Source: Ambulatory Visit | Attending: Radiation Oncology | Admitting: Radiation Oncology

## 2024-08-22 ENCOUNTER — Ambulatory Visit

## 2024-08-22 ENCOUNTER — Other Ambulatory Visit: Payer: Self-pay

## 2024-08-22 DIAGNOSIS — Z510 Encounter for antineoplastic radiation therapy: Secondary | ICD-10-CM | POA: Diagnosis not present

## 2024-08-22 LAB — RAD ONC ARIA SESSION SUMMARY
Course Elapsed Days: 18
Plan Fractions Treated to Date: 15
Plan Prescribed Dose Per Fraction: 2.5 Gy
Plan Total Fractions Prescribed: 28
Plan Total Prescribed Dose: 70 Gy
Reference Point Dosage Given to Date: 37.5 Gy
Reference Point Session Dosage Given: 2.5 Gy
Session Number: 15

## 2024-08-25 ENCOUNTER — Ambulatory Visit
Admission: RE | Admit: 2024-08-25 | Discharge: 2024-08-25 | Disposition: A | Discharge status: Home / Self Care | Source: Ambulatory Visit | Attending: Radiation Oncology | Admitting: Radiation Oncology

## 2024-08-25 ENCOUNTER — Other Ambulatory Visit: Payer: Self-pay

## 2024-08-25 DIAGNOSIS — Z510 Encounter for antineoplastic radiation therapy: Secondary | ICD-10-CM | POA: Diagnosis not present

## 2024-08-25 LAB — RAD ONC ARIA SESSION SUMMARY
Course Elapsed Days: 21
Plan Fractions Treated to Date: 16
Plan Prescribed Dose Per Fraction: 2.5 Gy
Plan Total Fractions Prescribed: 28
Plan Total Prescribed Dose: 70 Gy
Reference Point Dosage Given to Date: 40 Gy
Reference Point Session Dosage Given: 2.5 Gy
Session Number: 16

## 2024-08-26 ENCOUNTER — Ambulatory Visit
Admission: RE | Admit: 2024-08-26 | Discharge: 2024-08-26 | Disposition: A | Source: Ambulatory Visit | Attending: Radiation Oncology

## 2024-08-26 ENCOUNTER — Other Ambulatory Visit: Payer: Self-pay

## 2024-08-26 DIAGNOSIS — Z510 Encounter for antineoplastic radiation therapy: Secondary | ICD-10-CM | POA: Diagnosis not present

## 2024-08-26 LAB — RAD ONC ARIA SESSION SUMMARY
Course Elapsed Days: 22
Plan Fractions Treated to Date: 17
Plan Prescribed Dose Per Fraction: 2.5 Gy
Plan Total Fractions Prescribed: 28
Plan Total Prescribed Dose: 70 Gy
Reference Point Dosage Given to Date: 42.5 Gy
Reference Point Session Dosage Given: 2.5 Gy
Session Number: 17

## 2024-08-27 ENCOUNTER — Ambulatory Visit
Admission: RE | Admit: 2024-08-27 | Discharge: 2024-08-27 | Disposition: A | Discharge status: Home / Self Care | Source: Ambulatory Visit | Attending: Radiation Oncology | Admitting: Radiation Oncology

## 2024-08-27 ENCOUNTER — Other Ambulatory Visit: Payer: Self-pay

## 2024-08-27 DIAGNOSIS — Z510 Encounter for antineoplastic radiation therapy: Secondary | ICD-10-CM | POA: Diagnosis not present

## 2024-08-27 LAB — RAD ONC ARIA SESSION SUMMARY
Course Elapsed Days: 23
Plan Fractions Treated to Date: 18
Plan Prescribed Dose Per Fraction: 2.5 Gy
Plan Total Fractions Prescribed: 28
Plan Total Prescribed Dose: 70 Gy
Reference Point Dosage Given to Date: 45 Gy
Reference Point Session Dosage Given: 2.5 Gy
Session Number: 18

## 2024-08-28 ENCOUNTER — Other Ambulatory Visit: Payer: Self-pay

## 2024-08-28 ENCOUNTER — Ambulatory Visit
Admission: RE | Admit: 2024-08-28 | Discharge: 2024-08-28 | Disposition: A | Source: Ambulatory Visit | Attending: Radiation Oncology

## 2024-08-28 DIAGNOSIS — Z510 Encounter for antineoplastic radiation therapy: Secondary | ICD-10-CM | POA: Diagnosis not present

## 2024-08-28 LAB — RAD ONC ARIA SESSION SUMMARY
Course Elapsed Days: 24
Plan Fractions Treated to Date: 19
Plan Prescribed Dose Per Fraction: 2.5 Gy
Plan Total Fractions Prescribed: 28
Plan Total Prescribed Dose: 70 Gy
Reference Point Dosage Given to Date: 47.5 Gy
Reference Point Session Dosage Given: 2.5 Gy
Session Number: 19

## 2024-08-29 ENCOUNTER — Ambulatory Visit
Admission: RE | Admit: 2024-08-29 | Discharge: 2024-08-29 | Disposition: A | Discharge status: Home / Self Care | Source: Ambulatory Visit | Attending: Radiation Oncology | Admitting: Radiation Oncology

## 2024-08-29 ENCOUNTER — Other Ambulatory Visit: Payer: Self-pay

## 2024-08-29 DIAGNOSIS — Z510 Encounter for antineoplastic radiation therapy: Secondary | ICD-10-CM | POA: Diagnosis not present

## 2024-08-29 LAB — RAD ONC ARIA SESSION SUMMARY
Course Elapsed Days: 25
Plan Fractions Treated to Date: 20
Plan Prescribed Dose Per Fraction: 2.5 Gy
Plan Total Fractions Prescribed: 28
Plan Total Prescribed Dose: 70 Gy
Reference Point Dosage Given to Date: 50 Gy
Reference Point Session Dosage Given: 2.5 Gy
Session Number: 20

## 2024-08-31 ENCOUNTER — Ambulatory Visit
Admission: RE | Admit: 2024-08-31 | Discharge: 2024-08-31 | Disposition: A | Discharge status: Home / Self Care | Source: Ambulatory Visit | Attending: Radiation Oncology | Admitting: Radiation Oncology

## 2024-08-31 ENCOUNTER — Other Ambulatory Visit: Payer: Self-pay

## 2024-08-31 DIAGNOSIS — Z510 Encounter for antineoplastic radiation therapy: Secondary | ICD-10-CM | POA: Diagnosis not present

## 2024-08-31 LAB — RAD ONC ARIA SESSION SUMMARY
Course Elapsed Days: 27
Plan Fractions Treated to Date: 21
Plan Prescribed Dose Per Fraction: 2.5 Gy
Plan Total Fractions Prescribed: 28
Plan Total Prescribed Dose: 70 Gy
Reference Point Dosage Given to Date: 52.5 Gy
Reference Point Session Dosage Given: 2.5 Gy
Session Number: 21

## 2024-09-01 ENCOUNTER — Other Ambulatory Visit: Payer: Self-pay

## 2024-09-01 ENCOUNTER — Ambulatory Visit
Admission: RE | Admit: 2024-09-01 | Discharge: 2024-09-01 | Disposition: A | Discharge status: Home / Self Care | Source: Ambulatory Visit | Attending: Radiation Oncology | Admitting: Radiation Oncology

## 2024-09-01 DIAGNOSIS — Z510 Encounter for antineoplastic radiation therapy: Secondary | ICD-10-CM | POA: Diagnosis not present

## 2024-09-01 LAB — RAD ONC ARIA SESSION SUMMARY
Course Elapsed Days: 28
Plan Fractions Treated to Date: 22
Plan Prescribed Dose Per Fraction: 2.5 Gy
Plan Total Fractions Prescribed: 28
Plan Total Prescribed Dose: 70 Gy
Reference Point Dosage Given to Date: 55 Gy
Reference Point Session Dosage Given: 2.5 Gy
Session Number: 22

## 2024-09-02 ENCOUNTER — Ambulatory Visit
Admission: RE | Admit: 2024-09-02 | Discharge: 2024-09-02 | Disposition: A | Source: Ambulatory Visit | Attending: Radiation Oncology

## 2024-09-02 ENCOUNTER — Other Ambulatory Visit: Payer: Self-pay

## 2024-09-02 DIAGNOSIS — Z510 Encounter for antineoplastic radiation therapy: Secondary | ICD-10-CM | POA: Diagnosis not present

## 2024-09-02 LAB — RAD ONC ARIA SESSION SUMMARY
Course Elapsed Days: 29
Plan Fractions Treated to Date: 23
Plan Prescribed Dose Per Fraction: 2.5 Gy
Plan Total Fractions Prescribed: 28
Plan Total Prescribed Dose: 70 Gy
Reference Point Dosage Given to Date: 57.5 Gy
Reference Point Session Dosage Given: 2.5 Gy
Session Number: 23

## 2024-09-03 ENCOUNTER — Ambulatory Visit
Admission: RE | Admit: 2024-09-03 | Discharge: 2024-09-03 | Disposition: A | Discharge status: Home / Self Care | Source: Ambulatory Visit | Attending: Radiation Oncology | Admitting: Radiation Oncology

## 2024-09-03 ENCOUNTER — Ambulatory Visit
Admission: RE | Admit: 2024-09-03 | Discharge: 2024-09-03 | Disposition: A | Source: Ambulatory Visit | Attending: Radiation Oncology

## 2024-09-03 ENCOUNTER — Other Ambulatory Visit: Payer: Self-pay

## 2024-09-03 DIAGNOSIS — Z510 Encounter for antineoplastic radiation therapy: Secondary | ICD-10-CM | POA: Diagnosis not present

## 2024-09-03 LAB — RAD ONC ARIA SESSION SUMMARY
Course Elapsed Days: 30
Plan Fractions Treated to Date: 24
Plan Prescribed Dose Per Fraction: 2.5 Gy
Plan Total Fractions Prescribed: 28
Plan Total Prescribed Dose: 70 Gy
Reference Point Dosage Given to Date: 60 Gy
Reference Point Session Dosage Given: 2.5 Gy
Session Number: 24

## 2024-09-08 ENCOUNTER — Ambulatory Visit
Admission: RE | Admit: 2024-09-08 | Discharge: 2024-09-08 | Disposition: A | Source: Ambulatory Visit | Attending: Radiation Oncology

## 2024-09-08 ENCOUNTER — Other Ambulatory Visit: Payer: Self-pay

## 2024-09-08 DIAGNOSIS — C61 Malignant neoplasm of prostate: Secondary | ICD-10-CM | POA: Diagnosis present

## 2024-09-08 DIAGNOSIS — Z510 Encounter for antineoplastic radiation therapy: Secondary | ICD-10-CM | POA: Insufficient documentation

## 2024-09-08 LAB — RAD ONC ARIA SESSION SUMMARY
Course Elapsed Days: 35
Plan Fractions Treated to Date: 25
Plan Prescribed Dose Per Fraction: 2.5 Gy
Plan Total Fractions Prescribed: 28
Plan Total Prescribed Dose: 70 Gy
Reference Point Dosage Given to Date: 62.5 Gy
Reference Point Session Dosage Given: 2.5 Gy
Session Number: 25

## 2024-09-09 ENCOUNTER — Other Ambulatory Visit: Payer: Self-pay

## 2024-09-09 ENCOUNTER — Ambulatory Visit
Admission: RE | Admit: 2024-09-09 | Discharge: 2024-09-09 | Disposition: A | Source: Ambulatory Visit | Attending: Radiation Oncology

## 2024-09-09 DIAGNOSIS — Z510 Encounter for antineoplastic radiation therapy: Secondary | ICD-10-CM | POA: Diagnosis not present

## 2024-09-09 LAB — RAD ONC ARIA SESSION SUMMARY
Course Elapsed Days: 36
Plan Fractions Treated to Date: 26
Plan Prescribed Dose Per Fraction: 2.5 Gy
Plan Total Fractions Prescribed: 28
Plan Total Prescribed Dose: 70 Gy
Reference Point Dosage Given to Date: 65 Gy
Reference Point Session Dosage Given: 2.5 Gy
Session Number: 26

## 2024-09-10 ENCOUNTER — Other Ambulatory Visit: Payer: Self-pay

## 2024-09-10 ENCOUNTER — Ambulatory Visit
Admission: RE | Admit: 2024-09-10 | Discharge: 2024-09-10 | Disposition: A | Source: Ambulatory Visit | Attending: Radiation Oncology

## 2024-09-10 DIAGNOSIS — Z510 Encounter for antineoplastic radiation therapy: Secondary | ICD-10-CM | POA: Diagnosis not present

## 2024-09-10 LAB — RAD ONC ARIA SESSION SUMMARY
Course Elapsed Days: 37
Plan Fractions Treated to Date: 27
Plan Prescribed Dose Per Fraction: 2.5 Gy
Plan Total Fractions Prescribed: 28
Plan Total Prescribed Dose: 70 Gy
Reference Point Dosage Given to Date: 67.5 Gy
Reference Point Session Dosage Given: 2.5 Gy
Session Number: 27

## 2024-09-11 ENCOUNTER — Other Ambulatory Visit: Payer: Self-pay

## 2024-09-11 ENCOUNTER — Ambulatory Visit
Admission: RE | Admit: 2024-09-11 | Discharge: 2024-09-11 | Disposition: A | Source: Ambulatory Visit | Attending: Radiation Oncology

## 2024-09-11 ENCOUNTER — Ambulatory Visit

## 2024-09-11 DIAGNOSIS — Z510 Encounter for antineoplastic radiation therapy: Secondary | ICD-10-CM | POA: Diagnosis not present

## 2024-09-11 LAB — RAD ONC ARIA SESSION SUMMARY
Course Elapsed Days: 38
Plan Fractions Treated to Date: 28
Plan Prescribed Dose Per Fraction: 2.5 Gy
Plan Total Fractions Prescribed: 28
Plan Total Prescribed Dose: 70 Gy
Reference Point Dosage Given to Date: 70 Gy
Reference Point Session Dosage Given: 2.5 Gy
Session Number: 28

## 2024-09-12 ENCOUNTER — Ambulatory Visit

## 2024-09-12 NOTE — Radiation Completion Notes (Signed)
 Patient Name: Troy Acosta, Troy Acosta MRN: [id number] Date of Birth: [birth year] Referring Physician: DONNICE BROOKS, M.D. Date of Service: 2024-09-12 Radiation Oncologist: Adina Barge, M.D. [facility]                             RADIATION ONCOLOGY END OF TREATMENT NOTE     Diagnosis: C61 Malignant neoplasm of prostate Staging on 2024-03-10: Prostate cancer (HCC) T=cT2a, N=cN0, M=cM0 Staging on 2024-03-10: Malignant neoplasm of prostate (HCC) T=cT1c, N=cN0, M=cM0 Intent: Curative     ==========DELIVERED PLANS==========  First Treatment Date: 2024-08-04 Last Treatment Date: 2024-09-11   Plan Name: Prostate Site: Prostate Technique: IMRT Mode: Photon Dose Per Fraction: 2.5 Gy Prescribed Dose (Delivered / Prescribed): 70 Gy / 70 Gy Prescribed Fxs (Delivered / Prescribed): 28 / 28     ==========ON TREATMENT VISIT DATES========== 2024-08-07, 2024-08-15, 2024-08-21, 2024-08-29, 2024-09-03, 2024-09-11     ==========UPCOMING VISITS========== 12/01/2024 [facility] [place] OFFICE VISIT Sherrilee Belvie CROME, MD  11/25/2024 [facility] [place] [facility]        ==========APPENDIX - ON TREATMENT VISIT NOTES==========   See weekly On Treatment Notes in Epic for details in the Media tab (listed as Progress notes on the On Treatment Visit Dates listed above).

## 2024-09-18 NOTE — Progress Notes (Signed)
 Patient was a RadOnc Consult on 04/29/24 for his stage T1c adenocarcinoma of the prostate with Gleason score of 4+3, and PSA of 8.7. Patient proceed with treatment recommendations of ADT and IMRT and had his final radiation treatment on 09/11/24.   Patient will be scheduled for a post treatment nurse call and has his first post treatment PSA on 11/25/2024 followed by follow up with Dr. Sherrilee on 12/01/24.

## 2024-09-29 NOTE — Progress Notes (Signed)
" °  Radiation Oncology         [phone] ________________________________  Name: Troy Acosta MRN: [id number]  Date of Service: 10/13/2024  DOB: 1950/12/15  Post Treatment Telephone Note  Diagnosis:  Malignant neoplasm of prostate   First Treatment Date: 2024-08-04 Last Treatment Date: 2024-09-11   Plan Name: Prostate Site: Prostate Technique: IMRT Mode: Photon Dose Per Fraction: 2.5 Gy Prescribed Dose (Delivered / Prescribed): 70 Gy / 70 Gy Prescribed Fxs (Delivered / Prescribed): 28 / 28   Pre Treatment IPSS Score: 10 (as documented in the provider consult note)  The patient was available for call today.   Symptoms of fatigue have not improved since completing therapy.  Symptoms of bladder changes have not improved since completing therapy. Current symptoms include frequency, urgency and nocturia. Medications for urinary symptoms include alfuzosin .  Symptoms of bowel changes have improved since completing therapy.   Post Treatment IPSS Score: IPSS Questionnaire (AUA-7): Over the past month   1)  How often have you had a sensation of not emptying your bladder completely after you finish urinating?  3 - About half the time  2)  How often have you had to urinate again less than two hours after you finished urinating? 3 - About half the time  3)  How often have you found you stopped and started again several times when you urinated?  0 - Not at all  4) How difficult have you found it to postpone urination?  3 - About half the time  5) How often have you had a weak urinary stream?  4 - More than half the time  6) How often have you had to push or strain to begin urination?  0 - Not at all  7) How many times did you most typically get up to urinate from the time you went to bed until the time you got up in the morning?  4 - 4 times  Total score:  17. Which indicates moderate symptoms  0-7 mildly symptomatic   8-19 moderately symptomatic   20-35 severely symptomatic   Patient  has a scheduled follow up visit with his urologist, Dr. Sherrilee, in mid February for ongoing surveillance. He was counseled that PSA levels will be drawn in the urology office, and was reassured that additional time is expected to improve bowel and bladder symptoms. He was encouraged to call back with concerns or questions regarding radiation.   "

## 2024-10-13 ENCOUNTER — Ambulatory Visit
Admission: RE | Admit: 2024-10-13 | Discharge: 2024-10-13 | Disposition: A | Discharge status: Home / Self Care | Source: Ambulatory Visit | Attending: Radiation Oncology | Admitting: Radiation Oncology

## 2024-10-13 DIAGNOSIS — C61 Malignant neoplasm of prostate: Secondary | ICD-10-CM

## 2024-10-30 ENCOUNTER — Ambulatory Visit: Admitting: Nurse Practitioner

## 2024-10-30 ENCOUNTER — Encounter: Payer: Self-pay | Admitting: Nurse Practitioner

## 2024-10-30 ENCOUNTER — Ambulatory Visit: Payer: Self-pay

## 2024-10-30 VITALS — BP 146/79 | HR 63 | Temp 97.4°F | Ht 71.0 in | Wt 252.0 lb

## 2024-10-30 DIAGNOSIS — J40 Bronchitis, not specified as acute or chronic: Secondary | ICD-10-CM | POA: Diagnosis not present

## 2024-10-30 MED ORDER — AZITHROMYCIN 250 MG PO TABS: ORAL_TABLET | ORAL | 0 refills | Status: AC

## 2024-10-30 NOTE — Patient Instructions (Signed)

## 2024-10-30 NOTE — Telephone Encounter (Signed)
 FYI Only or Action Required?: FYI only for provider: appointment scheduled on 10/30/2024.  Patient was last seen in primary care on 06/23/2024 by Dettinger, Fonda LABOR, MD.  Called Nurse Triage reporting Cough and Nasal Congestion.  Symptoms began several days ago.  Interventions attempted: Nothing.  Symptoms are: gradually improving.  Triage Disposition: Home Care  Patient/caregiver understands and will follow disposition?: Yes  Message from [facility] E sent at 10/30/2024 11:53 AM EST  Summary: Possible sinus infection   Reason for Triage: Retelling when coughing Possible sinus infection  09/11/24 last day of radiation  prostate cancer         Reason for Disposition  Common cold with no complications  Answer Assessment - Initial Assessment Questions 1. ONSET: When did the nasal discharge start?      6 days 2. AMOUNT: How much discharge is there?      moderate 3. COUGH: Do you have a cough? If Yes, ask: Describe the color of your mucus. (e.g., clear, white, yellow, green)     Rattly cough with occasional clear phlegm 4. RESPIRATORY DISTRESS: Describe your breathing.      normal 5. FEVER: Do you have a fever? If Yes, ask: What is your temperature, how was it measured, and when did it start?     denies 6. SEVERITY: Overall, how bad are you feeling right now? (e.g., doesn't interfere with normal activities, staying home from school/work, staying in bed)      Condition is improving, but is not  completely well.  7. OTHER SYMPTOMS: Do you have any other symptoms? (e.g., earache, mouth sores, sore throat, wheezing)     Concerned for pneumonia, would like chest xray  Protocols used: Common Cold-A-AH

## 2024-10-30 NOTE — Progress Notes (Signed)
 "  Subjective:    Patient ID: Troy Acosta, male    DOB: 06-Dec-1950, 74 y.o.   MRN: [id number]   Chief Complaint: Cough and Nasal Congestion (For 6-7 days)   Patient has history of  pneumonia. Just finished radition for prostate cancer  Cough This is a new problem. The current episode started in the past 7 days. The problem has been gradually improving. The problem occurs every few minutes. The cough is Productive of sputum. Associated symptoms include rhinorrhea. Pertinent negatives include no chills, ear congestion, fever, nasal congestion or shortness of breath. Nothing aggravates the symptoms. Treatments tried: nyquil. The treatment provided mild relief.    Patient Active Problem List   Diagnosis Date Noted   Malignant neoplasm of prostate (HCC) 04/26/2024   S/P TKR (total knee replacement), left 08/24/2017   Status post total knee replacement, right 03/02/2017   Chronic intractable headache 08/09/2016   Non-ischemic cardiomyopathy (HCC) 11/07/2015   Long term (current) use of anticoagulants 11/07/2015   PAF (paroxysmal atrial fibrillation) (HCC) 07/15/2015   Heart palpitations 07/11/2015   Hypothyroidism    Arteritis, unspecified    Iron  deficiency anemia, unspecified    Obesity    Prostate cancer (HCC)    Essential hypertension    Hyperlipidemia        Review of Systems  Constitutional:  Negative for chills and fever.  HENT:  Positive for congestion (mild) and rhinorrhea.   Respiratory:  Positive for cough. Negative for shortness of breath.        Objective:   Physical Exam Constitutional:      Appearance: Normal appearance. He is obese.  Cardiovascular:     Rate and Rhythm: Normal rate and regular rhythm.     Heart sounds: Normal heart sounds.  Pulmonary:     Effort: Pulmonary effort is normal.     Breath sounds: Normal breath sounds.     Comments: Course Breath sounds Neurological:     General: No focal deficit present.     Mental Status: He is alert and  oriented to person, place, and time.  Psychiatric:        Mood and Affect: Mood normal.        Behavior: Behavior normal.    BP (!) 146/79   Pulse 63   Temp (!) 97.4 F (36.3 C) (Temporal)   Ht 5' 11 (1.803 m)   Wt 252 lb (114.3 kg)   SpO2 99%   BMI 35.15 kg/m         Assessment & Plan:  Troy Acosta in today with chief complaint of Cough and Nasal Congestion (For 6-7 days)   1. Bronchitis (Primary) 1. Take meds as prescribed 2. Use a cool mist humidifier especially during the winter months and when heat has been humid. 3. Use saline nose sprays frequently 4. Saline irrigations of the nose can be very helpful if done frequently.  * 4X daily for 1 week*  * Use of a nettie pot can be helpful with this. Follow directions with this* 5. Drink plenty of fluids 6. Keep thermostat turn down low 7.For any cough or congestion- continue nyquil 8. For fever or aces or pains- take tylenol  or ibuprofen appropriate for age and weight.  * for fevers greater than 101 orally you may alternate ibuprofen and tylenol  every  3 hours.   Meds ordered this encounter  Medications   azithromycin  (ZITHROMAX  Z-PAK) 250 MG tablet    Sig: As directed    Dispense:  6  tablet    Refill:  0    Supervising Provider:   MARYANNE CHEW A [[id number]]       The above assessment and management plan was discussed with the patient. The patient verbalized understanding of and has agreed to the management plan. Patient is aware to call the clinic if symptoms persist or worsen. Patient is aware when to return to the clinic for a follow-up visit. Patient educated on when it is appropriate to go to the emergency department.   Mary-Margaret Gladis, FNP    "

## 2024-11-25 ENCOUNTER — Other Ambulatory Visit

## 2024-12-01 ENCOUNTER — Ambulatory Visit: Admitting: Urology

## 2024-12-22 ENCOUNTER — Encounter: Payer: Self-pay | Admitting: Family Medicine
